# Patient Record
Sex: Female | Born: 1950
Health system: Southern US, Community
[De-identification: ages and names within clinical notes are randomized; demographics above are authoritative.]

## PROBLEM LIST (undated history)

## (undated) DIAGNOSIS — I272 Pulmonary hypertension, unspecified: Secondary | ICD-10-CM

## (undated) DIAGNOSIS — M069 Rheumatoid arthritis, unspecified: Secondary | ICD-10-CM

## (undated) DIAGNOSIS — R112 Nausea with vomiting, unspecified: Secondary | ICD-10-CM

## (undated) DIAGNOSIS — J449 Chronic obstructive pulmonary disease, unspecified: Secondary | ICD-10-CM

## (undated) DIAGNOSIS — M1990 Unspecified osteoarthritis, unspecified site: Secondary | ICD-10-CM

## (undated) DIAGNOSIS — T8859XA Other complications of anesthesia, initial encounter: Secondary | ICD-10-CM

## (undated) DIAGNOSIS — I10 Essential (primary) hypertension: Secondary | ICD-10-CM

## (undated) DIAGNOSIS — T4145XA Adverse effect of unspecified anesthetic, initial encounter: Secondary | ICD-10-CM

## (undated) DIAGNOSIS — Z9889 Other specified postprocedural states: Secondary | ICD-10-CM

## (undated) HISTORY — DX: Pulmonary hypertension, unspecified: I27.20

## (undated) HISTORY — DX: Essential (primary) hypertension: I10

## (undated) HISTORY — PX: TUBAL LIGATION: SHX77

## (undated) HISTORY — DX: Rheumatoid arthritis, unspecified: M06.9

---

## 2003-07-05 HISTORY — PX: LEEP: SHX91

## 2004-07-04 HISTORY — PX: OTHER SURGICAL HISTORY: SHX169

## 2005-07-04 HISTORY — PX: OTHER SURGICAL HISTORY: SHX169

## 2006-08-22 ENCOUNTER — Ambulatory Visit: Payer: Self-pay | Admitting: Family Medicine

## 2006-10-04 ENCOUNTER — Encounter: Payer: Self-pay | Admitting: Family Medicine

## 2006-10-24 ENCOUNTER — Ambulatory Visit: Payer: Self-pay | Admitting: Cardiology

## 2006-11-16 DIAGNOSIS — M069 Rheumatoid arthritis, unspecified: Secondary | ICD-10-CM | POA: Insufficient documentation

## 2006-11-16 DIAGNOSIS — I10 Essential (primary) hypertension: Secondary | ICD-10-CM | POA: Insufficient documentation

## 2006-11-16 DIAGNOSIS — M810 Age-related osteoporosis without current pathological fracture: Secondary | ICD-10-CM

## 2006-11-30 ENCOUNTER — Ambulatory Visit: Payer: Self-pay | Admitting: Family Medicine

## 2006-12-12 ENCOUNTER — Encounter: Payer: Self-pay | Admitting: Family Medicine

## 2007-03-28 ENCOUNTER — Encounter: Payer: Self-pay | Admitting: Family Medicine

## 2007-03-28 ENCOUNTER — Other Ambulatory Visit: Admission: RE | Admit: 2007-03-28 | Discharge: 2007-03-28 | Payer: Self-pay | Admitting: Family Medicine

## 2007-03-28 ENCOUNTER — Ambulatory Visit: Payer: Self-pay | Admitting: Family Medicine

## 2007-03-28 LAB — CONVERTED CEMR LAB
HDL: 69.4 mg/dL (ref >39.0)
KOH Prep: NEGATIVE
VLDL: 11 mg/dL (ref 0–40)
Whiff Test: NEGATIVE

## 2007-04-02 ENCOUNTER — Encounter (INDEPENDENT_AMBULATORY_CARE_PROVIDER_SITE_OTHER): Payer: Self-pay | Admitting: *Deleted

## 2007-04-02 LAB — CONVERTED CEMR LAB: Pap Smear: NORMAL

## 2007-04-03 ENCOUNTER — Encounter (INDEPENDENT_AMBULATORY_CARE_PROVIDER_SITE_OTHER): Payer: Self-pay | Admitting: *Deleted

## 2007-04-19 ENCOUNTER — Encounter: Payer: Self-pay | Admitting: Family Medicine

## 2007-05-01 ENCOUNTER — Encounter: Payer: Self-pay | Admitting: Family Medicine

## 2007-05-05 HISTORY — PX: OTHER SURGICAL HISTORY: SHX169

## 2007-05-08 ENCOUNTER — Encounter: Payer: Self-pay | Admitting: Family Medicine

## 2007-05-08 LAB — HM COLONOSCOPY: HM Colonoscopy: NORMAL

## 2007-05-18 ENCOUNTER — Ambulatory Visit: Payer: Self-pay | Admitting: Gastroenterology

## 2007-07-23 ENCOUNTER — Telehealth (INDEPENDENT_AMBULATORY_CARE_PROVIDER_SITE_OTHER): Payer: Self-pay | Admitting: *Deleted

## 2007-09-06 ENCOUNTER — Encounter: Payer: Self-pay | Admitting: Family Medicine

## 2007-09-12 ENCOUNTER — Encounter (INDEPENDENT_AMBULATORY_CARE_PROVIDER_SITE_OTHER): Payer: Self-pay | Admitting: *Deleted

## 2007-09-12 ENCOUNTER — Encounter: Payer: Self-pay | Admitting: Family Medicine

## 2007-11-07 ENCOUNTER — Telehealth: Payer: Self-pay | Admitting: Family Medicine

## 2008-02-07 ENCOUNTER — Encounter: Payer: Self-pay | Admitting: Family Medicine

## 2008-05-05 ENCOUNTER — Other Ambulatory Visit: Admission: RE | Admit: 2008-05-05 | Discharge: 2008-05-05 | Payer: Self-pay | Admitting: Family Medicine

## 2008-05-05 ENCOUNTER — Encounter: Payer: Self-pay | Admitting: Family Medicine

## 2008-05-05 ENCOUNTER — Ambulatory Visit: Payer: Self-pay | Admitting: Family Medicine

## 2008-05-07 LAB — CONVERTED CEMR LAB
ALT: 21 units/L (ref 0–35)
AST: 25 units/L (ref 0–37)
Basophils Relative: 0.9 % (ref 0.0–3.0)
CO2: 33 meq/L — ABNORMAL HIGH (ref 19–32)
Calcium: 10.3 mg/dL (ref 8.4–10.5)
Chloride: 102 meq/L (ref 96–112)
Creatinine, Ser: 0.9 mg/dL (ref 0.4–1.2)
Eosinophils Relative: 2.8 % (ref 0.0–5.0)
Glucose, Bld: 93 mg/dL (ref 70–99)
Hemoglobin: 15.7 g/dL — ABNORMAL HIGH (ref 12.0–15.0)
Lymphocytes Relative: 35 % (ref 12.0–46.0)
Monocytes Relative: 8.8 % (ref 3.0–12.0)
Neutro Abs: 2.8 10*3/uL (ref 1.4–7.7)
Neutrophils Relative %: 52.5 % (ref 43.0–77.0)
RBC: 4.59 M/uL (ref 3.87–5.11)
TSH: 1.17 microintl units/mL (ref 0.35–5.50)
Total Protein: 7.3 g/dL (ref 6.0–8.3)
Vit D, 1,25-Dihydroxy: 38 (ref 30–89)
WBC: 5.6 10*3/uL (ref 4.5–10.5)

## 2008-07-21 ENCOUNTER — Ambulatory Visit: Payer: Self-pay | Admitting: Family Medicine

## 2008-08-08 ENCOUNTER — Encounter: Payer: Self-pay | Admitting: Family Medicine

## 2008-08-28 ENCOUNTER — Telehealth: Payer: Self-pay | Admitting: Family Medicine

## 2008-09-30 ENCOUNTER — Encounter: Payer: Self-pay | Admitting: Family Medicine

## 2008-10-15 ENCOUNTER — Encounter: Payer: Self-pay | Admitting: Family Medicine

## 2009-01-07 ENCOUNTER — Telehealth: Payer: Self-pay | Admitting: Family Medicine

## 2009-02-06 ENCOUNTER — Encounter: Payer: Self-pay | Admitting: Family Medicine

## 2010-03-15 ENCOUNTER — Ambulatory Visit: Payer: Self-pay | Admitting: Family Medicine

## 2010-03-15 DIAGNOSIS — K648 Other hemorrhoids: Secondary | ICD-10-CM

## 2010-04-06 ENCOUNTER — Encounter: Payer: Self-pay | Admitting: Family Medicine

## 2010-04-13 ENCOUNTER — Encounter: Payer: Self-pay | Admitting: Family Medicine

## 2010-05-12 ENCOUNTER — Telehealth: Payer: Self-pay | Admitting: Family Medicine

## 2010-05-19 ENCOUNTER — Telehealth (INDEPENDENT_AMBULATORY_CARE_PROVIDER_SITE_OTHER): Payer: Self-pay | Admitting: *Deleted

## 2010-05-20 ENCOUNTER — Ambulatory Visit: Payer: Self-pay | Admitting: Family Medicine

## 2010-05-23 LAB — CONVERTED CEMR LAB
Alkaline Phosphatase: 54 units/L (ref 39–117)
BUN: 11 mg/dL (ref 6–23)
Basophils Absolute: 0 10*3/uL (ref 0.0–0.1)
Basophils Relative: 0.7 % (ref 0.0–3.0)
Bilirubin, Direct: 0.1 mg/dL (ref 0.0–0.3)
CO2: 29 meq/L (ref 19–32)
Calcium: 9.5 mg/dL (ref 8.4–10.5)
Chloride: 100 meq/L (ref 96–112)
Cholesterol: 187 mg/dL (ref 0–200)
Creatinine, Ser: 0.9 mg/dL (ref 0.4–1.2)
Eosinophils Absolute: 0.1 10*3/uL (ref 0.0–0.7)
HDL: 72.5 mg/dL (ref >39.00)
LDL Cholesterol: 105 mg/dL — ABNORMAL HIGH (ref 0–99)
Lymphocytes Relative: 23.7 % (ref 12.0–46.0)
MCHC: 34.5 g/dL (ref 30.0–36.0)
MCV: 96.2 fL (ref 78.0–100.0)
Monocytes Absolute: 0.4 10*3/uL (ref 0.1–1.0)
Neutrophils Relative %: 64.3 % (ref 43.0–77.0)
Platelets: 175 10*3/uL (ref 150.0–400.0)
RBC: 4.49 M/uL (ref 3.87–5.11)
Total Bilirubin: 0.4 mg/dL (ref 0.3–1.2)
Total CHOL/HDL Ratio: 3
Triglycerides: 47 mg/dL (ref 0.0–149.0)
VLDL: 9.4 mg/dL (ref 0.0–40.0)
WBC: 5 10*3/uL (ref 4.5–10.5)

## 2010-06-08 ENCOUNTER — Encounter: Payer: Self-pay | Admitting: Family Medicine

## 2010-06-08 ENCOUNTER — Ambulatory Visit: Payer: Self-pay | Admitting: Family Medicine

## 2010-06-11 ENCOUNTER — Other Ambulatory Visit
Admission: RE | Admit: 2010-06-11 | Discharge: 2010-06-11 | Payer: Self-pay | Source: Home / Self Care | Admitting: Family Medicine

## 2010-06-11 ENCOUNTER — Ambulatory Visit: Payer: Self-pay | Admitting: Family Medicine

## 2010-06-11 LAB — CONVERTED CEMR LAB
Cholesterol, target level: 200 mg/dL
HDL goal, serum: 40 mg/dL

## 2010-06-14 ENCOUNTER — Encounter: Payer: Self-pay | Admitting: Family Medicine

## 2010-06-14 ENCOUNTER — Telehealth: Payer: Self-pay | Admitting: Family Medicine

## 2010-06-14 ENCOUNTER — Encounter (INDEPENDENT_AMBULATORY_CARE_PROVIDER_SITE_OTHER): Payer: Self-pay | Admitting: *Deleted

## 2010-06-17 DIAGNOSIS — R87610 Atypical squamous cells of undetermined significance on cytologic smear of cervix (ASC-US): Secondary | ICD-10-CM

## 2010-06-17 LAB — CONVERTED CEMR LAB: Pap Smear: UNDETERMINED

## 2010-07-26 ENCOUNTER — Encounter: Payer: Self-pay | Admitting: Family Medicine

## 2010-08-03 NOTE — Progress Notes (Signed)
Summary: needs order for mammogram  Phone Note Call from Patient   Caller: Patient Call For: Allena Earing MD Summary of Call: Pt has appt to see you 11/22 and is asking for an order for mammogram either before or after appt.  She goes to [facility]. Initial call taken by: Marty Heck CMA, AAMA,  May 12, 2010 12:28 PM  Follow-up for Phone Call        let her know that we are making decision no longer to use [facility] due to problems getting reports and some other issues  Rosaria Ferries will disc other choices with her will do ref  Follow-up by: Allena Earing MD,  May 12, 2010 1:08 PM  Additional Follow-up for Phone Call Additional follow up Details #1::        Left message for patient to call back. Ozzie Hoyle LPN  May 12, 1024 4:53 PM   I got message from Rosaria Ferries - that we need dx instead of screen mam - I will change the order     Additional Follow-up for Phone Call Additional follow up Details #2::    Appt at [facility] on 06/08/2010 at 9:30, order faxed and mailed to the patient. Follow-up by: Haynes Bast,  May 13, 2010 2:54 PM

## 2010-08-03 NOTE — Letter (Signed)
Summary: [facility]  [facility]   Imported By: Edmonia James 04/22/2010 12:27:15  _____________________________________________________________________  External Attachment:    Type:   Image     Comment:   External Document

## 2010-08-03 NOTE — Progress Notes (Signed)
----   Converted from flag ---- ---- 05/18/2010 11:06 PM, Carmell Austria Tower MD wrote: please check lipid and wellness v70.0, 401.1 thanks   ---- 05/18/2010 8:04 AM, Daralene Milch CMA (AAMA) wrote: Lab orders please! Good Morning! This pt is scheduled for cpx labs [place], which labs to draw and dx codes to use? Thanks Tasha ------------------------------

## 2010-08-03 NOTE — Letter (Signed)
Summary: [facility]  [facility]   Imported By: Edmonia James 02/18/2009 14:41:49  _____________________________________________________________________  External Attachment:    Type:   Image     Comment:   External Document

## 2010-08-03 NOTE — Assessment & Plan Note (Signed)
Summary: BLOOD IN STOOL   Vital Signs:  Patient profile:   60 year old female Height:      66 inches Weight:      120 pounds BMI:     19.44 Temp:     97.9 degrees F oral Pulse rate:   72 / minute Pulse rhythm:   regular BP sitting:   138 / 82  (left arm) Cuff size:   regular  Vitals Entered By: Ozzie Hoyle LPN (March 15, 6225 12:47 PM) CC: Bright red blood and mucus in loose stools.   History of Present Illness: is having problems with blood and mucous in stools  sometimes when she has a bm -- is solid and other times is diarrhea and a lot of mucous and brb  1-2 weeks ago  no nausea or fever  a bit of stomach pain for 2 days --was over on the L side -- gas ex relieved it   has not eaten out a lot  no one else at home has had it   blood is small amt bright red -- just with bm and occ with gas just little streaks on the stool and when she wipes (not coloring toilet water red)  has hx of hemorroids -- they are popped out   used to volunteer in hospital 3 mo ago   stressed -- lost job for several mo and 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 scleroderma  Past Surgical History: Last updated: 05/24/2007 Tubal ligation LEEP (2005) Surgery- tendon release in hand (2006) Dexa- OP hip -3.0 (07/2005) colonosc neg 11/08  Family History: Last updated: 05/05/2008 Father: RA, macular degeneration, HTN-? etoh, early demtentia  Mother: Heart disease- deceased. 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 Eyes:  Denies blurring and eye pain. CV:  Denies chest pain or discomfort, lightheadness, near fainting, and palpitations. Resp:  Denies cough, shortness of breath, sputum productive, and wheezing. GI:  Complains of abdominal pain, bloody stools, change in bowel habits, and gas; denies indigestion, loss of appetite, nausea, and vomiting. GU:  Denies hematuria. Derm:  Denies itching, poor wound healing, and rash. Neuro:  Denies headaches. Endo:  Denies cold intolerance and heat intolerance. Heme:  Denies abnormal bruising and bleeding.  Physical Exam  General:  slim and well appearing  Head:  normocephalic, atraumatic, and no abnormalities observed.   Eyes:  vision grossly intact, pupils equal, pupils round, and pupils reactive to light.  no conjunctival pallor, injection or icterus  Mouth:  pharynx pink and moist.   Neck:  No deformities, masses, or tenderness noted. Lungs:  Normal respiratory effort, chest expands symmetrically. Lungs are clear to auscultation, no crackles or wheezes. Heart:  Normal rate and regular rhythm. S1 and S2 normal without gallop, murmur, click, rub or other extra sounds. Abdomen:  Bowel sounds positive,abdomen soft and non-tender without masses, organomegaly or hernias noted. Rectal:  nl tone with heme neg stool int hemorrhoid with mild proctitis at 1-2 :00 position on  anoscopy (non thrombosed and not actively bleeding )  Msk:  No deformity or scoliosis noted of thoracic or lumbar spine.   Skin:  Intact without suspicious lesions or rashes Cervical Nodes:  No lymphadenopathy noted Inguinal Nodes:  No significant adenopathy Psych:  normal affect, talkative and pleasant    Impression & Recommendations:  Problem # 1:  HEMORRHOIDS, INTERNAL, WITH BLEEDING (ICD-455.2) Assessment New  brb in stool after straining - streaks on anoscopy non thrombosed / non bleeding  int hem seen tx with anusol hc suppos  update if not imp  heme neg stool today utd colonosc also adv probiotic and adv   Orders: Prescription Created Electronically [phone]) Anoscopy (16010)  Problem # 2:  HYPERTENSION (ICD-401.9) Assessment: Unchanged  this remains in fair control with meds disc good health habits  refils done Her updated medication list for this problem includes:    Cardizem Cd 180 Mg Cp24 (Diltiazem hcl coated beads) .Marland Kitchen... Take one by mouth daily    Hydrochlorothiazide 25 Mg Tabs (Hydrochlorothiazide) .Marland Kitchen... Take one by mouth daily  BP today: 138/82 Prior BP: 120/80 (07/21/2008)  Labs Reviewed: K+: 3.6 (05/05/2008) Creat: : 0.9 (05/05/2008)   Chol: 185 (03/28/2007)   HDL: 69.4 (03/28/2007)   LDL: 104 (03/28/2007)   TG: 57 (03/28/2007)  Orders: Prescription Created Electronically [phone])  Complete Medication List: 1)  Enbrel 25 Mg Kit (Etanercept) .Marland Kitchen.. 1 injection  per week 2)  Cardizem Cd 180 Mg Cp24 (Diltiazem hcl coated beads) .... Take one by mouth daily 3)  Hydrochlorothiazide 25 Mg Tabs (Hydrochlorothiazide) .... Take one by mouth daily 4)  Hydroxychloroquine Sulfate 200 Mg Tabs (Hydroxychloroquine sulfate) .... One by mouth daily 5)  Adult Aspirin Low Strength 81 Mg Tbdp (Aspirin) .... One by mouth daily 6)  Vitamin D3 2000 Unit Caps (Cholecalciferol) .... Take 1 capsule by mouth once a day 7)  Gas-x 80 Mg Chew (Simethicone) .... Otc as directed. 8)  Anusol-hc 25 Mg Supp (Hydrocortisone acetate) .Marland Kitchen.. 1 suppository per rectum at bedtime for 10 days  Patient Instructions: 1)  buy align otc and take as directed for 2 weeks (probiotic)  2)  update me if mucous in stool does not improve  3)  use the anusol hc suppository at bedtime for 10 days 4)  udpate me if bleeding continues 5)  avoid straining  Prescriptions: HYDROCHLOROTHIAZIDE 25 MG TABS (HYDROCHLOROTHIAZIDE) Take one by mouth daily  #90 x 3   Entered and Authorized by:   Allena Earing MD    Signed by:   Allena Earing MD on 03/15/2010   Method used:   Print then Give to Patient   RxID:   [id number] CARDIZEM CD 180 MG CP24 (DILTIAZEM HCL COATED BEADS) Take one by mouth daily  #90 x 3   Entered and Authorized by:   Allena Earing MD   Signed by:   Allena Earing MD on 03/15/2010   Method used:   Print then Give to Patient   RxID:   [id number] ANUSOL-HC 25 MG SUPP (HYDROCORTISONE ACETATE) 1 suppository per rectum at bedtime for 10 days  #10 x 1   Entered and Authorized by:   Allena Earing MD   Signed by:   Allena Earing MD on 03/15/2010   Method used:   Electronically to        [facility]. [phone]* (retail)       [place]  [place], [place]  [address]       Ph: [phone]       Fax: [phone]   RxID:   [id number]   Current Allergies (reviewed today): No known allergies

## 2010-08-03 NOTE — Miscellaneous (Signed)
Summary: refil fosamax  Clinical Lists Changes  Medications: Rx of FOSAMAX 70 MG TABS (ALENDRONATE SODIUM) Take one by mouth weekly;  #3 month x 3;  Signed;  Entered by: Allena Earing MD;  Authorized by: Carmell Austria Kadejah Sandiford MD;  Method used: Printed then faxed to    Prescriptions: FOSAMAX 70 MG TABS (ALENDRONATE SODIUM) Take one by mouth weekly  #3 month x 3   Entered and Authorized by:   Allena Earing MD   Signed by:   Allena Earing MD on 02/07/2008   Method used:   Printed then faxed to ...         RxID:   [id number]

## 2010-08-03 NOTE — Consult Note (Signed)
Summary: Consultation Report-rheumatology  Consultation Report   Imported By: Genice Rouge 12/21/2006 15:50:18  _____________________________________________________________________  External Attachment:    Type:   Image     Comment:   External Document

## 2010-08-05 NOTE — Miscellaneous (Signed)
  Clinical Lists Changes  Observations: Added new observation of MAMMO DUE: 06/2011 (06/14/2010 13:44) Added new observation of MAMMOGRAM: normal (06/08/2010 13:45)      Preventive Care Screening  Mammogram:    Date:  06/08/2010    Next Due:  06/2011    Results:  normal

## 2010-08-05 NOTE — Assessment & Plan Note (Signed)
Summary: CPX/CLE  r/s 05/25/10   Vital Signs:  Patient profile:   60 year old female Height:      66 inches Weight:      119.75 pounds BMI:     19.40 Temp:     97.9 degrees F oral Pulse rate:   72 / minute Pulse rhythm:   regular BP sitting:   130 / 78  (left arm) Cuff size:   regular  Vitals Entered By: Ozzie Hoyle LPN (June 12, 9023 12:18 PM) CC: CPX with pap and breast exam LMP 15 yrs ago, Lipid Management   History of Present Illness: here for wellness and gyn exam and to rev chronic problems  wt is stable  feeling ok in general  no new medical changes  bmi is 19  still takes align for ibs - and this seems to help -- will continue this    HTN is fairly controlled with bp of 130/70 today   hx of leep in past last pap 09  no gyn problems   colonosc 08 nl  12/11 nl mam -- result just returned  self exam - no lumps or changes   has OP dexa 09- due for 2 y check-- had issues with insurance company -- finally will pay  ca and D  labs -gluc was 108 no thirst  no wt loss  does crave some sweets but does not eat them  some DM on mothers side of family    lipids stable Last Lipid ProfileCholesterol: 187 (05/20/2010 8:28:40 AM)HDL:  72.50 (05/20/2010 8:28:40 AM)LDL:  105 (05/20/2010 8:28:40 AM)Triglycerides:  Last Liver profileSGOT:  20 (05/20/2010 8:28:40 AM)SPGT:  14 (05/20/2010 8:28:40 AM)T. Bili:  0.4 (05/20/2010 8:28:40 AM)Alk Phos:  54 (05/20/2010 8:28:40 AM)     Lipid Management History:      Positive NCEP/ATP III risk factors include female age 39 years old or older and hypertension.  Negative NCEP/ATP III risk factors include HDL cholesterol greater than 60 and non-tobacco-user status.    Allergies (verified): No Known Drug Allergies  Past History:  Past Medical History: Last updated: 03/28/2007 Hypertension Osteoporosis Rheumatoid arthritis- ? scleroderma  Past Surgical History: Last updated: 05/24/2007 Tubal ligation LEEP  (2005) Surgery- tendon release in hand (2006) Dexa- OP hip -3.0 (07/2005) colonosc neg 11/08  Family History: Last updated: 05/05/2008 Father: RA, macular degeneration, HTN-? etoh, early demtentia  Mother: Heart disease- deceased. 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 Eyes:  Denies blurring and eye irritation. CV:  Denies chest pain or discomfort, palpitations, and shortness of breath with exertion. Resp:  Denies cough and shortness of breath. GI:  Denies abdominal pain, bloody stools, change in bowel habits, and indigestion; IBS is a little better . GU:  Denies urinary frequency. MS:  Denies joint pain, joint redness, and joint swelling. Derm:  Denies itching, lesion(s), poor wound healing, and rash. Neuro:  Denies numbness and tingling. Endo:  Denies cold intolerance, excessive thirst, excessive urination, and heat intolerance. Heme:  Denies abnormal bruising and bleeding.  Physical Exam  General:  Well-developed,well-nourished,in no acute distress; alert,appropriate and cooperative throughout examination Head:  normocephalic, atraumatic, and no abnormalities observed.   Eyes:  vision grossly intact, pupils equal, pupils  round, and pupils reactive to light.  no conjunctival pallor, injection or icterus  Ears:  R ear normal and L ear normal.   Nose:  no nasal discharge.   Mouth:  pharynx pink and moist.   Neck:  No deformities, masses, or tenderness noted. Chest Wall:  No deformities, masses, or tenderness noted. Breasts:  No mass, nodules, thickening, tenderness, bulging, retraction, inflamation, nipple discharge or skin changes noted.   Lungs:  Normal respiratory  effort, chest expands symmetrically. Lungs are clear to auscultation, no crackles or wheezes. Heart:  Normal rate and regular rhythm. S1 and S2 normal without gallop, murmur, click, rub or other extra sounds. Abdomen:  Bowel sounds positive,abdomen soft and non-tender without masses, organomegaly or hernias noted. no renal bruits  Genitalia:  Normal introitus for age, no external lesions, no vaginal discharge, mucosa pink and moist, no vaginal or cervical lesions, no vaginal atrophy, no friaility or hemorrhage, normal uterus size and position, no adnexal masses or tenderness Msk:  No deformity or scoliosis noted of thoracic or lumbar spine.  no acute joint changes  Pulses:  R and L carotid,radial,femoral,dorsalis pedis and posterior tibial pulses are full and equal bilaterally Extremities:  No clubbing, cyanosis, edema, or deformity noted with normal full range of motion of all joints.   Neurologic:  sensation intact to light touch, sensation intact to pinprick, and gait normal.   Skin:  Intact without suspicious lesions or rashes Cervical Nodes:  No lymphadenopathy noted Axillary Nodes:  No palpable lymphadenopathy Inguinal Nodes:  No significant adenopathy Psych:  normal affect, talkative and pleasant    Impression & Recommendations:  Problem # 1:  HEALTH MAINTENANCE EXAM (ICD-V70.0) Assessment Comment Only reviewed health habits including diet, exercise and skin cancer prevention reviewed health maintenance list and family history reviewed labs with pt in detail Td today  Problem # 2:  GYNECOLOGICAL EXAMINATION, ROUTINE (ICD-V72.31) Assessment: Comment Only annual exam with pap done and no complaints  Problem # 3:  HYPERTENSION (ICD-401.9) Assessment: Unchanged  bp is well controlled  no change in med rev labs rev lifestyle habits Her updated medication list for this problem includes:    Cardizem Cd 180 Mg Cp24 (Diltiazem hcl coated beads) .Marland Kitchen... Take one by mouth daily     Hydrochlorothiazide 25 Mg Tabs (Hydrochlorothiazide) .Marland Kitchen... Take one by mouth daily  BP today: 130/78 Prior BP: 138/82 (03/15/2010)  Labs Reviewed: K+: 3.8 (05/20/2010) Creat: : 0.9 (05/20/2010)   Chol: 187 (05/20/2010)   HDL: 72.50 (05/20/2010)   LDL: 105 (05/20/2010)   TG: 47.0 (05/20/2010)  Problem # 4:  OSTEOPOROSIS (ICD-733.00) Assessment: Unchanged due for dexa - will schedule continue ca and D mild kyphosis  work on exercise Her updated medication list for this problem includes:    Vitamin D3 2000 Unit Caps (Cholecalciferol) .Marland Kitchen... Take 1 capsule by mouth once a day  Orders: Radiology Referral (Radiology)  Complete Medication List: 1)  Enbrel 25 Mg Kit (Etanercept) .Marland Kitchen.. 1 injection  per week 2)  Cardizem Cd 180 Mg Cp24 (Diltiazem hcl coated beads) .... Take one by mouth daily 3)  Hydrochlorothiazide 25 Mg Tabs (Hydrochlorothiazide) .... Take one by mouth daily 4)  Hydroxychloroquine Sulfate 200 Mg Tabs (Hydroxychloroquine sulfate) .... One by mouth daily 5)  Adult Aspirin Low Strength 81 Mg Tbdp (Aspirin) .... One by mouth daily 6)  Vitamin D3 2000 Unit Caps (Cholecalciferol) .... Take 1 capsule by mouth once a day 7)  Gas-x 80 Mg Chew (Simethicone) .... Otc as directed. 8)  Anusol-hc 25 Mg Supp (Hydrocortisone acetate) .Marland Kitchen.. 1 suppository per rectum at bedtime for 10 days as needed  Other Orders: TD Toxoids IM 7 YR + (18550) Admin 1st Vaccine (15868)  Lipid Assessment/Plan:      Based on NCEP/ATP III, the patient's risk factor category is "0-1 risk factors".  The patient's lipid goals are as follows: Total cholesterol goal is 200; LDL cholesterol goal is 160; HDL cholesterol goal is 40; Triglyceride goal is 150.     Patient Instructions: 1)  tetnus shot today  2)  labs look ok but watch sugar in your diet  3)  we will schedule bone density test at check out  4)  no change in medicine   Orders Added: 1)  TD Toxoids IM 7 YR + [90714] 2)  Admin 1st Vaccine  [90471] 3)  Radiology Referral [Radiology] 4)  Est. Patient 40-64 years [25749]   Immunization History:  Influenza Immunization History:    Influenza:  historical (04/03/2010)  Immunizations Administered:  Tetanus Vaccine:    Vaccine Type: Td    Site: left deltoid    Mfr: Sanofi Pasteur    Dose: 0.5 ml    Route: IM    Given by: Ozzie Hoyle LPN    Exp. Date: 10/21/2011    Lot #: [id number]    VIS given: 05/21/08 version given June 11, 2010.   Immunization History:  Influenza Immunization History:    Influenza:  Historical (04/03/2010)  Immunizations Administered:  Tetanus Vaccine:    Vaccine Type: Td    Site: left deltoid    Mfr: Sanofi Pasteur    Dose: 0.5 ml    Route: IM    Given by: Ozzie Hoyle LPN    Exp. Date: 10/21/2011    Lot #: [id number]    VIS given: 05/21/08 version given June 11, 2010.  Current Allergies (reviewed today): No known allergies    Preventive Care Screening  Mammogram:    Date:  06/08/2010    Results:  normal

## 2010-08-05 NOTE — Letter (Signed)
Summary: Results Follow up Letter  [facility]  [address] [place], [place] [address]   Phone: [phone]  Fax: [phone]    06/14/2010 MRN: [id number]  Carrie Hendricks [address] [place],   [address]  Dear [name],  The following are the results of your recent test(s):  Test         Result    Pap Smear:        Normal _____  Not Normal _____ Comments: ______________________________________________________ Cholesterol: LDL(Bad cholesterol):         Your goal is less than:         HDL (Good cholesterol):       Your goal is more than: Comments:  ______________________________________________________ Mammogram:        Normal __X__  Not Normal _____ Comments:Repeat in 1 year  ___________________________________________________________________ Hemoccult:        Normal _____  Not normal _______ Comments:    _____________________________________________________________________ Other Tests:    We routinely do not discuss normal results over the telephone.  If you desire a copy of the results, or you have any questions about this information we can discuss them at your next office visit.   Sincerely,      Sheldon Silvan for  Dr. Loura Pardon

## 2010-08-05 NOTE — Progress Notes (Signed)
  Phone Note Call from Patient   Caller: Patient Summary of Call: Called patient to set up Dexa Scan. She wants to go thru [facility] and she wants Dr Precious Reel to order it for her. I removed the order.  Initial call taken by: Haynes Bast,  June 14, 2010 8:19 AM  Follow-up for Phone Call        thanks for the update- that is fine Follow-up by: Allena Earing MD,  June 14, 2010 8:47 AM

## 2010-08-19 NOTE — Letter (Signed)
Summary: [facility]  [facility]   Imported By: Laural Benes 08/06/2010 13:58:36  _____________________________________________________________________  External Attachment:    Type:   Image     Comment:   External Document

## 2010-08-23 ENCOUNTER — Encounter: Payer: Self-pay | Admitting: Family Medicine

## 2010-09-06 ENCOUNTER — Telehealth: Payer: Self-pay | Admitting: Family Medicine

## 2010-09-14 NOTE — Letter (Signed)
Summary: [facility]   Imported By: Jamelle Haring 08/31/2010 10:12:50  _____________________________________________________________________  External Attachment:    Type:   Image     Comment:   External Document

## 2010-09-14 NOTE — Progress Notes (Signed)
Summary: call a nurse   Phone Note Call from Patient   Caller: Patient Summary of Call: Triage Record Num: [id number] Operator: Hughes Better Patient Name: Carrie Hendricks Call Date & Time: 09/04/2010 2:07:32PM Patient Phone: [phone] PCP: Wynelle Fanny. Stormi Vandevelde Patient Gender: Female PCP Fax : Patient DOB: 11/21/1948 Practice Name: [facility] Reason for Call: Follow up call: RN spoke to Jennifer/Daughter reports that patient is not vomiting. RN spoke to patient and she is able to tolerate ice chips. Patient has tolerated Phenergan supp x 1. Patient reports no nausea and "feels" much better. RN advised patient to call back anytime. Protocol(s) Used: Office Note Recommended Outcome per Protocol: Information Noted and Sent to Office Reason for Outcome: Caller information to office Care Advice:  ~ 03/ Initial call taken by: Lacretia Nicks,  September 06, 2010 8:29 AM

## 2010-11-18 ENCOUNTER — Other Ambulatory Visit: Payer: Self-pay

## 2010-11-18 MED ORDER — HYDROCHLOROTHIAZIDE 25 MG PO TABS: 25.0000 mg | ORAL_TABLET | Freq: Every day | ORAL | Status: DC

## 2010-11-18 NOTE — Telephone Encounter (Signed)
Completed [employer] form faxed to [phone] as instructed.

## 2010-11-19 NOTE — Assessment & Plan Note (Signed)
[facility] OFFICE NOTE   ADISA, VIGEANT                      MRN:          [id number]  DATE:10/24/2006                            DOB:          [birth year]    ADDENDUM:  Review of Dr. Scharlene Gloss followup notes suggest that she has  positive scleroderma antibody and anti-CCP antibodies.  She also had a  positive FANA.  Question scleroderma or mixed connective tissue disorder  has been raised concerning her hands and acrocyanosis.  I agree with a  calcium channel blocker, to which he has changed her.     Thomas C. Verl Blalock, MD, [facility]     TCW/MedQ  DD: 10/24/2006  DT: 10/24/2006  Job #: [id number]   cc:   Percell Boston  Dr. Theodosia Blender A. [name], MD

## 2010-11-19 NOTE — Assessment & Plan Note (Signed)
[facility] OFFICE NOTE   Carrie Hendricks, Carrie Hendricks                      MRN:          [id number]  DATE:10/24/2006                            DOB:          20-Sep-1950    HISTORY OF PRESENT ILLNESS:  I was asked by Dr. [name] to evaluate Carrie Hendricks, a delightful 60 year old divorced white female, recently new  to the area from [place] with dyspnea on exertion.   She noted this back in the fall, but this screen, with increased  activity, it has been worse.  She denies any chest tightness, pressure  or symptoms otherwise of ischemia.   She has a heavy smoking history, having smoked for 35 years which  equates to about a 50-pack year smoking history at a pack and a half a  day.  She quit in August 2007.   She has also been plagued by some allergic symptoms since she moved back  to the [place].   She has been evaluated by Dr. Cristi Loron who has followed her in  the distant past for rheumatoid arthritis.  He saw her on several  occasions lasting October 04, 2006.  At that time, she had a chest x-ray  and an echocardiogram was recommended.  The x-ray apparently was normal  per the patient, but we are awaiting to receive a copy.  When he  initially saw her, he diagnosed erosive rheumatoid arthritis, suspected  a component of Berger's disease with some acrocyanosis.  Hand x-rays  definitely showed destructive atrophy of the radiocarpal space on the  right periarticular osteopenia, resected distal ulnar on the left with a  fused left thumb MCP.  Inflammatory markers were actually low with a sed  rate of 9.  Her CBC was normal with hemoglobin 14.9.  Comprehensive  metabolic panel was also normal except for an elevated calcium of 10.4.  CPK was normal.  TSH was normal.   ALLERGIES:  SHE IS NOT ALLERGIC TO ANY DYE.  SHE HAS NO OTHER DRUG  ALLERGIES.   MEDICATIONS:  1. Enbrel 25 mg two times weekly.  2. Fosamax 70  mg weekly.  3. Cardia XT 180 mg daily.  4. Hydroxychloroquine 200 mg once daily (which is Plaquenil).  5. Calcium supplements 600 and vitamin D daily.  6. Omega-3 fish oil 1000 mg daily.  7. Enteric coated aspirin 81 mg daily.  8. Cardia XT recently started as well in placed of Atenolol because of      the acrocyanosis.   PAST SURGICAL HISTORY:  1. Left hand surgery as noted above.  2. Right foot surgery for her rheumatoid arthritis and deformed      joints.   FAMILY HISTORY:  Noncontributory for premature coronary disease.   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  She has three children and two grandchildren.  The  children and grandchildren have brought her to this area.  She is  divorced.   REVIEW OF SYSTEMS:  Other than the HPI is remarkable for chronic fatigue  and history of high blood pressure.  The rest of review of systems are  negative.   PHYSICAL EXAMINATION:  VITAL SIGNS:  Blood pressure 146/80, pulse 63 and  regular.  She is 5 feet 7 and weighs 129 pounds.  HEENT:  Normocephalic, atraumatic.  PERRLA.  Extraocular movements  intact.  Sclerae are injected.  Facial symmetry is normal.  Dentition  satisfactory.  NECK:  Supple.  Carotid upstrokes are equal bilaterally without bruits.  There is no JVD.  There is no thyromegaly.  There is no obvious  lymphadenopathy.  SKIN:  Warm and dry.  HEENT:  Unremarkable.  NECK:  No JVD.  Carotid upstrokes are equal bilaterally without bruits.  LUNGS:  Decreased breath sounds throughout without adventitial sounds or  rhonchi or wheezes.  HEART:  Nondisplaced PMI.  She has a normal S1, S2.  ABDOMEN:  Soft, good bowel sounds.  No midline bruit.  No hepatomegaly.  No obvious organomegaly.  EXTREMITIES:  Acrocyanosis and cool digits.  There is no edema.  Peripheral pulses including radial, ulnar, dorsalis pedis and posterior  tibialis are 2+/4+.  She has some varicose veins.  She has  previous  surgical scars of her extremities as noted above.  NEUROLOGICAL:  Grossly intact.  No sign or DVT as well.  SKIN:  Unremarkable, otherwise.   STUDIES:  EKG is completely normal.   ASSESSMENT:  1. Dyspnea on exertion, mostly likely pulmonary from long standing      tobacco use.  There may be a component of rheumatoid lung disease,      but I doubt it, particularly with her sed rate being so low.  2. Hypertension.  3. Acrocyanosis, probably secondary to her rheumatoid disease, but      also could be related to heavy smoking history.  Hopefully, this      will help now that she has stopped.  There is a question of history      of Raynaud's syndrome.   PLAN:  1. A 2D echocardiogram to rule out any structural heart disease, and      particularly look at right sided function and pulmonary pressures.  2. She may need elevation or increase in her Cardia XT.  We will see      what her blood pressure is on next visit.  3. Continue not to smoke.  4. Pulmonary function studies with DLCO and arterial blood gases.  5. Pulmonary consultation which I will arrange through [facility]   I will follow up with her in about four weeks to review all of this.   ADDENDUM:  Review of Dr. Scharlene Gloss followup notes suggest that she has  positive scleroderma antibody and anti-CCP antibodies.  She also had a  positive FANA.  Question scleroderma or mixed connective tissue disorder  has been raised concerning her hands and acrocyanosis.  I agree with a  calcium channel blocker, to which he has changed her.     Thomas C. Verl Blalock, MD, [facility]  Electronically Signed    TCW/MedQ  DD: 10/24/2006  DT: 10/24/2006  Job #: [id number]   cc:   Marne A. [name], MD  Cristi Loron, M.D.  Collene Gobble, MD

## 2010-12-17 ENCOUNTER — Encounter: Payer: Self-pay | Admitting: Cardiovascular Disease

## 2011-04-07 ENCOUNTER — Encounter: Payer: Self-pay | Admitting: Family Medicine

## 2011-07-03 ENCOUNTER — Telehealth: Payer: Self-pay | Admitting: Family Medicine

## 2011-07-03 DIAGNOSIS — M810 Age-related osteoporosis without current pathological fracture: Secondary | ICD-10-CM

## 2011-07-03 DIAGNOSIS — Z0000 Encounter for general adult medical examination without abnormal findings: Secondary | ICD-10-CM

## 2011-07-03 NOTE — Telephone Encounter (Signed)
Message copied by Abner Greenspan on Sun Jul 03, 2011 12:47 PM ------      Message from: Ellamae Sia      Created: Wed Jun 29, 2011 11:21 AM      Regarding: lab orders for 07-06-10       Patient is scheduled for CPX labs, please order future labs, Thanks , Karna Christmas

## 2011-07-04 ENCOUNTER — Other Ambulatory Visit: Payer: Self-pay | Admitting: Family Medicine

## 2011-07-04 DIAGNOSIS — Z0000 Encounter for general adult medical examination without abnormal findings: Secondary | ICD-10-CM

## 2011-07-04 DIAGNOSIS — M810 Age-related osteoporosis without current pathological fracture: Secondary | ICD-10-CM

## 2011-07-04 NOTE — Progress Notes (Signed)
Addended by: Ellamae Sia on: 07/04/2011 10:11 AM   Modules accepted: Orders

## 2011-07-07 ENCOUNTER — Other Ambulatory Visit (INDEPENDENT_AMBULATORY_CARE_PROVIDER_SITE_OTHER): Payer: PRIVATE HEALTH INSURANCE

## 2011-07-07 DIAGNOSIS — M810 Age-related osteoporosis without current pathological fracture: Secondary | ICD-10-CM

## 2011-07-07 DIAGNOSIS — Z0000 Encounter for general adult medical examination without abnormal findings: Secondary | ICD-10-CM

## 2011-07-07 LAB — LIPID PANEL
HDL: 65 mg/dL (ref >39)
LDL Cholesterol: 108 mg/dL — ABNORMAL HIGH (ref 0–99)
Triglycerides: 58 mg/dL (ref <150)
VLDL: 12 mg/dL (ref 0–40)

## 2011-07-07 LAB — CBC WITH DIFFERENTIAL/PLATELET
Basophils Absolute: 0 10*3/uL (ref 0.0–0.1)
Basophils Relative: 1 % (ref 0–1)
Eosinophils Absolute: 0.2 10*3/uL (ref 0.0–0.7)
Eosinophils Relative: 3 % (ref 0–5)
HCT: 42.6 % (ref 36.0–46.0)
MCH: 33.4 pg (ref 26.0–34.0)
MCHC: 34.3 g/dL (ref 30.0–36.0)
MCV: 97.5 fL (ref 78.0–100.0)
Monocytes Absolute: 0.4 10*3/uL (ref 0.1–1.0)
Platelets: 240 10*3/uL (ref 150–400)
RDW: 13 % (ref 11.5–15.5)
WBC: 5.2 10*3/uL (ref 4.0–10.5)

## 2011-07-07 LAB — COMPREHENSIVE METABOLIC PANEL
ALT: 14 U/L (ref 0–35)
AST: 17 U/L (ref 0–37)
CO2: 28 mEq/L (ref 19–32)
Creat: 0.77 mg/dL (ref 0.50–1.10)
Sodium: 142 mEq/L (ref 135–145)
Total Bilirubin: 1.1 mg/dL (ref 0.3–1.2)
Total Protein: 6.5 g/dL (ref 6.0–8.3)

## 2011-07-07 NOTE — Progress Notes (Signed)
Addended by: Marchia Bond on: 07/07/2011 08:28 AM   Modules accepted: Orders

## 2011-07-08 LAB — VITAMIN D 25 HYDROXY (VIT D DEFICIENCY, FRACTURES): Vit D, 25-Hydroxy: 62 ng/mL (ref 30–89)

## 2011-07-11 ENCOUNTER — Ambulatory Visit (INDEPENDENT_AMBULATORY_CARE_PROVIDER_SITE_OTHER): Payer: PRIVATE HEALTH INSURANCE | Admitting: Family Medicine

## 2011-07-11 ENCOUNTER — Telehealth: Payer: Self-pay

## 2011-07-11 ENCOUNTER — Encounter: Payer: Self-pay | Admitting: Family Medicine

## 2011-07-11 ENCOUNTER — Other Ambulatory Visit (HOSPITAL_COMMUNITY)
Admission: RE | Admit: 2011-07-11 | Discharge: 2011-07-11 | Disposition: A | Discharge status: Home / Self Care | Payer: PRIVATE HEALTH INSURANCE | Source: Ambulatory Visit | Attending: Family Medicine | Admitting: Family Medicine

## 2011-07-11 VITALS — BP 120/76 | HR 72 | Temp 97.9°F | Ht 65.5 in | Wt 118.5 lb

## 2011-07-11 DIAGNOSIS — Z01419 Encounter for gynecological examination (general) (routine) without abnormal findings: Secondary | ICD-10-CM | POA: Insufficient documentation

## 2011-07-11 DIAGNOSIS — I10 Essential (primary) hypertension: Secondary | ICD-10-CM

## 2011-07-11 DIAGNOSIS — Z0000 Encounter for general adult medical examination without abnormal findings: Secondary | ICD-10-CM

## 2011-07-11 DIAGNOSIS — R87610 Atypical squamous cells of undetermined significance on cytologic smear of cervix (ASC-US): Secondary | ICD-10-CM

## 2011-07-11 DIAGNOSIS — M810 Age-related osteoporosis without current pathological fracture: Secondary | ICD-10-CM

## 2011-07-11 DIAGNOSIS — R87810 Cervical high risk human papillomavirus (HPV) DNA test positive: Secondary | ICD-10-CM | POA: Insufficient documentation

## 2011-07-11 MED ORDER — DILTIAZEM HCL ER COATED BEADS 180 MG PO CP24: 180.0000 mg | ORAL_CAPSULE | Freq: Every day | ORAL | Status: DC

## 2011-07-11 MED ORDER — HYDROCHLOROTHIAZIDE 25 MG PO TABS: 25.0000 mg | ORAL_TABLET | Freq: Every day | ORAL | Status: DC

## 2011-07-11 NOTE — Assessment & Plan Note (Signed)
Exam done - with hx of ascus pap (hpv) and gyn fu last year No symptoms Pap today Unremarkable exam

## 2011-07-11 NOTE — Assessment & Plan Note (Signed)
Will check hpv again with today's pap

## 2011-07-11 NOTE — Patient Instructions (Addendum)
Pap done today  Do not forget to schedule your own mammogram, and do self breast exams monthly  Continue calcium and vitamin D Continue to address bone density issues with Dr Jefm Bryant Also disc whether you can have a shingles vaccine (zostavax) with Dr Dorothe Pea look ok  [facility] will [facility]

## 2011-07-11 NOTE — Progress Notes (Signed)
Subjective:    Patient ID: Carrie Hendricks, female    DOB: 05-Mar-1951, 61 y.o.   MRN: [id number]  HPI Here for health maintenance exam and to review chronic medical problems   Has been feeling ok overall  Nothing new going on  Is taking full time care of her dad - is really tough on her  He is worse- now with chf  She has lots of support   Tries to take care of herself  Also working full time   bp is   124/76  Today No cp or palpitations or headaches or edema  No side effects to medicines    Wt is stable with bmi of 19  Lipids are fairly good Lab Results  Component Value Date   CHOL 185 07/07/2011   HDL 65 07/07/2011   LDLCALC 108* 07/07/2011   TRIG 58 07/07/2011   CHOLHDL 2.8 07/07/2011    Diet--eats healthy and makes big effort to avoid sat fats -- lots of chicken / salad / vegetables  Exercise--not a lot of time for that - but on her feet all the time with caregiving    OP-- followed by Dr [place] D level is 62-- good  dexas are followed by Dr Jefm Bryant -- and not on any meds for OP  Is taking her calcium and vit D every day   Pap 12/11 ascus with hpv  Went to the gyn and was questioned  stress as the cause of the changes  Did not end up having any f/u studies  Was supposed to go back for f/u -- did not f/u  Will do pap today      colonosc 11/08 10 year f/u   Mam 12/11-- she wants to make her own appt Self exam- no lumps or changes   Zoster status-- never had vaccine -- on immunocomp meds    Patient Active Problem List  Diagnoses  . HYPERTENSION  . HEMORRHOIDS, INTERNAL, WITH BLEEDING  . RHEUMATOID ARTHRITIS  . OSTEOPOROSIS  . PAP SMEAR, ABNORMAL, ASCUS  . Routine general medical examination at a health care facility  . Gynecological examination   Past Medical History  Diagnosis Date  . Hypertension   . Osteoporosis   . Rheumatoid arthritis     ? scleroderma   Past Surgical History  Procedure Date  . Tubal ligation   . Leep 2005  . Surgery-  tendon release in hand 2006  . Dexa 07/2005    OP hip- 3.0  . Colonosc neg 11/08   History  Substance Use Topics  . Smoking status: Former Research scientist (life sciences)  . Smokeless tobacco: Not on file   Comment: Quit in 8/07  . Alcohol Use: Not on file   Family History  Problem Relation Age of Onset  . Heart disease Mother   . Diabetes Mother   . Hypertension Father   . Macular degeneration Father   . Dementia Father     early  . Arthritis Father     RA  . Alcohol abuse Father    No Known Allergies Current Outpatient Prescriptions on File Prior to Visit  Medication Sig Dispense Refill  . Aspirin (ADULT ASPIRIN LOW STRENGTH) 81 MG EC tablet Take 81 mg by mouth daily.        . Cholecalciferol (VITAMIN D3) 2000 UNITS capsule Take 2,000 Units by mouth daily.        Marland Kitchen diltiazem (CARDIZEM CD) 180 MG 24 hr capsule Take 180 mg by mouth daily.        Marland Kitchen  hydrochlorothiazide 25 MG tablet Take 1 tablet (25 mg total) by mouth daily.  90 tablet  3  . hydroxychloroquine (PLAQUENIL) 200 MG tablet Take by mouth daily.        . hydrocortisone (ANUSOL-HC) 25 MG suppository Place 25 mg rectally. 1 suppository per rectum at bedtime for 10 days as needed       . simethicone (GAS-X) 80 MG chewable tablet Chew 80 mg by mouth. OTC as directed          Review of Systems Review of Systems  Constitutional: Negative for fever, appetite change,  and unexpected weight change. some fatigue from schedule Eyes: Negative for pain and visual disturbance.  Respiratory: Negative for cough and shortness of breath.   Cardiovascular: Negative for cp or palpitations    Gastrointestinal: Negative for nausea, diarrhea and constipation.  Genitourinary: Negative for urgency and frequency.  Skin: Negative for pallor or rash   MSK pos for joint pain from RA (she deals with this well)  Neurological: Negative for weakness, light-headedness, numbness and headaches.  Hematological: Negative for adenopathy. Does not bruise/bleed easily.    Psychiatric/Behavioral: Negative for dysphoric mood. The patient is not nervous/anxious.  (is generally very stressed and deals with it well)         Objective:   Physical Exam  Constitutional: She appears well-developed and well-nourished. No distress.  HENT:  Head: Normocephalic and atraumatic.  Right Ear: External ear normal.  Left Ear: External ear normal.  Nose: Nose normal.  Mouth/Throat: Oropharynx is clear and moist.  Eyes: Conjunctivae and EOM are normal. Pupils are equal, round, and reactive to light. No scleral icterus.  Neck: Normal range of motion. Neck supple. No JVD present. Carotid bruit is not present. No thyromegaly present.  Cardiovascular: Normal rate, regular rhythm, normal heart sounds and intact distal pulses.  Exam reveals no gallop.   Pulmonary/Chest: Effort normal and breath sounds normal. No respiratory distress. She has no wheezes. She exhibits no tenderness.  Abdominal: Soft. Bowel sounds are normal. She exhibits no distension, no abdominal bruit and no mass. There is no tenderness.  Genitourinary: Rectum normal, vagina normal and uterus normal. No breast swelling, tenderness, discharge or bleeding. There is no rash on the right labia. Uterus is not enlarged and not tender. Cervix exhibits no motion tenderness, no discharge and no friability. Right adnexum displays no mass. Left adnexum displays no mass. No bleeding around the vagina. No vaginal discharge found.       Breast exam: No mass, nodules, thickening, tenderness, bulging, retraction, inflamation, nipple discharge or skin changes noted.  No axillary or clavicular LA.  Chaperoned exam.    Musculoskeletal: Normal range of motion. She exhibits no edema and no tenderness.  Lymphadenopathy:    She has no cervical adenopathy.  Neurological: She is alert. She has normal reflexes. No cranial nerve deficit. She exhibits normal muscle tone. Coordination normal.  Skin: Skin is warm and dry. No rash noted. No  erythema. No pallor.  Psychiatric: She has a normal mood and affect.          Assessment & Plan:

## 2011-07-11 NOTE — Telephone Encounter (Signed)
07/07/11 lab results faxed to Dr Precious Reel [phone] as pt requested.

## 2011-07-11 NOTE — Assessment & Plan Note (Signed)
bp in fair control at this time  No changes needed  Disc lifstyle change with low sodium diet and exercise   

## 2011-07-11 NOTE — Assessment & Plan Note (Signed)
Reviewed health habits including diet and exercise and skin cancer prevention Also reviewed health mt list, fam hx and immunizations  Overall going through a lot with caregiving and work - coping well Rev wellness labs today Enc good health habits

## 2011-07-11 NOTE — Assessment & Plan Note (Signed)
Not currently on meds Ca and D  Exercise could be better  This is followed by Dr [facility](rheum)

## 2011-07-21 ENCOUNTER — Telehealth: Payer: Self-pay | Admitting: Family Medicine

## 2011-07-21 DIAGNOSIS — R87610 Atypical squamous cells of undetermined significance on cytologic smear of cervix (ASC-US): Secondary | ICD-10-CM

## 2011-07-21 NOTE — Telephone Encounter (Signed)
Ref to gyn for abn pap

## 2011-08-31 ENCOUNTER — Ambulatory Visit: Payer: Self-pay | Admitting: Family Medicine

## 2011-08-31 LAB — HM MAMMOGRAPHY: HM Mammogram: NORMAL

## 2011-09-02 ENCOUNTER — Encounter: Payer: Self-pay | Admitting: Family Medicine

## 2011-09-06 ENCOUNTER — Encounter: Payer: Self-pay | Admitting: *Deleted

## 2011-09-06 ENCOUNTER — Encounter: Payer: Self-pay | Admitting: Family Medicine

## 2011-09-28 ENCOUNTER — Other Ambulatory Visit: Payer: Self-pay | Admitting: *Deleted

## 2011-09-28 MED ORDER — HYDROCHLOROTHIAZIDE 25 MG PO TABS: 25.0000 mg | ORAL_TABLET | Freq: Every day | ORAL | Status: DC

## 2011-09-28 NOTE — Telephone Encounter (Signed)
Patient dropped off a form from [facility] to have HCTZ refilled.  Dr. [name] signed the form and it was faxed back to [facility] at [phone].

## 2011-10-06 ENCOUNTER — Telehealth: Payer: Self-pay

## 2011-10-06 NOTE — Telephone Encounter (Signed)
Pt had Echo done for Dr Precious Reel and Dr Scharlene Gloss office spoke with pt and made referral to pulmonologist Dr Raul Del for [place], 10/11/11. Pt does not understand why she needs to see pulmonologist(pt said she asked Dr Nancie Neas nurse without satisfactory explanation) and pt just today asked Dr Nancie Neas office to fax echo report to Dr [name] for her advice about seeing Dr Raul Del. Pt understands Dr [name] is not in office until [facility] 10/10/11. Pt request call back at [phone] 9am to 6pm(pt at lunch 12-1pm.

## 2011-10-06 NOTE — Telephone Encounter (Signed)
Please make sure I have his last progress note from Dr Norvel Richards also please so I can review it Also could you please fax a copy of this phone note to his office so they are aware of the situation I do not know how much I can help, thanks- then I will review all when I return to the office  Thanks!

## 2011-10-06 NOTE — Telephone Encounter (Signed)
Requested a copy of most recent OV note from Dr. Scharlene Gloss office, a copy of this noted faxed to Dr. Scharlene Gloss office at [phone].  Will wait OV noted from his office and place in your IN box.

## 2011-10-10 ENCOUNTER — Encounter: Payer: Self-pay | Admitting: Family Medicine

## 2011-10-10 DIAGNOSIS — I272 Pulmonary hypertension, unspecified: Secondary | ICD-10-CM | POA: Insufficient documentation

## 2011-10-10 NOTE — Telephone Encounter (Signed)
I reviewed the note - according to this and echo-- he suspects pulmonary HTN -- a condition where there is too much pressure in blood vessels of the lungs - that backs up to the heart and can cause problems  I suspect he feels this is related to her rheumatologic problem and the referral to pulmonary is likely to confirm the dx and see if anything can be done about it to protect the heart  This is my guess based on record review Keep the pulm appt

## 2011-10-10 NOTE — Telephone Encounter (Signed)
I read their note - they specified to return to their office for the 6 month pap-- so go ahead and do that and then ask when we can take over from here - thanks

## 2011-10-10 NOTE — Telephone Encounter (Signed)
Patient advised as instructed via telephone.

## 2011-10-10 NOTE — Telephone Encounter (Signed)
Patient advised as instructed via telephone.  She is suppose to have a follow up pap every six months due to an abnormal pap she has had in the past.  She wanted to know if Dr. [name] would do the pap or does she need to go back to the office where Dr. [name] referred her to?  Please advise.

## 2011-10-18 LAB — PULMONARY FUNCTION TEST

## 2011-12-29 ENCOUNTER — Other Ambulatory Visit: Payer: Self-pay

## 2011-12-29 MED ORDER — HYDROCHLOROTHIAZIDE 25 MG PO TABS: 25.0000 mg | ORAL_TABLET | Freq: Every day | ORAL | Status: DC

## 2011-12-29 NOTE — Telephone Encounter (Signed)
Pt changed insurance carriers and needs refill HCTZ to [facility] Hendricks # [phone] x 2.pt notified refill done.

## 2012-08-07 ENCOUNTER — Encounter: Payer: Self-pay | Admitting: Family Medicine

## 2012-08-07 ENCOUNTER — Other Ambulatory Visit (HOSPITAL_COMMUNITY)
Admission: RE | Admit: 2012-08-07 | Discharge: 2012-08-07 | Disposition: A | Discharge status: Home / Self Care | Payer: BC Managed Care – PPO | Source: Ambulatory Visit | Attending: Family Medicine | Admitting: Family Medicine

## 2012-08-07 ENCOUNTER — Ambulatory Visit (INDEPENDENT_AMBULATORY_CARE_PROVIDER_SITE_OTHER): Payer: BC Managed Care – PPO | Admitting: Family Medicine

## 2012-08-07 VITALS — BP 130/78 | HR 68 | Temp 97.6°F | Ht 65.5 in | Wt 116.5 lb

## 2012-08-07 DIAGNOSIS — R87610 Atypical squamous cells of undetermined significance on cytologic smear of cervix (ASC-US): Secondary | ICD-10-CM

## 2012-08-07 DIAGNOSIS — Z01419 Encounter for gynecological examination (general) (routine) without abnormal findings: Secondary | ICD-10-CM | POA: Insufficient documentation

## 2012-08-07 LAB — HM PAP SMEAR: HM Pap smear: NORMAL

## 2012-08-07 NOTE — Assessment & Plan Note (Signed)
6 mo f/u pap after ascus (HPV) in 1/13- then 6 mo pap that was clear  This is 2nd 6 mo f/u No problems or concerns Of note- pt had leep in the past  Will update with result Per pt /gyn if this pap is neg we can go back to yearly pap

## 2012-08-07 NOTE — Patient Instructions (Addendum)
Pap today I'm glad you are doing well  If this pap is normal we can do next one in a year Schedule your physical on the way out

## 2012-08-07 NOTE — Progress Notes (Signed)
Subjective:    Patient ID: Carrie Hendricks, female    DOB: [birth year], 62 y.o.   MRN: [id number]  HPI Here for pap re check   Had ascus pap in 1/13 followed by colp at [place] to have chronic cervicitis HPV also   Last pap was there 6 mo ago - it was fine   No gyn problems or symptoms    Patient Active Problem List  Diagnosis  . HYPERTENSION  . HEMORRHOIDS, INTERNAL, WITH BLEEDING  . RHEUMATOID ARTHRITIS  . OSTEOPOROSIS  . PAP SMEAR, ABNORMAL, ASCUS  . Routine general medical examination at a health care facility  . Gynecological examination  . Pulmonary hypertension   Past Medical History  Diagnosis Date  . Hypertension   . Osteoporosis   . Rheumatoid arthritis     ? scleroderma  . Pulmonary hypertension    Past Surgical History  Procedure Date  . Tubal ligation   . Leep 2005  . Surgery- tendon release in hand 2006  . Dexa 07/2005    OP hip- 3.0  . Colonosc neg 11/08   History  Substance Use Topics  . Smoking status: Former Research scientist (life sciences)  . Smokeless tobacco: Not on file     Comment: Quit in 8/07  . Alcohol Use: No   Family History  Problem Relation Age of Onset  . Heart disease Mother   . Diabetes Mother   . Hypertension Father   . Macular degeneration Father   . Dementia Father     early  . Arthritis Father     RA  . Alcohol abuse Father    No Known Allergies Current Outpatient Prescriptions on File Prior to Visit  Medication Sig Dispense Refill  . amLODipine (NORVASC) 5 MG tablet Take 5 mg by mouth daily.       . Aspirin (ADULT ASPIRIN LOW STRENGTH) 81 MG EC tablet Take 81 mg by mouth daily.        . Cholecalciferol (VITAMIN D3) 2000 UNITS capsule Take 2,000 Units by mouth daily.        Scarlette Shorts SURECLICK 50 MG/ML injection 50 mg once a week.       . hydrochlorothiazide (HYDRODIURIL) 25 MG tablet Take 1 tablet (25 mg total) by mouth daily.  90 tablet  2  . methotrexate (RHEUMATREX) 2.5 MG tablet 6 tabs weekly ( 3 tabs on Sat and 3 tabs on  Sun)      . simethicone (GAS-X) 80 MG chewable tablet Chew 80 mg by mouth. OTC as directed          Review of Systems    Review of Systems  Constitutional: Negative for fever, appetite change, fatigue and unexpected weight change.  Eyes: Negative for pain and visual disturbance.  Respiratory: Negative for cough and shortness of breath.   Cardiovascular: Negative for cp or palpitations    Gastrointestinal: Negative for nausea, diarrhea and constipation. neg for bloating  Genitourinary: Negative for urgency and frequency. neg for vaginal bleeding or discharge Skin: Negative for pallor or rash   Neurological: Negative for weakness, light-headedness, numbness and headaches.  Hematological: Negative for adenopathy. Does not bruise/bleed easily.  Psychiatric/Behavioral: Negative for dysphoric mood. The patient is not nervous/anxious.      Objective:   Physical Exam  Constitutional: She appears well-developed and well-nourished. No distress.  HENT:  Head: Normocephalic and atraumatic.  Eyes: Conjunctivae normal and EOM are normal. Pupils are equal, round, and reactive to light.  Cardiovascular: Normal rate and  regular rhythm.   Abdominal: Soft. Bowel sounds are normal. She exhibits no distension and no mass. There is no tenderness.       No suprapubic tenderness or fullness    Genitourinary: Uterus normal. There is no rash, tenderness or lesion on the right labia. There is no rash, tenderness or lesion on the left labia. Uterus is not tender. Cervix exhibits no motion tenderness, no discharge and no friability. Right adnexum displays no tenderness. Left adnexum displays no tenderness. No bleeding around the vagina. No vaginal discharge found.       Cervix shows evidence or previous LEEP  Neurological: She is alert.  Skin: Skin is warm and dry. No rash noted.  Psychiatric: She has a normal mood and affect.          Assessment & Plan:

## 2012-08-09 ENCOUNTER — Encounter: Payer: Self-pay | Admitting: Family Medicine

## 2012-08-09 ENCOUNTER — Encounter: Payer: Self-pay | Admitting: *Deleted

## 2012-10-02 ENCOUNTER — Other Ambulatory Visit: Payer: Self-pay | Admitting: *Deleted

## 2012-10-02 MED ORDER — HYDROCHLOROTHIAZIDE 25 MG PO TABS: 25.0000 mg | ORAL_TABLET | Freq: Every day | ORAL | Status: DC

## 2012-10-19 ENCOUNTER — Telehealth: Payer: Self-pay | Admitting: Family Medicine

## 2012-10-19 NOTE — Telephone Encounter (Signed)
The patient called and is hoping to get medication for sinus congestion.  Her callback number is [phone]

## 2012-10-22 ENCOUNTER — Telehealth: Payer: Self-pay

## 2012-10-22 NOTE — Telephone Encounter (Signed)
Triage Record Num: [id number] Operator: Earnest Rosier Patient Name: Marguerita Stapp Call Date & Time: 10/19/2012 3:18:44PM Patient Phone: [phone] PCP: Wynelle Fanny. Tower Patient Gender: Female PCP Fax : Patient DOB: [birth year] Practice Name: [facility] Reason for Call: Taraji states she had called earlier 10/19/12 requesting antibiotic for sinus congestion ( had not heard back), offered to schedule appointment for 10/20/12 patient declined appointment. Protocol(s) Used: Information Only Call; No Symptom Triage (Adult) Recommended Outcome per Protocol: Provide Information or Advice Only Reason for Outcome: Follow-up call to recent contact; no triage required. Information provided from past call documentation, approved references or experience. Care Advice: ~ 04/

## 2012-10-22 NOTE — Telephone Encounter (Signed)
Thanks for the update

## 2012-10-22 NOTE — Telephone Encounter (Signed)
Pt still declined appt she is taking Mucinex and it seems to be helping, pt said she has an appt with [facility] today and she will ask her rheumatologist to prescribed something if he listens to her lungs and thinks she needs an Rx, pt declined an appt because she is dealing with her dad in hospice

## 2012-10-22 NOTE — Telephone Encounter (Signed)
Sounds like she declined appt - please have her f/u if symptoms do not improve

## 2013-10-15 ENCOUNTER — Encounter: Payer: Self-pay | Admitting: Family Medicine

## 2013-10-15 ENCOUNTER — Ambulatory Visit (INDEPENDENT_AMBULATORY_CARE_PROVIDER_SITE_OTHER): Payer: No Typology Code available for payment source | Admitting: Family Medicine

## 2013-10-15 VITALS — BP 122/90 | HR 74 | Temp 97.7°F | Ht 65.5 in | Wt 117.0 lb

## 2013-10-15 DIAGNOSIS — I10 Essential (primary) hypertension: Secondary | ICD-10-CM

## 2013-10-15 DIAGNOSIS — R609 Edema, unspecified: Secondary | ICD-10-CM

## 2013-10-15 DIAGNOSIS — R600 Localized edema: Secondary | ICD-10-CM | POA: Insufficient documentation

## 2013-10-15 DIAGNOSIS — Z23 Encounter for immunization: Secondary | ICD-10-CM

## 2013-10-15 MED ORDER — HYDROCHLOROTHIAZIDE 25 MG PO TABS: 25.0000 mg | ORAL_TABLET | Freq: Every day | ORAL | Status: DC

## 2013-10-15 MED ORDER — AMLODIPINE BESYLATE 5 MG PO TABS: 5.0000 mg | ORAL_TABLET | Freq: Every day | ORAL | Status: DC

## 2013-10-15 NOTE — Progress Notes (Signed)
Pre visit review using our clinic review tool, if applicable. No additional management support is needed unless otherwise documented below in the visit note. 

## 2013-10-15 NOTE — Progress Notes (Signed)
Subjective:    Patient ID: Carrie Hendricks, female    DOB: 22-Dec-1950, 63 y.o.   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 Pulmonary hypertension 10/10/2011  . Gynecological examination 07/11/2011  . Routine general medical examination at a health care facility 07/03/2011  . PAP SMEAR, ABNORMAL, ASCUS 06/17/2010  . HEMORRHOIDS, INTERNAL, WITH BLEEDING 03/15/2010  . HYPERTENSION 11/16/2006  . RHEUMATOID ARTHRITIS 11/16/2006  . OSTEOPOROSIS 11/16/2006   Past Medical History  Diagnosis Date  . Hypertension   . Osteoporosis   . Rheumatoid arthritis(714.0)     ? scleroderma  . Pulmonary hypertension    Past Surgical History  Procedure Laterality Date  . Tubal ligation    . Leep  2005  . Surgery- tendon release in hand  2006  . Dexa  07/2005    OP hip- 3.0  . Colonosc neg  11/08   History  Substance Use Topics  . Smoking status: Former Research scientist (life sciences)  . Smokeless tobacco: Not on file     Comment: Quit in 8/07  . Alcohol Use: No   Family History  Problem Relation Age of Onset  . Heart disease Mother   . Diabetes Mother   . Hypertension Father   . Macular degeneration Father   . Dementia Father     early  . Arthritis Father     RA  . Alcohol abuse Father    No Known Allergies Current Outpatient Prescriptions on File Prior to  Visit  Medication Sig Dispense Refill  . amLODipine (NORVASC) 5 MG tablet Take 5 mg by mouth daily.       . Aspirin (ADULT ASPIRIN LOW STRENGTH) 81 MG EC tablet Take 81 mg by mouth daily.        . Cholecalciferol (VITAMIN D3) 2000 UNITS capsule Take 2,000 Units by mouth daily.        . folic acid (FOLVITE) 1 MG tablet Take 1 mg by mouth daily.       . methotrexate (RHEUMATREX) 2.5 MG tablet 6 tabs weekly ( 3 tabs on Sat and 3 tabs on Sun)      . Multiple Vitamin (MULTIVITAMIN) capsule Take 1 capsule by mouth daily.      . hydrochlorothiazide (HYDRODIURIL) 25 MG tablet Take 1 tablet (25 mg total) by mouth daily.  90 tablet  1   No current facility-administered medications on file prior to visit.      Review of Systems Review of Systems  Constitutional: Negative for fever, appetite change, fatigue and unexpected weight change.  Eyes: Negative for pain and visual disturbance.  Respiratory: Negative for cough and shortness of breath.   Cardiovascular: Negative  for cp or palpitations    Gastrointestinal: Negative for nausea, diarrhea and constipation.  Genitourinary: Negative for urgency and frequency.  Skin: Negative for pallor or rash   Neurological: Negative for weakness, light-headedness, numbness and headaches.  Hematological: Negative for adenopathy. Does not bruise/bleed easily.  Psychiatric/Behavioral: pos for grief that is improving . The patient is not nervous/anxious.         Objective:   Physical Exam  Constitutional: She appears well-developed and well-nourished. No distress.  Slim and somewhat frail appearing female   HENT:  Head: Normocephalic and atraumatic.  Mouth/Throat: Oropharynx is clear and moist.  Eyes: Conjunctivae and EOM are normal. Pupils are equal, round, and reactive to light. Right eye exhibits no discharge. Left eye exhibits no discharge. No scleral icterus.  Neck: Normal range of motion. Neck supple. No JVD present. Carotid bruit is not present. No  thyromegaly present.  Cardiovascular: Normal rate, regular rhythm, normal heart sounds and intact distal pulses.  Exam reveals no gallop.   Pulmonary/Chest: Effort normal and breath sounds normal. No respiratory distress. She has no wheezes. She has no rales.  Abdominal: Soft. Bowel sounds are normal. She exhibits no distension and no abdominal bruit. There is no tenderness. There is no rebound.  Musculoskeletal: She exhibits no edema and no tenderness.  No edema today- she took a lasix  Pos for arthritis change in her feet -notable   Lymphadenopathy:    She has no cervical adenopathy.  Neurological: She is alert. She has normal reflexes. No cranial nerve deficit. She exhibits normal muscle tone. Coordination normal.  Skin: Skin is warm and dry. No rash noted. No erythema. No pallor.  Psychiatric: She has a normal mood and affect.          Assessment & Plan:

## 2013-10-15 NOTE — Patient Instructions (Addendum)
Labs today Start back on your hctz and also amlodipine If any problems / or low blood pressure-please let me know Pneumonia vaccine update today See you in the fall  Take care of yourself -make sure you eat regular meals

## 2013-10-16 ENCOUNTER — Telehealth: Payer: Self-pay | Admitting: Family Medicine

## 2013-10-16 LAB — LIPID PANEL
CHOL/HDL RATIO: 3
Cholesterol: 178 mg/dL (ref 0–200)
HDL: 65.5 mg/dL (ref >39.00)
LDL CALC: 95 mg/dL (ref 0–99)
Triglycerides: 86 mg/dL (ref 0.0–149.0)
VLDL: 17.2 mg/dL (ref 0.0–40.0)

## 2013-10-16 NOTE — Telephone Encounter (Signed)
Relevant patient education mailed to patient.  

## 2013-10-16 NOTE — Assessment & Plan Note (Signed)
None today- since she took lasix Will start back on her hctz  Aware that amlodipine can cause edema-will continue to follow this

## 2013-10-16 NOTE — Assessment & Plan Note (Signed)
Pt off her bp med currently- and c/o edema when she does not take a diuretic Will re start hctz and also amlodipine F/u as planned in the fall She gets regular chem labs for her methotrexate -will watch for electolyte abn Lipid panel today

## 2013-10-18 ENCOUNTER — Encounter: Payer: Self-pay | Admitting: *Deleted

## 2014-03-05 ENCOUNTER — Encounter: Payer: Self-pay | Admitting: Family Medicine

## 2014-03-05 ENCOUNTER — Other Ambulatory Visit (HOSPITAL_COMMUNITY)
Admission: RE | Admit: 2014-03-05 | Discharge: 2014-03-05 | Disposition: A | Discharge status: Home / Self Care | Payer: No Typology Code available for payment source | Source: Ambulatory Visit | Attending: Family Medicine | Admitting: Family Medicine

## 2014-03-05 ENCOUNTER — Ambulatory Visit (INDEPENDENT_AMBULATORY_CARE_PROVIDER_SITE_OTHER): Payer: No Typology Code available for payment source | Admitting: Family Medicine

## 2014-03-05 VITALS — BP 118/84 | HR 70 | Temp 98.2°F | Ht 65.25 in | Wt 113.8 lb

## 2014-03-05 DIAGNOSIS — Z1151 Encounter for screening for human papillomavirus (HPV): Secondary | ICD-10-CM | POA: Diagnosis present

## 2014-03-05 DIAGNOSIS — Z0000 Encounter for general adult medical examination without abnormal findings: Secondary | ICD-10-CM

## 2014-03-05 DIAGNOSIS — M069 Rheumatoid arthritis, unspecified: Secondary | ICD-10-CM

## 2014-03-05 DIAGNOSIS — Z01419 Encounter for gynecological examination (general) (routine) without abnormal findings: Secondary | ICD-10-CM | POA: Diagnosis not present

## 2014-03-05 DIAGNOSIS — M810 Age-related osteoporosis without current pathological fracture: Secondary | ICD-10-CM

## 2014-03-05 DIAGNOSIS — I10 Essential (primary) hypertension: Secondary | ICD-10-CM

## 2014-03-05 DIAGNOSIS — M359 Systemic involvement of connective tissue, unspecified: Secondary | ICD-10-CM | POA: Insufficient documentation

## 2014-03-05 DIAGNOSIS — Z23 Encounter for immunization: Secondary | ICD-10-CM

## 2014-03-05 LAB — CBC WITH DIFFERENTIAL/PLATELET
BASOS PCT: 0.5 % (ref 0.0–3.0)
Basophils Absolute: 0 10*3/uL (ref 0.0–0.1)
EOS PCT: 2 % (ref 0.0–5.0)
Eosinophils Absolute: 0.1 10*3/uL (ref 0.0–0.7)
HCT: 44.6 % (ref 36.0–46.0)
Hemoglobin: 15 g/dL (ref 12.0–15.0)
LYMPHS PCT: 31.1 % (ref 12.0–46.0)
Lymphs Abs: 2.1 10*3/uL (ref 0.7–4.0)
MCHC: 33.7 g/dL (ref 30.0–36.0)
MCV: 100.4 fl — ABNORMAL HIGH (ref 78.0–100.0)
MONOS PCT: 6.7 % (ref 3.0–12.0)
Monocytes Absolute: 0.5 10*3/uL (ref 0.1–1.0)
Neutro Abs: 4 10*3/uL (ref 1.4–7.7)
Neutrophils Relative %: 59.7 % (ref 43.0–77.0)
Platelets: 206 10*3/uL (ref 150.0–400.0)
RBC: 4.44 Mil/uL (ref 3.87–5.11)
RDW: 13.1 % (ref 11.5–15.5)
WBC: 6.7 10*3/uL (ref 4.0–10.5)

## 2014-03-05 LAB — COMPREHENSIVE METABOLIC PANEL
ALBUMIN: 4 g/dL (ref 3.5–5.2)
ALT: 26 U/L (ref 0–35)
AST: 25 U/L (ref 0–37)
Alkaline Phosphatase: 53 U/L (ref 39–117)
BUN: 13 mg/dL (ref 6–23)
CHLORIDE: 100 meq/L (ref 96–112)
CO2: 31 mEq/L (ref 19–32)
Calcium: 9.8 mg/dL (ref 8.4–10.5)
Creatinine, Ser: 0.8 mg/dL (ref 0.4–1.2)
GFR: 72.72 mL/min (ref >60.00)
GLUCOSE: 84 mg/dL (ref 70–99)
POTASSIUM: 3.6 meq/L (ref 3.5–5.1)
Sodium: 138 mEq/L (ref 135–145)
TOTAL PROTEIN: 7.4 g/dL (ref 6.0–8.3)
Total Bilirubin: 1.2 mg/dL (ref 0.2–1.2)

## 2014-03-05 LAB — LIPID PANEL
Cholesterol: 195 mg/dL (ref 0–200)
HDL: 69.8 mg/dL (ref >39.00)
LDL CALC: 113 mg/dL — AB (ref 0–99)
NonHDL: 125.2
Total CHOL/HDL Ratio: 3
Triglycerides: 63 mg/dL (ref 0.0–149.0)
VLDL: 12.6 mg/dL (ref 0.0–40.0)

## 2014-03-05 LAB — SEDIMENTATION RATE: SED RATE: 9 mm/h (ref 0–22)

## 2014-03-05 LAB — TSH: TSH: 1.09 u[IU]/mL (ref 0.35–4.50)

## 2014-03-05 MED ORDER — AMLODIPINE BESYLATE 5 MG PO TABS: 5.0000 mg | ORAL_TABLET | Freq: Every day | ORAL | Status: DC

## 2014-03-05 MED ORDER — HYDROCHLOROTHIAZIDE 25 MG PO TABS: 25.0000 mg | ORAL_TABLET | Freq: Every day | ORAL | Status: DC

## 2014-03-05 NOTE — Patient Instructions (Signed)
Labs today  Flu shot today  Pap done today  Don't forget to schedule your annual mammogram  Try to get 1200-1500 mg of calcium per day with at least 1000 iu of vitamin D - for bone health

## 2014-03-05 NOTE — Assessment & Plan Note (Signed)
Pap and exam done No problems Ascus pap 2013- frequent f/u and nl 2/14 LEEP in 2005

## 2014-03-05 NOTE — Progress Notes (Signed)
Pre visit review using our clinic review tool, if applicable. No additional management support is needed unless otherwise documented below in the visit note. 

## 2014-03-05 NOTE — Progress Notes (Signed)
Subjective:    Patient ID: Carrie Hendricks, female    DOB: 23-Aug-1950, 63 y.o.   MRN: [id number]  HPI Here for health maintenance exam and to review chronic medical problems    Doing well overall  She tries to take care of herself   Wt is down 4 lb with bmi of 18 She eats a healthy diet overall , she eats salads and chicken  3 meals and snacks  More active in the summer  Walks on her breaks at work   Zoster status- has not had a vaccine  Has been off enbrel for a while - but not is on plaquenil and mtx Unsure if she can get it   Dental appt today  RA-saw Dr Jefm Bryant today - she needs surgery on her wrist -on 2 meds  She cannot afford surgery at this time   Mammogram 2/13 nl -she thinks she had one more frequently  Goes to [facility] -will make appt  Self exam- no lumps   Flu vaccine -today  Pap 2/14 nl Will do today , no gyn problems or symptoms  Prev ascus pap- with some 6 mo f/u  Also LEEP in 2005  Td 12/11 Pneumovax 4/15   colonosc 6/12 nl   dexa 1/07 OP Has never taken med for OP  She does not want to schedule it yet  Had one fall / no fractures    Mood - is ok /not depressed   bp is stable today  No cp or palpitations or headaches or edema  No side effects to medicines  BP Readings from Last 3 Encounters:  03/05/14 118/84  10/15/13 122/90  08/07/12 130/78     Due for labs    Patient Active Problem List   Diagnosis Date Noted  . Pedal edema 10/15/2013  . Pulmonary hypertension 10/10/2011  . Encounter for routine gynecological examination 07/11/2011  . Routine general medical examination at a health care facility 07/03/2011  . PAP SMEAR, ABNORMAL, ASCUS 06/17/2010  . HEMORRHOIDS, INTERNAL, WITH BLEEDING 03/15/2010  . HYPERTENSION 11/16/2006  . RHEUMATOID ARTHRITIS 11/16/2006  . OSTEOPOROSIS 11/16/2006   Past Medical History  Diagnosis Date  . Hypertension   . Osteoporosis   . Rheumatoid arthritis(714.0)     ? scleroderma  . Pulmonary  hypertension    Past Surgical History  Procedure Laterality Date  . Tubal ligation    . Leep  2005  . Surgery- tendon release in hand  2006  . Dexa  07/2005    OP hip- 3.0  . Colonosc neg  11/08   History  Substance Use Topics  . Smoking status: Former Research scientist (life sciences)  . Smokeless tobacco: Not on file     Comment: Quit in 8/07  . Alcohol Use: No   Family History  Problem Relation Age of Onset  . Heart disease Mother   . Diabetes Mother   . Hypertension Father   . Macular degeneration Father   . Dementia Father     early  . Arthritis Father     RA  . Alcohol abuse Father    No Known Allergies Current Outpatient Prescriptions on File Prior to Visit  Medication Sig Dispense Refill  . amLODipine (NORVASC) 5 MG tablet Take 1 tablet (5 mg total) by mouth daily.  90 tablet  3  . Aspirin (ADULT ASPIRIN LOW STRENGTH) 81 MG EC tablet Take 81 mg by mouth daily.        . Cholecalciferol (VITAMIN D3) 2000 UNITS capsule  Take 2,000 Units by mouth daily.        . folic acid (FOLVITE) 1 MG tablet Take 1 mg by mouth daily.       . hydrochlorothiazide (HYDRODIURIL) 25 MG tablet Take 1 tablet (25 mg total) by mouth daily.  90 tablet  3  . hydroxychloroquine (PLAQUENIL) 200 MG tablet Take 200 mg by mouth daily.      . methotrexate (RHEUMATREX) 2.5 MG tablet 6 tabs weekly ( 3 tabs on Sat and 3 tabs on Sun)      . Multiple Vitamin (MULTIVITAMIN) capsule Take 1 capsule by mouth daily.      . NON FORMULARY Take 3 capsules by mouth daily. NATURES BOUNTY-EXTRA STRENGTH (Hair, Skin, Nails) 5000 mcg of Biotin       No current facility-administered medications on file prior to visit.    Review of Systems Review of Systems  Constitutional: Negative for fever, appetite change, fatigue and unexpected weight change.  Eyes: Negative for pain and visual disturbance.  Respiratory: Negative for cough and shortness of breath.   Cardiovascular: Negative for cp or palpitations    Gastrointestinal: Negative for  nausea, diarrhea and constipation.  Genitourinary: Negative for urgency and frequency.  Skin: Negative for pallor or rash  MSK pos for joint pain and deformity from RA  Neurological: Negative for weakness, light-headedness, numbness and headaches.  Hematological: Negative for adenopathy. Does not bruise/bleed easily.  Psychiatric/Behavioral: Negative for dysphoric mood. The patient is not nervous/anxious.          Objective:   Physical Exam  Constitutional: She appears well-developed and well-nourished. No distress.  Slim and well apppearing   HENT:  Head: Normocephalic and atraumatic.  Right Ear: External ear normal.  Left Ear: External ear normal.  Mouth/Throat: Oropharynx is clear and moist.  Eyes: Conjunctivae and EOM are normal. Pupils are equal, round, and reactive to light. No scleral icterus.  Neck: Normal range of motion. Neck supple. No JVD present. Carotid bruit is not present. No thyromegaly present.  Cardiovascular: Normal rate, regular rhythm, normal heart sounds and intact distal pulses.  Exam reveals no gallop.   Pulmonary/Chest: Effort normal and breath sounds normal. No respiratory distress. She has no wheezes. She exhibits no tenderness.  Abdominal: Soft. Bowel sounds are normal. She exhibits no distension, no abdominal bruit and no mass. There is no tenderness.  Genitourinary: Vagina normal and uterus normal. No breast swelling, tenderness, discharge or bleeding. There is no rash, tenderness or lesion on the right labia. There is no rash, tenderness or lesion on the left labia. Uterus is not tender. Cervix exhibits no motion tenderness, no discharge and no friability. Right adnexum displays no mass, no tenderness and no fullness. Left adnexum displays no mass and no tenderness. No bleeding around the vagina. No vaginal discharge found.  Breast exam: No mass, nodules, thickening, tenderness, bulging, retraction, inflamation, nipple discharge or skin changes noted.  No  axillary or clavicular LA.      Musculoskeletal: Normal range of motion. She exhibits no edema and no tenderness.  Diffuse joint deformity from RA noted   Lymphadenopathy:    She has no cervical adenopathy.  Neurological: She is alert. She has normal reflexes. No cranial nerve deficit. She exhibits normal muscle tone. Coordination normal.  Skin: Skin is warm and dry. No rash noted. No erythema. No pallor.  Solar lentigos diffusely   Psychiatric: She has a normal mood and affect.          Assessment &  Plan:   Problem List Items Addressed This Visit     Cardiovascular and Mediastinum   HYPERTENSION      bp in fair control at this time  BP Readings from Last 1 Encounters:  03/05/14 118/84   No changes needed Disc lifstyle change with low sodium diet and exercise  Refilled hctz and amlodipine Labs drawn today    Relevant Medications      amLODIpine (NORVASC) tablet      hydrochlorothiazide tablet     Musculoskeletal and Integument   Rheumatoid arthritis(714.0)     Will continue f/u Dr Jefm Bryant Sed rate today  Cannot get zoster vaccine due to current meds  Did have flu vaccine     Relevant Orders      Sedimentation Rate (Completed)   OSTEOPOROSIS     Pt declines dexa this year due to cost Disc safety and fx risk Disc need for calcium/ vitamin D/ wt bearing exercise and bone density test every 2 y to monitor Disc safety/ fracture risk in detail        Other   Routine general medical examination at a health care facility - Primary     Reviewed health habits including diet and exercise and skin cancer prevention Reviewed appropriate screening tests for age  Also reviewed health mt list, fam hx and immunization status , as well as social and family history   See HPI Labs today for wellness Pt will schedule own mammogram  She declines dexa this year due to cost     Relevant Orders      CBC with Differential (Completed)      Comprehensive metabolic panel  (Completed)      TSH (Completed)      Lipid panel (Completed)   Encounter for routine gynecological examination     Pap and exam done No problems Ascus pap 2013- frequent f/u and nl 2/14 LEEP in 2005      Relevant Orders      Cytology - PAP    Other Visit Diagnoses   Need for prophylactic vaccination and inoculation against influenza        Relevant Orders       Flu Vaccine QUAD 36+ mos PF IM (Fluarix Quad PF) (Completed)

## 2014-03-05 NOTE — Assessment & Plan Note (Signed)
Pt declines dexa this year due to cost Disc safety and fx risk Disc need for calcium/ vitamin D/ wt bearing exercise and bone density test every 2 y to monitor Disc safety/ fracture risk in detail

## 2014-03-05 NOTE — Assessment & Plan Note (Signed)
Will continue f/u Dr Jefm Bryant Sed rate today  Cannot get zoster vaccine due to current meds  Did have flu vaccine

## 2014-03-05 NOTE — Assessment & Plan Note (Signed)
Reviewed health habits including diet and exercise and skin cancer prevention Reviewed appropriate screening tests for age  Also reviewed health mt list, fam hx and immunization status , as well as social and family history   See HPI Labs today for wellness Pt will schedule own mammogram  She declines dexa this year due to cost

## 2014-03-05 NOTE — Assessment & Plan Note (Signed)
bp in fair control at this time  BP Readings from Last 1 Encounters:  03/05/14 118/84   No changes needed Disc lifstyle change with low sodium diet and exercise  Refilled hctz and amlodipine Labs drawn today

## 2014-03-07 ENCOUNTER — Encounter: Payer: Self-pay | Admitting: *Deleted

## 2014-03-11 LAB — CYTOLOGY - PAP

## 2014-03-12 ENCOUNTER — Encounter: Payer: Self-pay | Admitting: *Deleted

## 2014-04-14 ENCOUNTER — Ambulatory Visit: Payer: Self-pay | Admitting: Family Medicine

## 2014-04-15 ENCOUNTER — Encounter: Payer: Self-pay | Admitting: Family Medicine

## 2014-04-17 ENCOUNTER — Encounter: Payer: Self-pay | Admitting: *Deleted

## 2014-05-16 ENCOUNTER — Encounter: Payer: Self-pay | Admitting: Family Medicine

## 2014-05-16 ENCOUNTER — Ambulatory Visit (INDEPENDENT_AMBULATORY_CARE_PROVIDER_SITE_OTHER): Payer: No Typology Code available for payment source | Admitting: Family Medicine

## 2014-05-16 ENCOUNTER — Ambulatory Visit (INDEPENDENT_AMBULATORY_CARE_PROVIDER_SITE_OTHER)
Admission: RE | Admit: 2014-05-16 | Discharge: 2014-05-16 | Disposition: A | Discharge status: Home / Self Care | Payer: No Typology Code available for payment source | Source: Ambulatory Visit | Attending: Family Medicine | Admitting: Family Medicine

## 2014-05-16 VITALS — BP 126/84 | HR 72 | Temp 98.1°F | Ht 65.25 in | Wt 115.5 lb

## 2014-05-16 DIAGNOSIS — R0602 Shortness of breath: Secondary | ICD-10-CM

## 2014-05-16 DIAGNOSIS — F430 Acute stress reaction: Secondary | ICD-10-CM

## 2014-05-16 DIAGNOSIS — Z8709 Personal history of other diseases of the respiratory system: Secondary | ICD-10-CM

## 2014-05-16 NOTE — Progress Notes (Signed)
Subjective:    Patient ID: Carrie Hendricks, female    DOB: 05/07/1951, 63 y.o.   MRN: [id number]  HPI Here for breathing problems   She is short of breath  Walking and talking  Talking too much or too fast  occ sensation on L side of warmth at times = was worked up by Dr Jefm Bryant in the past   She feels sensation of "need to take a good deep breath"   Nose feels boggy- but nothing comes out when she blows it  Feels post nasal drip a lot  No gerd symptoms  Does clear her throat   She is on med for RA- wonders if the RA or med could cause these symptoms   She is going through a lot of stress - this could cause some symptoms  She is not seeing a counselor or talking to anyone  Does not have anyone to talk to  Not being abused or in danger or suicidal   Has never been through this much stress in her life   A lot of $$ issues -unsure if counseling would be covered   Quit smoking 8-9 years ago  Had smoked 30 years   Has been 2 years since last breathing test (Dr Jefm Bryant)- did it because of her "color" Thinks she may have had an obstructive pattern  Then saw pulm at [facility] Was on inhaler? What it was  Sounds like she has been dx with copd   Patient Active Problem List   Diagnosis Date Noted  . Shortness of breath 05/16/2014  . History of COPD 05/16/2014  . Stress reaction 05/16/2014  . Pedal edema 10/15/2013  . Pulmonary hypertension 10/10/2011  . Encounter for routine gynecological examination 07/11/2011  . Routine general medical examination at a health care facility 07/03/2011  . PAP SMEAR, ABNORMAL, ASCUS 06/17/2010  . HEMORRHOIDS, INTERNAL, WITH BLEEDING 03/15/2010  . HYPERTENSION 11/16/2006  . Rheumatoid arthritis(714.0) 11/16/2006  . OSTEOPOROSIS 11/16/2006   Past Medical History  Diagnosis Date  . Hypertension   . Osteoporosis   . Rheumatoid arthritis(714.0)     ? scleroderma  . Pulmonary hypertension    Past Surgical History  Procedure  Laterality Date  . Tubal ligation    . Leep  2005  . Surgery- tendon release in hand  2006  . Dexa  07/2005    OP hip- 3.0  . Colonosc neg  11/08   History  Substance Use Topics  . Smoking status: Former Research scientist (life sciences)  . Smokeless tobacco: Not on file     Comment: Quit in 8/07  . Alcohol Use: No   Family History  Problem Relation Age of Onset  . Heart disease Mother   . Diabetes Mother   . Hypertension Father   . Macular degeneration Father   . Dementia Father     early  . Arthritis Father     RA  . Alcohol abuse Father    No Known Allergies Current Outpatient Prescriptions on File Prior to Visit  Medication Sig Dispense Refill  . amLODipine (NORVASC) 5 MG tablet Take 1 tablet (5 mg total) by mouth daily. 90 tablet 3  . Aspirin (ADULT ASPIRIN LOW STRENGTH) 81 MG EC tablet Take 81 mg by mouth daily.      . Biotin 5000 MCG CAPS Take 1 capsule by mouth daily.    . Cholecalciferol (VITAMIN D3) 2000 UNITS capsule Take 2,000 Units by mouth daily.      . folic acid (  FOLVITE) 1 MG tablet Take 1 mg by mouth daily.     . hydrochlorothiazide (HYDRODIURIL) 25 MG tablet Take 1 tablet (25 mg total) by mouth daily. 90 tablet 3  . hydroxychloroquine (PLAQUENIL) 200 MG tablet Take 200 mg by mouth daily.    . methotrexate (RHEUMATREX) 2.5 MG tablet 6 tabs weekly ( 3 tabs on Sat and 3 tabs on Sun)    . Multiple Vitamin (MULTIVITAMIN) capsule Take 1 capsule by mouth daily.     No current facility-administered medications on file prior to visit.    Review of Systems Review of Systems  Constitutional: Negative for fever, appetite change, fatigue and unexpected weight change.  Eyes: Negative for pain and visual disturbance.  Respiratory: Negative for cough and wheezing/ pos for sob  Cardiovascular: Negative for cp or palpitations   neg for PND/ orthopnea or pedal edema  Gastrointestinal: Negative for nausea, diarrhea and constipation.  Genitourinary: Negative for urgency and frequency.  Skin:  Negative for pallor or rash   Neurological: Negative for weakness, light-headedness, numbness and headaches.  Hematological: Negative for adenopathy. Does not bruise/bleed easily.  Psychiatric/Behavioral: Negative for dysphoric mood. The patient is nervous/anxious , pos for stressors        Objective:   Physical Exam  Constitutional: She appears well-developed and well-nourished. No distress.  Slim and well appearing   HENT:  Head: Normocephalic and atraumatic.  Right Ear: External ear normal.  Left Ear: External ear normal.  Mouth/Throat: Oropharynx is clear and moist. No oropharyngeal exudate.  Boggy nares Scant clear rhinorrhea   Eyes: Conjunctivae and EOM are normal. Pupils are equal, round, and reactive to light. Right eye exhibits no discharge. Left eye exhibits no discharge. No scleral icterus.  Neck: Normal range of motion. Neck supple. No JVD present. Carotid bruit is not present. No thyromegaly present.  Cardiovascular: Normal rate, regular rhythm, normal heart sounds and intact distal pulses.  Exam reveals no gallop.   Pulmonary/Chest: Effort normal and breath sounds normal. No respiratory distress. She has no wheezes. She has no rales. She exhibits no tenderness.  Diffusely distant bs  No prolonged exp phase  Pulse ox 95%   Abdominal: Soft. Bowel sounds are normal. She exhibits no distension and no mass.  Musculoskeletal: She exhibits no edema.  No leg tenderness or palp cords   Lymphadenopathy:    She has no cervical adenopathy.  Neurological: She is alert. She has normal reflexes. No cranial nerve deficit. She exhibits normal muscle tone. Coordination normal.  Skin: Skin is warm and dry. No rash noted. No erythema. No pallor.  Psychiatric: Her speech is normal and behavior is normal. Thought content normal. Her mood appears anxious. Her affect is not blunt, not labile and not inappropriate.  Tearful when discussing stressors           Assessment & Plan:    Problem List Items Addressed This Visit      Other   History of COPD    Per pt -she has had w/u from Dr Jefm Bryant and then pulm for copd  Was given ? 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     Shortness of breath - Primary    With remote hx  of copd and also anxiety  EKG today  cxr today   Sent for pulm records  Consider spirometry or PFTs -pend results      Relevant Orders      DG Chest 2 View (Completed)   Stress reaction    Pt is tearful when disc stress This may play into sob (having to frequently take a deep breath when not exerting herself) Reviewed stressors/ coping techniques/symptoms/ support sources/ tx options and side effects in detail today  Recommend counseling -she is not ready     Other Visit Diagnoses    SOB (shortness of breath)        Relevant Orders       EKG 12-Lead (Completed)

## 2014-05-16 NOTE — Progress Notes (Signed)
Pre visit review using our clinic review tool, if applicable. No additional management support is needed unless otherwise documented below in the visit note. 

## 2014-05-16 NOTE — Patient Instructions (Signed)
EKG today Chest xray today  We will send for last pulmonary records   Will review notes and make a plan

## 2014-05-18 NOTE — Assessment & Plan Note (Addendum)
With remote hx of copd and also anxiety  EKG today - low voltage (? Pulmonary) and some T wave inv in pecordial leads  cxr today   Sent for pulm records  Consider spirometry or PFTs -pend results

## 2014-05-18 NOTE — Assessment & Plan Note (Signed)
Pt is tearful when disc stress This may play into sob (having to frequently take a deep breath when not exerting herself) Reviewed stressors/ coping techniques/symptoms/ support sources/ tx options and side effects in detail today  Recommend counseling -she is not ready

## 2014-05-18 NOTE — Assessment & Plan Note (Signed)
Per pt -she has had w/u from Dr Jefm Bryant and then pulm for copd  Was given ? 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

## 2014-05-20 ENCOUNTER — Telehealth: Payer: Self-pay | Admitting: Family Medicine

## 2014-05-20 NOTE — Telephone Encounter (Signed)
Patient returned your call.  Please call her on her cell phone.

## 2014-07-28 ENCOUNTER — Emergency Department: Payer: Self-pay | Admitting: Student

## 2014-07-30 ENCOUNTER — Telehealth: Payer: Self-pay | Admitting: Family Medicine

## 2014-07-30 ENCOUNTER — Encounter: Payer: Self-pay | Admitting: Family Medicine

## 2014-07-30 ENCOUNTER — Ambulatory Visit (INDEPENDENT_AMBULATORY_CARE_PROVIDER_SITE_OTHER): Payer: PRIVATE HEALTH INSURANCE | Admitting: Family Medicine

## 2014-07-30 VITALS — BP 120/82 | HR 78 | Temp 97.5°F | Ht 65.25 in | Wt 117.2 lb

## 2014-07-30 DIAGNOSIS — M545 Low back pain, unspecified: Secondary | ICD-10-CM

## 2014-07-30 DIAGNOSIS — R0781 Pleurodynia: Secondary | ICD-10-CM

## 2014-07-30 MED ORDER — TRAMADOL HCL 50 MG PO TABS: 25.0000 mg | ORAL_TABLET | Freq: Four times a day (QID) | ORAL | Status: DC | PRN

## 2014-07-30 MED ORDER — MELOXICAM 15 MG PO TABS: 15.0000 mg | ORAL_TABLET | Freq: Every day | ORAL | Status: DC

## 2014-07-30 MED ORDER — TIZANIDINE HCL 2 MG PO TABS: 2.0000 mg | ORAL_TABLET | Freq: Three times a day (TID) | ORAL | Status: DC | PRN

## 2014-07-30 NOTE — Progress Notes (Signed)
Pre visit review using our clinic review tool, if applicable. No additional management support is needed unless otherwise documented below in the visit note. 

## 2014-07-30 NOTE — Telephone Encounter (Signed)
Patient Name: Carrie Hendricks  DOB: Jun 24, 1951    Initial Comment Caller states she fell on Monday was given a pain shot and inflammation shot. She is now having muscle pain needs to know if medication she was given is safe to take.    Nurse Assessment  Nurse: Leilani Merl, RN, Heather Date/Time (Eastern Time): 07/30/2014 8:56:56 AM  Confirm and document reason for call. If symptomatic, describe symptoms. ---Caller states that she fell on Monday down the steps and hurt her upper back below her shoulder blade. She went to the ED and they gave her a pain and inflammation shot. They also gave her flexaril and toradol prescriptions, she is wondering if they are safe to take.  Has the patient traveled out of the country within the last 30 days? ---Not Applicable  Does the patient require triage? ---Yes  Related visit to physician within the last 2 weeks? ---Yes  Does the PT have any chronic conditions? (i.e. diabetes, asthma, etc.) ---Unknown     Guidelines    Guideline Title Affirmed Question Affirmed Notes  Back Injury [1] High-risk adult (e.g., age > 54, osteoporosis, chronic steroid use) AND [2] still hurts    Final Disposition User   See Physician within 24 Hours Standifer, RN, Water quality scientist    Comments  Appt made with Dr. Lorelei Pont at 10:15 am this morning.

## 2014-07-30 NOTE — Progress Notes (Signed)
Dr. Frederico Hamman T. Lynnmarie Lovett, MD, [facility] [place], [address] Phone: [phone] Fax: [phone]  07/30/2014  Patient: Carrie Hendricks, MRN: [id number], DOB: August 07, 1950, 64 y.o.  Primary Physician:  Loura Pardon, MD  Chief Complaint: Back Pain and Fall  Subjective:   Carrie Hendricks is a 64 y.o. very pleasant female patient who presents with the following:  The patient fell on July 28, 2014, at the bottom of her staircase and fell and hit her posterior ribs on the RIGHT, and additionally also hit her back.  She currently is not having any bruising or any swelling.  She has pain on the erector spinae complex bilaterally throughout most of her back, but the primary source of pain is on her RIGHT posterior ribs.  She actually went to the hospital yesterday at [facility], and she was seen there.  It sounds as if she was given a injection of Toradol, and some meals.  She felt okay for about 1 day, and her pain is fully come back.  I gave her a prescription for some Flexeril and oral Toradol.  [place] on the second from the bottom and fell on Monday night.  R posterior rib fracture Back pain  Past Medical History, Surgical History, Social History, Family History, Problem List, Medications, and Allergies have been reviewed and updated if relevant.  GEN: No fevers, chills. Nontoxic. Primarily MSK c/o today. MSK: Detailed in the HPI GI: tolerating PO intake without difficulty Neuro: No numbness, parasthesias, or tingling associated. Otherwise the pertinent positives of the ROS are noted above.   Objective:   BP 120/82 mmHg  Pulse 78  Temp(Src) 97.5 F (36.4 C) (Oral)  Ht 5' 5.25" (1.657 m)  Wt 117 lb 4 oz (53.184 kg)  BMI 19.37 kg/m2   GEN: WDWN, NAD, Non-toxic, Alert & Oriented x 3 HEENT: Atraumatic, Normocephalic.  Ears and Nose: No external deformity. EXTR: No clubbing/cyanosis/edema NEURO: Normal gait.  PSYCH:  Normally interactive. Conversant. Not depressed or anxious appearing.  Calm demeanor.    The patient is nontender along the length of the spine.  She has some modest muscle spasm throughout her paraspinal musculature.  She also has some pain at the SI joints.  Hips move well without any difficulty or pain.  Neurovascularly intact throughout.  On the patient's RIGHT ribs approximately on rib 9 or 10 posteriorly on the RIGHT, the patient has a dramatic amount of pain with palpation.   Radiology: No results found.  Assessment and Plan:   Rib pain on right side  Bilateral low back pain without sciatica  Almost certainly the patient has one or 2 rib fractures on the RIGHT.  I discussed with her, given rib x-rays have only approximately 50% sensitivity, I tend to treat these clinically based on my clinical suspicion.  Anticipate 4 weeks for recovery.  NSAIDs, and other supportive pain medications.  The patient actually called today, and she was having some nausea this morning.  My suspicion is that this is from the tramadol, so I asked her to hold this and add Tylenol if she needs it for additional pain.  Follow-up: No Follow-up on file.  New Prescriptions   MELOXICAM (MOBIC) 15 MG TABLET    Take 1 tablet (15 mg total) by mouth daily.   TIZANIDINE (ZANAFLEX) 2 MG TABLET    Take 1 tablet (2 mg total) by mouth every 8 (eight) hours as needed for muscle spasms.  TRAMADOL (ULTRAM) 50 MG TABLET    Take 0.5-1 tablets (25-50 mg total) by mouth every 6 (six) hours as needed.   No orders of the defined types were placed in this encounter.    Signed,  Maud Deed. Leata Dominy, MD   Patient's Medications  New Prescriptions   MELOXICAM (MOBIC) 15 MG TABLET    Take 1 tablet (15 mg total) by mouth daily.   TIZANIDINE (ZANAFLEX) 2 MG TABLET    Take 1 tablet (2 mg total) by mouth every 8 (eight) hours as needed for muscle spasms.   TRAMADOL (ULTRAM) 50 MG TABLET    Take 0.5-1 tablets (25-50 mg total)  by mouth every 6 (six) hours as needed.  Previous Medications   AMLODIPINE (NORVASC) 5 MG TABLET    Take 1 tablet (5 mg total) by mouth daily.   ASPIRIN (ADULT ASPIRIN LOW STRENGTH) 81 MG EC TABLET    Take 81 mg by mouth daily.     BIOTIN 5000 MCG CAPS    Take 1 capsule by mouth daily.   CHOLECALCIFEROL (VITAMIN D3) 2000 UNITS CAPSULE    Take 2,000 Units by mouth daily.     FOLIC ACID (FOLVITE) 1 MG TABLET    Take 1 mg by mouth daily.    HYDROCHLOROTHIAZIDE (HYDRODIURIL) 25 MG TABLET    Take 1 tablet (25 mg total) by mouth daily.   HYDROXYCHLOROQUINE (PLAQUENIL) 200 MG TABLET    Take 200 mg by mouth daily.   METHOTREXATE (RHEUMATREX) 2.5 MG TABLET    6 tabs weekly ( 3 tabs on Sat and 3 tabs on Sun)   MULTIPLE VITAMIN (MULTIVITAMIN) CAPSULE    Take 1 capsule by mouth daily.  Modified Medications   No medications on file  Discontinued Medications   DILTIAZEM (TIAZAC) 180 MG 24 HR CAPSULE    Take by mouth.

## 2014-07-31 ENCOUNTER — Telehealth: Payer: Self-pay

## 2014-07-31 NOTE — Telephone Encounter (Signed)
Spoke with Anderson Malta (daughter) and advised her to have her mom hold the meloxicam tomorrow per Dr. Diona Browner until we here back from Dr. Lorelei Pont.  Anderson Malta states that her mom is vomiting and can not keep anything down. Message received earlier did not say anything about vomiting.  Any other recommendations?

## 2014-07-31 NOTE — Telephone Encounter (Signed)
Pt left note; pt saw Dr Lorelei Pont on 07/30/14; pt took meds prescribed on 07/30/14 this morning and pt has had severe nausea since taking meds. Pt was given Meloxicam,tizanidine and tramadol on 07/30/14. Pt request suppository for nausea to [facility]. Pt request cb.Please advise.

## 2014-07-31 NOTE — Telephone Encounter (Signed)
Instead of adding medication  for nauseaI would hold meloxicam as could be irritating stomach.Then await Dr. Serita Grit further recommendations... He would likely respond by tomorrow.

## 2014-07-31 NOTE — Telephone Encounter (Signed)
Left message for Carrie Hendricks at home number to stop tramadol per Dr. Lorelei Pont.  Take the meloxicam and tizanidine and if needed can add OTC tylenol for pain.  Dr. Lorelei Pont feels that stopping the tramadol will take care of the nausea and vomiting.

## 2014-07-31 NOTE — Telephone Encounter (Signed)
I would think that almost certainly this is from the tramadol. I would just stop this.  Taking Meloxicam and tizanidine should probably be fine and all she needs for pain. Can add OTC tylenol, too.   I would just d/c the tramadol and see if this clears it up.

## 2014-11-01 ENCOUNTER — Encounter: Payer: Self-pay | Admitting: Family Medicine

## 2014-11-03 MED ORDER — HYDROCHLOROTHIAZIDE 25 MG PO TABS: 25.0000 mg | ORAL_TABLET | Freq: Every day | ORAL | Status: DC

## 2015-01-27 ENCOUNTER — Other Ambulatory Visit: Payer: Self-pay | Admitting: Family Medicine

## 2015-01-27 NOTE — Telephone Encounter (Signed)
Left voicemail requesting pt to call office 

## 2015-01-27 NOTE — Telephone Encounter (Signed)
Please schedule f/u in Nov and refill until then thanks

## 2015-01-27 NOTE — Telephone Encounter (Signed)
Electronic refill request, no recent/future appt., please advise  

## 2015-01-29 ENCOUNTER — Encounter: Payer: Self-pay | Admitting: Family Medicine

## 2015-02-02 NOTE — Telephone Encounter (Signed)
Pt scheduled appt through mychart, med refilled

## 2015-04-11 ENCOUNTER — Other Ambulatory Visit: Payer: Self-pay | Admitting: Family Medicine

## 2015-04-11 MED ORDER — HYDROCHLOROTHIAZIDE 25 MG PO TABS: 25.0000 mg | ORAL_TABLET | Freq: Every day | ORAL | Status: DC

## 2015-05-13 ENCOUNTER — Ambulatory Visit: Payer: Self-pay | Admitting: Family Medicine

## 2015-05-15 ENCOUNTER — Ambulatory Visit (INDEPENDENT_AMBULATORY_CARE_PROVIDER_SITE_OTHER): Payer: PRIVATE HEALTH INSURANCE | Admitting: Family Medicine

## 2015-05-15 ENCOUNTER — Encounter: Payer: Self-pay | Admitting: Family Medicine

## 2015-05-15 VITALS — BP 134/78 | HR 81 | Temp 97.8°F | Ht 65.0 in | Wt 114.0 lb

## 2015-05-15 DIAGNOSIS — I272 Other secondary pulmonary hypertension: Secondary | ICD-10-CM | POA: Diagnosis not present

## 2015-05-15 DIAGNOSIS — Z0000 Encounter for general adult medical examination without abnormal findings: Secondary | ICD-10-CM | POA: Diagnosis not present

## 2015-05-15 DIAGNOSIS — I10 Essential (primary) hypertension: Secondary | ICD-10-CM

## 2015-05-15 DIAGNOSIS — M810 Age-related osteoporosis without current pathological fracture: Secondary | ICD-10-CM

## 2015-05-15 MED ORDER — HYDROCHLOROTHIAZIDE 25 MG PO TABS: 25.0000 mg | ORAL_TABLET | Freq: Every day | ORAL | Status: DC

## 2015-05-15 MED ORDER — AMLODIPINE BESYLATE 5 MG PO TABS: 5.0000 mg | ORAL_TABLET | Freq: Every day | ORAL | Status: DC

## 2015-05-15 NOTE — Progress Notes (Signed)
Subjective:    Patient ID: Carrie Hendricks, female    DOB: 1951/04/09, 64 y.o.   MRN: [id number]  HPI Here for health maintenance exam and to review chronic medical problems    Doing well overall Stays tired  Back on enbrel  (instead of methotrexate)  Hopeful that will help - has financial help also    Wt is down 3 lb with bmi of 35 Was trying to stay slim  She eats small portions  Eats hummus    Hep C/HIV screening -has been tested more than once -neg (rheum meds)  Zoster vaccine - cannot have due to current meds   Flu vaccine - just had one in early nov   Mm 10/15 neg- has not had  Self exam- no lumps   Pap nl 9/15- on an every 2 year schedule now  Has had LEEP in the past Last ascus pap 2013 with several nl 6 mo f/u  Td 12/11  colonosc 11/08 nl - 10 year recall   dexa 1/07 - OP (T score -3.0)- she wants to have her dexa at Dr Scharlene Gloss office  ?D level Has had a fall -may have fractured a few ribs   She was prev on fosamax - she stopped it (she will start it back)  She takes ca and D  Fall- slipped on the stairs /in socks    bp is stable today  No cp or palpitations or headaches or edema  No side effects to medicines  BP Readings from Last 3 Encounters:  05/15/15 134/78  07/30/14 120/82  05/16/14 126/84     Hx of pulmonary HTN   Due for labs   Patient Active Problem List   Diagnosis Date Noted  . Shortness of breath 05/16/2014  . History of COPD 05/16/2014  . Stress reaction 05/16/2014  . Pedal edema 10/15/2013  . Pulmonary hypertension ([place]) 10/10/2011  . Encounter for routine gynecological examination 07/11/2011  . Routine general medical examination at a health care facility 07/03/2011  . PAP SMEAR, ABNORMAL, ASCUS 06/17/2010  . HEMORRHOIDS, INTERNAL, WITH BLEEDING 03/15/2010  . HYPERTENSION 11/16/2006  . Rheumatoid arthritis(714.0) 11/16/2006  . OSTEOPOROSIS 11/16/2006   Past Medical History  Diagnosis Date  . Hypertension   .  Osteoporosis   . Rheumatoid arthritis(714.0)     ? scleroderma  . Pulmonary hypertension [facility])    Past Surgical History  Procedure Laterality Date  . Tubal ligation    . Leep  2005  . Surgery- tendon release in hand  2006  . Dexa  07/2005    OP hip- 3.0  . Colonosc neg  11/08   Social History  Substance Use Topics  . Smoking status: Former Research scientist (life sciences)  . Smokeless tobacco: Never Used     Comment: Quit in 8/07  . Alcohol Use: No   Family History  Problem Relation Age of Onset  . Heart disease Mother   . Diabetes Mother   . Hypertension Father   . Macular degeneration Father   . Dementia Father     early  . Arthritis Father     RA  . Alcohol abuse Father    No Known Allergies Current Outpatient Prescriptions on File Prior to Visit  Medication Sig Dispense Refill  . amLODipine (NORVASC) 5 MG tablet Take 1 tablet (5 mg total) by mouth daily. 90 tablet 3  . Aspirin (ADULT ASPIRIN LOW STRENGTH) 81 MG EC tablet Take 81 mg by mouth daily.      Marland Kitchen  Biotin 5000 MCG CAPS Take 1 capsule by mouth daily.    . Cholecalciferol (VITAMIN D3) 2000 UNITS capsule Take 2,000 Units by mouth daily.      . folic acid (FOLVITE) 1 MG tablet Take 1 mg by mouth daily.     . hydrochlorothiazide (HYDRODIURIL) 25 MG tablet Take 1 tablet (25 mg total) by mouth daily. 90 tablet 0  . hydroxychloroquine (PLAQUENIL) 200 MG tablet Take 200 mg by mouth daily.    . Multiple Vitamin (MULTIVITAMIN) capsule Take 1 capsule by mouth daily.     No current facility-administered medications on file prior to visit.     Review of Systems Review of Systems  Constitutional: Negative for fever, appetite change, fatigue and unexpected weight change.  Eyes: Negative for pain and visual disturbance.  Respiratory: Negative for cough and shortness of breath.   Cardiovascular: Negative for cp or palpitations    Gastrointestinal: Negative for nausea, diarrhea and constipation.  Genitourinary: Negative for urgency and frequency.    Skin: Negative for pallor or rash   MSK pos for joint pain from RA Neurological: Negative for weakness, light-headedness, numbness and headaches.  Hematological: Negative for adenopathy. Does not bruise/bleed easily.  Psychiatric/Behavioral: Negative for dysphoric mood. The patient is not nervous/anxious.         Objective:   Physical Exam  Constitutional: She appears well-developed and well-nourished. No distress.  Slim and well appearing   HENT:  Head: Normocephalic and atraumatic.  Right Ear: External ear normal.  Left Ear: External ear normal.  Mouth/Throat: Oropharynx is clear and moist.  Eyes: Conjunctivae and EOM are normal. Pupils are equal, round, and reactive to light. No scleral icterus.  Neck: Normal range of motion. Neck supple. No JVD present. Carotid bruit is not present. No thyromegaly present.  Cardiovascular: Normal rate, regular rhythm, normal heart sounds and intact distal pulses.  Exam reveals no gallop.   Pulmonary/Chest: Effort normal and breath sounds normal. No respiratory distress. She has no wheezes. She exhibits no tenderness.  Good air exch No crackles   Abdominal: Soft. Bowel sounds are normal. She exhibits no distension, no abdominal bruit and no mass. There is no tenderness.  Genitourinary: No breast swelling, tenderness, discharge or bleeding.  Breast exam: No mass, nodules, thickening, tenderness, bulging, retraction, inflamation, nipple discharge or skin changes noted.  No axillary or clavicular LA.      Musculoskeletal: Normal range of motion. She exhibits no edema or tenderness.  No kyphosis Small frame   Lymphadenopathy:    She has no cervical adenopathy.  Neurological: She is alert. She has normal reflexes. No cranial nerve deficit. She exhibits normal muscle tone. Coordination normal.  Skin: Skin is warm and dry. No rash noted. No erythema. No pallor.  Some SKs  Psychiatric: She has a normal mood and affect.          Assessment &  Plan:   Problem List Items Addressed This Visit      Cardiovascular and Mediastinum   Essential hypertension    bp in fair control at this time  BP Readings from Last 1 Encounters:  05/15/15 134/78   No changes needed Disc lifstyle change with low sodium diet and exercise  Labs today      Relevant Medications   hydrochlorothiazide (HYDRODIURIL) 25 MG tablet   amLODipine (NORVASC) 5 MG tablet   Pulmonary hypertension (HCC)    Stable without change or problems  Good exercise tolerance       Relevant Medications  hydrochlorothiazide (HYDRODIURIL) 25 MG tablet   amLODipine (NORVASC) 5 MG tablet     Musculoskeletal and Integument   Osteoporosis    Due for dexa- she will d/w her rheumatologist and get it at that office  Enc to go back on fosamax since it did not give her side eff Disc need for calcium/ vitamin D/ wt bearing exercise and bone density test every 2 y to monitor Disc safety/ fracture risk in detail          Relevant Orders   VITAMIN D 25 Hydroxy (Vit-D Deficiency, Fractures) (Completed)     Other   Routine general medical examination at a health care facility - Primary    Reviewed health habits including diet and exercise and skin cancer prevention Reviewed appropriate screening tests for age  Also reviewed health mt list, fam hx and immunization status , as well as social and family history   See HPI Lab today  Last paps were nl- will plan that every other year  Make sure to talk to Dr Jefm Bryant about bone density test  Don't forget to schedule your mammogram when you get a chance  Take care of yourself       Relevant Orders   CBC with Differential/Platelet (Completed)   Comprehensive metabolic panel (Completed)   TSH (Completed)   Lipid panel (Completed)

## 2015-05-15 NOTE — Patient Instructions (Signed)
Labs today  Make sure to talk to Dr Jefm Bryant about bone density test  Don't forget to schedule your mammogram when you get a chance  Take care of yourself

## 2015-05-15 NOTE — Progress Notes (Signed)
Pre visit review using our clinic review tool, if applicable. No additional management support is needed unless otherwise documented below in the visit note. 

## 2015-05-16 LAB — TSH: TSH: 1.262 u[IU]/mL (ref 0.350–4.500)

## 2015-05-16 LAB — COMPREHENSIVE METABOLIC PANEL
ALBUMIN: 3.9 g/dL (ref 3.6–5.1)
ALT: 20 U/L (ref 6–29)
AST: 22 U/L (ref 10–35)
Alkaline Phosphatase: 51 U/L (ref 33–130)
BUN: 14 mg/dL (ref 7–25)
CO2: 28 mmol/L (ref 20–31)
CREATININE: 0.91 mg/dL (ref 0.50–0.99)
Calcium: 9.8 mg/dL (ref 8.6–10.4)
Chloride: 101 mmol/L (ref 98–110)
Glucose, Bld: 100 mg/dL — ABNORMAL HIGH (ref 65–99)
Potassium: 3.6 mmol/L (ref 3.5–5.3)
SODIUM: 142 mmol/L (ref 135–146)
TOTAL PROTEIN: 6.7 g/dL (ref 6.1–8.1)
Total Bilirubin: 0.7 mg/dL (ref 0.2–1.2)

## 2015-05-16 LAB — CBC WITH DIFFERENTIAL/PLATELET
Basophils Absolute: 0.1 10*3/uL (ref 0.0–0.1)
Basophils Relative: 1 % (ref 0–1)
EOS ABS: 0.2 10*3/uL (ref 0.0–0.7)
Eosinophils Relative: 3 % (ref 0–5)
HCT: 43.2 % (ref 36.0–46.0)
Hemoglobin: 14.9 g/dL (ref 12.0–15.0)
Lymphocytes Relative: 32 % (ref 12–46)
Lymphs Abs: 1.9 10*3/uL (ref 0.7–4.0)
MCH: 32.9 pg (ref 26.0–34.0)
MCHC: 34.5 g/dL (ref 30.0–36.0)
MCV: 95.4 fL (ref 78.0–100.0)
MPV: 10.8 fL (ref 8.6–12.4)
Monocytes Absolute: 0.5 10*3/uL (ref 0.1–1.0)
Monocytes Relative: 9 % (ref 3–12)
NEUTROS PCT: 55 % (ref 43–77)
Neutro Abs: 3.2 10*3/uL (ref 1.7–7.7)
PLATELETS: 199 10*3/uL (ref 150–400)
RBC: 4.53 MIL/uL (ref 3.87–5.11)
RDW: 12.1 % (ref 11.5–15.5)
WBC: 5.8 10*3/uL (ref 4.0–10.5)

## 2015-05-16 LAB — LIPID PANEL
CHOL/HDL RATIO: 2.5 ratio (ref <=5.0)
CHOLESTEROL: 177 mg/dL (ref 125–200)
HDL: 70 mg/dL (ref >=46)
LDL Cholesterol: 93 mg/dL (ref <130)
Triglycerides: 69 mg/dL (ref <150)
VLDL: 14 mg/dL (ref <30)

## 2015-05-16 LAB — VITAMIN D 25 HYDROXY (VIT D DEFICIENCY, FRACTURES): Vit D, 25-Hydroxy: 67 ng/mL (ref 30–100)

## 2015-05-16 NOTE — Assessment & Plan Note (Signed)
Due for dexa- she will d/w her rheumatologist and get it at that office  Enc to go back on fosamax since it did not give her side eff Disc need for calcium/ vitamin D/ wt bearing exercise and bone density test every 2 y to monitor Disc safety/ fracture risk in detail

## 2015-05-16 NOTE — Assessment & Plan Note (Signed)
Reviewed health habits including diet and exercise and skin cancer prevention Reviewed appropriate screening tests for age  Also reviewed health mt list, fam hx and immunization status , as well as social and family history   See HPI Lab today  Last paps were nl- will plan that every other year  Make sure to talk to Dr Jefm Bryant about bone density test  Don't forget to schedule your mammogram when you get a chance  Take care of yourself

## 2015-05-16 NOTE — Assessment & Plan Note (Signed)
Stable without change or problems  Good exercise tolerance

## 2015-05-16 NOTE — Assessment & Plan Note (Signed)
bp in fair control at this time  BP Readings from Last 1 Encounters:  05/15/15 134/78   No changes needed Disc lifstyle change with low sodium diet and exercise  Labs today

## 2015-06-05 ENCOUNTER — Other Ambulatory Visit: Payer: Self-pay | Admitting: Family Medicine

## 2015-06-05 DIAGNOSIS — Z1231 Encounter for screening mammogram for malignant neoplasm of breast: Secondary | ICD-10-CM

## 2015-06-16 ENCOUNTER — Ambulatory Visit
Admission: RE | Admit: 2015-06-16 | Discharge: 2015-06-16 | Disposition: A | Discharge status: Home / Self Care | Payer: PRIVATE HEALTH INSURANCE | Source: Ambulatory Visit | Attending: Family Medicine | Admitting: Family Medicine

## 2015-06-16 DIAGNOSIS — Z1231 Encounter for screening mammogram for malignant neoplasm of breast: Secondary | ICD-10-CM | POA: Diagnosis present

## 2015-12-08 ENCOUNTER — Telehealth: Payer: Self-pay | Admitting: Family Medicine

## 2015-12-08 NOTE — Telephone Encounter (Signed)
error 

## 2015-12-14 ENCOUNTER — Encounter: Payer: Self-pay | Admitting: Family Medicine

## 2015-12-22 DIAGNOSIS — M545 Low back pain: Secondary | ICD-10-CM | POA: Diagnosis not present

## 2015-12-22 DIAGNOSIS — M0579 Rheumatoid arthritis with rheumatoid factor of multiple sites without organ or systems involvement: Secondary | ICD-10-CM | POA: Diagnosis not present

## 2015-12-22 DIAGNOSIS — M810 Age-related osteoporosis without current pathological fracture: Secondary | ICD-10-CM | POA: Diagnosis not present

## 2015-12-22 DIAGNOSIS — I7300 Raynaud's syndrome without gangrene: Secondary | ICD-10-CM | POA: Diagnosis not present

## 2015-12-22 DIAGNOSIS — G8929 Other chronic pain: Secondary | ICD-10-CM | POA: Diagnosis not present

## 2015-12-22 DIAGNOSIS — M359 Systemic involvement of connective tissue, unspecified: Secondary | ICD-10-CM | POA: Diagnosis not present

## 2015-12-23 ENCOUNTER — Telehealth: Payer: Self-pay

## 2015-12-23 MED ORDER — AMLODIPINE BESYLATE 5 MG PO TABS: 5.0000 mg | ORAL_TABLET | Freq: Every day | ORAL | Status: DC

## 2015-12-23 MED ORDER — HYDROCHLOROTHIAZIDE 25 MG PO TABS: 25.0000 mg | ORAL_TABLET | Freq: Every day | ORAL | Status: DC

## 2015-12-23 NOTE — Telephone Encounter (Signed)
Pt has gone on medicare and needs amlodipine and HCTZ sent to [place]. Pt advised done per protocol.

## 2016-03-02 ENCOUNTER — Ambulatory Visit (INDEPENDENT_AMBULATORY_CARE_PROVIDER_SITE_OTHER): Payer: Commercial Managed Care - HMO | Admitting: Family Medicine

## 2016-03-02 ENCOUNTER — Encounter: Payer: Self-pay | Admitting: Family Medicine

## 2016-03-02 VITALS — BP 124/68 | HR 73 | Temp 98.0°F | Ht 65.0 in | Wt 108.0 lb

## 2016-03-02 DIAGNOSIS — K5900 Constipation, unspecified: Secondary | ICD-10-CM | POA: Insufficient documentation

## 2016-03-02 DIAGNOSIS — R600 Localized edema: Secondary | ICD-10-CM | POA: Diagnosis not present

## 2016-03-02 DIAGNOSIS — R636 Underweight: Secondary | ICD-10-CM | POA: Insufficient documentation

## 2016-03-02 NOTE — Progress Notes (Signed)
Subjective:    Patient ID: Carrie Hendricks, female    DOB: 01/03/1951, 65 y.o.   MRN: [id number]  HPI  Here with GI symptoms   abd pain after eating - and bloating - both sides under umbilicus   a lot of gas  Not on abx lately  Has some blood in stool  Her stools are elongated in general   Had cut back on eating and lost her appetite    She tried align for a few weeks on her own and it did not help She tried a laxative -that helped - used a correctol - just one pill -   May not get enough fluid  Has a cup of coffee in the am  Then a bottle of diet green tea- will not finish  Bottle of water-small  Perhaps another green tea  She does take hctz    Had a colonoscopy 11/08 normal  Hx of internal hemorrhoids     Wt Readings from Last 3 Encounters:  03/02/16 108 lb (49 kg)  05/15/15 114 lb (51.7 kg)  07/30/14 117 lb 4 oz (53.2 kg)  she plans to do better and start cooking again  bmi is 17.9 She does not eat fruits  Recently stopped cooking and tends to eat quick things like frozen meals   Moved in to a condo Was in a funk for a while  This is getting better however   RA is pretty bad-may need surgery on her wrist   Breathing is better off of MTX and hair is not coming out   Patient Active Problem List   Diagnosis Date Noted  . Constipation 03/02/2016  . Underweight 03/02/2016  . Shortness of breath 05/16/2014  . History of COPD 05/16/2014  . Stress reaction 05/16/2014  . Pedal edema 10/15/2013  . Pulmonary hypertension ([place]) 10/10/2011  . Encounter for routine gynecological examination 07/11/2011  . Routine general medical examination at a health care facility 07/03/2011  . PAP SMEAR, ABNORMAL, ASCUS 06/17/2010  . HEMORRHOIDS, INTERNAL, WITH BLEEDING 03/15/2010  . Essential hypertension 11/16/2006  . Rheumatoid arthritis(714.0) 11/16/2006  . Osteoporosis 11/16/2006   Past Medical History:  Diagnosis Date  . Hypertension   . Osteoporosis   .  Pulmonary hypertension ([place])   . Rheumatoid arthritis(714.0)    ? scleroderma   Past Surgical History:  Procedure Laterality Date  . colonosc neg  11/08  . Dexa  07/2005   OP hip- 3.0  . LEEP  2005  . Surgery- tendon release in hand  2006  . TUBAL LIGATION     Social History  Substance Use Topics  . Smoking status: Former Research scientist (life sciences)  . Smokeless tobacco: Never Used     Comment: Quit in 8/07  . Alcohol use No   Family History  Problem Relation Age of Onset  . Heart disease Mother   . Diabetes Mother   . Hypertension Father   . Macular degeneration Father   . Dementia Father     early  . Arthritis Father     RA  . Alcohol abuse Father   . Breast cancer Neg Hx    No Known Allergies Current Outpatient Prescriptions on File Prior to Visit  Medication Sig Dispense Refill  . amLODipine (NORVASC) 5 MG tablet Take 1 tablet (5 mg total) by mouth daily. 90 tablet 1  . Aspirin (ADULT ASPIRIN LOW STRENGTH) 81 MG EC tablet Take 81 mg by mouth daily.      Marland Kitchen  Biotin 5000 MCG CAPS Take 1 capsule by mouth daily.    . Cholecalciferol (VITAMIN D3) 2000 UNITS capsule Take 2,000 Units by mouth daily.      Marland Kitchen etanercept (ENBREL SURECLICK) 50 MG/ML injection Inject 50 mg into the skin once a week.    . folic acid (FOLVITE) 1 MG tablet Take 1 mg by mouth daily.     . hydrochlorothiazide (HYDRODIURIL) 25 MG tablet Take 1 tablet (25 mg total) by mouth daily. 90 tablet 1  . hydroxychloroquine (PLAQUENIL) 200 MG tablet Take 200 mg by mouth daily.    . Multiple Vitamin (MULTIVITAMIN) capsule Take 1 capsule by mouth daily.     No current facility-administered medications on file prior to visit.     Review of Systems    Review of Systems  Constitutional: Negative for fever, , fatigue and unexpected weight change. pos for dec in appetite  Eyes: Negative for pain and visual disturbance.  Respiratory: Negative for cough and shortness of breath.   Cardiovascular: Negative for cp or palpitations      Gastrointestinal: Negative for nausea, diarrhea and pos for  constipation.  Genitourinary: Negative for urgency and frequency.  Skin: Negative for pallor or rash   Neurological: Negative for weakness, light-headedness, numbness and headaches.  Hematological: Negative for adenopathy. Does not bruise/bleed easily.  Psychiatric/Behavioral: pos for dysphoric mood that is now improving , neg for SI    Objective:   Physical Exam  Constitutional: She appears well-developed and well-nourished. No distress.  Underweight female in good spirits   HENT:  Head: Normocephalic and atraumatic.  Mouth/Throat: Oropharynx is clear and moist.  Eyes: Conjunctivae and EOM are normal. Pupils are equal, round, and reactive to light. No scleral icterus.  Neck: Normal range of motion. Neck supple.  Cardiovascular: Normal rate, regular rhythm and normal heart sounds.   Pulmonary/Chest: Effort normal and breath sounds normal. No respiratory distress. She has no wheezes. She has no rales.  Abdominal: Soft. Bowel sounds are normal. She exhibits no distension, no abdominal bruit, no pulsatile midline mass and no mass. There is no hepatosplenomegaly. There is no tenderness. There is no rebound, no guarding and no CVA tenderness.  Musculoskeletal: She exhibits no edema.  Lymphadenopathy:    She has no cervical adenopathy.  Neurological: She is alert. No cranial nerve deficit. She exhibits normal muscle tone. Coordination normal.  Skin: Skin is warm and dry. No erythema. No pallor.  No jaundice   Psychiatric: She has a normal mood and affect.  Pleasant and talkative           Assessment & Plan:   Problem List Items Addressed This Visit      Digestive   Constipation    Pt exp abd pain after eating (lower abd) with hard to pass stools Much improved with one dose of a laxative Long disc re inc fluid intake (very low currently) and also calorie intake with more fresh fruit and veg  Will inc fluids to 8-12  servings per day  Also miralax daily for a week and then prn  If no improvement- will update  Is due for screening colonoscopy in 2018  Would ref to GI if no further imp in symptoms         Other   Underweight    Poor po intake since moving and not cooking Made a plan for better diet - with protien/ 3 meals per day and much more fresh or frozen produce  She agrees  Could  try meal supplements if needed but pt thinks she will do well prepping food on her own       Pedal edema    This may worsen with less fluid intake and warm weather  Will continue support socks Inc fluids to 8-12 servings per day       Other Visit Diagnoses   None.

## 2016-03-02 NOTE — Assessment & Plan Note (Signed)
Pt exp abd pain after eating (lower abd) with hard to pass stools Much improved with one dose of a laxative Long disc re inc fluid intake (very low currently) and also calorie intake with more fresh fruit and veg  Will inc fluids to 8-12 servings per day  Also miralax daily for a week and then prn  If no improvement- will update  Is due for screening colonoscopy in 2018  Would ref to GI if no further imp in symptoms

## 2016-03-02 NOTE — Patient Instructions (Addendum)
I think constipation was causing a lot of your symptoms  You need to drink 8-12 servings of fluids per day -mostly water and juices  Also buy fresh or frozen produce and start assembling some fresh meals  Get protein with every meal as well ( meat/ dairy/nuts /nut butter/soy/fish)  Get miralax over the counter- take 1 serving per day for a week or as long as you need   Take care of yourself !  Schedule annual exam on the way out    If your symptoms worsen or do not improve further please let us know asap

## 2016-03-02 NOTE — Assessment & Plan Note (Signed)
Poor po intake since moving and not cooking Made a plan for better diet - with protien/ 3 meals per day and much more fresh or frozen produce  She agrees  Could try meal supplements if needed but pt thinks she will do well prepping food on her own

## 2016-03-02 NOTE — Progress Notes (Signed)
Pre visit review using our clinic review tool, if applicable. No additional management support is needed unless otherwise documented below in the visit note. 

## 2016-03-02 NOTE — Assessment & Plan Note (Signed)
This may worsen with less fluid intake and warm weather  Will continue support socks Inc fluids to 8-12 servings per day

## 2016-03-02 NOTE — Assessment & Plan Note (Signed)
Worse with recent constipation and now settling down  Will monitor

## 2016-03-04 ENCOUNTER — Ambulatory Visit: Payer: Self-pay | Admitting: Family Medicine

## 2016-04-28 ENCOUNTER — Other Ambulatory Visit: Payer: Self-pay | Admitting: Family Medicine

## 2016-05-25 ENCOUNTER — Ambulatory Visit: Payer: Commercial Managed Care - HMO

## 2016-05-25 ENCOUNTER — Other Ambulatory Visit (INDEPENDENT_AMBULATORY_CARE_PROVIDER_SITE_OTHER): Payer: Commercial Managed Care - HMO

## 2016-05-25 ENCOUNTER — Other Ambulatory Visit: Payer: Commercial Managed Care - HMO

## 2016-05-25 ENCOUNTER — Telehealth: Payer: Self-pay | Admitting: Family Medicine

## 2016-05-25 DIAGNOSIS — Z0000 Encounter for general adult medical examination without abnormal findings: Secondary | ICD-10-CM

## 2016-05-25 LAB — COMPREHENSIVE METABOLIC PANEL
ALBUMIN: 4 g/dL (ref 3.5–5.2)
ALK PHOS: 58 U/L (ref 39–117)
ALT: 19 U/L (ref 0–35)
AST: 21 U/L (ref 0–37)
BILIRUBIN TOTAL: 0.4 mg/dL (ref 0.2–1.2)
BUN: 14 mg/dL (ref 6–23)
CO2: 30 mEq/L (ref 19–32)
CREATININE: 0.83 mg/dL (ref 0.40–1.20)
Calcium: 9.8 mg/dL (ref 8.4–10.5)
Chloride: 100 mEq/L (ref 96–112)
GFR: 73.21 mL/min (ref >60.00)
GLUCOSE: 94 mg/dL (ref 70–99)
Potassium: 3.7 mEq/L (ref 3.5–5.1)
SODIUM: 138 meq/L (ref 135–145)
TOTAL PROTEIN: 7.5 g/dL (ref 6.0–8.3)

## 2016-05-25 LAB — CBC WITH DIFFERENTIAL/PLATELET
BASOS ABS: 0.1 10*3/uL (ref 0.0–0.1)
Basophils Relative: 1.4 % (ref 0.0–3.0)
EOS ABS: 0.6 10*3/uL (ref 0.0–0.7)
EOS PCT: 9.6 % — AB (ref 0.0–5.0)
HCT: 44.3 % (ref 36.0–46.0)
HEMOGLOBIN: 15.3 g/dL — AB (ref 12.0–15.0)
Lymphocytes Relative: 32.3 % (ref 12.0–46.0)
Lymphs Abs: 1.9 10*3/uL (ref 0.7–4.0)
MCHC: 34.5 g/dL (ref 30.0–36.0)
MCV: 95.2 fl (ref 78.0–100.0)
MONO ABS: 0.5 10*3/uL (ref 0.1–1.0)
Monocytes Relative: 7.9 % (ref 3.0–12.0)
Neutro Abs: 2.9 10*3/uL (ref 1.4–7.7)
Neutrophils Relative %: 48.8 % (ref 43.0–77.0)
Platelets: 237 10*3/uL (ref 150.0–400.0)
RBC: 4.66 Mil/uL (ref 3.87–5.11)
RDW: 12.9 % (ref 11.5–15.5)
WBC: 5.9 10*3/uL (ref 4.0–10.5)

## 2016-05-25 LAB — LIPID PANEL
CHOLESTEROL: 197 mg/dL (ref 0–200)
HDL: 64.3 mg/dL (ref >39.00)
LDL Cholesterol: 118 mg/dL — ABNORMAL HIGH (ref 0–99)
NONHDL: 132.73
Total CHOL/HDL Ratio: 3
Triglycerides: 76 mg/dL (ref 0.0–149.0)
VLDL: 15.2 mg/dL (ref 0.0–40.0)

## 2016-05-25 LAB — TSH: TSH: 1.51 u[IU]/mL (ref 0.35–4.50)

## 2016-05-25 NOTE — Telephone Encounter (Signed)
-----   Message from Eustace Pen, LPN sent at 14/04/3012  5:25 PM EST ----- Regarding: Lab Orders for 05/25/2016 Please place lab orders.   FYI. Cancelled AWV as pt needs Wel to Medicare Exam due to age.

## 2016-06-08 ENCOUNTER — Ambulatory Visit: Payer: Commercial Managed Care - HMO | Admitting: Family Medicine

## 2016-06-08 ENCOUNTER — Ambulatory Visit (INDEPENDENT_AMBULATORY_CARE_PROVIDER_SITE_OTHER): Payer: Commercial Managed Care - HMO | Admitting: Family Medicine

## 2016-06-08 ENCOUNTER — Encounter: Payer: Self-pay | Admitting: Family Medicine

## 2016-06-08 VITALS — BP 120/80 | HR 92 | Wt 109.0 lb

## 2016-06-08 DIAGNOSIS — R1013 Epigastric pain: Secondary | ICD-10-CM | POA: Diagnosis not present

## 2016-06-08 DIAGNOSIS — K5900 Constipation, unspecified: Secondary | ICD-10-CM

## 2016-06-08 DIAGNOSIS — R350 Frequency of micturition: Secondary | ICD-10-CM

## 2016-06-08 DIAGNOSIS — M545 Low back pain, unspecified: Secondary | ICD-10-CM

## 2016-06-08 LAB — POCT URINALYSIS DIP (MANUAL ENTRY)
BILIRUBIN UA: NEGATIVE
GLUCOSE UA: NEGATIVE
Ketones, POC UA: NEGATIVE
LEUKOCYTES UA: NEGATIVE
NITRITE UA: NEGATIVE
PH UA: 6
RBC UA: NEGATIVE
Spec Grav, UA: >=1.03
UROBILINOGEN UA: NEGATIVE

## 2016-06-08 NOTE — Progress Notes (Signed)
Subjective:    Patient ID: [name], female    DOB: [birth year], 65 y.o.   MRN: [id number]  HPI This is a 65 yo female who presents today with abdominal pain x 4 days, bloating, decreased appetite. Pain is midline, epigastric, feels bloated. Some relief with bowel movement. Sometimes feels constipated. She has had similar symptoms in the past. Bowel movements are of small caliber, some accidents. Has also had low back pain. Urinary frequency, no dysuria, no hematuria. Last colonoscopy 11/08, Dr. Sonny Masters.  Has been working on eating more. Toast and coffee in the morning, more toast at work. Lunch is a wrap or salad, dinner is often chicken fresh vegetables. Prior to abdominal pain was sleeping well. Energy level was good prior to abdominal pain starting. Has tried Activia, Mylanta, Miralax in past. Drinking more water- 6+ glasses a day. Does not notice a difference whether eating or not.   Has recently joined a gym and is walking on the treadmill and doing some strength work.    Past Medical History:  Diagnosis Date  . Hypertension   . Osteoporosis   . Pulmonary hypertension   . Rheumatoid arthritis(714.0)    ? scleroderma   Past Surgical History:  Procedure Laterality Date  . colonosc neg  11/08  . Dexa  07/2005   OP hip- 3.0  . LEEP  2005  . Surgery- tendon release in hand  2006  . TUBAL LIGATION     Family History  Problem Relation Age of Onset  . Heart disease Mother   . Diabetes Mother   . Hypertension Father   . Macular degeneration Father   . Dementia Father     early  . Arthritis Father     RA  . Alcohol abuse Father   . Breast cancer Neg Hx    Social History  Substance Use Topics  . Smoking status: Former Research scientist (life sciences)  . Smokeless tobacco: Never Used     Comment: Quit in 8/07  . Alcohol use No      Review of Systems Per HPI    Objective:   Physical Exam  Constitutional: She is oriented to person, place, and time. She appears well-developed.  thin    HENT:  Head: Normocephalic and atraumatic.  Cardiovascular: Normal rate, regular rhythm and normal heart sounds.   Pulmonary/Chest: Effort normal and breath sounds normal.  Abdominal: Soft. Bowel sounds are normal. She exhibits no distension. There is no tenderness. There is no rebound and no guarding.  Musculoskeletal: She exhibits no edema.  Neurological: She is alert and oriented to person, place, and time.  Skin: Skin is warm and dry.  Vitals reviewed.     BP 120/80   Pulse 92   Wt 109 lb (49.4 kg)   SpO2 92%   BMI 18.14 kg/m  Wt Readings from Last 3 Encounters:  06/08/16 109 lb (49.4 kg)  03/02/16 108 lb (49 kg)  05/15/15 114 lb (51.7 kg)   Results for orders placed or performed in visit on 06/08/16  POCT urinalysis dipstick  Result Value Ref Range   Color, UA yellow yellow   Clarity, UA clear clear   Glucose, UA negative negative   Bilirubin, UA negative negative   Ketones, POC UA negative negative   Spec Grav, UA >=1.030    Blood, UA negative negative   pH, UA 6.0    Protein Ur, POC trace (A) negative   Urobilinogen, UA negative    Nitrite, UA Negative Negative  Leukocytes, UA Negative Negative    Had labs done 05/25/16- nml cmet, tsh, cbc, vit d    Assessment & Plan:  1. Epigastric pain - POCT urinalysis dipstick - seems similar to previous episodes; no worrisome physical findings  2. Constipation, unspecified constipation type - patient is concerned about change in stool, is almost due for 10 year repeat colonscopy - Ambulatory referral to Gastroenterology - encouraged her to use Miralax regularly  3. Acute midline low back pain without sciatica - POCT urinalysis dipstick  4. Urinary frequency - POCT urinalysis dipstick   Clarene Reamer, FNP-BC  [facility], [place]  06/08/2016 1:56 PM

## 2016-06-08 NOTE — Patient Instructions (Addendum)
Please try Miralax for the next two evenings Can also use Gas-ex or Tylenol for gas pains Stop by and see Rosaria Ferries on your way out to schedule an appointment with gastroenterologist   Constipation, Adult Constipation is when a person has fewer bowel movements in a week than normal, has difficulty having a bowel movement, or has stools that are dry, hard, or larger than normal. Constipation may be caused by an underlying condition. It may become worse with age if a person takes certain medicines and does not take in enough fluids. Follow these instructions at home: Eating and drinking  Eat foods that have a lot of fiber, such as fresh fruits and vegetables, whole grains, and beans.  Limit foods that are high in fat, low in fiber, or overly processed, such as french fries, hamburgers, cookies, candies, and soda.  Drink enough fluid to keep your urine clear or pale yellow. General instructions  Exercise regularly or as told by your health care provider.  Go to the restroom when you have the urge to go. Do not hold it in.  Take over-the-counter and prescription medicines only as told by your health care provider. These include any fiber supplements.  Practice pelvic floor retraining exercises, such as deep breathing while relaxing the lower abdomen and pelvic floor relaxation during bowel movements.  Watch your condition for any changes.  Keep all follow-up visits as told by your health care provider. This is important. Contact a health care provider if:  You have pain that gets worse.  You have a fever.  You do not have a bowel movement after 4 days.  You vomit.  You are not hungry.  You lose weight.  You are bleeding from the anus.  You have thin, pencil-like stools. Get help right away if:  You have a fever and your symptoms suddenly get worse.  You leak stool or have blood in your stool.  Your abdomen is bloated.  You have severe pain in your abdomen.  You feel  dizzy or you faint. This information is not intended to replace advice given to you by your health care provider. Make sure you discuss any questions you have with your health care provider. Document Released: 03/18/2004 Document Revised: 01/08/2016 Document Reviewed: 12/09/2015 Elsevier Interactive Patient Education  2017 [employer].

## 2016-06-10 ENCOUNTER — Encounter: Payer: Self-pay | Admitting: Family Medicine

## 2016-06-10 ENCOUNTER — Ambulatory Visit (INDEPENDENT_AMBULATORY_CARE_PROVIDER_SITE_OTHER): Payer: Commercial Managed Care - HMO | Admitting: Family Medicine

## 2016-06-10 VITALS — BP 122/66 | HR 90 | Temp 97.5°F | Ht 65.0 in | Wt 105.2 lb

## 2016-06-10 DIAGNOSIS — R636 Underweight: Secondary | ICD-10-CM

## 2016-06-10 DIAGNOSIS — M810 Age-related osteoporosis without current pathological fracture: Secondary | ICD-10-CM

## 2016-06-10 DIAGNOSIS — Z0000 Encounter for general adult medical examination without abnormal findings: Secondary | ICD-10-CM | POA: Diagnosis not present

## 2016-06-10 DIAGNOSIS — I272 Pulmonary hypertension, unspecified: Secondary | ICD-10-CM | POA: Diagnosis not present

## 2016-06-10 DIAGNOSIS — E2839 Other primary ovarian failure: Secondary | ICD-10-CM | POA: Insufficient documentation

## 2016-06-10 DIAGNOSIS — I10 Essential (primary) hypertension: Secondary | ICD-10-CM | POA: Diagnosis not present

## 2016-06-10 DIAGNOSIS — H524 Presbyopia: Secondary | ICD-10-CM | POA: Diagnosis not present

## 2016-06-10 DIAGNOSIS — Z23 Encounter for immunization: Secondary | ICD-10-CM

## 2016-06-10 MED ORDER — PNEUMOCOCCAL 13-VAL CONJ VACC IM SUSP
0.5000 mL | INTRAMUSCULAR | Status: AC
  Administered 2016-06-10: 0.5 mL via INTRAMUSCULAR

## 2016-06-10 MED ORDER — AMLODIPINE BESYLATE 5 MG PO TABS: 5.0000 mg | ORAL_TABLET | Freq: Every day | ORAL | 3 refills | Status: DC

## 2016-06-10 MED ORDER — HYDROCHLOROTHIAZIDE 25 MG PO TABS: 25.0000 mg | ORAL_TABLET | Freq: Every day | ORAL | 3 refills | Status: DC

## 2016-06-10 NOTE — Patient Instructions (Addendum)
Makes sure to get protein with every meal  Stop at check out for ref for dexa  Don't forget to schedule your own mammogram - due soon  prevnar vaccine today  Start adding meal supplements in between meals (even when you are not hungry)  Keep working on your advance directive  Call and find out when you are due for a pulmonary appointment

## 2016-06-10 NOTE — Progress Notes (Signed)
Subjective:    Patient ID: Carrie Hendricks, female    DOB: [birth year], 65 y.o.   MRN: [id number]  HPI Here for welcome to medicare visit   I have personally reviewed the Medicare Annual Wellness questionnaire and have noted 1. The patient's medical and social history 2. Their use of alcohol, tobacco or illicit drugs 3. Their current medications and supplements 4. The patient's functional ability including ADL's, fall risks, home safety risks and hearing or visual             impairment. 5. Diet and physical activities 6. Evidence for depression or mood disorders  The patients weight, height, BMI have been recorded in the chart and visual acuity is per eye clinic.  I have made referrals, counseling and provided education to the patient based review of the above and I have provided the pt with a written personalized care plan for preventive services. Reviewed and updated provider list, see scanned forms.  See scanned forms.  Routine anticipatory guidance given to patient.  See health maintenance. Colon cancer screening 11/08 colonoscopy neg (has a colonoscopy set up)  dexa had 1/07 OP hip T -3/0 - wants to set that up - taking ca and D , last fracture several years ago/ribs, no falls  Breast cancer screening 12/16-negative, not set up yet  Self breast exam-no  Lumps  Flu vaccine 10/17 Tetanus vaccine 12/11 Pneumovax-due for prevnar - will do today  Zoster vaccine-declines  Pap was 9/15-nl (follow up have all been normal) --- all nl pap since ascus in 2013  EKG- changes consistent with her pulm HTN and COPD    (she is exercising more to help with her lung capacity)  Quit smoking in 2007 Advance directive she does not have POA or living will - is pre set up /working on it now  Cognitive function addressed- see scanned forms- and if abnormal then additional documentation follows.  No major problems or concerns  No hearing issues   EKG today with R axis deviation (consistent with  pulmonary dz) , and non specific T wave finding stable from previous   PMH and SH reviewed  Meds, vitals, and allergies reviewed.   ROS: See HPI.  Otherwise negative.    Hx of pulm HTN - overall stable  No specialist f/u - due for that/she will set up pulmonary appt   bp is stable today  No cp or palpitations or headaches or edema  No side effects to medicines  BP Readings from Last 3 Encounters:  06/10/16 122/66  06/08/16 120/80  03/02/16 124/68      Wt Readings from Last 3 Encounters:  06/10/16 105 lb 4 oz (47.7 kg)  06/08/16 109 lb (49.4 kg)  03/02/16 108 lb (49 kg)  4 lb down  Eats chicken and fish , and she eats cheese and yogurt , likes nuts /trail mix, black beans  Not getting protein with every meal  Fruit and veg  bmi is 17.5  Tired a lot   Stomach hurts= when she eats - feels like food sits in her stomach  Saw Debbie and set up for colonoscopy  Gassy  occ d/c with her gas Diarrhea comes and goes  Back hurts   Results for orders placed or performed in visit on 06/08/16  POCT urinalysis dipstick  Result Value Ref Range   Color, UA yellow yellow   Clarity, UA clear clear   Glucose, UA negative negative   Bilirubin, UA negative negative  Ketones, POC UA negative negative   Spec Grav, UA >=1.030    Blood, UA negative negative   pH, UA 6.0    Protein Ur, POC trace (A) negative   Urobilinogen, UA negative    Nitrite, UA Negative Negative   Leukocytes, UA Negative Negative    Lab Results  Component Value Date   WBC 5.9 05/25/2016   HGB 15.3 (H) 05/25/2016   HCT 44.3 05/25/2016   MCV 95.2 05/25/2016   PLT 237.0 05/25/2016   used to smoke/ has copd    Chemistry      Component Value Date/Time   NA 138 05/25/2016 1321   K 3.7 05/25/2016 1321   CL 100 05/25/2016 1321   CO2 30 05/25/2016 1321   BUN 14 05/25/2016 1321   CREATININE 0.83 05/25/2016 1321   CREATININE 0.91 05/15/2015 1606      Component Value Date/Time   CALCIUM 9.8 05/25/2016 1321     ALKPHOS 58 05/25/2016 1321   AST 21 05/25/2016 1321   ALT 19 05/25/2016 1321   BILITOT 0.4 05/25/2016 1321     glucose 94  Lab Results  Component Value Date   TSH 1.51 05/25/2016     Cholesterol  Lab Results  Component Value Date   CHOL 197 05/25/2016   CHOL 177 05/15/2015   CHOL 195 03/05/2014   Lab Results  Component Value Date   HDL 64.30 05/25/2016   HDL 70 05/15/2015   HDL 69.80 03/05/2014   Lab Results  Component Value Date   LDLCALC 118 (H) 05/25/2016   LDLCALC 93 05/15/2015   LDLCALC 113 (H) 03/05/2014   Lab Results  Component Value Date   TRIG 76.0 05/25/2016   TRIG 69 05/15/2015   TRIG 63.0 03/05/2014   Lab Results  Component Value Date   CHOLHDL 3 05/25/2016   CHOLHDL 2.5 05/15/2015   CHOLHDL 3 03/05/2014   No results found for: LDLDIRECT  Overall stable with slt inc in LDL    Patient Active Problem List   Diagnosis Date Noted  . Welcome to Medicare preventive visit 06/10/2016  . Estrogen deficiency 06/10/2016  . Constipation 03/02/2016  . Underweight 03/02/2016  . Shortness of breath 05/16/2014  . History of COPD 05/16/2014  . Stress reaction 05/16/2014  . Pedal edema 10/15/2013  . Pulmonary hypertension 10/10/2011  . Encounter for routine gynecological examination 07/11/2011  . Routine general medical examination at a health care facility 07/03/2011  . HEMORRHOIDS, INTERNAL, WITH BLEEDING 03/15/2010  . Essential hypertension 11/16/2006  . Rheumatoid arthritis(714.0) 11/16/2006  . Osteoporosis 11/16/2006   Past Medical History:  Diagnosis Date  . Hypertension   . Osteoporosis   . Pulmonary hypertension   . Rheumatoid arthritis(714.0)    ? scleroderma   Past Surgical History:  Procedure Laterality Date  . colonosc neg  11/08  . Dexa  07/2005   OP hip- 3.0  . LEEP  2005  . Surgery- tendon release in hand  2006  . TUBAL LIGATION     Social History  Substance Use Topics  . Smoking status: Former Research scientist (life sciences)  . Smokeless tobacco:  Never Used     Comment: Quit in 8/07  . Alcohol use No   Family History  Problem Relation Age of Onset  . Heart disease Mother   . Diabetes Mother   . Hypertension Father   . Macular degeneration Father   . Dementia Father     early  . Arthritis Father     RA  .  Alcohol abuse Father   . Breast cancer Neg Hx    No Known Allergies Current Outpatient Prescriptions on File Prior to Visit  Medication Sig Dispense Refill  . Aspirin (ADULT ASPIRIN LOW STRENGTH) 81 MG EC tablet Take 81 mg by mouth daily.      . Biotin 5000 MCG CAPS Take 1 capsule by mouth daily.    . Cholecalciferol (VITAMIN D3) 2000 UNITS capsule Take 2,000 Units by mouth daily.      Marland Kitchen etanercept (ENBREL SURECLICK) 50 MG/ML injection Inject 50 mg into the skin once a week.    . hydroxychloroquine (PLAQUENIL) 200 MG tablet Take 200 mg by mouth daily.    . Multiple Vitamin (MULTIVITAMIN) capsule Take 1 capsule by mouth daily.     No current facility-administered medications on file prior to visit.     Review of Systems    Review of Systems  Constitutional: Negative for fever, appetite change,  and pos for wt loss  Eyes: Negative for pain and visual disturbance.  Respiratory: Negative for cough and pos for mild sob on exertion  Cardiovascular: Negative for cp or palpitations    Gastrointestinal: Negative for nausea,  and constipation. neg for dark stool or blood in stool , pos for abd discomfort and dyspepsia with eating  Genitourinary: Negative for urgency and frequency. neg for dysuria  Skin: Negative for pallor or rash   Neurological: Negative for weakness, light-headedness, numbness and headaches.  Hematological: Negative for adenopathy. Does not bruise/bleed easily.  Psychiatric/Behavioral: Negative for dysphoric mood. The patient is occ nervous/anxious.      Objective:   Physical Exam  Constitutional: She appears well-developed and well-nourished. No distress.  Underweight and well appearing   HENT:    Head: Normocephalic and atraumatic.  Right Ear: External ear normal.  Left Ear: External ear normal.  Mouth/Throat: Oropharynx is clear and moist.  Eyes: Conjunctivae and EOM are normal. Pupils are equal, round, and reactive to light. No scleral icterus.  Neck: Normal range of motion. Neck supple. No JVD present. Carotid bruit is not present. No thyromegaly present.  Cardiovascular: Normal rate, regular rhythm, normal heart sounds and intact distal pulses.  Exam reveals no gallop.   Pulmonary/Chest: Effort normal and breath sounds normal. No respiratory distress. She has no wheezes. She exhibits no tenderness.  Diffusely distant bs No wheezes or crackles   Abdominal: Soft. Bowel sounds are normal. She exhibits no distension, no abdominal bruit and no mass. There is tenderness in the epigastric area. There is no rigidity, no rebound, no guarding, no CVA tenderness, no tenderness at McBurney's point and negative Murphy's sign.  Genitourinary: No breast swelling, tenderness, discharge or bleeding.  Genitourinary Comments: Breast exam: No mass, nodules, thickening, tenderness, bulging, retraction, inflamation, nipple discharge or skin changes noted.  No axillary or clavicular LA.      Musculoskeletal: Normal range of motion. She exhibits no edema or tenderness.  Lymphadenopathy:    She has no cervical adenopathy.  Neurological: She is alert. She has normal reflexes. No cranial nerve deficit. She exhibits normal muscle tone. Coordination normal.  Skin: Skin is warm and dry. No rash noted. No erythema. No pallor.  Marland Kitchenlentigines diffusely  Psychiatric: She has a normal mood and affect.  Very mildly anxious  Pleasant and talkative           Assessment & Plan:   Problem List Items Addressed This Visit      Cardiovascular and Mediastinum   Pulmonary hypertension    Per  pt symptoms are unchanged/ she is interested in getting more exercise  Enc her to f/u with pulmonary -which she has missed,  to check in       Relevant Medications   hydrochlorothiazide (HYDRODIURIL) 25 MG tablet   amLODipine (NORVASC) 5 MG tablet   Essential hypertension    bp in fair control at this time  BP Readings from Last 1 Encounters:  06/10/16 122/66   No changes needed Disc lifstyle change with low sodium diet and exercise  Labs reviewed       Relevant Medications   hydrochlorothiazide (HYDRODIURIL) 25 MG tablet   amLODipine (NORVASC) 5 MG tablet     Musculoskeletal and Integument   Osteoporosis    Due for dexa It was scheduled  Last fx several years ago  No recent falls  On ca and D         Other   Welcome to Medicare preventive visit - Primary    Reviewed health habits including diet and exercise and skin cancer prevention Reviewed appropriate screening tests for age  Also reviewed health mt list, fam hx and immunization status , as well as social and family history   dexa ordered  Pt has a colonoscopy set up  She will schedule her own mammogram  prevnar vaccine today  Declines zoster vaccine  Is currently working on her adv directive formally and will get [place] a copy when done  Disc need for regular meals with protein for her underweight status  UA is clear  EKG is unchanged  Labs reviewed       Relevant Orders   EKG 12-Lead (Completed)   Underweight    She has had GI symptoms lately- colonoscopy with gi w/u is planned  Disc need for regular meals with protien  Enc supplements between meals-even if not hungry  She agrees and will try to do this I suspect some protein calorie malnutrition       Estrogen deficiency    dexa ordered       Relevant Orders   DG Bone Density    Other Visit Diagnoses    Need for prophylactic vaccination against Streptococcus pneumoniae (pneumococcus)       Relevant Medications   pneumococcal 13-valent conjugate vaccine (PREVNAR 13) injection 0.5 mL (Completed)

## 2016-06-10 NOTE — Progress Notes (Signed)
Pre visit review using our clinic review tool, if applicable. No additional management support is needed unless otherwise documented below in the visit note. 

## 2016-06-11 NOTE — Assessment & Plan Note (Signed)
Due for dexa It was scheduled  Last fx several years ago  No recent falls  On ca and D

## 2016-06-11 NOTE — Assessment & Plan Note (Signed)
Per pt symptoms are unchanged/ she is interested in getting more exercise  Enc her to f/u with pulmonary -which she has missed, to check in

## 2016-06-11 NOTE — Assessment & Plan Note (Signed)
bp in fair control at this time  BP Readings from Last 1 Encounters:  06/10/16 122/66   No changes needed Disc lifstyle change with low sodium diet and exercise  Labs reviewed

## 2016-06-11 NOTE — Assessment & Plan Note (Signed)
Reviewed health habits including diet and exercise and skin cancer prevention Reviewed appropriate screening tests for age  Also reviewed health mt list, fam hx and immunization status , as well as social and family history   dexa ordered  Pt has a colonoscopy set up  She will schedule her own mammogram  prevnar vaccine today  Declines zoster vaccine  Is currently working on her adv directive formally and will get [place] a copy when done  Disc need for regular meals with protein for her underweight status  UA is clear  EKG is unchanged  Labs reviewed

## 2016-06-11 NOTE — Assessment & Plan Note (Signed)
She has had GI symptoms lately- colonoscopy with gi w/u is planned  Disc need for regular meals with protien  Enc supplements between meals-even if not hungry  She agrees and will try to do this I suspect some protein calorie malnutrition

## 2016-06-11 NOTE — Assessment & Plan Note (Signed)
dexa ordered

## 2016-06-21 DIAGNOSIS — I7300 Raynaud's syndrome without gangrene: Secondary | ICD-10-CM | POA: Diagnosis not present

## 2016-06-21 DIAGNOSIS — M359 Systemic involvement of connective tissue, unspecified: Secondary | ICD-10-CM | POA: Diagnosis not present

## 2016-06-21 DIAGNOSIS — M0579 Rheumatoid arthritis with rheumatoid factor of multiple sites without organ or systems involvement: Secondary | ICD-10-CM | POA: Diagnosis not present

## 2016-06-21 DIAGNOSIS — M810 Age-related osteoporosis without current pathological fracture: Secondary | ICD-10-CM | POA: Diagnosis not present

## 2016-06-28 ENCOUNTER — Encounter: Payer: Self-pay | Admitting: Gastroenterology

## 2016-06-28 ENCOUNTER — Other Ambulatory Visit: Payer: Self-pay

## 2016-06-28 ENCOUNTER — Ambulatory Visit (INDEPENDENT_AMBULATORY_CARE_PROVIDER_SITE_OTHER): Payer: Commercial Managed Care - HMO | Admitting: Gastroenterology

## 2016-06-28 VITALS — BP 128/75 | HR 85 | Ht 65.0 in | Wt 109.0 lb

## 2016-06-28 DIAGNOSIS — Z791 Long term (current) use of non-steroidal anti-inflammatories (NSAID): Secondary | ICD-10-CM

## 2016-06-28 DIAGNOSIS — K9289 Other specified diseases of the digestive system: Secondary | ICD-10-CM | POA: Diagnosis not present

## 2016-06-28 DIAGNOSIS — M419 Scoliosis, unspecified: Secondary | ICD-10-CM | POA: Insufficient documentation

## 2016-06-28 DIAGNOSIS — K625 Hemorrhage of anus and rectum: Secondary | ICD-10-CM

## 2016-06-28 DIAGNOSIS — R1030 Lower abdominal pain, unspecified: Secondary | ICD-10-CM | POA: Diagnosis not present

## 2016-06-28 MED ORDER — OMEPRAZOLE 40 MG PO CPDR: 40.0000 mg | DELAYED_RELEASE_CAPSULE | Freq: Every day | ORAL | 1 refills | Status: DC

## 2016-06-28 NOTE — Progress Notes (Signed)
Gastroenterology Consultation  Referring Provider:     Abner Greenspan, MD Primary Care Physician:  Loura Pardon, MD Primary Gastroenterologist:  Dr. Jonathon Bellows  Reason for Consultation:     Stomach issues         HPI:   Carrie Hendricks is a 65 y.o. y/o female referred for consultation & management  by Dr. Loura Pardon, MD.  She was seen by his doctors recently and review of office notes suggests she has had complaints of abdominal pain, blood in stools. Last colonoscopy in 05/2007 which was normal , does have a history of internal hemorrhoids. Weight stable since 02/2016. 108 lbs. Labs 06/2016 -CBC,LFT-normal   Abdominal pain: Onset: Began in 2011 - Got worse for a while and says has eased out "somewhat" , Initially she would have pain everytime she would eat, began within 15 minutes, pain would last for about an hour , better after a bowel movement  Site :points to the lower left and right quadrant  Radiation: radiates to the center and to the lower part of the abdomen  Petra Kuba of pain: "like her stomach was full of gas"  Aggravating factors: consuming food Relieving factors :passing gas and having a bowel movement  Weight loss: none   NSAID use: she takes advil 3 a day x many years PPI use :none  Gall bladder surgery: intact  Frequency of bowel movements: for a while had it once a day , before she started miralax - very hard and better after starting miralalx  Change in bowel movements: few months , she has noticed blood in her stool in 2011, recently in august saw blood blood on top of her stool -very bright  Relief with bowel movements: yes Gas/Bloating/Abdominal distension:  Denies any use of artificial sugars directly. No sodas. Consumes ready meals.    Past Medical History:  Diagnosis Date  . Hypertension   . Osteoporosis   . Pulmonary hypertension   . Rheumatoid arthritis(714.0)    ? scleroderma    Past Surgical History:  Procedure Laterality Date  . colonosc neg   11/08  . Dexa  07/2005   OP hip- 3.0  . LEEP  2005  . Surgery- tendon release in hand  2006  . TUBAL LIGATION      Prior to Admission medications   Medication Sig Start Date End Date Taking? Authorizing Provider  alendronate (FOSAMAX) 70 MG tablet Take by mouth. 12/29/15  Yes Historical Provider, MD  amLODipine (NORVASC) 5 MG tablet Take 1 tablet (5 mg total) by mouth daily. 06/10/16  Yes Abner Greenspan, MD  Aspirin (ADULT ASPIRIN LOW STRENGTH) 81 MG EC tablet Take 81 mg by mouth daily.     Yes Historical Provider, MD  Biotin 5000 MCG CAPS Take 1 capsule by mouth daily.   Yes Historical Provider, MD  Cholecalciferol (VITAMIN D3) 2000 UNITS capsule Take 2,000 Units by mouth daily.     Yes Historical Provider, MD  etanercept (ENBREL SURECLICK) 50 MG/ML injection Inject 50 mg into the skin once a week.   Yes Historical Provider, MD  folic acid (FOLVITE) 1 MG tablet Take by mouth. 10/24/14  Yes Historical Provider, MD  hydrochlorothiazide (HYDRODIURIL) 25 MG tablet Take 1 tablet (25 mg total) by mouth daily. 06/10/16  Yes Abner Greenspan, MD  hydroxychloroquine (PLAQUENIL) 200 MG tablet Take 200 mg by mouth daily.   Yes Historical Provider, MD  Multiple Vitamin (MULTIVITAMIN) capsule Take 1 capsule by mouth daily.  Yes Historical Provider, MD  Silica (SILICON DIOXIDE) POWD Take by mouth.   Yes Historical Provider, MD    Family History  Problem Relation Age of Onset  . Heart disease Mother   . Diabetes Mother   . Hypertension Father   . Macular degeneration Father   . Dementia Father     early  . Arthritis Father     RA  . Alcohol abuse Father   . Breast cancer Neg Hx      Social History  Substance Use Topics  . Smoking status: Former Research scientist (life sciences)  . Smokeless tobacco: Never Used     Comment: Quit in 8/07  . Alcohol use No    Allergies as of 06/28/2016  . (No Known Allergies)    Review of Systems:    All systems reviewed and negative except where noted in HPI.   Physical Exam:  BP  128/75   Pulse 85   Ht 5' 5"  (1.651 m)   Wt 109 lb (49.4 kg)   BMI 18.14 kg/m  No LMP recorded. Patient is postmenopausal. Psych:  Alert and cooperative. Normal mood and affect. General:   Alert,  Very  Thin  pleasant and cooperative in NAD Head:  Normocephalic and atraumatic. Eyes:  Sclera clear, no icterus.   Conjunctiva pink. Ears:  Normal auditory acuity. Nose:  No deformity, discharge, or lesions. Mouth:  No deformity or lesions,oropharynx pink & moist. Neck:  Supple; no masses or thyromegaly. Lungs:  Respirations even and unlabored.  Clear throughout to auscultation.   No wheezes, crackles, or rhonchi. No acute distress. Heart:  Regular rate and rhythm; no murmurs, clicks, rubs, or gallops. Abdomen:  Normal bowel sounds.  No bruits.  Soft, non-tender and non-distended without masses, hepatosplenomegaly or hernias noted.  No guarding or rebound tenderness.    Psych:  Alert and cooperative. Normal mood and affect.  Imaging Studies: No results found.  Assessment and Plan:   Carrie Hendricks is a 65 y.o. y/o female has been referred for abdominal pain. Long standing , in addition she has rectal bleeding, gas and bloating, long term NSAID use.    1. Abdominal pain : likely secondary to long term NSAID use and secondary mucosal damage/ulcers. Advised to d/c Advil. Commence on PPI , check H pylori stool antigen and perform EGD. If negative and at next visit still has pain then will get CT abdomen and pelvis.  2. Rectal bleeding: very likely secondary to internal hemorrhoids and constipation . She is almost due for a colonoscopy , will go ahead and perform at the same time.   3. Gas and bloating- may be related to constipation - if persists at next visit will consider empiric treatment for small bowel; bacterial overgrowth syndrome and check for celiac disease. I have advised her to cut down on consuming processed foods as it usually contains a large qty of artificial sugars which can  produce gas from bacterial fermentation.    She has a history of pulmonary hypertension and hence will perform endoscopy after cardiac clearance.   I have discussed alternative options, risks & benefits,  which include, but are not limited to, bleeding, infection, perforation,respiratory complication & drug reaction.  The patient agrees with this plan & written consent will be obtained.    Follow up in 2 months     Dr Jonathon Bellows MD

## 2016-06-29 ENCOUNTER — Other Ambulatory Visit
Admission: RE | Admit: 2016-06-29 | Discharge: 2016-06-29 | Disposition: A | Discharge status: Home / Self Care | Payer: Commercial Managed Care - HMO | Source: Ambulatory Visit | Attending: Gastroenterology | Admitting: Gastroenterology

## 2016-06-29 DIAGNOSIS — R1030 Lower abdominal pain, unspecified: Secondary | ICD-10-CM | POA: Diagnosis not present

## 2016-06-29 DIAGNOSIS — Z791 Long term (current) use of non-steroidal anti-inflammatories (NSAID): Secondary | ICD-10-CM | POA: Insufficient documentation

## 2016-07-01 ENCOUNTER — Encounter
Admission: RE | Disposition: A | Discharge status: Home / Self Care | Payer: Self-pay | Source: Ambulatory Visit | Attending: Gastroenterology

## 2016-07-01 ENCOUNTER — Encounter: Payer: Self-pay | Admitting: Anesthesiology

## 2016-07-01 ENCOUNTER — Telehealth: Payer: Self-pay | Admitting: Gastroenterology

## 2016-07-01 ENCOUNTER — Ambulatory Visit: Payer: Commercial Managed Care - HMO | Admitting: Anesthesiology

## 2016-07-01 ENCOUNTER — Ambulatory Visit
Admission: RE | Admit: 2016-07-01 | Discharge: 2016-07-01 | Disposition: A | Discharge status: Home / Self Care | Payer: Commercial Managed Care - HMO | Source: Ambulatory Visit | Attending: Gastroenterology | Admitting: Gastroenterology

## 2016-07-01 DIAGNOSIS — K2950 Unspecified chronic gastritis without bleeding: Secondary | ICD-10-CM | POA: Insufficient documentation

## 2016-07-01 DIAGNOSIS — I272 Pulmonary hypertension, unspecified: Secondary | ICD-10-CM | POA: Insufficient documentation

## 2016-07-01 DIAGNOSIS — B9681 Helicobacter pylori [H. pylori] as the cause of diseases classified elsewhere: Secondary | ICD-10-CM | POA: Diagnosis not present

## 2016-07-01 DIAGNOSIS — Z5309 Procedure and treatment not carried out because of other contraindication: Secondary | ICD-10-CM | POA: Diagnosis not present

## 2016-07-01 DIAGNOSIS — R109 Unspecified abdominal pain: Secondary | ICD-10-CM

## 2016-07-01 DIAGNOSIS — Z7982 Long term (current) use of aspirin: Secondary | ICD-10-CM | POA: Insufficient documentation

## 2016-07-01 DIAGNOSIS — Z87891 Personal history of nicotine dependence: Secondary | ICD-10-CM | POA: Diagnosis not present

## 2016-07-01 DIAGNOSIS — K2980 Duodenitis without bleeding: Secondary | ICD-10-CM

## 2016-07-01 DIAGNOSIS — K529 Noninfective gastroenteritis and colitis, unspecified: Secondary | ICD-10-CM | POA: Insufficient documentation

## 2016-07-01 DIAGNOSIS — K2970 Gastritis, unspecified, without bleeding: Secondary | ICD-10-CM | POA: Diagnosis not present

## 2016-07-01 DIAGNOSIS — K625 Hemorrhage of anus and rectum: Secondary | ICD-10-CM | POA: Diagnosis not present

## 2016-07-01 DIAGNOSIS — I10 Essential (primary) hypertension: Secondary | ICD-10-CM | POA: Diagnosis not present

## 2016-07-01 DIAGNOSIS — K2900 Acute gastritis without bleeding: Secondary | ICD-10-CM | POA: Diagnosis not present

## 2016-07-01 DIAGNOSIS — M069 Rheumatoid arthritis, unspecified: Secondary | ICD-10-CM | POA: Diagnosis not present

## 2016-07-01 HISTORY — PX: ESOPHAGOGASTRODUODENOSCOPY (EGD) WITH PROPOFOL: SHX5813

## 2016-07-01 HISTORY — PX: FLEXIBLE SIGMOIDOSCOPY: SHX5431

## 2016-07-01 LAB — H. PYLORI ANTIGEN, STOOL: H. PYLORI STOOL AG, EIA: POSITIVE — AB

## 2016-07-01 SURGERY — ESOPHAGOGASTRODUODENOSCOPY (EGD) WITH PROPOFOL
Anesthesia: General

## 2016-07-01 MED ORDER — PHENYLEPHRINE 40 MCG/ML (10ML) SYRINGE FOR IV PUSH (FOR BLOOD PRESSURE SUPPORT)
PREFILLED_SYRINGE | INTRAVENOUS | Status: AC
  Filled 2016-07-01: qty 10

## 2016-07-01 MED ORDER — MESALAMINE 4 G RE ENEM: 4.0000 g | ENEMA | Freq: Every day | RECTAL | 0 refills | Status: DC

## 2016-07-01 MED ORDER — GLYCOPYRROLATE 0.2 MG/ML IJ SOLN
INTRAMUSCULAR | Status: DC | PRN
  Administered 2016-07-01: 0.2 mg via INTRAVENOUS

## 2016-07-01 MED ORDER — LIDOCAINE 2% (20 MG/ML) 5 ML SYRINGE
INTRAMUSCULAR | Status: DC | PRN
  Administered 2016-07-01: 50 mg via INTRAVENOUS

## 2016-07-01 MED ORDER — SODIUM CHLORIDE 0.9 % IV SOLN
INTRAVENOUS | Status: DC
  Administered 2016-07-01: 1000 mL via INTRAVENOUS

## 2016-07-01 MED ORDER — PROPOFOL 10 MG/ML IV BOLUS
INTRAVENOUS | Status: DC | PRN
  Administered 2016-07-01 (×2): 20 mg via INTRAVENOUS
  Administered 2016-07-01: 50 mg via INTRAVENOUS

## 2016-07-01 MED ORDER — FENTANYL CITRATE (PF) 100 MCG/2ML IJ SOLN
INTRAMUSCULAR | Status: DC | PRN
  Administered 2016-07-01: 50 ug via INTRAVENOUS

## 2016-07-01 MED ORDER — LIDOCAINE 2% (20 MG/ML) 5 ML SYRINGE
INTRAMUSCULAR | Status: AC
  Filled 2016-07-01: qty 5

## 2016-07-01 MED ORDER — EPHEDRINE SULFATE 50 MG/ML IJ SOLN
INTRAMUSCULAR | Status: DC | PRN
  Administered 2016-07-01 (×2): 10 mg via INTRAVENOUS

## 2016-07-01 MED ORDER — PROPOFOL 500 MG/50ML IV EMUL
INTRAVENOUS | Status: DC | PRN
  Administered 2016-07-01: 100 ug/kg/min via INTRAVENOUS

## 2016-07-01 MED ORDER — PROPOFOL 500 MG/50ML IV EMUL
INTRAVENOUS | Status: AC
  Filled 2016-07-01: qty 50

## 2016-07-01 MED ORDER — EPHEDRINE 5 MG/ML INJ
INTRAVENOUS | Status: AC
  Filled 2016-07-01: qty 10

## 2016-07-01 MED ORDER — MESALAMINE 1.2 G PO TBEC: 4.8000 g | DELAYED_RELEASE_TABLET | Freq: Every day | ORAL | 1 refills | Status: DC

## 2016-07-01 MED ORDER — MIDAZOLAM HCL 5 MG/5ML IJ SOLN
INTRAMUSCULAR | Status: DC | PRN
  Administered 2016-07-01: 1 mg via INTRAVENOUS

## 2016-07-01 MED ORDER — GLYCOPYRROLATE 0.2 MG/ML IJ SOLN
INTRAMUSCULAR | Status: AC
  Filled 2016-07-01: qty 1

## 2016-07-01 MED ORDER — PHENYLEPHRINE HCL 10 MG/ML IJ SOLN
INTRAMUSCULAR | Status: DC | PRN
Start: 2016-07-01 — End: 2016-07-01
  Administered 2016-07-01 (×4): 80 ug via INTRAVENOUS

## 2016-07-01 NOTE — H&P (Signed)
Jonathon Bellows MD [address] [place], [place] [address] Phone: [phone] Fax : [phone]  Primary Care Physician:  Loura Pardon, MD Primary Gastroenterologist:  Dr. Jonathon Bellows   Pre-Procedure History & Physical: HPI:  Carrie Hendricks is a 65 y.o. female is here for an endoscopy and colonoscopy.   Past Medical History:  Diagnosis Date  . Hypertension   . Osteoporosis   . Pulmonary hypertension   . Rheumatoid arthritis(714.0)    ? scleroderma    Past Surgical History:  Procedure Laterality Date  . colonosc neg  11/08  . Dexa  07/2005   OP hip- 3.0  . LEEP  2005  . Surgery- tendon release in hand  2006  . TUBAL LIGATION      Prior to Admission medications   Medication Sig Start Date End Date Taking? Authorizing Provider  alendronate (FOSAMAX) 70 MG tablet Take by mouth. 12/29/15  Yes Historical Provider, MD  amLODipine (NORVASC) 5 MG tablet Take 1 tablet (5 mg total) by mouth daily. 06/10/16  Yes Abner Greenspan, MD  Aspirin (ADULT ASPIRIN LOW STRENGTH) 81 MG EC tablet Take 81 mg by mouth daily.     Yes Historical Provider, MD  Biotin 5000 MCG CAPS Take 1 capsule by mouth daily.   Yes Historical Provider, MD  Cholecalciferol (VITAMIN D3) 2000 UNITS capsule Take 2,000 Units by mouth daily.     Yes Historical Provider, MD  etanercept (ENBREL SURECLICK) 50 MG/ML injection Inject 50 mg into the skin once a week.   Yes Historical Provider, MD  folic acid (FOLVITE) 1 MG tablet Take by mouth. 10/24/14  Yes Historical Provider, MD  hydrochlorothiazide (HYDRODIURIL) 25 MG tablet Take 1 tablet (25 mg total) by mouth daily. 06/10/16  Yes Abner Greenspan, MD  hydroxychloroquine (PLAQUENIL) 200 MG tablet Take 200 mg by mouth daily.   Yes Historical Provider, MD  Multiple Vitamin (MULTIVITAMIN) capsule Take 1 capsule by mouth daily.   Yes Historical Provider, MD  Silica (SILICON DIOXIDE) POWD Take by mouth.   Yes Historical Provider, MD  omeprazole (PRILOSEC) 40 MG capsule Take 1  capsule (40 mg total) by mouth daily. Patient not taking: Reported on 07/01/2016 06/28/16 08/27/16  Jonathon Bellows, MD    Allergies as of 06/29/2016  . (No Known Allergies)    Family History  Problem Relation Age of Onset  . Heart disease Mother   . Diabetes Mother   . Hypertension Father   . Macular degeneration Father   . Dementia Father     early  . Arthritis Father     RA  . Alcohol abuse Father   . Breast cancer Neg Hx     Social History   Social History  . Marital status: Married    Spouse name: N/A  . Number of children: 3  . Years of education: N/A   Occupational History  .  [employer]   Social History Main Topics  . Smoking status: Former Research scientist (life sciences)  . Smokeless tobacco: Never Used     Comment: Quit in 8/07  . Alcohol use No  . Drug use: No  . Sexual activity: Not on file   Other Topics Concern  . 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     Review of Systems: See HPI, otherwise negative ROS  Physical Exam: BP 134/88   Pulse 92   Temp (!) 96.5 F (35.8  C) (Tympanic)   Resp 16   Ht 5' 5"  (1.651 m)   Wt 104 lb (47.2 kg)   SpO2 98%   BMI 17.31 kg/m  General:   Alert,  pleasant and cooperative in NAD Head:  Normocephalic and atraumatic. Neck:  Supple; no masses or thyromegaly. Lungs:  Clear throughout to auscultation.    Heart:  Regular rate and rhythm. Abdomen:  Soft, nontender and nondistended. Normal bowel sounds, without guarding, and without rebound.   Neurologic:  Alert and  oriented x4;  grossly normal neurologically.  Impression/Plan: Carrie Hendricks is here for an endoscopy and colonoscopy to be performed for rectal bleeding and abdominal pain   Risks, benefits, limitations, and alternatives regarding  endoscopy and colonoscopy have been reviewed with the patient.  Questions have been answered.  All parties agreeable.   Jonathon Bellows, MD  07/01/2016, 9:26 AM

## 2016-07-01 NOTE — Telephone Encounter (Signed)
Patients daughter called looking for a refill on Rowasa and Lialda 1.2g [facility]

## 2016-07-01 NOTE — Anesthesia Postprocedure Evaluation (Signed)
Anesthesia Post Note  Patient: MONAI HINDES  Procedure(s) Performed: Procedure(s) (LRB): ESOPHAGOGASTRODUODENOSCOPY (EGD) WITH PROPOFOL (N/A) FLEXIBLE SIGMOIDOSCOPY (N/A)  Patient location during evaluation: Endoscopy Anesthesia Type: General Level of consciousness: awake and alert Pain management: pain level controlled Vital Signs Assessment: post-procedure vital signs reviewed and stable Respiratory status: spontaneous breathing, nonlabored ventilation, respiratory function stable and patient connected to nasal cannula oxygen Cardiovascular status: blood pressure returned to baseline and stable Postop Assessment: no signs of nausea or vomiting Anesthetic complications: no     Last Vitals:  Vitals:   07/01/16 1020 07/01/16 1048  BP:  107/68  Pulse: 92 85  Resp: (!) 23 (!) 22  Temp:      Last Pain:  Vitals:   07/01/16 1019  TempSrc: Tympanic                 Danel Studzinski S

## 2016-07-01 NOTE — Op Note (Signed)
[facility] Gastroenterology Patient Name: Carrie Hendricks Procedure Date: 07/01/2016 9:48 AM MRN: [id number] Account #: [id number] Date of Birth: Nov 28, 1950 Admit Type: Outpatient Age: 65 Room: [facility] ENDO ROOM 4 Gender: Female Note Status: Finalized Procedure:            Flexible Sigmoidoscopy Indications:          Rectal hemorrhage Providers:            Jonathon Bellows MD, MD Referring MD:         Wynelle Fanny. Tower (Referring MD) Medicines:            Monitored Anesthesia Care Complications:        No immediate complications. Procedure:            Pre-Anesthesia Assessment:                       - Prior to the procedure, a History and Physical was                        performed, and patient medications, allergies and                        sensitivities were reviewed. The patient's tolerance of                        previous anesthesia was reviewed.                       - The risks and benefits of the procedure and the                        sedation options and risks were discussed with the                        patient. All questions were answered and informed                        consent was obtained.                       - The risks and benefits of the procedure and the                        sedation options and risks were discussed with the                        patient. All questions were answered and informed                        consent was obtained.                       - The risks and benefits of the procedure and the                        sedation options and risks were discussed with the                        patient. All questions were answered and informed  consent was obtained.                       - ASA Grade Assessment: II - A patient with mild                        systemic disease.                       After obtaining informed consent, the scope was passed                        under direct vision. The  Colonoscope was introduced                        through the anus with the intention of advancing to the                        sigmoid colon. The scope was advanced to the sigmoid                        colon before the procedure was aborted. Medications                        were given. After obtaining informed consent, the scope                        was passed under direct vision.The flexible                        sigmoidoscopy was aborted due to extensive colitis. The                        flexible sigmoidoscopy was accomplished with ease. The                        patient tolerated the procedure well. The quality of                        the bowel preparation was good. Findings:      The perianal and digital rectal examinations were normal.      Very severe inflammation seen from 30 cm mark till the anal verge , loss       of vascularity, edema, bleeding on contact , mucus and a few ulcerations       seen in the rectum. Biopsies taken. Did not advance beyond 30 cm mark       due to severe inflammation , Biopsies were taken with a cold forceps for       histology. Impression:           - The procedure was aborted due to extensive colitis. Recommendation:       - Discharge patient to home (with escort).                       - Patient has a contact number available for                        emergencies. The signs and symptoms of potential  delayed complications were discussed with the patient.                        Return to normal activities tomorrow. Written discharge                        instructions were provided to the patient.                       - Resume previous diet today.                       - Use Lialda 1.2 gm at 4 tabs PO daily daily.                       - Use Rowasa enemas 1 per rectum daily for 4 weeks.                       - No aspirin, ibuprofen, naproxen, or other                        non-steroidal anti-inflammatory drugs for  2 weeks. Procedure Code(s):    --- Professional ---                       [phone], 81, Sigmoidoscopy, flexible; with biopsy, single                        or multiple Diagnosis Code(s):    --- Professional ---                       Z53.8, Procedure and treatment not carried out for                        other reasons                       K62.5, Hemorrhage of anus and rectum CPT copyright 2016 American Medical Association. All rights reserved. The codes documented in this report are preliminary and upon coder review may  be revised to meet current compliance requirements. Jonathon Bellows, MD Jonathon Bellows MD, MD 07/01/2016 10:18:38 AM This report has been signed electronically. Number of Addenda: 0 Note Initiated On: 07/01/2016 9:48 AM Total Procedure Duration: 0 hours 0 minutes 49 seconds       [facility]

## 2016-07-01 NOTE — Telephone Encounter (Signed)
See below and advise

## 2016-07-01 NOTE — Op Note (Signed)
[facility] Gastroenterology Patient Name: Carrie Hendricks Procedure Date: 07/01/2016 9:49 AM MRN: [id number] Account #: [id number] Date of Birth: 08-07-1950 Admit Type: Outpatient Age: 65 Room: [facility] ENDO ROOM 4 Gender: Female Note Status: Finalized Procedure:            Upper GI endoscopy Indications:          Abdominal pain Providers:            Jonathon Bellows MD, MD Referring MD:         Wynelle Fanny. Tower (Referring MD) Medicines:            Monitored Anesthesia Care Complications:        No immediate complications. Procedure:            Pre-Anesthesia Assessment:                       - Prior to the procedure, a History and Physical was                        performed, and patient medications, allergies and                        sensitivities were reviewed. The patient's tolerance of                        previous anesthesia was reviewed.                       - The risks and benefits of the procedure and the                        sedation options and risks were discussed with the                        patient. All questions were answered and informed                        consent was obtained.                       - The risks and benefits of the procedure and the                        sedation options and risks were discussed with the                        patient. All questions were answered and informed                        consent was obtained.                       - ASA Grade Assessment: II - A patient with mild                        systemic disease.                       After obtaining informed consent, the endoscope was  passed under direct vision. Throughout the procedure,                        the patient's blood pressure, pulse, and oxygen                        saturations were monitored continuously. The Endoscope                        was introduced through the mouth, and advanced to the   third part of duodenum. The upper GI endoscopy was                        accomplished with ease. The patient tolerated the                        procedure well. Findings:      Localized mild inflammation characterized by congestion (edema) and       erythema was found in the duodenal bulb. Biopsies were taken with a cold       forceps for histology.      Diffuse moderate inflammation characterized by congestion (edema),       erosions, erythema and granularity was found in the entire examined       stomach. Biopsies were taken with a cold forceps for histology.      One tongue of salmon-colored mucosa was present from 38 to 39 cm. No       other visible abnormalities were present. The maximum longitudinal       extent of these esophageal mucosal changes was 1 cm in length. Biopsies       were taken with a cold forceps for histology. Impression:           - Duodenitis. Biopsied.                       - Gastritis. Biopsied.                       - Salmon-colored mucosa suspicious for short-segment                        Barrett's esophagus. Biopsied. Recommendation:       - Patient has a contact number available for                        emergencies. The signs and symptoms of potential                        delayed complications were discussed with the patient.                        Return to normal activities tomorrow. Written discharge                        instructions were provided to the patient.                       - Resume previous diet.                       - Continue present medications.                       -  Await pathology results.                       - Discharge patient to home (with escort).                       - Return to my office as previously scheduled. Procedure Code(s):    --- Professional ---                       [phone], Esophagogastroduodenoscopy, flexible, transoral;                        with biopsy, single or multiple Diagnosis Code(s):    ---  Professional ---                       K29.80, Duodenitis without bleeding                       K29.70, Gastritis, unspecified, without bleeding                       K22.8, Other specified diseases of esophagus                       R10.9, Unspecified abdominal pain CPT copyright 2016 American Medical Association. All rights reserved. The codes documented in this report are preliminary and upon coder review may  be revised to meet current compliance requirements. Jonathon Bellows, MD Jonathon Bellows MD, MD 07/01/2016 10:05:54 AM This report has been signed electronically. Number of Addenda: 0 Note Initiated On: 07/01/2016 9:49 AM      [facility]

## 2016-07-01 NOTE — Transfer of Care (Signed)
Immediate Anesthesia Transfer of Care Note  Patient: Carrie Hendricks  Procedure(s) Performed: Procedure(s): COLONOSCOPY WITH PROPOFOL (N/A) ESOPHAGOGASTRODUODENOSCOPY (EGD) WITH PROPOFOL (N/A)  Patient Location: Endoscopy Unit  Anesthesia Type:General  Level of Consciousness: awake and alert   Airway & Oxygen Therapy: Patient Spontanous Breathing and Patient connected to nasal cannula oxygen  Post-op Assessment: Report given to RN and Post -op Vital signs reviewed and stable  Post vital signs: Reviewed  Last Vitals:  Vitals:   07/01/16 1010 07/01/16 1017  BP:  99/61  Pulse:  90  Resp:  16  Temp: (!) 35.8 C (!) 35.8 C    Last Pain:  Vitals:   07/01/16 1010  TempSrc: Tympanic         Complications: No apparent anesthesia complications

## 2016-07-01 NOTE — Telephone Encounter (Signed)
You sent pt's rx's to her [facility] pharmacy. She stated it would take a few days to get to her. She is wanting to know if she will be okay until then. She isn't having any problems. If you want her to start right away I will resend to the local pharmacy. Please advise.

## 2016-07-01 NOTE — Telephone Encounter (Signed)
The medication that Dr. Vicente Males prescribed is almost 400.00. It's Mesalamine. Is there something else she can have that's not as expensive? Please call

## 2016-07-01 NOTE — Anesthesia Preprocedure Evaluation (Signed)
Anesthesia Evaluation  Patient identified by MRN, date of birth, ID band Patient awake    Reviewed: Allergy & Precautions, NPO status , Patient's Chart, lab work & pertinent test results, reviewed documented beta blocker date and time   Airway Mallampati: II  TM Distance: >3 FB     Dental  (+) Chipped   Pulmonary shortness of breath, former smoker,           Cardiovascular hypertension, Pt. on medications      Neuro/Psych PSYCHIATRIC DISORDERS    GI/Hepatic   Endo/Other    Renal/GU      Musculoskeletal   Abdominal   Peds  Hematology   Anesthesia Other Findings   Reproductive/Obstetrics                             Anesthesia Physical Anesthesia Plan  ASA: II  Anesthesia Plan: General   Post-op Pain Management:    Induction: Intravenous  Airway Management Planned: Nasal Cannula  Additional Equipment:   Intra-op Plan:   Post-operative Plan:   Informed Consent: I have reviewed the patients History and Physical, chart, labs and discussed the procedure including the risks, benefits and alternatives for the proposed anesthesia with the patient or authorized representative who has indicated his/her understanding and acceptance.     Plan Discussed with: CRNA  Anesthesia Plan Comments:         Anesthesia Quick Evaluation

## 2016-07-05 ENCOUNTER — Telehealth: Payer: Self-pay

## 2016-07-05 ENCOUNTER — Encounter: Payer: Self-pay | Admitting: Gastroenterology

## 2016-07-05 LAB — SURGICAL PATHOLOGY

## 2016-07-05 NOTE — Telephone Encounter (Signed)
Carrie Hendricks  Ask her if she is having any bleeding after stopping Advil, if she is feeling better, lets see if all her symptoms get better off Advil. Schedule follow up in 1 week. If symptoms are still present off Advil will start treatment. It is very likely she has colitis due to Advil use.   Hold off mesalamine for now and let me know how she is doing

## 2016-07-05 NOTE — Telephone Encounter (Signed)
Patient had an EGD on Friday 07/01/2016. She is calling because she has questions pertaining to her medications and costs. Please contact the patient and advice.

## 2016-07-05 NOTE — Telephone Encounter (Signed)
Spoke with pt this morning and she stated she is improving. No rectal bleeding this morning after bowel movement. Pt has scheduled a follow up appt on 07/11/16.

## 2016-07-05 NOTE — Telephone Encounter (Signed)
See other message regarding medication.

## 2016-07-08 ENCOUNTER — Telehealth: Payer: Self-pay

## 2016-07-08 ENCOUNTER — Other Ambulatory Visit: Payer: Self-pay

## 2016-07-08 MED ORDER — CLARITHROMYCIN 500 MG PO TABS: 500.0000 mg | ORAL_TABLET | Freq: Two times a day (BID) | ORAL | 0 refills | Status: DC

## 2016-07-08 MED ORDER — AMOXICILLIN 500 MG PO CAPS: 1000.0000 mg | ORAL_CAPSULE | Freq: Two times a day (BID) | ORAL | 0 refills | Status: DC

## 2016-07-08 NOTE — Telephone Encounter (Signed)
Pt notified of results and rx that were sent to [facility]. Pt aware we are repeating test in 6 weeks.

## 2016-07-08 NOTE — Telephone Encounter (Signed)
-----   Message from Jonathon Bellows, MD sent at 07/05/2016  9:29 AM EST ----- H pylori positive- needs omeprazole 42m once daily , amoxicillin 1 gram BID and clarithromycin 500 mg BID for 14 days, recheck H pylori stool antigen in 6 weeks . Check for penicillin allergergy

## 2016-07-11 ENCOUNTER — Encounter: Payer: Self-pay | Admitting: Gastroenterology

## 2016-07-11 ENCOUNTER — Ambulatory Visit (INDEPENDENT_AMBULATORY_CARE_PROVIDER_SITE_OTHER): Payer: Medicare HMO | Admitting: Gastroenterology

## 2016-07-11 ENCOUNTER — Telehealth: Payer: Self-pay

## 2016-07-11 VITALS — BP 116/74 | HR 82 | Wt 108.0 lb

## 2016-07-11 DIAGNOSIS — Z791 Long term (current) use of non-steroidal anti-inflammatories (NSAID): Secondary | ICD-10-CM

## 2016-07-11 DIAGNOSIS — B9681 Helicobacter pylori [H. pylori] as the cause of diseases classified elsewhere: Secondary | ICD-10-CM

## 2016-07-11 DIAGNOSIS — K2970 Gastritis, unspecified, without bleeding: Secondary | ICD-10-CM

## 2016-07-11 DIAGNOSIS — K5289 Other specified noninfective gastroenteritis and colitis: Secondary | ICD-10-CM

## 2016-07-11 NOTE — Telephone Encounter (Signed)
Dr. Vicente Males forgot to give the patient her biopsy results. Please call the patient and advice.

## 2016-07-11 NOTE — Progress Notes (Signed)
Primary Care Physician: Carrie Pardon, MD  Primary Gastroenterologist:  Dr. Jonathon Hendricks   Chief Complaint  Patient presents with  . Follow-up    Rectal Bleeding    [name] is a 66 y.o. female .She is here for a follow up to her initial visit on 06/28/16 when she was seen for abdominal pain , blood in her stools.    The abdominal pain is since 2011, blood in her stools since 2011.At her initial visit she was on large doses of long term Advil.   Interval history 06/2016-07/2016  EGD 06/2016: biopsies showed active chronic gastritis with H pylori , marked duodenitis , normal esophageal biopsies  Colonoscopy 06/2016 : unable to perform colonoscopy as she had severe colitis on the left side of the colon - we had to abort the procedure at the 30 cm mark.Biopsies confirmed  Active colitis with chronicity   Says her bloating is better,much better with the abdominal pain, says pain has resolved after stopping NSAID's. She has just started treatment for antibiotics .   No better with her bowel movements- better -not as much rectal bleeding . She has a bowel movement once daily , no blood with every bowel movement . Peviously was seeing blood with every bowel movement . She stopped Advil a week back .   Current Outpatient Prescriptions  Medication Sig Dispense Refill  . alendronate (FOSAMAX) 70 MG tablet Take by mouth.    Marland Kitchen amLODipine (NORVASC) 5 MG tablet Take 1 tablet (5 mg total) by mouth daily. 90 tablet 3  . amoxicillin (AMOXIL) 500 MG capsule Take 2 capsules (1,000 mg total) by mouth 2 (two) times daily. 56 capsule 0  . Aspirin (ADULT ASPIRIN LOW STRENGTH) 81 MG EC tablet Take 81 mg by mouth daily.      . Biotin 5000 MCG CAPS Take 1 capsule by mouth daily.    . Cholecalciferol (VITAMIN D3) 2000 UNITS capsule Take 2,000 Units by mouth daily.      . clarithromycin (BIAXIN) 500 MG tablet Take 1 tablet (500 mg total) by mouth 2 (two) times daily. 28 tablet 0  . etanercept  (ENBREL SURECLICK) 50 MG/ML injection Inject 50 mg into the skin once a week.    . hydrochlorothiazide (HYDRODIURIL) 25 MG tablet Take 1 tablet (25 mg total) by mouth daily. 90 tablet 3  . hydroxychloroquine (PLAQUENIL) 200 MG tablet Take 200 mg by mouth daily.    . Multiple Vitamin (MULTIVITAMIN) capsule Take 1 capsule by mouth daily.    Marland Kitchen omeprazole (PRILOSEC) 40 MG capsule Take 1 capsule (40 mg total) by mouth daily. 30 capsule 1   No current facility-administered medications for this visit.     Allergies as of 07/11/2016  . (No Known Allergies)    ROS:  General: Negative for anorexia, weight loss, fever, chills, fatigue, weakness. ENT: Negative for hoarseness, difficulty swallowing , nasal congestion. CV: Negative for chest pain, angina, palpitations, dyspnea on exertion, peripheral edema.  Respiratory: Negative for dyspnea at rest, dyspnea on exertion, cough, sputum, wheezing.  GI: See history of present illness. GU:  Negative for dysuria, hematuria, urinary incontinence, urinary frequency, nocturnal urination.  Endo: Negative for unusual weight change.    Physical Examination:   BP 116/74   Pulse 82   Wt 108 lb (49 kg)   BMI 17.97 kg/m   General: Well-nourished, well-developed in no acute distress.  Eyes: No icterus. Conjunctivae pink. Mouth: Oropharyngeal mucosa moist and pink , no lesions erythema  or exudate. Lungs: Clear to auscultation bilaterally. Non-labored. Heart: Regular rate and rhythm, no murmurs rubs or gallops.  Abdomen: Bowel sounds are normal, nontender, nondistended, no hepatosplenomegaly or masses, no abdominal bruits or hernia , no rebound or guarding.   Extremities: No lower extremity edema. No clubbing or deformities. Neuro: Alert and oriented x 3.  Grossly intact. Skin: Warm and dry, no jaundice.   Psych: Alert and cooperative, normal mood and affect.   Imaging Studies: No results found.  Assessment and Plan:   Carrie Hendricks is a 66 y.o.  y/o female  Here for follow up for abdominal pain. Long standing , in addition she has rectal bleeding, gas and bloating, long term NSAID use. She has gastritis, duodentitis on endoscopy and has been treated for a H pylori infection    1. H pylori gastritis and duodenitis:Presently all pain has resolved. She has just commenced treatment for H pylori with antibiotics. I will plan to check for eradication after next visit.   2. Long term NSAID use: she has stopped   3. Severe colitis: Differentials are NSAID induced colitis vs ulcerative colitis. It is very likely possible she has a background of ulcerative colitis. She is very reluctant to try medications and hence she chose to wait and see if all her symptoms go away after d/c NSAID's. She will come back to see me in 6 weeks. She is aware that if she is any worse she will call my office asap and we would need to start treatment. She will need a full colonoscopy once her colitis has resolved,     F/u in 6 weeks  Dr Carrie Bellows  MD

## 2016-07-11 NOTE — Telephone Encounter (Signed)
Dr. Vicente Males, please advise. I see you mentioned her results in your office note.

## 2016-07-12 NOTE — Telephone Encounter (Signed)
I did explain to her about the results during her visit. She had inflammation inher stomach and colon from nsaids and she is going to stay off it for now and see me back in 6 weeks. Continue abx for H pylori

## 2016-07-12 NOTE — Telephone Encounter (Signed)
Pt notified biopsy results were discussed during her ov yesterday. Reviewed these results with pt again.

## 2016-07-13 ENCOUNTER — Other Ambulatory Visit: Payer: Self-pay | Admitting: Family Medicine

## 2016-07-13 DIAGNOSIS — Z1231 Encounter for screening mammogram for malignant neoplasm of breast: Secondary | ICD-10-CM

## 2016-07-14 ENCOUNTER — Telehealth: Payer: Self-pay

## 2016-07-14 NOTE — Telephone Encounter (Signed)
Thanks for the [facility] and keep me updated

## 2016-07-14 NOTE — Telephone Encounter (Signed)
Pt left /vm had colonoscopy and endoscopy on 07/01/16 by Dr Vicente Males and pt was started on 2 abx and 07/13/16 pt legs,ankles and feet started swelling badly. Still swollen today. I called pt back and she said Dr Georgeann Oppenheim nurse called pt back and pt was advised to hold abx until nurse can contact Dr Vicente Males and call pt back; pt is OK to wait on Dr Georgeann Oppenheim nurse cb. Pt wanted Dr [name] to be aware of what is going on. FYI to Dr [name].

## 2016-07-14 NOTE — Telephone Encounter (Signed)
Spoke with pt regarding the swelling, aching of her feet, ankles and calfs started yesterday. She said this is something new and was wondering if this could be coming from the antibiotics we have her on for the h pylori. She stated she has already stopped them. Please advise.

## 2016-07-14 NOTE — Telephone Encounter (Signed)
Patients legs (calf), ankle, and feet are swelling really bad as of yesterday. Patient is wondering if the 2 strong antibiotics she has been on for the past week could be causing this. Antibiotics were given to her by Dr. Vicente Males. She has been drinking plenty of water and staying really hydrated. Please call patient and advice

## 2016-07-15 NOTE — Telephone Encounter (Signed)
Unlikely a side effect of the antibiotics. How many days of antibiotics has she taken ?Marland Kitchen May need to repeat the course once she feels a bit better in a few days, enquire about penicillin allergy.

## 2016-07-15 NOTE — Telephone Encounter (Signed)
Patient called to let Ginger know that she will persevere and will continue to take the antibiotic as directed.

## 2016-07-15 NOTE — Telephone Encounter (Signed)
Pt stated she had only taken the course for 1 week. She doesn't have an allergy to PCN. She wants to know if she can take half dose and be on it a little longer. I told her there is a reason for the dosage schedule. Please advise.

## 2016-07-18 ENCOUNTER — Encounter: Payer: Self-pay | Admitting: Family Medicine

## 2016-07-18 DIAGNOSIS — M810 Age-related osteoporosis without current pathological fracture: Secondary | ICD-10-CM | POA: Diagnosis not present

## 2016-07-22 ENCOUNTER — Telehealth: Payer: Self-pay

## 2016-07-22 NOTE — Telephone Encounter (Signed)
Pt advised we will recheck her stool in 6 weeks for h pylori.

## 2016-07-22 NOTE — Telephone Encounter (Signed)
Carrie Hendricks has finished all of her antibiotics as of today and wants to know what her next step. Please call patient and advice

## 2016-07-28 ENCOUNTER — Encounter: Payer: Self-pay | Admitting: Gastroenterology

## 2016-08-05 NOTE — Telephone Encounter (Signed)
error 

## 2016-08-15 ENCOUNTER — Ambulatory Visit (INDEPENDENT_AMBULATORY_CARE_PROVIDER_SITE_OTHER): Payer: Medicare HMO | Admitting: Gastroenterology

## 2016-08-15 VITALS — BP 118/74 | HR 75 | Ht 65.0 in | Wt 106.0 lb

## 2016-08-15 DIAGNOSIS — K5150 Left sided colitis without complications: Secondary | ICD-10-CM | POA: Diagnosis not present

## 2016-08-15 DIAGNOSIS — A048 Other specified bacterial intestinal infections: Secondary | ICD-10-CM | POA: Diagnosis not present

## 2016-08-15 NOTE — Progress Notes (Signed)
Primary Care Physician: Loura Pardon, MD  Primary Gastroenterologist:  Dr. Jonathon Bellows   Chief Complaint  Patient presents with  . Follow-up    [name] is a 66 y.o. female .  She is here today for follow up. She was last seen on 07/11/16 for abdominal pain and blood in her stool(colitis IBD vs NSAID induced), long term NSAID use   Summary of history :  The abdominal pain is since 2011, blood in her stools since 2011.At her initial visit she was on large doses of long term Advil. EGD 06/2016: biopsies showed active chronic gastritis with H pylori , marked duodenitis , normal esophageal biopsies  Colonoscopy 06/2016 : unable to perform colonoscopy as she had severe colitis on the left side of the colon - we had to abort the procedure at the 30 cm mark.Biopsies confirmed  Active colitis with chronicity    Interval history 07/2016-08/2016   She says 100 % better, normal bowel movements, no abdominal pains, Stopped all her Advil.  Completed course of antibiotics for H pylori. She has 1 bowel movements a day and sometimes 2   Current Outpatient Prescriptions  Medication Sig Dispense Refill  . alendronate (FOSAMAX) 70 MG tablet Take by mouth.    Marland Kitchen amLODipine (NORVASC) 5 MG tablet Take 1 tablet (5 mg total) by mouth daily. 90 tablet 3  . Aspirin (ADULT ASPIRIN LOW STRENGTH) 81 MG EC tablet Take 81 mg by mouth daily.      . Biotin 5000 MCG CAPS Take 1 capsule by mouth daily.    . Cholecalciferol (VITAMIN D3) 2000 UNITS capsule Take 2,000 Units by mouth daily.      Marland Kitchen etanercept (ENBREL SURECLICK) 50 MG/ML injection Inject 50 mg into the skin once a week.    . hydrochlorothiazide (HYDRODIURIL) 25 MG tablet Take 1 tablet (25 mg total) by mouth daily. 90 tablet 3  . hydroxychloroquine (PLAQUENIL) 200 MG tablet Take 200 mg by mouth daily.    . Multiple Vitamin (MULTIVITAMIN) capsule Take 1 capsule by mouth daily.    Marland Kitchen omeprazole (PRILOSEC) 40 MG capsule Take 1 capsule (40 mg  total) by mouth daily. 30 capsule 1   No current facility-administered medications for this visit.     Allergies as of 08/15/2016  . (No Known Allergies)    ROS:  General: Negative for anorexia, weight loss, fever, chills, fatigue, weakness. ENT: Negative for hoarseness, difficulty swallowing , nasal congestion. CV: Negative for chest pain, angina, palpitations, dyspnea on exertion, peripheral edema.  Respiratory: Negative for dyspnea at rest, dyspnea on exertion, cough, sputum, wheezing.  GI: See history of present illness. GU:  Negative for dysuria, hematuria, urinary incontinence, urinary frequency, nocturnal urination.  Endo: Negative for unusual weight change.    Physical Examination:   BP 118/74   Pulse 75   Ht 5' 5"  (1.651 m)   Wt 106 lb (48.1 kg)   BMI 17.64 kg/m   General: Well-nourished, well-developed in no acute distress.  Eyes: No icterus. Conjunctivae pink. Mouth: Oropharyngeal mucosa moist and pink , no lesions erythema or exudate. Lungs: Clear to auscultation bilaterally. Non-labored. Heart: Regular rate and rhythm, no murmurs rubs or gallops.  Abdomen: Bowel sounds are normal, nontender, nondistended, no hepatosplenomegaly or masses, no abdominal bruits or hernia , no rebound or guarding.   Extremities: No lower extremity edema. No clubbing or deformities. Neuro: Alert and oriented x 3.  Grossly intact. Skin: Warm and dry, no jaundice.   Psych: Alert  and cooperative, normal mood and affect.  Imaging Studies: No results found.  Assessment and Plan:  Carrie Hendricks is a 66 y.o. y/o female  Here for follow up for H pylori gastritis and colitis/.    1. H pylori gastritis and duodenitis:Presently all pain has resolved. Check stool H pylori antigen to confirm eradication   2. Long term NSAID use: she has stopped it and has no symptoms   3. Severe colitis: Differentials are NSAID induced colitis vs ulcerative colitis. All symptoms resolved after D/c  Advil I will repeat colonosocpy in 6-8 weeks with biopsies to determine if she has any colitis   Dr Jonathon Bellows  MD F/u in 8 weeks

## 2016-08-16 ENCOUNTER — Other Ambulatory Visit: Payer: Self-pay

## 2016-08-16 ENCOUNTER — Other Ambulatory Visit
Admission: RE | Admit: 2016-08-16 | Discharge: 2016-08-16 | Disposition: A | Discharge status: Home / Self Care | Payer: Commercial Managed Care - HMO | Source: Ambulatory Visit | Attending: Gastroenterology | Admitting: Gastroenterology

## 2016-08-16 DIAGNOSIS — A048 Other specified bacterial intestinal infections: Secondary | ICD-10-CM | POA: Diagnosis not present

## 2016-08-18 ENCOUNTER — Telehealth: Payer: Self-pay

## 2016-08-18 LAB — H. PYLORI ANTIGEN, STOOL: H. Pylori Stool Ag, Eia: NEGATIVE

## 2016-08-18 NOTE — Telephone Encounter (Signed)
-----   Message from Jonathon Bellows, MD sent at 08/18/2016 11:22 AM EST ----- H pylori is negative confirming eradication

## 2016-08-18 NOTE — Telephone Encounter (Signed)
Pt notified lab test was negative.

## 2016-08-22 ENCOUNTER — Ambulatory Visit
Admission: RE | Admit: 2016-08-22 | Discharge: 2016-08-22 | Disposition: A | Discharge status: Home / Self Care | Payer: Commercial Managed Care - HMO | Source: Ambulatory Visit | Attending: Family Medicine | Admitting: Family Medicine

## 2016-08-22 DIAGNOSIS — Z1231 Encounter for screening mammogram for malignant neoplasm of breast: Secondary | ICD-10-CM | POA: Diagnosis not present

## 2016-09-22 DIAGNOSIS — M0579 Rheumatoid arthritis with rheumatoid factor of multiple sites without organ or systems involvement: Secondary | ICD-10-CM | POA: Diagnosis not present

## 2016-09-22 DIAGNOSIS — I7300 Raynaud's syndrome without gangrene: Secondary | ICD-10-CM | POA: Diagnosis not present

## 2016-09-29 ENCOUNTER — Encounter: Payer: Self-pay | Admitting: *Deleted

## 2016-09-30 ENCOUNTER — Ambulatory Visit
Admission: RE | Admit: 2016-09-30 | Discharge: 2016-09-30 | Disposition: A | Discharge status: Home / Self Care | Payer: Medicare HMO | Source: Ambulatory Visit | Attending: Gastroenterology | Admitting: Gastroenterology

## 2016-09-30 ENCOUNTER — Encounter: Payer: Self-pay | Admitting: *Deleted

## 2016-09-30 ENCOUNTER — Ambulatory Visit: Payer: Medicare HMO | Admitting: Anesthesiology

## 2016-09-30 ENCOUNTER — Encounter
Admission: RE | Disposition: A | Discharge status: Home / Self Care | Payer: Self-pay | Source: Ambulatory Visit | Attending: Gastroenterology

## 2016-09-30 DIAGNOSIS — M069 Rheumatoid arthritis, unspecified: Secondary | ICD-10-CM | POA: Insufficient documentation

## 2016-09-30 DIAGNOSIS — K5100 Ulcerative (chronic) pancolitis without complications: Secondary | ICD-10-CM | POA: Diagnosis not present

## 2016-09-30 DIAGNOSIS — Z87891 Personal history of nicotine dependence: Secondary | ICD-10-CM | POA: Insufficient documentation

## 2016-09-30 DIAGNOSIS — Z79899 Other long term (current) drug therapy: Secondary | ICD-10-CM | POA: Diagnosis not present

## 2016-09-30 DIAGNOSIS — I10 Essential (primary) hypertension: Secondary | ICD-10-CM | POA: Diagnosis not present

## 2016-09-30 DIAGNOSIS — K640 First degree hemorrhoids: Secondary | ICD-10-CM | POA: Insufficient documentation

## 2016-09-30 DIAGNOSIS — M810 Age-related osteoporosis without current pathological fracture: Secondary | ICD-10-CM | POA: Insufficient documentation

## 2016-09-30 DIAGNOSIS — K5150 Left sided colitis without complications: Secondary | ICD-10-CM | POA: Diagnosis not present

## 2016-09-30 DIAGNOSIS — Z7982 Long term (current) use of aspirin: Secondary | ICD-10-CM | POA: Diagnosis not present

## 2016-09-30 DIAGNOSIS — F419 Anxiety disorder, unspecified: Secondary | ICD-10-CM | POA: Diagnosis not present

## 2016-09-30 DIAGNOSIS — I272 Pulmonary hypertension, unspecified: Secondary | ICD-10-CM | POA: Diagnosis not present

## 2016-09-30 HISTORY — DX: Unspecified osteoarthritis, unspecified site: M19.90

## 2016-09-30 HISTORY — PX: COLONOSCOPY WITH PROPOFOL: SHX5780

## 2016-09-30 SURGERY — COLONOSCOPY WITH PROPOFOL
Anesthesia: General

## 2016-09-30 MED ORDER — MIDAZOLAM HCL 2 MG/2ML IJ SOLN
INTRAMUSCULAR | Status: DC | PRN
  Administered 2016-09-30: 2 mg via INTRAVENOUS

## 2016-09-30 MED ORDER — PROPOFOL 500 MG/50ML IV EMUL
INTRAVENOUS | Status: AC
Start: 2016-09-30 — End: 2016-09-30
  Filled 2016-09-30: qty 50

## 2016-09-30 MED ORDER — EPHEDRINE SULFATE 50 MG/ML IJ SOLN
INTRAMUSCULAR | Status: DC | PRN
  Administered 2016-09-30 (×4): 5 mg via INTRAVENOUS

## 2016-09-30 MED ORDER — EPHEDRINE SULFATE 50 MG/ML IJ SOLN
INTRAMUSCULAR | Status: AC
Start: 2016-09-30 — End: 2016-09-30
  Filled 2016-09-30: qty 1

## 2016-09-30 MED ORDER — FENTANYL CITRATE (PF) 100 MCG/2ML IJ SOLN
INTRAMUSCULAR | Status: DC | PRN
  Administered 2016-09-30 (×2): 50 ug via INTRAVENOUS

## 2016-09-30 MED ORDER — MIDAZOLAM HCL 2 MG/2ML IJ SOLN
INTRAMUSCULAR | Status: AC
  Filled 2016-09-30: qty 2

## 2016-09-30 MED ORDER — SODIUM CHLORIDE 0.9 % IV SOLN
INTRAVENOUS | Status: DC
  Administered 2016-09-30: 09:00:00 via INTRAVENOUS

## 2016-09-30 MED ORDER — PROPOFOL 500 MG/50ML IV EMUL
INTRAVENOUS | Status: DC | PRN
  Administered 2016-09-30: 120 ug/kg/min via INTRAVENOUS

## 2016-09-30 MED ORDER — FENTANYL CITRATE (PF) 100 MCG/2ML IJ SOLN
INTRAMUSCULAR | Status: AC
  Filled 2016-09-30: qty 2

## 2016-09-30 NOTE — Anesthesia Preprocedure Evaluation (Signed)
Anesthesia Evaluation  Patient identified by MRN, date of birth, ID band Patient awake    Reviewed: Allergy & Precautions, NPO status , Patient's Chart, lab work & pertinent test results, reviewed documented beta blocker date and time   Airway Mallampati: II  TM Distance: >3 FB     Dental  (+) Chipped   Pulmonary shortness of breath, former smoker,           Cardiovascular hypertension, Pt. on medications      Neuro/Psych Anxiety    GI/Hepatic   Endo/Other    Renal/GU      Musculoskeletal  (+) Arthritis ,   Abdominal   Peds  Hematology   Anesthesia Other Findings   Reproductive/Obstetrics                             Anesthesia Physical Anesthesia Plan  ASA: III  Anesthesia Plan: General   Post-op Pain Management:    Induction: Intravenous  Airway Management Planned: Oral ETT  Additional Equipment:   Intra-op Plan:   Post-operative Plan:   Informed Consent: I have reviewed the patients History and Physical, chart, labs and discussed the procedure including the risks, benefits and alternatives for the proposed anesthesia with the patient or authorized representative who has indicated his/her understanding and acceptance.     Plan Discussed with: CRNA  Anesthesia Plan Comments:         Anesthesia Quick Evaluation

## 2016-09-30 NOTE — Anesthesia Procedure Notes (Signed)
Performed by: Vaughan Sine Pre-anesthesia Checklist: Patient identified, Emergency Drugs available, Suction available, Patient being monitored and Timeout performed Patient Re-evaluated:Patient Re-evaluated prior to inductionOxygen Delivery Method: Nasal cannula Preoxygenation: Pre-oxygenation with 100% oxygen Intubation Type: IV induction Placement Confirmation: breath sounds checked- equal and bilateral and CO2 detector

## 2016-09-30 NOTE — Transfer of Care (Signed)
Immediate Anesthesia Transfer of Care Note  Patient: Carrie Hendricks  Procedure(s) Performed: Procedure(s): COLONOSCOPY WITH PROPOFOL (N/A)  Patient Location: PACU  Anesthesia Type:General  Level of Consciousness: awake and sedated  Airway & Oxygen Therapy: Patient Spontanous Breathing and Patient connected to nasal cannula oxygen  Post-op Assessment: Report given to RN and Post -op Vital signs reviewed and stable  Post vital signs: Reviewed and stable  Last Vitals:  Vitals:   09/30/16 0907  BP: 111/78  Pulse: 95  Resp: 20  Temp: 36.3 C    Last Pain:  Vitals:   09/30/16 0907  TempSrc: Tympanic         Complications: No apparent anesthesia complications

## 2016-09-30 NOTE — Op Note (Signed)
[facility] Patient Name: Carrie Hendricks Procedure Date: 09/30/2016 9:58 AM MRN: [id number] Account #: [id number] Date of Birth: 1951/06/29 Admit Type: Ambulatory Age: 66 Room: [facility] ENDO ROOM 4 Gender: Female Note Status: Finalized Procedure:            Colonoscopy Indications:          Follow-up of chronic ulcerative pancolitis Providers:            Jonathon Bellows MD, MD Medicines:            Monitored Anesthesia Care Complications:        No immediate complications. Procedure:            Pre-Anesthesia Assessment:                       - Prior to the procedure, a History and Physical was                        performed, and patient medications, allergies and                        sensitivities were reviewed. The patient's tolerance of                        previous anesthesia was reviewed.                       - The risks and benefits of the procedure and the                        sedation options and risks were discussed with the                        patient. All questions were answered and informed                        consent was obtained.                       - ASA Grade Assessment: III - A patient with severe                        systemic disease.                       After obtaining informed consent, the colonoscope was                        passed under direct vision. Throughout the procedure,                        the patient's blood pressure, pulse, and oxygen                        saturations were monitored continuously. The                        Colonoscope was introduced through the anus and                        advanced to the the terminal ileum. The colonoscopy was  performed with ease. The patient tolerated the                        procedure well. The quality of the bowel preparation                        was good. Findings:      The perianal and digital rectal examinations were normal.      Diffuse moderate inflammation characterized by altered vascularity,       congestion (edema) and erythema was found in the descending colon, in       the transverse colon, in the ascending colon and in the cecum. Biopsies       were taken with a cold forceps for histology.      Patchy mild inflammation characterized by congestion (edema), erythema       and loss of vascularity was found in the rectum, in the sigmoid colon       and in the descending colon. Biopsies were taken with a cold forceps for       histology.      Diffuse mild inflammation characterized by congestion (edema), erythema       and loss of vascularity was found in the rectum. Biopsies were taken       with a cold forceps for histology.      Non-bleeding internal hemorrhoids were found during retroflexion. The       hemorrhoids were small and Grade I (internal hemorrhoids that do not       prolapse).      The terminal ileum appeared normal. Biopsies were taken with a cold       forceps for histology.      The exam was otherwise without abnormality on direct and retroflexion       views. Impression:           - Diffuse moderate inflammation was found in the                        descending colon, in the transverse colon, in the                        ascending colon and in the cecum secondary to                        ulcerative colitis. Biopsied.                       - Patchy mild inflammation was found in the rectum, in                        the sigmoid colon and in the descending colon secondary                        to ulcerative colitis. Biopsied.                       - Diffuse mild inflammation was found in the rectum                        secondary to ulcerative colitis. Biopsied.                       -  Non-bleeding internal hemorrhoids.                       - The examined portion of the ileum was normal.                        Biopsied.                       - The examination was otherwise normal on  direct and                        retroflexion views. Recommendation:       - Discharge patient to home (with escort).                       - Resume previous diet.                       - Continue present medications.                       - Await pathology results.                       - Return to my office in 2 weeks. Procedure Code(s):    --- Professional ---                       [id number], Colonoscopy, flexible; with biopsy, single or                        multiple Diagnosis Code(s):    --- Professional ---                       K64.0, First degree hemorrhoids                       K51.00, Ulcerative (chronic) pancolitis without                        complications CPT copyright 2016 [employer]. All rights reserved. The codes documented in this report are preliminary and upon coder review may  be revised to meet current compliance requirements. Jonathon Bellows, MD Jonathon Bellows MD, MD 09/30/2016 10:31:31 AM This report has been signed electronically. Number of Addenda: 0 Note Initiated On: 09/30/2016 9:58 AM Scope Withdrawal Time: 0 hours 13 minutes 6 seconds  Total Procedure Duration: 0 hours 23 minutes 10 seconds       [facility]

## 2016-09-30 NOTE — Anesthesia Post-op Follow-up Note (Cosign Needed)
Anesthesia QCDR form completed.        

## 2016-09-30 NOTE — H&P (Signed)
Carrie Bellows MD [address], [place], [place] [address] Phone: [phone] Fax : [phone]  Primary Care Physician:  Loura Pardon, MD Primary Gastroenterologist:  Dr. Jonathon Hendricks   Pre-Procedure History & Physical: HPI:  Carrie Hendricks is a 66 y.o. female is here for an colonoscopy.   Past Medical History:  Diagnosis Date  . Arthritis    rheumatoid  . Hypertension   . Osteoporosis   . Pulmonary hypertension   . Rheumatoid arthritis(714.0)    ? scleroderma    Past Surgical History:  Procedure Laterality Date  . colonosc neg  11/08  . Dexa  07/2005   OP hip- 3.0  . ESOPHAGOGASTRODUODENOSCOPY (EGD) WITH PROPOFOL N/A 07/01/2016   Procedure: ESOPHAGOGASTRODUODENOSCOPY (EGD) WITH PROPOFOL;  Surgeon: Carrie Bellows, MD;  Location: [facility] ENDOSCOPY;  Service: Endoscopy;  Laterality: N/A;  . FLEXIBLE SIGMOIDOSCOPY N/A 07/01/2016   Procedure: FLEXIBLE SIGMOIDOSCOPY;  Surgeon: Carrie Bellows, MD;  Location: [facility] ENDOSCOPY;  Service: Endoscopy;  Laterality: N/A;  . LEEP  2005  . Surgery- tendon release in hand  2006  . TUBAL LIGATION      Prior to Admission medications   Medication Sig Start Date End Date Taking? Authorizing Provider  alendronate (FOSAMAX) 70 MG tablet Take by mouth. 12/29/15  Yes Historical Provider, MD  amLODipine (NORVASC) 5 MG tablet Take 1 tablet (5 mg total) by mouth daily. 06/10/16  Yes Abner Greenspan, MD  Aspirin (ADULT ASPIRIN LOW STRENGTH) 81 MG EC tablet Take 81 mg by mouth daily.     Yes Historical Provider, MD  Biotin 5000 MCG CAPS Take 1 capsule by mouth daily.   Yes Historical Provider, MD  Cholecalciferol (VITAMIN D3) 2000 UNITS capsule Take 2,000 Units by mouth daily.     Yes Historical Provider, MD  etanercept (ENBREL SURECLICK) 50 MG/ML injection Inject 50 mg into the skin once a week.   Yes Historical Provider, MD  hydrochlorothiazide (HYDRODIURIL) 25 MG tablet Take 1 tablet (25 mg total) by mouth daily. 06/10/16  Yes Abner Greenspan, MD  Multiple  Vitamin (MULTIVITAMIN) capsule Take 1 capsule by mouth daily.   Yes Historical Provider, MD  hydroxychloroquine (PLAQUENIL) 200 MG tablet Take 200 mg by mouth daily.    Historical Provider, MD  omeprazole (PRILOSEC) 40 MG capsule Take 1 capsule (40 mg total) by mouth daily. 06/28/16 08/27/16  Carrie Bellows, MD    Allergies as of 08/16/2016  . (No Known Allergies)    Family History  Problem Relation Age of Onset  . Heart disease Mother   . Diabetes Mother   . Hypertension Father   . Macular degeneration Father   . Dementia Father     early  . Arthritis Father     RA  . Alcohol abuse Father   . Breast cancer Neg Hx     Social History   Social History  . Marital status: Married    Spouse name: N/A  . Number of children: 3  . Years of education: N/A   Occupational History  .  [employer]   Social History Main Topics  . Smoking status: Former Research scientist (life sciences)  . Smokeless tobacco: Never Used     Comment: Quit in 8/07  . Alcohol use No  . Drug use: No  . Sexual activity: Not on file   Other Topics Concern  . 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  Review of Systems: See HPI, otherwise negative ROS  Physical Exam: BP 111/78   Pulse 95   Temp 97.3 F (36.3 C) (Tympanic)   Resp 20   Ht 5' 6"  (1.676 m)   Wt 101 lb (45.8 kg)   SpO2 100%   BMI 16.30 kg/m  General:   Alert,  pleasant and cooperative in NAD Head:  Normocephalic and atraumatic. Neck:  Supple; no masses or thyromegaly. Lungs:  Clear throughout to auscultation.    Heart:  Regular rate and rhythm. Abdomen:  Soft, nontender and nondistended. Normal bowel sounds, without guarding, and without rebound.   Neurologic:  Alert and  oriented x4;  grossly normal neurologically.  Impression/Plan: Carrie Hendricks is here for an colonoscopy to be performed for evaluation of colitis   Risks, benefits, limitations, and alternatives regarding  colonoscopy  have been reviewed with the patient.  Questions have been answered.  All parties agreeable.   Carrie Bellows, MD  09/30/2016, 9:35 AM

## 2016-09-30 NOTE — Anesthesia Postprocedure Evaluation (Signed)
Anesthesia Post Note  Patient: Carrie Hendricks  Procedure(s) Performed: Procedure(s) (LRB): COLONOSCOPY WITH PROPOFOL (N/A)  Patient location during evaluation: Endoscopy Anesthesia Type: General Level of consciousness: awake and alert Pain management: pain level controlled Vital Signs Assessment: post-procedure vital signs reviewed and stable Respiratory status: spontaneous breathing, nonlabored ventilation, respiratory function stable and patient connected to nasal cannula oxygen Cardiovascular status: blood pressure returned to baseline and stable Postop Assessment: no signs of nausea or vomiting Anesthetic complications: no     Last Vitals:  Vitals:   09/30/16 1051 09/30/16 1101  BP: 108/69 111/80  Pulse: (!) 102 93  Resp: (!) 28 (!) 30  Temp:      Last Pain:  Vitals:   09/30/16 1031  TempSrc: Tympanic                 Duayne Brideau S

## 2016-10-03 ENCOUNTER — Other Ambulatory Visit: Payer: Self-pay

## 2016-10-03 ENCOUNTER — Telehealth: Payer: Self-pay | Admitting: Gastroenterology

## 2016-10-03 ENCOUNTER — Encounter: Payer: Self-pay | Admitting: Gastroenterology

## 2016-10-03 DIAGNOSIS — K5190 Ulcerative colitis, unspecified, without complications: Secondary | ICD-10-CM

## 2016-10-03 LAB — SURGICAL PATHOLOGY

## 2016-10-03 NOTE — Telephone Encounter (Signed)
Per Vickie in Endo, patient needs a two week f/u. Lvm for patient to call us to schedule an appointment.

## 2016-10-03 NOTE — Telephone Encounter (Signed)
LVM for patient callback.   Advise  Pt of labs being requested prior to appointment.

## 2016-10-05 ENCOUNTER — Other Ambulatory Visit: Payer: Self-pay

## 2016-10-05 ENCOUNTER — Other Ambulatory Visit
Admission: RE | Admit: 2016-10-05 | Discharge: 2016-10-05 | Disposition: A | Discharge status: Home / Self Care | Payer: Medicare HMO | Source: Ambulatory Visit | Attending: Gastroenterology | Admitting: Gastroenterology

## 2016-10-05 DIAGNOSIS — K5190 Ulcerative colitis, unspecified, without complications: Secondary | ICD-10-CM | POA: Diagnosis not present

## 2016-10-05 LAB — GASTROINTESTINAL PANEL BY PCR, STOOL (REPLACES STOOL CULTURE)
Adenovirus F40/41: NOT DETECTED
Astrovirus: NOT DETECTED
Campylobacter species: NOT DETECTED
Cryptosporidium: NOT DETECTED
Cyclospora cayetanensis: NOT DETECTED
ENTEROAGGREGATIVE E COLI (EAEC): NOT DETECTED
ENTEROTOXIGENIC E COLI (ETEC): NOT DETECTED
Entamoeba histolytica: NOT DETECTED
Enteropathogenic E coli (EPEC): NOT DETECTED
GIARDIA LAMBLIA: NOT DETECTED
NOROVIRUS GI/GII: NOT DETECTED
Plesimonas shigelloides: NOT DETECTED
ROTAVIRUS A: NOT DETECTED
SALMONELLA SPECIES: NOT DETECTED
SAPOVIRUS (I, II, IV, AND V): NOT DETECTED
SHIGA LIKE TOXIN PRODUCING E COLI (STEC): NOT DETECTED
SHIGELLA/ENTEROINVASIVE E COLI (EIEC): NOT DETECTED
Vibrio cholerae: NOT DETECTED
Vibrio species: NOT DETECTED
Yersinia enterocolitica: NOT DETECTED

## 2016-10-06 ENCOUNTER — Telehealth: Payer: Self-pay

## 2016-10-06 ENCOUNTER — Other Ambulatory Visit: Payer: Self-pay

## 2016-10-06 ENCOUNTER — Telehealth: Payer: Self-pay | Admitting: Gastroenterology

## 2016-10-06 DIAGNOSIS — K219 Gastro-esophageal reflux disease without esophagitis: Secondary | ICD-10-CM

## 2016-10-06 MED ORDER — OMEPRAZOLE 40 MG PO CPDR: 40.0000 mg | DELAYED_RELEASE_CAPSULE | Freq: Every day | ORAL | 1 refills | Status: DC

## 2016-10-06 NOTE — Telephone Encounter (Signed)
-----   Message from Jonathon Bellows, MD sent at 10/06/2016  7:18 AM EDT ----- Stool tests negative for infection

## 2016-10-06 NOTE — Telephone Encounter (Signed)
LVM for patient callback for results.  Stool tests negative per Dr. Vicente Males.

## 2016-10-06 NOTE — Telephone Encounter (Signed)
Patient left a voice message returning your call regarding results.

## 2016-10-24 ENCOUNTER — Ambulatory Visit (INDEPENDENT_AMBULATORY_CARE_PROVIDER_SITE_OTHER): Payer: Medicare HMO | Admitting: Gastroenterology

## 2016-10-24 ENCOUNTER — Other Ambulatory Visit: Payer: Self-pay

## 2016-10-24 ENCOUNTER — Other Ambulatory Visit
Admission: RE | Admit: 2016-10-24 | Discharge: 2016-10-24 | Disposition: A | Discharge status: Home / Self Care | Payer: Medicare HMO | Source: Ambulatory Visit | Attending: Gastroenterology | Admitting: Gastroenterology

## 2016-10-24 ENCOUNTER — Encounter: Payer: Self-pay | Admitting: Gastroenterology

## 2016-10-24 VITALS — BP 117/73 | HR 66 | Temp 97.6°F | Resp 16 | Ht 66.0 in | Wt 109.8 lb

## 2016-10-24 DIAGNOSIS — B9681 Helicobacter pylori [H. pylori] as the cause of diseases classified elsewhere: Secondary | ICD-10-CM

## 2016-10-24 DIAGNOSIS — K2980 Duodenitis without bleeding: Secondary | ICD-10-CM

## 2016-10-24 DIAGNOSIS — K2970 Gastritis, unspecified, without bleeding: Secondary | ICD-10-CM | POA: Insufficient documentation

## 2016-10-24 DIAGNOSIS — K5100 Ulcerative (chronic) pancolitis without complications: Secondary | ICD-10-CM

## 2016-10-24 LAB — HEPATIC FUNCTION PANEL
ALBUMIN: 4.3 g/dL (ref 3.5–5.0)
ALK PHOS: 52 U/L (ref 38–126)
ALT: 21 U/L (ref 14–54)
AST: 24 U/L (ref 15–41)
BILIRUBIN INDIRECT: 0.9 mg/dL (ref 0.3–0.9)
Bilirubin, Direct: 0.2 mg/dL (ref 0.1–0.5)
TOTAL PROTEIN: 7.7 g/dL (ref 6.5–8.1)
Total Bilirubin: 1.1 mg/dL (ref 0.3–1.2)

## 2016-10-24 LAB — C-REACTIVE PROTEIN

## 2016-10-24 LAB — CBC WITH DIFFERENTIAL/PLATELET
BASOS ABS: 0.1 10*3/uL (ref 0–0.1)
BASOS PCT: 1 %
EOS ABS: 0.1 10*3/uL (ref 0–0.7)
EOS PCT: 2 %
HCT: 43.4 % (ref 35.0–47.0)
HEMOGLOBIN: 14.9 g/dL (ref 12.0–16.0)
Lymphocytes Relative: 32 %
Lymphs Abs: 1.5 10*3/uL (ref 1.0–3.6)
MCH: 33 pg (ref 26.0–34.0)
MCHC: 34.3 g/dL (ref 32.0–36.0)
MCV: 96.1 fL (ref 80.0–100.0)
MONO ABS: 0.3 10*3/uL (ref 0.2–0.9)
Monocytes Relative: 7 %
Neutro Abs: 2.6 10*3/uL (ref 1.4–6.5)
Neutrophils Relative %: 58 %
PLATELETS: 189 10*3/uL (ref 150–440)
RBC: 4.52 MIL/uL (ref 3.80–5.20)
RDW: 13.3 % (ref 11.5–14.5)
WBC: 4.6 10*3/uL (ref 3.6–11.0)

## 2016-10-24 LAB — BASIC METABOLIC PANEL
Anion gap: 7 (ref 5–15)
BUN: 14 mg/dL (ref 6–20)
CO2: 31 mmol/L (ref 22–32)
Calcium: 9.6 mg/dL (ref 8.9–10.3)
Chloride: 100 mmol/L — ABNORMAL LOW (ref 101–111)
Creatinine, Ser: 0.75 mg/dL (ref 0.44–1.00)
Glucose, Bld: 96 mg/dL (ref 65–99)
POTASSIUM: 3.4 mmol/L — AB (ref 3.5–5.1)
SODIUM: 138 mmol/L (ref 135–145)

## 2016-10-24 LAB — PROTIME-INR
INR: 1.11
Prothrombin Time: 14.4 seconds (ref 11.4–15.2)

## 2016-10-24 MED ORDER — MESALAMINE 800 MG PO TBEC: 1600.0000 mg | DELAYED_RELEASE_TABLET | Freq: Three times a day (TID) | ORAL | 0 refills | Status: DC

## 2016-10-24 NOTE — Progress Notes (Signed)
Primary Care Physician: Loura Pardon, MD  Primary Gastroenterologist:  Dr. Jonathon Bellows   Chief Complaint  Patient presents with  . Follow-up  . Bloated    bloating has improved since resuming  omeprazole    [name] is a 66 y.o. female  She is here today for a follow up . She was last seen on 08/15/16 for abdominal pain and blood in her stool(colitis IBD vs NSAID induced), long term NSAID use   Summary of history :  She has had abdominal pain and  blood in her stools since 2011.At her initial visit she was on large doses of long term Advil. EGD 06/2016: biopsies showed active chronic gastritis with H pylori , marked duodenitis , normal esophageal biopsies  Colonoscopy 06/2016 : unable to perform colonoscopy as she had severe colitis on the left side of the colon - we had to abort the procedure at the 30 cm mark.Biopsies confirmed Active colitis with chronicity    Interval history 08/2016 -10/2016   H pylori stool antigen is negative 08/16/16  Colonoscopy was performed on 3/3/0/18 and bx of the terminal ileum was normal but biopsies of the ascending colon , transverse , descending, sigmoid and rectum showed some areas of inflammation and areas with chronic inflammation changes.   She says for a year and a half she had abdominal pains, passing "pus and blood in bowel movements"  Today she says she is "much better" , has a bowel movement daily , mostly one a day , no rectal bleeding. Omeprazole helps her abdominal bloating.  She is not on any medications for her colitis presently. Not on any NSAID's.   Current Outpatient Prescriptions  Medication Sig Dispense Refill  . alendronate (FOSAMAX) 70 MG tablet Take by mouth.    Marland Kitchen amLODipine (NORVASC) 5 MG tablet Take 1 tablet (5 mg total) by mouth daily. 90 tablet 3  . Aspirin (ADULT ASPIRIN LOW STRENGTH) 81 MG EC tablet Take 81 mg by mouth daily.      . Cholecalciferol (VITAMIN D3) 2000 UNITS capsule Take 2,000 Units by  mouth daily.      Marland Kitchen etanercept (ENBREL SURECLICK) 50 MG/ML injection Inject 50 mg into the skin once a week.    Marland Kitchen amoxicillin (AMOXIL) 500 MG capsule     . Biotin 5000 MCG CAPS Take 1 capsule by mouth daily.    . clarithromycin (BIAXIN) 500 MG tablet     . folic acid (FOLVITE) 1 MG tablet Take by mouth.    . hydrochlorothiazide (HYDRODIURIL) 25 MG tablet Take 1 tablet (25 mg total) by mouth daily. (Patient not taking: Reported on 10/24/2016) 90 tablet 3  . hydroxychloroquine (PLAQUENIL) 200 MG tablet Take 200 mg by mouth daily.    . Multiple Vitamin (MULTIVITAMIN) capsule Take 1 capsule by mouth daily.    Marland Kitchen omeprazole (PRILOSEC) 40 MG capsule Take 1 capsule (40 mg total) by mouth daily. (Patient not taking: Reported on 10/24/2016) 30 capsule 1   No current facility-administered medications for this visit.     Allergies as of 10/24/2016  . (No Known Allergies)    ROS:  General: Negative for anorexia, weight loss, fever, chills, fatigue, weakness. ENT: Negative for hoarseness, difficulty swallowing , nasal congestion. CV: Negative for chest pain, angina, palpitations, dyspnea on exertion, peripheral edema.  Respiratory: Negative for dyspnea at rest, dyspnea on exertion, cough, sputum, wheezing.  GI: See history of present illness. GU:  Negative for dysuria, hematuria, urinary incontinence, urinary frequency,  nocturnal urination.  Endo: Negative for unusual weight change.    Physical Examination:   BP 117/73   Pulse 66   Temp 97.6 F (36.4 C) (Oral)   Resp 16   Ht 5' 6"  (1.676 m)   Wt 109 lb 12.8 oz (49.8 kg)   BMI 17.72 kg/m   General: Well-nourished, well-developed in no acute distress.  Eyes: No icterus. Conjunctivae pink. Mouth: Oropharyngeal mucosa moist and pink , no lesions erythema or exudate. Lungs: Clear to auscultation bilaterally. Non-labored. Heart: Regular rate and rhythm, no murmurs rubs or gallops.  Abdomen: Bowel sounds are normal, nontender, nondistended, no  hepatosplenomegaly or masses, no abdominal bruits or hernia , no rebound or guarding.   Extremities: No lower extremity edema. No clubbing or deformities. Neuro: Alert and oriented x 3.  Grossly intact. Skin: Warm and dry, no jaundice.   Psych: Alert and cooperative, normal mood and affect.    Imaging Studies: No results found.  Assessment and Plan:    Carrie Hendricks a 66 y.o.y/o femaleHere for follow up [place] pylori gastritis and colitis   1. H pylori gastritis and duodenitis: s/p H pylori stool antigen treated and eradication confirmed   2. Severe colitis:On initial sigmoidoscopy she had severe colitis which improved significantly when I performed her colonoscopy after 3 months the inflammation had significantly reduced but there as some still seen with inflammation which was confirmed on biopsies. It is very likely she has ulcerative colitis and her history too suggests she has had symptoms for years but played them down. Presently no clinical symptoms but will treat her with ASA compound once we know what her insurance covers. I will check baseline labs and address health maintenance at next visit. Provided patient information on ulcerative colitis.   Dr Jonathon Bellows  MD Follow up in 12 weeks

## 2016-10-25 ENCOUNTER — Other Ambulatory Visit
Admission: RE | Admit: 2016-10-25 | Discharge: 2016-10-25 | Disposition: A | Discharge status: Home / Self Care | Payer: Medicare HMO | Source: Ambulatory Visit | Attending: Gastroenterology | Admitting: Gastroenterology

## 2016-10-25 DIAGNOSIS — K5100 Ulcerative (chronic) pancolitis without complications: Secondary | ICD-10-CM | POA: Diagnosis not present

## 2016-10-25 LAB — VITAMIN D 25 HYDROXY (VIT D DEFICIENCY, FRACTURES): VIT D 25 HYDROXY: 76.8 ng/mL (ref 30.0–100.0)

## 2016-10-25 LAB — HEPATITIS A ANTIBODY, IGM: Hep A IgM: NEGATIVE

## 2016-10-25 LAB — HIV ANTIBODY (ROUTINE TESTING W REFLEX): HIV SCREEN 4TH GENERATION: NONREACTIVE

## 2016-10-25 LAB — IGG, IGA, IGM
IgA: 250 mg/dL (ref 87–352)
IgG (Immunoglobin G), Serum: 1454 mg/dL (ref 700–1600)
IgM, Serum: 64 mg/dL (ref 26–217)

## 2016-10-25 LAB — CERULOPLASMIN: Ceruloplasmin: 21.6 mg/dL (ref 19.0–39.0)

## 2016-10-25 LAB — HEPATITIS B SURFACE ANTIBODY, QUANTITATIVE: Hepatitis B-Post: <3.1 m[IU]/mL — ABNORMAL LOW (ref >9.9)

## 2016-10-26 ENCOUNTER — Telehealth: Payer: Self-pay

## 2016-10-26 LAB — RETICULIN ANTIBODIES, IGA W TITER: Reticulin Ab, IgA: NEGATIVE titer (ref <2.5)

## 2016-10-26 NOTE — Telephone Encounter (Signed)
Advised pt of results per Dr. Vicente Males.  Pt requested that Dr. [name] be made aware of Hep A/B requirement.

## 2016-10-26 NOTE — Telephone Encounter (Signed)
-----   Message from Jonathon Bellows, MD sent at 10/26/2016 10:04 AM EDT ----- Regarding: RE: Change Medication Can we find out with insurance if they can pay for any other "mesalamine" compound rather than azulfidine     Such as aprisio,Asacol,Pentasa,  Do we have any samples? ----- Message ----- From: Leontine Locket, CMA Sent: 10/24/2016   4:23 PM To: Jonathon Bellows, MD Subject: Change Medication                              Cost of  Asacol (mesalamine) medication was too high.   Insurance will pay 100% for azulfidine 90-day Rx.  Please advise.

## 2016-10-27 ENCOUNTER — Telehealth: Payer: Self-pay | Admitting: *Deleted

## 2016-10-27 NOTE — Telephone Encounter (Signed)
-----   Message from Abner Greenspan, MD sent at 10/27/2016 11:45 AM EDT ----- She was diag for IBD and needs to start hep A and hep B imms  Please call her to schedule a nurse visit for imms Thanks

## 2016-10-27 NOTE — Telephone Encounter (Signed)
Left voicemail requesting pt to call the office back and schedule a nurse visit to get Hep A/B vaccine scheduled

## 2016-10-28 LAB — MISC LABCORP TEST (SEND OUT): LABCORP TEST CODE: 123255

## 2016-10-28 LAB — ALPHA-1-ANTITRYPSIN DEFICIENCY

## 2016-10-28 NOTE — Telephone Encounter (Signed)
Left a message on vm per dpr

## 2016-10-28 NOTE — Telephone Encounter (Signed)
Pt scheduled nurse visit for Hep A and B vaccine on 11/03/16 at 4 pm. Pt wants Dr Marliss Coots opinion on why she needs the Hep A & B vaccine. Pt wants to know what changed medically with pts health that caused her to need immunizations. Pt said can email pt on mychart or call at her contact #.

## 2016-10-28 NOTE — Telephone Encounter (Signed)
I was contacted by her GI about it-it is a requirement to be immunized with any sort of inflammatory bowel disease (such as colitis) They called [place] to tell you to get immunized

## 2016-11-03 ENCOUNTER — Ambulatory Visit (INDEPENDENT_AMBULATORY_CARE_PROVIDER_SITE_OTHER): Payer: Medicare HMO | Admitting: *Deleted

## 2016-11-03 DIAGNOSIS — Z23 Encounter for immunization: Secondary | ICD-10-CM | POA: Diagnosis not present

## 2016-11-15 ENCOUNTER — Encounter: Payer: Self-pay | Admitting: Gastroenterology

## 2016-11-16 ENCOUNTER — Other Ambulatory Visit: Payer: Self-pay

## 2016-11-16 DIAGNOSIS — K5100 Ulcerative (chronic) pancolitis without complications: Secondary | ICD-10-CM

## 2016-11-16 MED ORDER — MESALAMINE ER 0.375 G PO CP24: 375.0000 mg | ORAL_CAPSULE | Freq: Every day | ORAL | 3 refills | Status: DC

## 2016-12-06 ENCOUNTER — Ambulatory Visit (INDEPENDENT_AMBULATORY_CARE_PROVIDER_SITE_OTHER): Payer: Medicare HMO | Admitting: *Deleted

## 2016-12-06 DIAGNOSIS — Z23 Encounter for immunization: Secondary | ICD-10-CM | POA: Diagnosis not present

## 2016-12-19 DIAGNOSIS — I7300 Raynaud's syndrome without gangrene: Secondary | ICD-10-CM | POA: Diagnosis not present

## 2016-12-19 DIAGNOSIS — M0579 Rheumatoid arthritis with rheumatoid factor of multiple sites without organ or systems involvement: Secondary | ICD-10-CM | POA: Diagnosis not present

## 2016-12-27 DIAGNOSIS — M359 Systemic involvement of connective tissue, unspecified: Secondary | ICD-10-CM | POA: Diagnosis not present

## 2016-12-27 DIAGNOSIS — I7300 Raynaud's syndrome without gangrene: Secondary | ICD-10-CM | POA: Diagnosis not present

## 2016-12-27 DIAGNOSIS — K51919 Ulcerative colitis, unspecified with unspecified complications: Secondary | ICD-10-CM | POA: Insufficient documentation

## 2016-12-27 DIAGNOSIS — G8929 Other chronic pain: Secondary | ICD-10-CM | POA: Diagnosis not present

## 2016-12-27 DIAGNOSIS — M545 Low back pain: Secondary | ICD-10-CM | POA: Diagnosis not present

## 2016-12-27 DIAGNOSIS — M0579 Rheumatoid arthritis with rheumatoid factor of multiple sites without organ or systems involvement: Secondary | ICD-10-CM | POA: Diagnosis not present

## 2016-12-27 DIAGNOSIS — M810 Age-related osteoporosis without current pathological fracture: Secondary | ICD-10-CM | POA: Diagnosis not present

## 2017-01-20 ENCOUNTER — Encounter: Payer: Self-pay | Admitting: Family Medicine

## 2017-01-20 ENCOUNTER — Ambulatory Visit (INDEPENDENT_AMBULATORY_CARE_PROVIDER_SITE_OTHER): Payer: Medicare HMO | Admitting: Family Medicine

## 2017-01-20 VITALS — BP 124/72 | HR 68 | Temp 97.5°F | Ht 65.0 in | Wt 108.5 lb

## 2017-01-20 DIAGNOSIS — I10 Essential (primary) hypertension: Secondary | ICD-10-CM

## 2017-01-20 DIAGNOSIS — Z8709 Personal history of other diseases of the respiratory system: Secondary | ICD-10-CM | POA: Diagnosis not present

## 2017-01-20 DIAGNOSIS — M5136 Other intervertebral disc degeneration, lumbar region: Secondary | ICD-10-CM | POA: Insufficient documentation

## 2017-01-20 DIAGNOSIS — M5441 Lumbago with sciatica, right side: Secondary | ICD-10-CM

## 2017-01-20 DIAGNOSIS — G8929 Other chronic pain: Secondary | ICD-10-CM

## 2017-01-20 DIAGNOSIS — M51369 Other intervertebral disc degeneration, lumbar region without mention of lumbar back pain or lower extremity pain: Secondary | ICD-10-CM | POA: Insufficient documentation

## 2017-01-20 DIAGNOSIS — M359 Systemic involvement of connective tissue, unspecified: Secondary | ICD-10-CM

## 2017-01-20 DIAGNOSIS — R0602 Shortness of breath: Secondary | ICD-10-CM | POA: Diagnosis not present

## 2017-01-20 DIAGNOSIS — I272 Pulmonary hypertension, unspecified: Secondary | ICD-10-CM

## 2017-01-20 MED ORDER — ALBUTEROL SULFATE HFA 108 (90 BASE) MCG/ACT IN AERS: 2.0000 | INHALATION_SPRAY | RESPIRATORY_TRACT | 3 refills | Status: DC | PRN

## 2017-01-20 NOTE — Patient Instructions (Signed)
Start using gentle heat on back for 10 minutes when you can Keep stretching Walk also as much as you can tolerate  We will refer you to physical therapy   For breathing- try the albuterol inhaler as needed  We will refer you to pulmonary   If symptoms suddenly worsen please alert [place]

## 2017-01-20 NOTE — Progress Notes (Signed)
Subjective:    Patient ID: Carrie Hendricks, female    DOB: 1950-11-11, 66 y.o.   MRN: [id number]  HPI Here for problems with her back and also shortness of breath   Wt Readings from Last 3 Encounters:  01/20/17 108 lb 8 oz (49.2 kg)  10/24/16 109 lb 12.8 oz (49.8 kg)  09/30/16 101 lb (45.8 kg)  appetite is ok / hard to keep up with the GI issues that she had  18.06 kg/m   Sob- getting worse for about 6 months  Has not been to pulmonary for years  Does not use any inhalers  Does not think she wheezes  Harder to get air out than in  No cp or cardiac symptoms  Gets anxious when sob  Can walk less than a block before getting sob   She is planning to change work to part time and work closer    Pulse ox is 90% on RA after ambulation today   Sees Dr Jefm Bryant for rheumatology  He first sent her to pulmonary    Former smoker -quit in 2007 Hx of pulmonary HTN and COPD  Hx of RA -on enbrel and plaquenil   bp is stable today  No cp or palpitations or headaches or edema  No side effects to medicines  BP Readings from Last 3 Encounters:  01/20/17 124/72  10/24/16 117/73  09/30/16 111/80     Back problems She has low back pain that radiates to her R leg and buttock  Worse to extend / she can bend forward She had xray of back at last visit with Dr Jefm Bryant  Did not think there was a neurologic problem She is doing stretches Has never done PT  No numbness in foot   [place]2018 June X-ray lumbar spine 2 to 3 views6/26/2018 [place] Result Narrative    COMPARISON: No comparisons  FINDINGS: Scoliosis.Spondylo- listhesis L4-5.Vascular calcification ,does not  appear aneurysmal.No obvious lumbar compression fracture.No definite  sacroiliitis   st xray   Generally fatigued from all of her health problems   Patient Active Problem List   Diagnosis Date Noted  . Low back pain 01/20/2017  . Scoliosis 06/28/2016  . Welcome to Medicare  preventive visit 06/10/2016  . Estrogen deficiency 06/10/2016  . Constipation 03/02/2016  . Underweight 03/02/2016  . Shortness of breath 05/16/2014  . History of COPD 05/16/2014  . Stress reaction 05/16/2014  . Connective tissue disease ([place]) 03/05/2014  . Pedal edema 10/15/2013  . Pulmonary hypertension ([place]) 10/10/2011  . Encounter for routine gynecological examination 07/11/2011  . Routine general medical examination at a health care facility 07/03/2011  . HEMORRHOIDS, INTERNAL, WITH BLEEDING 03/15/2010  . Essential hypertension 11/16/2006  . Rheumatoid arthritis(714.0) 11/16/2006  . Osteoporosis 11/16/2006   Past Medical History:  Diagnosis Date  . Arthritis    rheumatoid  . Hypertension   . Osteoporosis   . Pulmonary hypertension ([place])   . Rheumatoid arthritis(714.0)    ? scleroderma   Past Surgical History:  Procedure Laterality Date  . colonosc neg  11/08  . COLONOSCOPY WITH PROPOFOL N/A 09/30/2016   Procedure: COLONOSCOPY WITH PROPOFOL;  Surgeon: Jonathon Bellows, MD;  Location: [facility] ENDOSCOPY;  Service: Endoscopy;  Laterality: N/A;  . Dexa  07/2005   OP hip- 3.0  . ESOPHAGOGASTRODUODENOSCOPY (EGD) WITH PROPOFOL N/A 07/01/2016   Procedure: ESOPHAGOGASTRODUODENOSCOPY (EGD) WITH PROPOFOL;  Surgeon: Jonathon Bellows, MD;  Location: [facility] ENDOSCOPY;  Service: Endoscopy;  Laterality: N/A;  . FLEXIBLE  SIGMOIDOSCOPY N/A 07/01/2016   Procedure: FLEXIBLE SIGMOIDOSCOPY;  Surgeon: Jonathon Bellows, MD;  Location: [facility] ENDOSCOPY;  Service: Endoscopy;  Laterality: N/A;  . LEEP  2005  . Surgery- tendon release in hand  2006  . TUBAL LIGATION     Social History  Substance Use Topics  . Smoking status: Former Research scientist (life sciences)  . Smokeless tobacco: Never Used     Comment: Quit in 8/07  . Alcohol use No   Family History  Problem Relation Age of Onset  . Heart disease Mother   . Diabetes Mother   . Hypertension Father   . Macular degeneration Father   . Dementia Father        early  . Arthritis  Father        RA  . Alcohol abuse Father   . Breast cancer Neg Hx    No Known Allergies Current Outpatient Prescriptions on File Prior to Visit  Medication Sig Dispense Refill  . amLODipine (NORVASC) 5 MG tablet Take 1 tablet (5 mg total) by mouth daily. 90 tablet 3  . Aspirin (ADULT ASPIRIN LOW STRENGTH) 81 MG EC tablet Take 81 mg by mouth daily.      . Cholecalciferol (VITAMIN D3) 2000 UNITS capsule Take 2,000 Units by mouth daily.      Marland Kitchen etanercept (ENBREL SURECLICK) 50 MG/ML injection Inject 50 mg into the skin once a week.    . hydrochlorothiazide (HYDRODIURIL) 25 MG tablet Take 1 tablet (25 mg total) by mouth daily. 90 tablet 3  . hydroxychloroquine (PLAQUENIL) 200 MG tablet Take 200 mg by mouth daily.    . mesalamine (APRISO) 0.375 g 24 hr capsule Take 1 capsule (0.375 g total) by mouth daily. 30 capsule 3  . Multiple Vitamin (MULTIVITAMIN) capsule Take 1 capsule by mouth daily.     No current facility-administered medications on file prior to visit.     Review of Systems    Review of Systems  Constitutional: Negative for fever, appetite change, fatigue and unexpected weight change.  Eyes: Negative for pain and visual disturbance.  Respiratory: Negative for cough and shortness of breath.   Cardiovascular: Negative for cp or palpitations    Gastrointestinal: Negative for nausea, diarrhea and constipation.  Genitourinary: Negative for urgency and frequency.  Skin: Negative for pallor or rash   Neurological: Negative for weakness, light-headedness, numbness and headaches.  Hematological: Negative for adenopathy. Does not bruise/bleed easily.  Psychiatric/Behavioral: Negative for dysphoric mood. The patient is not nervous/anxious.      Objective:   Physical Exam  Constitutional: She appears well-developed and well-nourished. No distress.  underwt and well appearing   HENT:  Head: Normocephalic and atraumatic.  Mouth/Throat: Oropharynx is clear and moist.  Eyes: Pupils are  equal, round, and reactive to light. Conjunctivae and EOM are normal. No scleral icterus.  Neck: Normal range of motion. Neck supple. No JVD present. Carotid bruit is not present. No thyromegaly present.  Cardiovascular: Normal rate, regular rhythm, normal heart sounds and intact distal pulses.  Exam reveals no gallop.   Pulmonary/Chest: Effort normal and breath sounds normal. No respiratory distress. She has no wheezes. She has no rales.  No crackles  bs are very distant with wheeze on forced exp only and mildly prologed exp phase   Abdominal: Soft. Bowel sounds are normal. She exhibits no distension, no abdominal bruit and no mass. There is no tenderness.  Musculoskeletal: She exhibits tenderness. She exhibits no edema.       Lumbar back: She exhibits  decreased range of motion, tenderness and spasm. She exhibits no bony tenderness, no swelling, no edema and no deformity.  No bony tenderness Lumbar muscular tenderness worse on the R Neg SLR Nl gait  Nl hip rotatoin   Lymphadenopathy:    She has no cervical adenopathy.  Neurological: She is alert. She has normal strength and normal reflexes. She displays no atrophy. No cranial nerve deficit or sensory deficit. She exhibits normal muscle tone. Coordination normal.  Negative SLR  Skin: Skin is warm and dry. No rash noted. No erythema. No pallor.  Psychiatric: She has a normal mood and affect.          Assessment & Plan:   Problem List Items Addressed This Visit      Cardiovascular and Mediastinum   Essential hypertension    bp in fair control at this time  BP Readings from Last 1 Encounters:  01/20/17 124/72   No changes needed Disc lifstyle change with low sodium diet and exercise        Pulmonary hypertension (HCC)    Hx of pulmonary HTN ? Cause   Unsure if adding to sob  Ref to pulm        Other   Connective tissue disease ([place])    Unsure if this is affecting her resp status - doubtful Was formerly on  methotrexate Continue rheumatology f/u      History of COPD    Symptoms are worsening  Ref to pulm Refilled albuterol mdi      Relevant Orders   Ambulatory referral to Pulmonology   Low back pain    With R sided radicular symptoms (pain only) Suspect spasm Hx of scoliosis and rheumatoid dz Exam consistent with muscle spasm tx with heat/stretches and PT ref done  Consider muscle relaxer if needed       Relevant Orders   Ambulatory referral to Physical Therapy   Shortness of breath - Primary    Suspect due to progressive copd in former smoker  Unsure if rel to her RA or therapy  Will ref back to pulmonary Px for albuterol mdi for prn use with inst       Relevant Orders   Ambulatory referral to Pulmonology

## 2017-01-22 NOTE — Assessment & Plan Note (Signed)
bp in fair control at this time  BP Readings from Last 1 Encounters:  01/20/17 124/72   No changes needed Disc lifstyle change with low sodium diet and exercise

## 2017-01-22 NOTE — Assessment & Plan Note (Signed)
Suspect due to progressive copd in former smoker  Unsure if rel to her RA or therapy  Will ref back to pulmonary Px for albuterol mdi for prn use with inst

## 2017-01-22 NOTE — Assessment & Plan Note (Signed)
Hx of pulmonary HTN ? Cause   Unsure if adding to sob  Ref to pulm

## 2017-01-22 NOTE — Assessment & Plan Note (Signed)
With R sided radicular symptoms (pain only) Suspect spasm Hx of scoliosis and rheumatoid dz Exam consistent with muscle spasm tx with heat/stretches and PT ref done  Consider muscle relaxer if needed

## 2017-01-22 NOTE — Assessment & Plan Note (Signed)
Unsure if this is affecting her resp status - doubtful Was formerly on methotrexate Continue rheumatology f/u

## 2017-01-22 NOTE — Assessment & Plan Note (Signed)
Symptoms are worsening  Ref to pulm Refilled albuterol mdi

## 2017-01-23 ENCOUNTER — Ambulatory Visit: Payer: Medicare HMO | Admitting: Gastroenterology

## 2017-01-30 DIAGNOSIS — M545 Low back pain: Secondary | ICD-10-CM | POA: Diagnosis not present

## 2017-02-01 ENCOUNTER — Ambulatory Visit (INDEPENDENT_AMBULATORY_CARE_PROVIDER_SITE_OTHER): Payer: Medicare HMO | Admitting: Gastroenterology

## 2017-02-01 ENCOUNTER — Encounter: Payer: Self-pay | Admitting: Gastroenterology

## 2017-02-01 VITALS — BP 137/85 | HR 72 | Temp 98.1°F | Ht 65.0 in | Wt 109.8 lb

## 2017-02-01 DIAGNOSIS — K5190 Ulcerative colitis, unspecified, without complications: Secondary | ICD-10-CM | POA: Insufficient documentation

## 2017-02-01 DIAGNOSIS — K5100 Ulcerative (chronic) pancolitis without complications: Secondary | ICD-10-CM

## 2017-02-01 MED ORDER — MESALAMINE ER 0.375 G PO CP24: 375.0000 mg | ORAL_CAPSULE | Freq: Every day | ORAL | 1 refills | Status: DC

## 2017-02-01 NOTE — Progress Notes (Signed)
Jonathon Bellows MD, MRCP(U.K) [address]  [place]  [place], [place] [address]  Main: [phone]  Fax: [phone]   Primary Care Physician: Tower, Wynelle Fanny, MD  Primary Gastroenterologist:  Dr. Jonathon Bellows   No chief complaint on file.   [name] is a 66 y.o. female   She is here today for a follow up . She was last seen on 08/15/16 for abdominal pain and blood in her stool(colitis IBD vs NSAID induced), long term NSAID use   Summary of history :  She has had abdominal pain and  blood in her stools since 2011.At her initial visit she was on large doses of long term Advil. EGD 06/2016: biopsies showed active chronic gastritis with H pylori , marked duodenitis , normal esophageal biopsies For  a year and a half she had abdominal pains, passing "pus and blood in bowel movements" Colonoscopy 06/2016 : unable to perform colonoscopy as she had severe colitis on the left side of the colon - we had to abort the procedure at the 30 cm mark.Biopsies confirmed Active colitis with chronicity  H pylori stool antigen is negative 08/16/16  Colonoscopy was performed on 3/3/0/18 and bx of the terminal ileum was normal but biopsies of the ascending colon , transverse , descending, sigmoid and rectum showed some areas of inflammation and areas with chronic inflammation changes.   Interval history 10/2016 -02/2017  Seen by Dr Jefm Bryant and started on Humira for RA- Was discontinued as it costs a lot .   She is taking Aprisio 1 tablet a day . No rectal bleeding , no diarrhea. Feels much better overall.  Not on any NSAID's.    Current Outpatient Prescriptions  Medication Sig Dispense Refill  . Adalimumab 40 MG/0.8ML PNKT Inject into the skin.    Marland Kitchen alendronate (FOSAMAX) 70 MG tablet TAKE 1 TABLET EVERY 7 DAYS WITH A FULL GLASS OF WATER. DO NOT LIE DOWN FOR THE NEXT 30 MINUTES    . albuterol (PROVENTIL HFA;VENTOLIN HFA) 108 (90 Base) MCG/ACT inhaler Inhale 2 puffs into the  lungs every 4 (four) hours as needed for wheezing. For shortness of breath 3 Inhaler 3  . amLODipine (NORVASC) 5 MG tablet Take 1 tablet (5 mg total) by mouth daily. 90 tablet 3  . Aspirin (ADULT ASPIRIN LOW STRENGTH) 81 MG EC tablet Take 81 mg by mouth daily.      . Cholecalciferol (VITAMIN D3) 2000 UNITS capsule Take 2,000 Units by mouth daily.      Marland Kitchen etanercept (ENBREL SURECLICK) 50 MG/ML injection Inject 50 mg into the skin once a week.    . hydrochlorothiazide (HYDRODIURIL) 25 MG tablet Take 1 tablet (25 mg total) by mouth daily. 90 tablet 3  . hydroxychloroquine (PLAQUENIL) 200 MG tablet Take 200 mg by mouth daily.    . mesalamine (APRISO) 0.375 g 24 hr capsule Take 1 capsule (0.375 g total) by mouth daily. 30 capsule 3  . Multiple Vitamin (MULTIVITAMIN) capsule Take 1 capsule by mouth daily.     No current facility-administered medications for this visit.     Allergies as of 02/01/2017  . (No Known Allergies)    ROS:  General: Negative for anorexia, weight loss, fever, chills, fatigue, weakness. ENT: Negative for hoarseness, difficulty swallowing , nasal congestion. CV: Negative for chest pain, angina, palpitations, dyspnea on exertion, peripheral edema.  Respiratory: Negative for dyspnea at rest, dyspnea on exertion, cough, sputum, wheezing.  GI: See history of present illness. GU:  Negative for dysuria, hematuria, urinary incontinence, urinary frequency, nocturnal urination.  Endo: Negative for unusual weight change.    Physical Examination:   There were no vitals taken for this visit.  General: Well-nourished, well-developed in no acute distress.  Eyes: No icterus. Conjunctivae pink. Mouth: Oropharyngeal mucosa moist and pink , no lesions erythema or exudate. Lungs: Clear to auscultation bilaterally. Non-labored. Heart: Regular rate and rhythm, no murmurs rubs or gallops.  Abdomen: Bowel sounds are normal, nontender, nondistended, no hepatosplenomegaly or masses, no  abdominal bruits or hernia , no rebound or guarding.   Extremities: No lower extremity edema. No clubbing or deformities. Neuro: Alert and oriented x 3.  Grossly intact. Skin: Warm and dry, no jaundice.   Psych: Alert and cooperative, normal mood and affect.   Imaging Studies: No results found.  Assessment and Plan:   Carrie Hendricks is a 66 y.o. y/o female Here for follow up for colitis   Ulcerative colitis:On initial sigmoidoscopy she had severe colitis which improved significantly when I performed her colonoscopy after 3 months off NSAID's .Marland Kitchen It is very likely she has ulcerative colitis and her history too suggests the same , she also has RA which is also an autoimmune condition.  Started on Humira for RA by Dr Jefm Bryant which would help her colitis but is on hold as the co pay is too high . Can stop Aprisio if she starts on Humira.   Health Maintenance for IBD:  1. She is not immune to hep B- (undergoing immunization - will check for response after treatment ) 2. Will check Hep A IGG 3. Annual flu shot and needs pneumonia, Tdap vaccine from PCP office 4. Vitamin D levels  5. Annual TB testing HIV negative 10/2016 - will order   Dr Jonathon Bellows  MD,MRCP [facility]) Follow up in 6 months or sooner if needed

## 2017-02-02 DIAGNOSIS — M545 Low back pain: Secondary | ICD-10-CM | POA: Diagnosis not present

## 2017-02-06 ENCOUNTER — Other Ambulatory Visit
Admission: RE | Admit: 2017-02-06 | Discharge: 2017-02-06 | Disposition: A | Discharge status: Home / Self Care | Payer: Medicare HMO | Source: Ambulatory Visit | Attending: Gastroenterology | Admitting: Gastroenterology

## 2017-02-06 DIAGNOSIS — K5100 Ulcerative (chronic) pancolitis without complications: Secondary | ICD-10-CM | POA: Insufficient documentation

## 2017-02-07 DIAGNOSIS — M545 Low back pain: Secondary | ICD-10-CM | POA: Diagnosis not present

## 2017-02-07 LAB — VITAMIN D 25 HYDROXY (VIT D DEFICIENCY, FRACTURES): VIT D 25 HYDROXY: 74.6 ng/mL (ref 30.0–100.0)

## 2017-02-07 LAB — HEPATITIS A ANTIBODY, TOTAL: HEP A TOTAL AB: POSITIVE — AB

## 2017-02-09 ENCOUNTER — Telehealth: Payer: Self-pay

## 2017-02-09 LAB — QUANTIFERON IN TUBE
QFT TB AG MINUS NIL VALUE: 0 IU/mL
QUANTIFERON MITOGEN VALUE: 4.53 [IU]/mL
QUANTIFERON NIL VALUE: 0.06 [IU]/mL
QUANTIFERON TB AG VALUE: 0.06 [IU]/mL
QUANTIFERON TB GOLD: NEGATIVE

## 2017-02-09 LAB — QUANTIFERON TB GOLD ASSAY (BLOOD)

## 2017-02-09 NOTE — Telephone Encounter (Signed)
LVM for callback for lab results per Dr. Vicente Males.   Normal vitamin D and immune to Hep A

## 2017-02-09 NOTE — Telephone Encounter (Signed)
-----   Message from Jonathon Bellows, MD sent at 02/08/2017 11:53 AM EDT ----- Normal vitamin D and immune to Hep A

## 2017-02-10 DIAGNOSIS — M545 Low back pain: Secondary | ICD-10-CM | POA: Diagnosis not present

## 2017-02-13 ENCOUNTER — Telehealth: Payer: Self-pay | Admitting: Gastroenterology

## 2017-02-13 NOTE — Telephone Encounter (Signed)
Patient left a voice message that she is returning a call to [facility].

## 2017-02-13 NOTE — Telephone Encounter (Signed)
Patient LVM regarding test results she saw on MyChart. Please call patient.

## 2017-02-14 NOTE — Telephone Encounter (Signed)
Returned patient's 2nd call. LVM for callback to explain MyChart results.

## 2017-02-15 ENCOUNTER — Telehealth: Payer: Self-pay | Admitting: Gastroenterology

## 2017-02-15 NOTE — Telephone Encounter (Signed)
Patient would like to talk to you today regarding some test results. She has some questions. Please call

## 2017-02-17 ENCOUNTER — Encounter: Payer: Self-pay | Admitting: Pulmonary Disease

## 2017-02-17 ENCOUNTER — Telehealth: Payer: Self-pay | Admitting: Pulmonary Disease

## 2017-02-17 ENCOUNTER — Ambulatory Visit (INDEPENDENT_AMBULATORY_CARE_PROVIDER_SITE_OTHER): Payer: Medicare HMO | Admitting: Pulmonary Disease

## 2017-02-17 VITALS — BP 128/82 | HR 75 | Resp 16 | Ht 65.0 in | Wt 108.0 lb

## 2017-02-17 DIAGNOSIS — Z87891 Personal history of nicotine dependence: Secondary | ICD-10-CM | POA: Diagnosis not present

## 2017-02-17 DIAGNOSIS — J439 Emphysema, unspecified: Secondary | ICD-10-CM | POA: Diagnosis not present

## 2017-02-17 DIAGNOSIS — J449 Chronic obstructive pulmonary disease, unspecified: Secondary | ICD-10-CM | POA: Diagnosis not present

## 2017-02-17 DIAGNOSIS — R0609 Other forms of dyspnea: Secondary | ICD-10-CM

## 2017-02-17 MED ORDER — TIOTROPIUM BROMIDE-OLODATEROL 2.5-2.5 MCG/ACT IN AERS: 2.0000 | INHALATION_SPRAY | Freq: Every day | RESPIRATORY_TRACT | 2 refills | Status: AC

## 2017-02-17 MED ORDER — TIOTROPIUM BROMIDE-OLODATEROL 2.5-2.5 MCG/ACT IN AERS: 2.0000 | INHALATION_SPRAY | Freq: Every day | RESPIRATORY_TRACT | 0 refills | Status: DC

## 2017-02-17 MED ORDER — TIOTROPIUM BROMIDE-OLODATEROL 2.5-2.5 MCG/ACT IN AERS: 2.0000 | INHALATION_SPRAY | Freq: Every day | RESPIRATORY_TRACT | 5 refills | Status: DC

## 2017-02-17 NOTE — Patient Instructions (Signed)
Trial of Stiolto inhaler - samples provided and prescription entered  Follow-up in 2-3 months with lung function tests and chest x-ray prior to that visit  Upon follow-up, we will assess response to [facility] and consider pulmonary rehabilitation program

## 2017-02-17 NOTE — Telephone Encounter (Signed)
Medication has been sent to [facility]. Nothing further needed.

## 2017-02-17 NOTE — Telephone Encounter (Signed)
Patient has called insurance company to check on approved inhalers.  She can use Stiolto  Or anoro but they must be 90 day supply through [employer] order.

## 2017-02-23 NOTE — Progress Notes (Signed)
PULMONARY CONSULT NOTE  Requesting MD/Service: Tower Date of initial consultation: 02/17/17 Reason for consultation: DOE, COPD  PT PROFILE: 66 y.o. female former smoker referred for evaluation of progressive DOE and history of COPD  HPI:  As above. She has seen Dr Raul Del approx 4 yrs previously and reportedly had PFTs done but those are not available to me. She was given a trial of inhaler medications but she does not recall which one and its effect. She is a somewhat circuitous historian. She reports increased cough and DOE over the past 18 months. She is moderately limited by this. She denies CP but does have palpitations. She has chronic cough with clear to white mucus that she describes as "throat phlegm". Denies CP, fever, purulent sputum, hemoptysis, LE edema and calf tenderness.  She is now retired. She smoked 1.5 ppd for approx 35 yrs. She quit @ age 51. She is on Enbrel for RA. She was previously briefly on MTX  Past Medical History:  Diagnosis Date  . Arthritis    rheumatoid  . Hypertension   . Osteoporosis   . Pulmonary hypertension ([place])   . Rheumatoid arthritis(714.0)    ? scleroderma  She reports ulcerative colitis  Past Surgical History:  Procedure Laterality Date  . colonosc neg  11/08  . COLONOSCOPY WITH PROPOFOL N/A 09/30/2016   Procedure: COLONOSCOPY WITH PROPOFOL;  Surgeon: Jonathon Bellows, MD;  Location: [facility] ENDOSCOPY;  Service: Endoscopy;  Laterality: N/A;  . Dexa  07/2005   OP hip- 3.0  . ESOPHAGOGASTRODUODENOSCOPY (EGD) WITH PROPOFOL N/A 07/01/2016   Procedure: ESOPHAGOGASTRODUODENOSCOPY (EGD) WITH PROPOFOL;  Surgeon: Jonathon Bellows, MD;  Location: [facility] ENDOSCOPY;  Service: Endoscopy;  Laterality: N/A;  . FLEXIBLE SIGMOIDOSCOPY N/A 07/01/2016   Procedure: FLEXIBLE SIGMOIDOSCOPY;  Surgeon: Jonathon Bellows, MD;  Location: [facility] ENDOSCOPY;  Service: Endoscopy;  Laterality: N/A;  . LEEP  2005  . Surgery- tendon release in hand  2006  . TUBAL LIGATION      MEDICATIONS: I  have reviewed all medications and confirmed regimen as documented  Social History   Social History  . Marital status: Married    Spouse name: N/A  . Number of children: 3  . Years of education: N/A   Occupational History  .  [employer]   Social History Main Topics  . Smoking status: Former Research scientist (life sciences)  . Smokeless tobacco: Never Used     Comment: Quit in 8/07  . Alcohol use No  . Drug use: No  . Sexual activity: Not on file   Other Topics Concern  . 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     Family History  Problem Relation Age of Onset  . Heart disease Mother   . Diabetes Mother   . Hypertension Father   . Macular degeneration Father   . Dementia Father        early  . Arthritis Father        RA  . Alcohol abuse Father   . Breast cancer Neg Hx     ROS: No fever, myalgias/arthralgias, unexplained weight loss or weight gain No new focal weakness or sensory deficits No otalgia, hearing loss, visual changes, nasal and sinus symptoms, mouth and throat problems No neck pain or adenopathy No abdominal pain, N/V/D, diarrhea, change in bowel pattern No dysuria, change in urinary pattern   Vitals:   02/17/17 1122 02/17/17 1126  BP:  128/82  Pulse:  75  Resp: 16   SpO2:  92%  Weight: 108 lb (49 kg)   Height: 5' 5"  (1.651 m)      EXAM:   Gen: WDWN in NAD HEENT: NCAT, sclerae white, oropharynx normal Neck: NO LAN, no JVD noted Lungs: full BS, normal percussion note throughout, no adventitious sounds Cardiovascular: Reg rate, normal rhythm, no M noted Abdomen: Soft, NT, +BS Ext: no C/C/E Neuro: PERRL, EOMI, motor/sensory grossly intact Skin: No lesions noted   DATA:   BMP Latest Ref Rng & Units 10/24/2016 05/25/2016 05/15/2015  Glucose 65 - 99 mg/dL 96 94 100(H)  BUN 6 - 20 mg/dL 14 14 14   Creatinine 0.44 - 1.00 mg/dL 0.75 0.83 0.91  Sodium 135 - 145 mmol/L 138 138 142  Potassium 3.5 - 5.1  mmol/L 3.4(L) 3.7 3.6  Chloride 101 - 111 mmol/L 100(L) 100 101  CO2 22 - 32 mmol/L 31 30 28   Calcium 8.9 - 10.3 mg/dL 9.6 9.8 9.8    CBC Latest Ref Rng & Units 10/24/2016 05/25/2016 05/15/2015  WBC 3.6 - 11.0 K/uL 4.6 5.9 5.8  Hemoglobin 12.0 - 16.0 g/dL 14.9 15.3(H) 14.9  Hematocrit 35.0 - 47.0 % 43.4 44.3 43.2  Platelets 150 - 440 K/uL 189 237.0 199    CXR 05/16/14:  Severe hyperinflation and hyperlucency  IMPRESSION:     ICD-10-CM   1. Pulmonary emphysema, unspecified emphysema type ([place]) J43.9   2. Moderate COPD (chronic obstructive pulmonary disease) ([facility]) J44.9 Pulmonary Function Test [facility] Only    DG Chest 2 View  3. DOE (dyspnea on exertion) R06.09   4. Former smoker Z87.891      PLAN:  Trial of Stiolto inhaler - samples provided and prescription entered  Follow-up in 2-3 months with lung function tests and chest x-ray prior to that visit  Upon follow-up, we will assess response to [employer] and consider pulmonary rehabilitation program   Merton Border, MD PCCM service Mobile [phone] Pager [phone] 02/23/2017 3:36 PM

## 2017-03-27 DIAGNOSIS — M810 Age-related osteoporosis without current pathological fracture: Secondary | ICD-10-CM | POA: Diagnosis not present

## 2017-03-27 DIAGNOSIS — M545 Low back pain: Secondary | ICD-10-CM | POA: Diagnosis not present

## 2017-03-27 DIAGNOSIS — G8929 Other chronic pain: Secondary | ICD-10-CM | POA: Diagnosis not present

## 2017-03-27 DIAGNOSIS — M0579 Rheumatoid arthritis with rheumatoid factor of multiple sites without organ or systems involvement: Secondary | ICD-10-CM | POA: Diagnosis not present

## 2017-03-28 ENCOUNTER — Other Ambulatory Visit: Payer: Self-pay | Admitting: Rheumatology

## 2017-03-28 DIAGNOSIS — M545 Low back pain, unspecified: Secondary | ICD-10-CM

## 2017-04-03 ENCOUNTER — Ambulatory Visit: Payer: Medicare HMO

## 2017-04-03 DIAGNOSIS — Z79899 Other long term (current) drug therapy: Secondary | ICD-10-CM | POA: Diagnosis not present

## 2017-04-03 DIAGNOSIS — H25013 Cortical age-related cataract, bilateral: Secondary | ICD-10-CM | POA: Diagnosis not present

## 2017-04-03 DIAGNOSIS — H2513 Age-related nuclear cataract, bilateral: Secondary | ICD-10-CM | POA: Diagnosis not present

## 2017-04-03 DIAGNOSIS — H524 Presbyopia: Secondary | ICD-10-CM | POA: Diagnosis not present

## 2017-04-04 ENCOUNTER — Ambulatory Visit
Admission: RE | Admit: 2017-04-04 | Discharge: 2017-04-04 | Disposition: A | Discharge status: Home / Self Care | Payer: Medicare HMO | Source: Ambulatory Visit | Attending: Pulmonary Disease | Admitting: Pulmonary Disease

## 2017-04-04 ENCOUNTER — Ambulatory Visit
Admission: RE | Admit: 2017-04-04 | Discharge: 2017-04-04 | Disposition: A | Discharge status: Home / Self Care | Payer: Medicare HMO | Source: Ambulatory Visit | Attending: Rheumatology | Admitting: Rheumatology

## 2017-04-04 DIAGNOSIS — M1288 Other specific arthropathies, not elsewhere classified, other specified site: Secondary | ICD-10-CM | POA: Diagnosis not present

## 2017-04-04 DIAGNOSIS — M48061 Spinal stenosis, lumbar region without neurogenic claudication: Secondary | ICD-10-CM | POA: Diagnosis not present

## 2017-04-04 DIAGNOSIS — M5127 Other intervertebral disc displacement, lumbosacral region: Secondary | ICD-10-CM | POA: Insufficient documentation

## 2017-04-04 DIAGNOSIS — J439 Emphysema, unspecified: Secondary | ICD-10-CM | POA: Diagnosis not present

## 2017-04-04 DIAGNOSIS — I700 Atherosclerosis of aorta: Secondary | ICD-10-CM | POA: Diagnosis not present

## 2017-04-04 DIAGNOSIS — M545 Low back pain, unspecified: Secondary | ICD-10-CM

## 2017-04-04 DIAGNOSIS — R0602 Shortness of breath: Secondary | ICD-10-CM | POA: Diagnosis not present

## 2017-04-04 DIAGNOSIS — J449 Chronic obstructive pulmonary disease, unspecified: Secondary | ICD-10-CM | POA: Diagnosis not present

## 2017-04-05 DIAGNOSIS — M810 Age-related osteoporosis without current pathological fracture: Secondary | ICD-10-CM | POA: Diagnosis not present

## 2017-04-18 DIAGNOSIS — M5431 Sciatica, right side: Secondary | ICD-10-CM | POA: Diagnosis not present

## 2017-04-18 DIAGNOSIS — M9904 Segmental and somatic dysfunction of sacral region: Secondary | ICD-10-CM | POA: Diagnosis not present

## 2017-04-18 DIAGNOSIS — M9903 Segmental and somatic dysfunction of lumbar region: Secondary | ICD-10-CM | POA: Diagnosis not present

## 2017-04-18 DIAGNOSIS — M4317 Spondylolisthesis, lumbosacral region: Secondary | ICD-10-CM | POA: Diagnosis not present

## 2017-04-19 DIAGNOSIS — M4317 Spondylolisthesis, lumbosacral region: Secondary | ICD-10-CM | POA: Diagnosis not present

## 2017-04-19 DIAGNOSIS — M9903 Segmental and somatic dysfunction of lumbar region: Secondary | ICD-10-CM | POA: Diagnosis not present

## 2017-04-19 DIAGNOSIS — M9904 Segmental and somatic dysfunction of sacral region: Secondary | ICD-10-CM | POA: Diagnosis not present

## 2017-04-19 DIAGNOSIS — M5431 Sciatica, right side: Secondary | ICD-10-CM | POA: Diagnosis not present

## 2017-04-20 DIAGNOSIS — M9903 Segmental and somatic dysfunction of lumbar region: Secondary | ICD-10-CM | POA: Diagnosis not present

## 2017-04-20 DIAGNOSIS — M5431 Sciatica, right side: Secondary | ICD-10-CM | POA: Diagnosis not present

## 2017-04-20 DIAGNOSIS — M9904 Segmental and somatic dysfunction of sacral region: Secondary | ICD-10-CM | POA: Diagnosis not present

## 2017-04-20 DIAGNOSIS — M4317 Spondylolisthesis, lumbosacral region: Secondary | ICD-10-CM | POA: Diagnosis not present

## 2017-04-21 ENCOUNTER — Other Ambulatory Visit: Payer: Self-pay | Admitting: Family Medicine

## 2017-04-24 DIAGNOSIS — M4317 Spondylolisthesis, lumbosacral region: Secondary | ICD-10-CM | POA: Diagnosis not present

## 2017-04-24 DIAGNOSIS — M5431 Sciatica, right side: Secondary | ICD-10-CM | POA: Diagnosis not present

## 2017-04-24 DIAGNOSIS — M9904 Segmental and somatic dysfunction of sacral region: Secondary | ICD-10-CM | POA: Diagnosis not present

## 2017-04-24 DIAGNOSIS — M9903 Segmental and somatic dysfunction of lumbar region: Secondary | ICD-10-CM | POA: Diagnosis not present

## 2017-04-26 DIAGNOSIS — M9904 Segmental and somatic dysfunction of sacral region: Secondary | ICD-10-CM | POA: Diagnosis not present

## 2017-04-26 DIAGNOSIS — M9903 Segmental and somatic dysfunction of lumbar region: Secondary | ICD-10-CM | POA: Diagnosis not present

## 2017-04-26 DIAGNOSIS — M5431 Sciatica, right side: Secondary | ICD-10-CM | POA: Diagnosis not present

## 2017-04-26 DIAGNOSIS — M4317 Spondylolisthesis, lumbosacral region: Secondary | ICD-10-CM | POA: Diagnosis not present

## 2017-04-27 DIAGNOSIS — M5431 Sciatica, right side: Secondary | ICD-10-CM | POA: Diagnosis not present

## 2017-04-27 DIAGNOSIS — M9904 Segmental and somatic dysfunction of sacral region: Secondary | ICD-10-CM | POA: Diagnosis not present

## 2017-04-27 DIAGNOSIS — M9903 Segmental and somatic dysfunction of lumbar region: Secondary | ICD-10-CM | POA: Diagnosis not present

## 2017-04-27 DIAGNOSIS — M4317 Spondylolisthesis, lumbosacral region: Secondary | ICD-10-CM | POA: Diagnosis not present

## 2017-05-01 DIAGNOSIS — M9904 Segmental and somatic dysfunction of sacral region: Secondary | ICD-10-CM | POA: Diagnosis not present

## 2017-05-01 DIAGNOSIS — M4317 Spondylolisthesis, lumbosacral region: Secondary | ICD-10-CM | POA: Diagnosis not present

## 2017-05-01 DIAGNOSIS — M5431 Sciatica, right side: Secondary | ICD-10-CM | POA: Diagnosis not present

## 2017-05-01 DIAGNOSIS — M9903 Segmental and somatic dysfunction of lumbar region: Secondary | ICD-10-CM | POA: Diagnosis not present

## 2017-05-03 DIAGNOSIS — M9904 Segmental and somatic dysfunction of sacral region: Secondary | ICD-10-CM | POA: Diagnosis not present

## 2017-05-03 DIAGNOSIS — M9903 Segmental and somatic dysfunction of lumbar region: Secondary | ICD-10-CM | POA: Diagnosis not present

## 2017-05-03 DIAGNOSIS — M5431 Sciatica, right side: Secondary | ICD-10-CM | POA: Diagnosis not present

## 2017-05-03 DIAGNOSIS — M4317 Spondylolisthesis, lumbosacral region: Secondary | ICD-10-CM | POA: Diagnosis not present

## 2017-05-04 DIAGNOSIS — M4317 Spondylolisthesis, lumbosacral region: Secondary | ICD-10-CM | POA: Diagnosis not present

## 2017-05-04 DIAGNOSIS — M5431 Sciatica, right side: Secondary | ICD-10-CM | POA: Diagnosis not present

## 2017-05-04 DIAGNOSIS — M9903 Segmental and somatic dysfunction of lumbar region: Secondary | ICD-10-CM | POA: Diagnosis not present

## 2017-05-04 DIAGNOSIS — M9904 Segmental and somatic dysfunction of sacral region: Secondary | ICD-10-CM | POA: Diagnosis not present

## 2017-05-08 DIAGNOSIS — M5431 Sciatica, right side: Secondary | ICD-10-CM | POA: Diagnosis not present

## 2017-05-08 DIAGNOSIS — M9904 Segmental and somatic dysfunction of sacral region: Secondary | ICD-10-CM | POA: Diagnosis not present

## 2017-05-08 DIAGNOSIS — M9903 Segmental and somatic dysfunction of lumbar region: Secondary | ICD-10-CM | POA: Diagnosis not present

## 2017-05-08 DIAGNOSIS — M4317 Spondylolisthesis, lumbosacral region: Secondary | ICD-10-CM | POA: Diagnosis not present

## 2017-05-09 ENCOUNTER — Ambulatory Visit (INDEPENDENT_AMBULATORY_CARE_PROVIDER_SITE_OTHER): Payer: Medicare HMO

## 2017-05-09 DIAGNOSIS — Z23 Encounter for immunization: Secondary | ICD-10-CM

## 2017-05-10 DIAGNOSIS — M9904 Segmental and somatic dysfunction of sacral region: Secondary | ICD-10-CM | POA: Diagnosis not present

## 2017-05-10 DIAGNOSIS — M9903 Segmental and somatic dysfunction of lumbar region: Secondary | ICD-10-CM | POA: Diagnosis not present

## 2017-05-10 DIAGNOSIS — M4317 Spondylolisthesis, lumbosacral region: Secondary | ICD-10-CM | POA: Diagnosis not present

## 2017-05-10 DIAGNOSIS — M5431 Sciatica, right side: Secondary | ICD-10-CM | POA: Diagnosis not present

## 2017-05-11 DIAGNOSIS — M5431 Sciatica, right side: Secondary | ICD-10-CM | POA: Diagnosis not present

## 2017-05-11 DIAGNOSIS — M4317 Spondylolisthesis, lumbosacral region: Secondary | ICD-10-CM | POA: Diagnosis not present

## 2017-05-11 DIAGNOSIS — M9903 Segmental and somatic dysfunction of lumbar region: Secondary | ICD-10-CM | POA: Diagnosis not present

## 2017-05-11 DIAGNOSIS — M9904 Segmental and somatic dysfunction of sacral region: Secondary | ICD-10-CM | POA: Diagnosis not present

## 2017-05-16 DIAGNOSIS — M9904 Segmental and somatic dysfunction of sacral region: Secondary | ICD-10-CM | POA: Diagnosis not present

## 2017-05-16 DIAGNOSIS — M4317 Spondylolisthesis, lumbosacral region: Secondary | ICD-10-CM | POA: Diagnosis not present

## 2017-05-16 DIAGNOSIS — M9903 Segmental and somatic dysfunction of lumbar region: Secondary | ICD-10-CM | POA: Diagnosis not present

## 2017-05-16 DIAGNOSIS — M5431 Sciatica, right side: Secondary | ICD-10-CM | POA: Diagnosis not present

## 2017-05-18 ENCOUNTER — Ambulatory Visit: Payer: Medicare HMO | Attending: Pulmonary Disease

## 2017-05-18 DIAGNOSIS — M5431 Sciatica, right side: Secondary | ICD-10-CM | POA: Diagnosis not present

## 2017-05-18 DIAGNOSIS — M9903 Segmental and somatic dysfunction of lumbar region: Secondary | ICD-10-CM | POA: Diagnosis not present

## 2017-05-18 DIAGNOSIS — J449 Chronic obstructive pulmonary disease, unspecified: Secondary | ICD-10-CM | POA: Insufficient documentation

## 2017-05-18 DIAGNOSIS — M9904 Segmental and somatic dysfunction of sacral region: Secondary | ICD-10-CM | POA: Diagnosis not present

## 2017-05-18 DIAGNOSIS — M4317 Spondylolisthesis, lumbosacral region: Secondary | ICD-10-CM | POA: Diagnosis not present

## 2017-05-18 MED ORDER — ALBUTEROL SULFATE (2.5 MG/3ML) 0.083% IN NEBU
2.5000 mg | INHALATION_SOLUTION | Freq: Once | RESPIRATORY_TRACT | Status: AC
  Administered 2017-05-18: 2.5 mg via RESPIRATORY_TRACT
  Filled 2017-05-18: qty 3

## 2017-05-22 ENCOUNTER — Ambulatory Visit: Payer: Medicare HMO | Admitting: Pulmonary Disease

## 2017-05-22 DIAGNOSIS — M4317 Spondylolisthesis, lumbosacral region: Secondary | ICD-10-CM | POA: Diagnosis not present

## 2017-05-22 DIAGNOSIS — M9904 Segmental and somatic dysfunction of sacral region: Secondary | ICD-10-CM | POA: Diagnosis not present

## 2017-05-22 DIAGNOSIS — M5431 Sciatica, right side: Secondary | ICD-10-CM | POA: Diagnosis not present

## 2017-05-22 DIAGNOSIS — M9903 Segmental and somatic dysfunction of lumbar region: Secondary | ICD-10-CM | POA: Diagnosis not present

## 2017-05-24 DIAGNOSIS — M4317 Spondylolisthesis, lumbosacral region: Secondary | ICD-10-CM | POA: Diagnosis not present

## 2017-05-24 DIAGNOSIS — M5431 Sciatica, right side: Secondary | ICD-10-CM | POA: Diagnosis not present

## 2017-05-24 DIAGNOSIS — M9903 Segmental and somatic dysfunction of lumbar region: Secondary | ICD-10-CM | POA: Diagnosis not present

## 2017-05-24 DIAGNOSIS — M9904 Segmental and somatic dysfunction of sacral region: Secondary | ICD-10-CM | POA: Diagnosis not present

## 2017-05-29 DIAGNOSIS — M5431 Sciatica, right side: Secondary | ICD-10-CM | POA: Diagnosis not present

## 2017-05-29 DIAGNOSIS — M4317 Spondylolisthesis, lumbosacral region: Secondary | ICD-10-CM | POA: Diagnosis not present

## 2017-05-29 DIAGNOSIS — M9903 Segmental and somatic dysfunction of lumbar region: Secondary | ICD-10-CM | POA: Diagnosis not present

## 2017-05-29 DIAGNOSIS — M9904 Segmental and somatic dysfunction of sacral region: Secondary | ICD-10-CM | POA: Diagnosis not present

## 2017-05-31 DIAGNOSIS — M9903 Segmental and somatic dysfunction of lumbar region: Secondary | ICD-10-CM | POA: Diagnosis not present

## 2017-05-31 DIAGNOSIS — M5431 Sciatica, right side: Secondary | ICD-10-CM | POA: Diagnosis not present

## 2017-05-31 DIAGNOSIS — M9904 Segmental and somatic dysfunction of sacral region: Secondary | ICD-10-CM | POA: Diagnosis not present

## 2017-05-31 DIAGNOSIS — M4317 Spondylolisthesis, lumbosacral region: Secondary | ICD-10-CM | POA: Diagnosis not present

## 2017-06-02 ENCOUNTER — Encounter: Payer: Self-pay | Admitting: Pulmonary Disease

## 2017-06-02 ENCOUNTER — Ambulatory Visit: Payer: Medicare HMO | Admitting: Pulmonary Disease

## 2017-06-02 VITALS — BP 130/86 | HR 78 | Ht 65.0 in | Wt 112.0 lb

## 2017-06-02 DIAGNOSIS — J449 Chronic obstructive pulmonary disease, unspecified: Secondary | ICD-10-CM | POA: Diagnosis not present

## 2017-06-02 DIAGNOSIS — R636 Underweight: Secondary | ICD-10-CM | POA: Diagnosis not present

## 2017-06-02 MED ORDER — ALBUTEROL SULFATE HFA 108 (90 BASE) MCG/ACT IN AERS: 2.0000 | INHALATION_SPRAY | Freq: Four times a day (QID) | RESPIRATORY_TRACT | 10 refills | Status: DC | PRN

## 2017-06-02 NOTE — Patient Instructions (Signed)
Continue Stiolto inhaler daily Added albuterol rescue inhaler -use 1 or 2 puffs as needed for increased shortness of breath, wheezing, chest tightness You may also use albuterol prior to exercise to see if it improves shortness of breath with exertion  Follow-up in 3-4 months

## 2017-06-02 NOTE — Progress Notes (Signed)
PULMONARY OFFICE FOLLOW-UP NOTE  Requesting MD/Service: Tower Date of initial consultation: 02/17/17 Reason for consultation: DOE, COPD  PT PROFILE: 66 y.o. female former smoker referred for evaluation of progressive DOE and history of COPD  DATA: 04/04/17 CXR: Severe hyperinflation and hyperlucency without acute findings 05/18/17 PFTs: Very severe obstruction, FEV1 0.96 L (38%), hyperinflation-TLC 6.92 L (127%), RV 4.45 L (208%), DLCO 4.8 (28%), DL/VA 1.35 (37%)   SUBJ:  This is a routine reevaluation.  Upon initial evaluation, we initiated Stiolto inhaler.  She believes that this has been modestly beneficial.  She feels less short of breath.  She does not have a rescue inhaler. Denies CP, fever, purulent sputum, hemoptysis, LE edema and calf tenderness.    Vitals:   06/02/17 1602 06/02/17 1604  BP:  130/86  Pulse:  78  SpO2:  94%  Weight: 50.8 kg (112 lb)   Height: 5' 5"  (1.651 m)   RA   EXAM:  Gen: Thin, not cachectic, NAD HEENT: NCAT, sclerae white Lungs: Hyperresonant, diminished breath sounds throughout, no wheezes or other adventitious sounds Cardiovascular: Reg rate, no M noted Abdomen: Soft, NT, +BS Ext: no C/C/E Neuro: grossly intact   DATA:   BMP Latest Ref Rng & Units 10/24/2016 05/25/2016 05/15/2015  Glucose 65 - 99 mg/dL 96 94 100(H)  BUN 6 - 20 mg/dL 14 14 14   Creatinine 0.44 - 1.00 mg/dL 0.75 0.83 0.91  Sodium 135 - 145 mmol/L 138 138 142  Potassium 3.5 - 5.1 mmol/L 3.4(L) 3.7 3.6  Chloride 101 - 111 mmol/L 100(L) 100 101  CO2 22 - 32 mmol/L 31 30 28   Calcium 8.9 - 10.3 mg/dL 9.6 9.8 9.8    CBC Latest Ref Rng & Units 10/24/2016 05/25/2016 05/15/2015  WBC 3.6 - 11.0 K/uL 4.6 5.9 5.8  Hemoglobin 12.0 - 16.0 g/dL 14.9 15.3(H) 14.9  Hematocrit 35.0 - 47.0 % 43.4 44.3 43.2  Platelets 150 - 440 K/uL 189 237.0 199    CXR:  NNF  IMPRESSION:     ICD-10-CM   1. COPD, very severe ([place]) J44.9   2. Underweight R63.6   3. emphysema   PLAN:  -Cont  Stiolto inhaler -Added albuterol rescue inhaler -use 1 or 2 puffs as needed for increased sSOB, wheezing, chest tightness -She may also use albuterol prior to exercise to see if it improves shortness of breath with exertion -I encouraged that she work on weight gain with a target weight of 120 pounds -Follow-up in 3-4 months  Upon follow-up, we should discuss possible enrollment in pulmonary rehabilitation and consider whether evaluation for lung transplantation is warranted   Merton Border, MD PCCM service Mobile [phone] Pager [phone] 06/06/2017 3:03 PM

## 2017-06-05 ENCOUNTER — Encounter: Payer: Self-pay | Admitting: Gastroenterology

## 2017-06-05 ENCOUNTER — Other Ambulatory Visit
Admission: RE | Admit: 2017-06-05 | Discharge: 2017-06-05 | Disposition: A | Discharge status: Home / Self Care | Payer: Medicare HMO | Source: Ambulatory Visit | Attending: Gastroenterology | Admitting: Gastroenterology

## 2017-06-05 ENCOUNTER — Ambulatory Visit: Payer: Medicare HMO | Admitting: Gastroenterology

## 2017-06-05 DIAGNOSIS — M5431 Sciatica, right side: Secondary | ICD-10-CM | POA: Diagnosis not present

## 2017-06-05 DIAGNOSIS — M9904 Segmental and somatic dysfunction of sacral region: Secondary | ICD-10-CM | POA: Diagnosis not present

## 2017-06-05 DIAGNOSIS — K5100 Ulcerative (chronic) pancolitis without complications: Secondary | ICD-10-CM | POA: Insufficient documentation

## 2017-06-05 DIAGNOSIS — M9903 Segmental and somatic dysfunction of lumbar region: Secondary | ICD-10-CM | POA: Diagnosis not present

## 2017-06-05 DIAGNOSIS — M4317 Spondylolisthesis, lumbosacral region: Secondary | ICD-10-CM | POA: Diagnosis not present

## 2017-06-05 MED ORDER — MESALAMINE ER 0.375 G PO CP24: 375.0000 mg | ORAL_CAPSULE | Freq: Every day | ORAL | 3 refills | Status: DC

## 2017-06-05 NOTE — Progress Notes (Signed)
Carrie Bellows MD, MRCP(U.K) [address]  [place]  [place], [place] [address]  Main: [phone]  Fax: [phone]   Primary Care Physician: Tower, Carrie Fanny, MD  Primary Gastroenterologist:  Dr. Jonathon Hendricks   No chief complaint on file.   [name] is a 66 y.o. female    She is here today for a follow up . She was last seen on 02/01/2017 for abdominal pain and blood in her stool(colitis IBD vs NSAID induced), long term NSAID use   Summary of history :  She has had abdominal pain and blood in her stools since 2011.At her initial visit she was on large doses of long term Advil. EGD 06/2016: biopsies showed active chronic gastritis with H pylori , marked duodenitis , normal esophageal biopsies For  a year and a half she had abdominal pains, passing "pus and blood in bowel movements" Colonoscopy 06/2016 : unable to perform colonoscopy as she had severe colitis on the left side of the colon - we had to abort the procedure at the 30 cm mark.Biopsies confirmed Active colitis with chronicity  H pylori stool antigen is negative 08/16/16  Colonoscopy was performed on 3/3/0/18 and bx of the terminal ileum was normal but biopsies of the ascending colon , transverse , descending, sigmoid and rectum showed some areas of inflammation and areas with chronic inflammation changes.   Interval history 10/2016 -02/2017  02/06/2017 immune to hepatitis A, TB QuantiFERON negative, HIV test negative, vitamin D levels were normal  No NSAID's, once bowel movement a day , no blood. Occasional sensation that food moving through her GI tract .   Completed hepatitis B vaccination series in 05/2017 , did receive flu shot this year. Had DEXA scan in 07/2016 . She is on Embrel. Taking a calcium and vitamin D supplement. Not due for pap smear this time.     Current Outpatient Medications  Medication Sig Dispense Refill  . albuterol (PROVENTIL HFA;VENTOLIN HFA) 108 (90 Base) MCG/ACT inhaler  Inhale 2 puffs into the lungs every 6 (six) hours as needed for wheezing or shortness of breath. 1 Inhaler 10  . amLODipine (NORVASC) 5 MG tablet TAKE 1 TABLET (5 MG TOTAL) BY MOUTH DAILY. 90 tablet 3  . Aspirin (ADULT ASPIRIN LOW STRENGTH) 81 MG EC tablet Take 81 mg by mouth daily.      . Cholecalciferol (VITAMIN D3) 2000 UNITS capsule Take 2,000 Units by mouth daily.      Marland Kitchen etanercept (ENBREL SURECLICK) 50 MG/ML injection Inject 50 mg into the skin once a week.    . hydrochlorothiazide (HYDRODIURIL) 25 MG tablet TAKE 1 TABLET (25 MG TOTAL) BY MOUTH DAILY. 90 tablet 3  . hydroxychloroquine (PLAQUENIL) 200 MG tablet Take 200 mg by mouth daily.    . mesalamine (APRISO) 0.375 g 24 hr capsule Take 1 capsule (0.375 g total) by mouth daily. 90 capsule 1  . Multiple Vitamin (MULTIVITAMIN) capsule Take 1 capsule by mouth daily.    . Tiotropium Bromide-Olodaterol (STIOLTO RESPIMAT) 2.5-2.5 MCG/ACT AERS Inhale 2 puffs into the lungs daily. 3 Inhaler 5   No current facility-administered medications for this visit.     Allergies as of 06/05/2017  . (No Known Allergies)    ROS:  General: Negative for anorexia, weight loss, fever, chills, fatigue, weakness. ENT: Negative for hoarseness, difficulty swallowing , nasal congestion. CV: Negative for chest pain, angina, palpitations, dyspnea on exertion, peripheral edema.  Respiratory: Negative for dyspnea at rest, dyspnea on exertion,  cough, sputum, wheezing.  GI: See history of present illness. GU:  Negative for dysuria, hematuria, urinary incontinence, urinary frequency, nocturnal urination.  Endo: Negative for unusual weight change.    Physical Examination:   There were no vitals taken for this visit.  General: Well-nourished, well-developed in no acute distress.  Eyes: No icterus. Conjunctivae pink. Mouth: Oropharyngeal mucosa moist and pink , no lesions erythema or exudate. Lungs: Clear to auscultation bilaterally. Non-labored. Heart: Regular  rate and rhythm, no murmurs rubs or gallops.  Abdomen: Bowel sounds are normal, nontender, nondistended, no hepatosplenomegaly or masses, no abdominal bruits or hernia , no rebound or guarding.   Extremities: No lower extremity edema. No clubbing or deformities. Neuro: Alert and oriented x 3.  Grossly intact. Skin: Warm and dry, no jaundice.   Psych: Alert and cooperative, normal mood and affect.   Imaging Studies: No results found.  Assessment and Plan:   Carrie Hendricks is a 66 y.o. y/o female here to follow up for Ulcerative colitis:On initial sigmoidoscopy she had severe colitis which improved significantly when I performed her colonoscopy after 3 months off NSAID's. It is very likely she has ulcerative colitis and her history too suggests the same. Doing well on Aprisio.    Health Maintenance for IBD:  1 . Check hepatitis B vaccine status 2. Continue calcium supplement 3. Continue Aprisio- refill provided - she is on 1 tablet a day  4. Recheck labs in 6 months including CBC, CMP,CRP, Vitamin D . 5. Will repeat endoscopy in 6 months to confirm mucosal inflammation has resolved.   Dr Carrie Bellows  MD,MRCP [facility]) Follow up in 6 months

## 2017-06-06 ENCOUNTER — Encounter: Payer: Self-pay | Admitting: Pulmonary Disease

## 2017-06-06 ENCOUNTER — Telehealth: Payer: Self-pay

## 2017-06-06 LAB — HEPATITIS B SURFACE ANTIBODY, QUANTITATIVE: Hepatitis B-Post: >1000 m[IU]/mL (ref >9.9)

## 2017-06-06 NOTE — Telephone Encounter (Signed)
-----   Message from Jonathon Bellows, MD sent at 06/06/2017  7:53 AM EST ----- She is immune to hepatitis B

## 2017-06-06 NOTE — Telephone Encounter (Signed)
LVM for patient callback for results per Dr. Vicente Males.    - She is immune to hepatitis B

## 2017-06-08 DIAGNOSIS — M9904 Segmental and somatic dysfunction of sacral region: Secondary | ICD-10-CM | POA: Diagnosis not present

## 2017-06-08 DIAGNOSIS — M9903 Segmental and somatic dysfunction of lumbar region: Secondary | ICD-10-CM | POA: Diagnosis not present

## 2017-06-08 DIAGNOSIS — M5431 Sciatica, right side: Secondary | ICD-10-CM | POA: Diagnosis not present

## 2017-06-08 DIAGNOSIS — M4317 Spondylolisthesis, lumbosacral region: Secondary | ICD-10-CM | POA: Diagnosis not present

## 2017-06-10 LAB — HEPATITIS B SURFACE ANTIBODY,QUALITATIVE: HEP B S AB: REACTIVE

## 2017-06-13 ENCOUNTER — Telehealth: Payer: Self-pay

## 2017-06-13 NOTE — Telephone Encounter (Signed)
LVM for patient callback for results per Dr. Vicente Males.    - She is immune to hep b

## 2017-06-14 DIAGNOSIS — M9903 Segmental and somatic dysfunction of lumbar region: Secondary | ICD-10-CM | POA: Diagnosis not present

## 2017-06-14 DIAGNOSIS — M4317 Spondylolisthesis, lumbosacral region: Secondary | ICD-10-CM | POA: Diagnosis not present

## 2017-06-14 DIAGNOSIS — M5431 Sciatica, right side: Secondary | ICD-10-CM | POA: Diagnosis not present

## 2017-06-14 DIAGNOSIS — M9904 Segmental and somatic dysfunction of sacral region: Secondary | ICD-10-CM | POA: Diagnosis not present

## 2017-06-21 DIAGNOSIS — M5431 Sciatica, right side: Secondary | ICD-10-CM | POA: Diagnosis not present

## 2017-06-21 DIAGNOSIS — M4317 Spondylolisthesis, lumbosacral region: Secondary | ICD-10-CM | POA: Diagnosis not present

## 2017-06-21 DIAGNOSIS — M9904 Segmental and somatic dysfunction of sacral region: Secondary | ICD-10-CM | POA: Diagnosis not present

## 2017-06-21 DIAGNOSIS — M9903 Segmental and somatic dysfunction of lumbar region: Secondary | ICD-10-CM | POA: Diagnosis not present

## 2017-06-29 DIAGNOSIS — M4317 Spondylolisthesis, lumbosacral region: Secondary | ICD-10-CM | POA: Diagnosis not present

## 2017-06-29 DIAGNOSIS — M9903 Segmental and somatic dysfunction of lumbar region: Secondary | ICD-10-CM | POA: Diagnosis not present

## 2017-06-29 DIAGNOSIS — M9904 Segmental and somatic dysfunction of sacral region: Secondary | ICD-10-CM | POA: Diagnosis not present

## 2017-06-29 DIAGNOSIS — M5431 Sciatica, right side: Secondary | ICD-10-CM | POA: Diagnosis not present

## 2017-07-05 DIAGNOSIS — M5431 Sciatica, right side: Secondary | ICD-10-CM | POA: Diagnosis not present

## 2017-07-05 DIAGNOSIS — M9904 Segmental and somatic dysfunction of sacral region: Secondary | ICD-10-CM | POA: Diagnosis not present

## 2017-07-05 DIAGNOSIS — M4317 Spondylolisthesis, lumbosacral region: Secondary | ICD-10-CM | POA: Diagnosis not present

## 2017-07-05 DIAGNOSIS — M9903 Segmental and somatic dysfunction of lumbar region: Secondary | ICD-10-CM | POA: Diagnosis not present

## 2017-07-12 DIAGNOSIS — M5431 Sciatica, right side: Secondary | ICD-10-CM | POA: Diagnosis not present

## 2017-07-12 DIAGNOSIS — M9903 Segmental and somatic dysfunction of lumbar region: Secondary | ICD-10-CM | POA: Diagnosis not present

## 2017-07-12 DIAGNOSIS — M4317 Spondylolisthesis, lumbosacral region: Secondary | ICD-10-CM | POA: Diagnosis not present

## 2017-07-12 DIAGNOSIS — M9904 Segmental and somatic dysfunction of sacral region: Secondary | ICD-10-CM | POA: Diagnosis not present

## 2017-07-19 DIAGNOSIS — M9904 Segmental and somatic dysfunction of sacral region: Secondary | ICD-10-CM | POA: Diagnosis not present

## 2017-07-19 DIAGNOSIS — M9903 Segmental and somatic dysfunction of lumbar region: Secondary | ICD-10-CM | POA: Diagnosis not present

## 2017-07-19 DIAGNOSIS — M4317 Spondylolisthesis, lumbosacral region: Secondary | ICD-10-CM | POA: Diagnosis not present

## 2017-07-19 DIAGNOSIS — M5431 Sciatica, right side: Secondary | ICD-10-CM | POA: Diagnosis not present

## 2017-07-26 DIAGNOSIS — M4317 Spondylolisthesis, lumbosacral region: Secondary | ICD-10-CM | POA: Diagnosis not present

## 2017-07-26 DIAGNOSIS — M9904 Segmental and somatic dysfunction of sacral region: Secondary | ICD-10-CM | POA: Diagnosis not present

## 2017-07-26 DIAGNOSIS — M5431 Sciatica, right side: Secondary | ICD-10-CM | POA: Diagnosis not present

## 2017-07-26 DIAGNOSIS — M9903 Segmental and somatic dysfunction of lumbar region: Secondary | ICD-10-CM | POA: Diagnosis not present

## 2017-08-09 DIAGNOSIS — M9904 Segmental and somatic dysfunction of sacral region: Secondary | ICD-10-CM | POA: Diagnosis not present

## 2017-08-09 DIAGNOSIS — M5431 Sciatica, right side: Secondary | ICD-10-CM | POA: Diagnosis not present

## 2017-08-09 DIAGNOSIS — M4317 Spondylolisthesis, lumbosacral region: Secondary | ICD-10-CM | POA: Diagnosis not present

## 2017-08-09 DIAGNOSIS — M9903 Segmental and somatic dysfunction of lumbar region: Secondary | ICD-10-CM | POA: Diagnosis not present

## 2017-08-30 DIAGNOSIS — M5431 Sciatica, right side: Secondary | ICD-10-CM | POA: Diagnosis not present

## 2017-08-30 DIAGNOSIS — M4317 Spondylolisthesis, lumbosacral region: Secondary | ICD-10-CM | POA: Diagnosis not present

## 2017-08-30 DIAGNOSIS — M9904 Segmental and somatic dysfunction of sacral region: Secondary | ICD-10-CM | POA: Diagnosis not present

## 2017-08-30 DIAGNOSIS — M9903 Segmental and somatic dysfunction of lumbar region: Secondary | ICD-10-CM | POA: Diagnosis not present

## 2017-09-11 ENCOUNTER — Telehealth: Payer: Self-pay | Admitting: Family Medicine

## 2017-09-11 ENCOUNTER — Other Ambulatory Visit: Payer: Self-pay | Admitting: Family Medicine

## 2017-09-11 DIAGNOSIS — Z1231 Encounter for screening mammogram for malignant neoplasm of breast: Secondary | ICD-10-CM

## 2017-09-11 NOTE — Telephone Encounter (Signed)
Copied from [place] [id number]. Topic: Quick Communication - See Telephone Encounter >> Sep 11, 2017  4:25 PM Ivar Drape wrote: CRM for notification. See Telephone encounter for:  09/11/17. Patient called [facility] to schedule her mamo but was told her PCP had to fax something over for them to be able to schedule her for a mamo.

## 2017-09-12 DIAGNOSIS — Z1231 Encounter for screening mammogram for malignant neoplasm of breast: Secondary | ICD-10-CM | POA: Insufficient documentation

## 2017-09-12 NOTE — Telephone Encounter (Signed)
I spoke with [place] pt needs screening mammogram order before they will make appointment  Last appointment with you 06/14/17  Last cpx 06/10/16

## 2017-09-12 NOTE — Telephone Encounter (Signed)
Called patient back and left detailed message that her MMG order has been placed and she can call directly to get her MMG set up at [facility].

## 2017-09-12 NOTE — Telephone Encounter (Signed)
Order done Will sent to [facility]

## 2017-09-25 DIAGNOSIS — M48062 Spinal stenosis, lumbar region with neurogenic claudication: Secondary | ICD-10-CM | POA: Diagnosis not present

## 2017-09-25 DIAGNOSIS — M0579 Rheumatoid arthritis with rheumatoid factor of multiple sites without organ or systems involvement: Secondary | ICD-10-CM | POA: Diagnosis not present

## 2017-09-25 DIAGNOSIS — M810 Age-related osteoporosis without current pathological fracture: Secondary | ICD-10-CM | POA: Diagnosis not present

## 2017-09-25 DIAGNOSIS — I7300 Raynaud's syndrome without gangrene: Secondary | ICD-10-CM | POA: Diagnosis not present

## 2017-09-25 DIAGNOSIS — K51919 Ulcerative colitis, unspecified with unspecified complications: Secondary | ICD-10-CM | POA: Diagnosis not present

## 2017-09-26 ENCOUNTER — Other Ambulatory Visit: Payer: Self-pay | Admitting: Family Medicine

## 2017-09-26 ENCOUNTER — Ambulatory Visit
Admission: RE | Admit: 2017-09-26 | Discharge: 2017-09-26 | Disposition: A | Discharge status: Home / Self Care | Payer: Medicare HMO | Source: Ambulatory Visit | Attending: Family Medicine | Admitting: Family Medicine

## 2017-09-26 DIAGNOSIS — Z1231 Encounter for screening mammogram for malignant neoplasm of breast: Secondary | ICD-10-CM | POA: Diagnosis not present

## 2017-10-02 ENCOUNTER — Ambulatory Visit: Payer: Medicare HMO | Admitting: Pulmonary Disease

## 2017-10-02 ENCOUNTER — Encounter: Payer: Self-pay | Admitting: Pulmonary Disease

## 2017-10-02 VITALS — BP 120/60 | HR 83 | Ht 65.0 in | Wt 113.0 lb

## 2017-10-02 DIAGNOSIS — J449 Chronic obstructive pulmonary disease, unspecified: Secondary | ICD-10-CM

## 2017-10-02 MED ORDER — ALBUTEROL SULFATE HFA 108 (90 BASE) MCG/ACT IN AERS: 2.0000 | INHALATION_SPRAY | Freq: Four times a day (QID) | RESPIRATORY_TRACT | 10 refills | Status: DC | PRN

## 2017-10-02 MED ORDER — TIOTROPIUM BROMIDE-OLODATEROL 2.5-2.5 MCG/ACT IN AERS: 2.0000 | INHALATION_SPRAY | Freq: Every day | RESPIRATORY_TRACT | 5 refills | Status: DC

## 2017-10-02 NOTE — Progress Notes (Signed)
PULMONARY OFFICE FOLLOW-UP NOTE  Requesting MD/Service: Tower Date of initial consultation: 02/17/17 Reason for consultation: DOE, COPD  PT PROFILE: 67 y.o. female former smoker referred for evaluation of progressive DOE and history of COPD  DATA: 04/04/17 CXR: Severe hyperinflation and hyperlucency without acute findings 05/18/17 PFTs: Very severe obstruction, FEV1 0.96 L (38%), hyperinflation-TLC 6.92 L (127%), RV 4.45 L (208%), DLCO 4.8 (28%), DL/VA 1.35 (37%)   SUBJ:  This is a routine reevaluation.  She remains on Stiolto inhaler which she believes is beneficial.  She is never using her albuterol rescue inhaler.  She has minimal DOE. Denies CP, fever, purulent sputum, hemoptysis, LE edema and calf tenderness    Vitals:   10/02/17 1535 10/02/17 1537  BP:  120/60  Pulse:  83  SpO2:  95%  Weight: 113 lb (51.3 kg)   Height: 5' 5"  (1.651 m)   RA   EXAM:   Gen: Thin, not cachectic, NAD HEENT: NCAT, sclerae white, oropharynx normal Neck: No LAN, no JVD noted Lungs: Hyperresonant to percussion, mildly diminished breath sounds, no adventitious sounds Cardiovascular: Regular, normal rate, no M noted Abdomen: Soft, NT, +BS Ext: no C/C/E Neuro: No focal deficits on limited exam Skin: No lesions noted on limited exam    DATA:   BMP Latest Ref Rng & Units 10/24/2016 05/25/2016 05/15/2015  Glucose 65 - 99 mg/dL 96 94 100(H)  BUN 6 - 20 mg/dL 14 14 14   Creatinine 0.44 - 1.00 mg/dL 0.75 0.83 0.91  Sodium 135 - 145 mmol/L 138 138 142  Potassium 3.5 - 5.1 mmol/L 3.4(L) 3.7 3.6  Chloride 101 - 111 mmol/L 100(L) 100 101  CO2 22 - 32 mmol/L 31 30 28   Calcium 8.9 - 10.3 mg/dL 9.6 9.8 9.8    CBC Latest Ref Rng & Units 10/24/2016 05/25/2016 05/15/2015  WBC 3.6 - 11.0 K/uL 4.6 5.9 5.8  Hemoglobin 12.0 - 16.0 g/dL 14.9 15.3(H) 14.9  Hematocrit 35.0 - 47.0 % 43.4 44.3 43.2  Platelets 150 - 440 K/uL 189 237.0 199    CXR:  NNF  IMPRESSION:     ICD-10-CM   1. COPD, very severe  ([place]) J44.9   2. Emphysema  She is very well compensated and seems to have benefited from [employer] inhaler   PLAN:  Continue Stiolto inhaler Continue albuterol inhaler as needed Follow-up in 1 year or sooner as needed for any pulmonary or respiratory problems   Merton Border, MD PCCM service Mobile [phone] Pager [phone] 10/04/2017 2:45 PM

## 2017-10-02 NOTE — Patient Instructions (Signed)
Continue Stiolto inhaler Continue albuterol inhaler as needed Follow-up in 1 year or sooner as needed for any pulmonary or respiratory problems

## 2017-10-10 ENCOUNTER — Ambulatory Visit (INDEPENDENT_AMBULATORY_CARE_PROVIDER_SITE_OTHER): Payer: Medicare HMO | Admitting: Family Medicine

## 2017-10-10 ENCOUNTER — Encounter: Payer: Self-pay | Admitting: Family Medicine

## 2017-10-10 VITALS — BP 104/68 | HR 74 | Temp 97.7°F | Ht 65.0 in | Wt 113.2 lb

## 2017-10-10 DIAGNOSIS — M5136 Other intervertebral disc degeneration, lumbar region: Secondary | ICD-10-CM | POA: Diagnosis not present

## 2017-10-10 DIAGNOSIS — I272 Pulmonary hypertension, unspecified: Secondary | ICD-10-CM | POA: Diagnosis not present

## 2017-10-10 DIAGNOSIS — K5100 Ulcerative (chronic) pancolitis without complications: Secondary | ICD-10-CM

## 2017-10-10 DIAGNOSIS — M359 Systemic involvement of connective tissue, unspecified: Secondary | ICD-10-CM | POA: Diagnosis not present

## 2017-10-10 DIAGNOSIS — G479 Sleep disorder, unspecified: Secondary | ICD-10-CM | POA: Insufficient documentation

## 2017-10-10 MED ORDER — CYCLOBENZAPRINE HCL 10 MG PO TABS: 5.0000 mg | ORAL_TABLET | Freq: Every evening | ORAL | 0 refills | Status: DC | PRN

## 2017-10-10 NOTE — Assessment & Plan Note (Signed)
No clinical changes

## 2017-10-10 NOTE — Patient Instructions (Addendum)
I recommend going forward with back injection   For back pain at night and better sleep - try 2 regular strength tylenol  We can also try a muscle relaxer   If you need to try it -here is a px for flexeril ( a muscle relaxer) to take at bedtime   Try the tylenol first

## 2017-10-10 NOTE — Assessment & Plan Note (Signed)
Mostly due to back pain and inability to get comfortable in setting of RA as well  Offered px for flexeril prn  Also 2 tylenol at bedtime prn  Update Good sleep hygiene disc

## 2017-10-10 NOTE — Progress Notes (Signed)
Subjective:    Patient ID: Carrie Hendricks, female    DOB: 02-05-1951, 67 y.o.   MRN: [id number]  HPI Here with several medical problems   Back pain (in the setting of known scoliosis and rheumatoid dz) Low back  Had MRI and xrays  Has disc bulging  Tried PT and chiropractor  Dr Jefm Bryant suggested surgery -she declined  Suggested injection - rad guided epidural (Dr Raliegh Ip gave her the ok to hold the plaquenil)   nsaids used to help  Can't have them anymore due to GI anymore   Putting in a pool at her house so she can start swimming   Fatigue and not sleeping  The back pain also does not help  Uncomfortable at night  Uses heat and ice (does not sleep on heating pad)     Wt Readings from Last 3 Encounters:  10/10/17 113 lb 4 oz (51.4 kg)  10/02/17 113 lb (51.3 kg)  06/05/17 112 lb 3.2 oz (50.9 kg)   18.85 kg/m   Patient Active Problem List   Diagnosis Date Noted  . Sleep disorder 10/10/2017  . Screening mammogram, encounter for 09/12/2017  . Ulcerative colitis ([place]) 02/01/2017  . Lumbar degenerative disc disease 01/20/2017  . Colitis, chronic, ulcerative, unspecified complication ([place]) 19/37/9024  . Scoliosis 06/28/2016  . Welcome to Medicare preventive visit 06/10/2016  . Estrogen deficiency 06/10/2016  . Constipation 03/02/2016  . Underweight 03/02/2016  . Shortness of breath 05/16/2014  . History of COPD 05/16/2014  . Stress reaction 05/16/2014  . Connective tissue disease ([place]) 03/05/2014  . Pedal edema 10/15/2013  . Pulmonary hypertension ([place]) 10/10/2011  . Encounter for routine gynecological examination 07/11/2011  . Routine general medical examination at a health care facility 07/03/2011  . HEMORRHOIDS, INTERNAL, WITH BLEEDING 03/15/2010  . Essential hypertension 11/16/2006  . Rheumatoid arthritis(714.0) 11/16/2006  . Osteoporosis 11/16/2006   Past Medical History:  Diagnosis Date  . Arthritis    rheumatoid  . Hypertension   . Osteoporosis   .  Pulmonary hypertension ([place])   . Rheumatoid arthritis(714.0)    ? scleroderma   Past Surgical History:  Procedure Laterality Date  . colonosc neg  11/08  . COLONOSCOPY WITH PROPOFOL N/A 09/30/2016   Procedure: COLONOSCOPY WITH PROPOFOL;  Surgeon: Jonathon Bellows, MD;  Location: [facility] ENDOSCOPY;  Service: Endoscopy;  Laterality: N/A;  . Dexa  07/2005   OP hip- 3.0  . ESOPHAGOGASTRODUODENOSCOPY (EGD) WITH PROPOFOL N/A 07/01/2016   Procedure: ESOPHAGOGASTRODUODENOSCOPY (EGD) WITH PROPOFOL;  Surgeon: Jonathon Bellows, MD;  Location: [facility] ENDOSCOPY;  Service: Endoscopy;  Laterality: N/A;  . FLEXIBLE SIGMOIDOSCOPY N/A 07/01/2016   Procedure: FLEXIBLE SIGMOIDOSCOPY;  Surgeon: Jonathon Bellows, MD;  Location: [facility] ENDOSCOPY;  Service: Endoscopy;  Laterality: N/A;  . LEEP  2005  . Surgery- tendon release in hand  2006  . TUBAL LIGATION     Social History   Tobacco Use  . Smoking status: Former Research scientist (life sciences)  . Smokeless tobacco: Never Used  . Tobacco comment: Quit in 8/07  Substance Use Topics  . Alcohol use: No    Alcohol/week: 0.0 oz  . Drug use: No   Family History  Problem Relation Age of Onset  . Heart disease Mother   . Diabetes Mother   . Hypertension Father   . Macular degeneration Father   . Dementia Father        early  . Arthritis Father        RA  . Alcohol abuse Father   .  Breast cancer Sister 52   No Known Allergies Current Outpatient Medications on File Prior to Visit  Medication Sig Dispense Refill  . albuterol (PROVENTIL HFA;VENTOLIN HFA) 108 (90 Base) MCG/ACT inhaler Inhale 2 puffs into the lungs every 6 (six) hours as needed for wheezing or shortness of breath. 1 Inhaler 10  . amLODipine (NORVASC) 5 MG tablet TAKE 1 TABLET (5 MG TOTAL) BY MOUTH DAILY. 90 tablet 3  . Aspirin (ADULT ASPIRIN LOW STRENGTH) 81 MG EC tablet Take 81 mg by mouth daily.      . Cholecalciferol (VITAMIN D3) 2000 UNITS capsule Take 2,000 Units by mouth daily.      Marland Kitchen etanercept (ENBREL SURECLICK) 50 MG/ML  injection Inject 50 mg into the skin once a week.    . hydrochlorothiazide (HYDRODIURIL) 25 MG tablet TAKE 1 TABLET (25 MG TOTAL) BY MOUTH DAILY. 90 tablet 3  . hydroxychloroquine (PLAQUENIL) 200 MG tablet Take 200 mg by mouth daily.    . mesalamine (APRISO) 0.375 g 24 hr capsule Take 1 capsule (0.375 g total) by mouth daily. 90 capsule 3  . Multiple Vitamin (MULTIVITAMIN) capsule Take 1 capsule by mouth daily.    . Tiotropium Bromide-Olodaterol (STIOLTO RESPIMAT) 2.5-2.5 MCG/ACT AERS Inhale 2 puffs into the lungs daily. 3 Inhaler 5   No current facility-administered medications on file prior to visit.      Review of Systems  Constitutional: Positive for fatigue. Negative for activity change, appetite change, fever and unexpected weight change.  HENT: Negative for congestion, ear pain, rhinorrhea, sinus pressure and sore throat.   Eyes: Negative for pain, redness and visual disturbance.  Respiratory: Negative for cough, shortness of breath and wheezing.   Cardiovascular: Negative for chest pain and palpitations.  Gastrointestinal: Negative for abdominal pain, blood in stool, constipation and diarrhea.  Endocrine: Negative for polydipsia and polyuria.  Genitourinary: Negative for dysuria, frequency and urgency.  Musculoskeletal: Positive for arthralgias, back pain and joint swelling. Negative for myalgias.  Skin: Negative for pallor and rash.  Allergic/Immunologic: Negative for environmental allergies.  Neurological: Negative for dizziness, syncope and headaches.  Hematological: Negative for adenopathy. Does not bruise/bleed easily.  Psychiatric/Behavioral: Negative for decreased concentration and dysphoric mood. The patient is not nervous/anxious.        Objective:   Physical Exam  Constitutional: She appears well-developed and well-nourished. No distress.  Underweight and well appearing   HENT:  Head: Normocephalic and atraumatic.  Eyes: Pupils are equal, round, and reactive to  light. Conjunctivae and EOM are normal. No scleral icterus.  Neck: Normal range of motion. Neck supple.  Cardiovascular: Normal rate and regular rhythm.  Pulmonary/Chest: Effort normal and breath sounds normal. She has no wheezes. She has no rales.  Abdominal: Soft. Bowel sounds are normal. She exhibits no distension. There is no tenderness.  Musculoskeletal: She exhibits tenderness.       Right shoulder: She exhibits tenderness and bony tenderness. She exhibits no crepitus, normal pulse and normal strength.       Lumbar back: She exhibits decreased range of motion, tenderness and spasm. She exhibits no bony tenderness and no edema.  Scoliosis/kyphosis  Tender over L4-S1 Scarring on skin consistent with heating pad burn (healed) Limited flex/ext Neg SLR or neuro symptoms Nl gait   Lymphadenopathy:    She has no cervical adenopathy.  Neurological: She is alert. She has normal strength and normal reflexes. She displays no atrophy. No cranial nerve deficit or sensory deficit. She exhibits normal muscle tone. Coordination normal.  Negative  SLR  Skin: Skin is warm and dry. No rash noted. No erythema. No pallor.  Scarring on low back consistent with heating pad burn  Psychiatric: She has a normal mood and affect.          Assessment & Plan:   Problem List Items Addressed This Visit      Musculoskeletal and Integument   Lumbar degenerative disc disease - Primary    Rev MRI from Dr Lindon Romp Agree with epidural as next step (holding biologic meds if needed) Has failed PT and consv therapy  Keeping her up at night Disc use of tylenol prn  Given px for flexeril if needed for pm (with caution of sedation)  Meds ordered this encounter  Medications  . cyclobenzaprine (FLEXERIL) 10 MG tablet    Sig: Take 0.5-1 tablets (5-10 mg total) by mouth at bedtime as needed for muscle spasms.    Dispense:  30 tablet    Refill:  0         Relevant Medications   cyclobenzaprine (FLEXERIL) 10  MG tablet     Other   Sleep disorder    Mostly due to back pain and inability to get comfortable in setting of RA as well  Offered px for flexeril prn  Also 2 tylenol at bedtime prn  Update Good sleep hygiene disc

## 2017-10-10 NOTE — Assessment & Plan Note (Signed)
Rev MRI from Dr Lindon Romp Agree with epidural as next step (holding biologic meds if needed) Has failed PT and consv therapy  Keeping her up at night Disc use of tylenol prn  Given px for flexeril if needed for pm (with caution of sedation)  Meds ordered this encounter  Medications  . cyclobenzaprine (FLEXERIL) 10 MG tablet    Sig: Take 0.5-1 tablets (5-10 mg total) by mouth at bedtime as needed for muscle spasms.    Dispense:  30 tablet    Refill:  0

## 2017-10-10 NOTE — Assessment & Plan Note (Signed)
Continue f/u with Dr Jefm Bryant

## 2017-10-18 ENCOUNTER — Ambulatory Visit: Payer: Medicare HMO | Admitting: Gastroenterology

## 2017-10-24 ENCOUNTER — Ambulatory Visit: Payer: Medicare HMO | Admitting: Gastroenterology

## 2017-10-24 ENCOUNTER — Other Ambulatory Visit
Admission: RE | Admit: 2017-10-24 | Discharge: 2017-10-24 | Disposition: A | Discharge status: Home / Self Care | Payer: Medicare HMO | Source: Ambulatory Visit | Attending: Gastroenterology | Admitting: Gastroenterology

## 2017-10-24 ENCOUNTER — Encounter: Payer: Self-pay | Admitting: Gastroenterology

## 2017-10-24 VITALS — BP 122/79 | HR 73 | Ht 65.0 in | Wt 112.4 lb

## 2017-10-24 DIAGNOSIS — K5190 Ulcerative colitis, unspecified, without complications: Secondary | ICD-10-CM | POA: Diagnosis not present

## 2017-10-24 LAB — COMPREHENSIVE METABOLIC PANEL
ALBUMIN: 4.5 g/dL (ref 3.5–5.0)
ALT: 25 U/L (ref 14–54)
AST: 30 U/L (ref 15–41)
Alkaline Phosphatase: 50 U/L (ref 38–126)
Anion gap: 8 (ref 5–15)
BILIRUBIN TOTAL: 0.9 mg/dL (ref 0.3–1.2)
BUN: 12 mg/dL (ref 6–20)
CHLORIDE: 100 mmol/L — AB (ref 101–111)
CO2: 31 mmol/L (ref 22–32)
CREATININE: 0.71 mg/dL (ref 0.44–1.00)
Calcium: 9.8 mg/dL (ref 8.9–10.3)
GFR calc Af Amer: >60 mL/min (ref >60)
GLUCOSE: 96 mg/dL (ref 65–99)
POTASSIUM: 3.5 mmol/L (ref 3.5–5.1)
Sodium: 139 mmol/L (ref 135–145)
TOTAL PROTEIN: 7 g/dL (ref 6.5–8.1)

## 2017-10-24 LAB — CBC WITH DIFFERENTIAL/PLATELET
BASOS ABS: 0 10*3/uL (ref 0–0.1)
BASOS PCT: 1 %
Eosinophils Absolute: 0.1 10*3/uL (ref 0–0.7)
Eosinophils Relative: 2 %
HEMATOCRIT: 45.7 % (ref 35.0–47.0)
Hemoglobin: 15.6 g/dL (ref 12.0–16.0)
LYMPHS PCT: 40 %
Lymphs Abs: 1.9 10*3/uL (ref 1.0–3.6)
MCH: 32.8 pg (ref 26.0–34.0)
MCHC: 34 g/dL (ref 32.0–36.0)
MCV: 96.3 fL (ref 80.0–100.0)
Monocytes Absolute: 0.4 10*3/uL (ref 0.2–0.9)
Monocytes Relative: 8 %
NEUTROS ABS: 2.4 10*3/uL (ref 1.4–6.5)
NEUTROS PCT: 49 %
Platelets: 181 10*3/uL (ref 150–440)
RBC: 4.75 MIL/uL (ref 3.80–5.20)
RDW: 12.2 % (ref 11.5–14.5)
WBC: 4.8 10*3/uL (ref 3.6–11.0)

## 2017-10-24 LAB — C-REACTIVE PROTEIN

## 2017-10-24 MED ORDER — DICYCLOMINE HCL 10 MG PO CAPS: 10.0000 mg | ORAL_CAPSULE | Freq: Three times a day (TID) | ORAL | 0 refills | Status: DC

## 2017-10-24 NOTE — Progress Notes (Signed)
Jonathon Bellows MD, MRCP(U.K) [address]  [place]  [place], [place] [address]  Main: [phone]  Fax: [phone]   Primary Care Physician: Tower, Wynelle Fanny, MD  Primary Gastroenterologist:  Dr. Jonathon Bellows   No chief complaint on file.   [name] is a 67 y.o. female    She is here today for a follow up . She was last seen on 06/2017 for ulcerative colitis.   Summary of history :  She has had abdominal pain and blood in her stools since 2011.At her initial visit she was on large doses of long term Advil. EGD 06/2016: biopsies showed active chronic gastritis with H pylori , marked duodenitis , normal esophageal biopsies.[place] year and a half she had abdominal pains, passing "pus and blood in bowel movements".  Colonoscopy 06/2016 : unable to perform colonoscopy as she had severe colitis on the left side of the colon - we had to abort the procedure at the 30 cm mark.Biopsies confirmed Active colitis with chronicity  H pylori stool antigen is negative 08/16/16   Colonoscopy was performed on 3/3/0/18 and bx of the terminal ileum was normal but biopsies of the ascending colon , transverse , descending, sigmoid and rectum showed some areas of inflammation and areas with chronic inflammation changes.  02/06/2017 immune to hepatitis A/B, TB QuantiFERON negative, HIV test negative, vitamin D levels were normalHad DEXA scan in 07/2016   Interval history 06/2017-10/24/17   After she eats ha a sensation of gas and tight feeling . 1 bowel movement a day , no blood, no pus, no cramping . No NSAID's,did receive flu shot this year. . She is on Embrel. Taking a calcium and vitamin D supplement.   She says that her RA is a..cting .Takes Aprisio - no issues.     Current Outpatient Medications  Medication Sig Dispense Refill  . albuterol (PROVENTIL HFA;VENTOLIN HFA) 108 (90 Base) MCG/ACT inhaler Inhale 2 puffs into the lungs every 6 (six) hours as needed for wheezing or  shortness of breath. 1 Inhaler 10  . amLODipine (NORVASC) 5 MG tablet TAKE 1 TABLET (5 MG TOTAL) BY MOUTH DAILY. 90 tablet 3  . Aspirin (ADULT ASPIRIN LOW STRENGTH) 81 MG EC tablet Take 81 mg by mouth daily.      . Cholecalciferol (VITAMIN D3) 2000 UNITS capsule Take 2,000 Units by mouth daily.      . cyclobenzaprine (FLEXERIL) 10 MG tablet Take 0.5-1 tablets (5-10 mg total) by mouth at bedtime as needed for muscle spasms. 30 tablet 0  . etanercept (ENBREL SURECLICK) 50 MG/ML injection Inject 50 mg into the skin once a week.    . hydrochlorothiazide (HYDRODIURIL) 25 MG tablet TAKE 1 TABLET (25 MG TOTAL) BY MOUTH DAILY. 90 tablet 3  . hydroxychloroquine (PLAQUENIL) 200 MG tablet Take 200 mg by mouth daily.    . mesalamine (APRISO) 0.375 g 24 hr capsule Take 1 capsule (0.375 g total) by mouth daily. 90 capsule 3  . Multiple Vitamin (MULTIVITAMIN) capsule Take 1 capsule by mouth daily.    . Tiotropium Bromide-Olodaterol (STIOLTO RESPIMAT) 2.5-2.5 MCG/ACT AERS Inhale 2 puffs into the lungs daily. 3 Inhaler 5   No current facility-administered medications for this visit.     Allergies as of 10/24/2017  . (No Known Allergies)    ROS:  General: Negative for anorexia, weight loss, fever, chills, fatigue, weakness. ENT: Negative for hoarseness, difficulty swallowing , nasal congestion. CV: Negative for chest pain, angina, palpitations, dyspnea on  exertion, peripheral edema.  Respiratory: Negative for dyspnea at rest, dyspnea on exertion, cough, sputum, wheezing.  GI: See history of present illness. GU:  Negative for dysuria, hematuria, urinary incontinence, urinary frequency, nocturnal urination.  Endo: Negative for unusual weight change.    Physical Examination:   There were no vitals taken for this visit.  General: Well-nourished, well-developed in no acute distress.  Eyes: No icterus. Conjunctivae pink. Mouth: Oropharyngeal mucosa moist and pink , no lesions erythema or exudate. Lungs:  Clear to auscultation bilaterally. Non-labored. Heart: Regular rate and rhythm, no murmurs rubs or gallops.  Abdomen: Bowel sounds are normal, nontender, nondistended, no hepatosplenomegaly or masses, no abdominal bruits or hernia , no rebound or guarding.   Extremities: No lower extremity edema. No clubbing or deformities. Neuro: Alert and oriented x 3.  Grossly intact. Skin: Warm and dry, no jaundice.   Psych: Alert and cooperative, normal mood and affect.   Imaging Studies: Mm Screening Breast Tomo Bilateral  Result Date: 09/27/2017 CLINICAL DATA:  Screening. EXAM: DIGITAL SCREENING BILATERAL MAMMOGRAM WITH TOMO AND CAD COMPARISON:  Previous exam(s). ACR Breast Density Category d: The breast tissue is extremely dense, which lowers the sensitivity of mammography. FINDINGS: There are no findings suspicious for malignancy. Images were processed with CAD. IMPRESSION: No mammographic evidence of malignancy. A result letter of this screening mammogram will be mailed directly to the patient. RECOMMENDATION: Screening mammogram in one year. (Code:SM-B-01Y) BI-RADS CATEGORY  1: Negative. Electronically Signed   By: Nolon Nations M.D.   On: 09/27/2017 09:29    Assessment and Plan:   Carrie Hendricks is a 67 y.o. y/o female here to follow up for Ulcerativecolitis:. Doing well on Aprisio.Clinically in remission.     Health Maintenance for IBD:  1 .Recheck labs  including CBC, CMP,CRP, Vitamin D ,fecal calpropectin . 2 Will repeat endoscopy in 6 months to confirm mucosal inflammation has resolved after June 2019 per her request    Dr Jonathon Bellows  MD,MRCP [facility]) Follow up in 6 months

## 2017-10-25 NOTE — Addendum Note (Signed)
Addended by: Peggye Ley on: 10/25/2017 10:47 AM   Modules accepted: Orders, SmartSet

## 2017-10-29 ENCOUNTER — Encounter: Payer: Self-pay | Admitting: Gastroenterology

## 2017-11-13 ENCOUNTER — Telehealth: Payer: Self-pay | Admitting: Gastroenterology

## 2017-11-13 NOTE — Telephone Encounter (Signed)
Pt left vm she states she has procedure 06/7  But has a Injection for her back pain on Wednesday and would like to make sure that does not interfere with procedure please call pt

## 2017-11-15 DIAGNOSIS — M48062 Spinal stenosis, lumbar region with neurogenic claudication: Secondary | ICD-10-CM | POA: Diagnosis not present

## 2017-11-15 DIAGNOSIS — M5416 Radiculopathy, lumbar region: Secondary | ICD-10-CM | POA: Diagnosis not present

## 2017-11-15 DIAGNOSIS — M5136 Other intervertebral disc degeneration, lumbar region: Secondary | ICD-10-CM | POA: Diagnosis not present

## 2017-11-15 NOTE — Telephone Encounter (Signed)
Returned patient's call.   LVM for callback   - she states she has procedure 06/7  But has a Injection for her back pain on Wednesday and would like to make sure that does not interfere with procedure please call pt

## 2017-11-29 ENCOUNTER — Encounter: Payer: Self-pay | Admitting: Gastroenterology

## 2017-12-01 ENCOUNTER — Telehealth: Payer: Self-pay | Admitting: Gastroenterology

## 2017-12-01 NOTE — Telephone Encounter (Signed)
Pt is calling she states she has not received her instructions could you email them to her please

## 2017-12-07 ENCOUNTER — Encounter: Payer: Self-pay | Admitting: Student

## 2017-12-08 ENCOUNTER — Ambulatory Visit
Admission: RE | Admit: 2017-12-08 | Discharge: 2017-12-08 | Disposition: A | Discharge status: Home / Self Care | Payer: Medicare HMO | Source: Ambulatory Visit | Attending: Gastroenterology | Admitting: Gastroenterology

## 2017-12-08 ENCOUNTER — Encounter
Admission: RE | Disposition: A | Discharge status: Home / Self Care | Payer: Self-pay | Source: Ambulatory Visit | Attending: Gastroenterology

## 2017-12-08 ENCOUNTER — Other Ambulatory Visit: Payer: Self-pay

## 2017-12-08 ENCOUNTER — Ambulatory Visit: Payer: Medicare HMO | Admitting: Anesthesiology

## 2017-12-08 DIAGNOSIS — K640 First degree hemorrhoids: Secondary | ICD-10-CM

## 2017-12-08 DIAGNOSIS — Z7982 Long term (current) use of aspirin: Secondary | ICD-10-CM | POA: Insufficient documentation

## 2017-12-08 DIAGNOSIS — D126 Benign neoplasm of colon, unspecified: Secondary | ICD-10-CM | POA: Diagnosis not present

## 2017-12-08 DIAGNOSIS — J449 Chronic obstructive pulmonary disease, unspecified: Secondary | ICD-10-CM | POA: Insufficient documentation

## 2017-12-08 DIAGNOSIS — M069 Rheumatoid arthritis, unspecified: Secondary | ICD-10-CM | POA: Insufficient documentation

## 2017-12-08 DIAGNOSIS — D124 Benign neoplasm of descending colon: Secondary | ICD-10-CM | POA: Diagnosis not present

## 2017-12-08 DIAGNOSIS — K6389 Other specified diseases of intestine: Secondary | ICD-10-CM | POA: Insufficient documentation

## 2017-12-08 DIAGNOSIS — I10 Essential (primary) hypertension: Secondary | ICD-10-CM | POA: Diagnosis not present

## 2017-12-08 DIAGNOSIS — K5100 Ulcerative (chronic) pancolitis without complications: Secondary | ICD-10-CM | POA: Diagnosis not present

## 2017-12-08 DIAGNOSIS — Z87891 Personal history of nicotine dependence: Secondary | ICD-10-CM | POA: Insufficient documentation

## 2017-12-08 DIAGNOSIS — K5190 Ulcerative colitis, unspecified, without complications: Secondary | ICD-10-CM

## 2017-12-08 DIAGNOSIS — K635 Polyp of colon: Secondary | ICD-10-CM | POA: Diagnosis not present

## 2017-12-08 DIAGNOSIS — Z1211 Encounter for screening for malignant neoplasm of colon: Secondary | ICD-10-CM | POA: Diagnosis not present

## 2017-12-08 DIAGNOSIS — Z79899 Other long term (current) drug therapy: Secondary | ICD-10-CM | POA: Insufficient documentation

## 2017-12-08 DIAGNOSIS — K648 Other hemorrhoids: Secondary | ICD-10-CM | POA: Diagnosis not present

## 2017-12-08 HISTORY — DX: Nausea with vomiting, unspecified: R11.2

## 2017-12-08 HISTORY — PX: COLONOSCOPY WITH PROPOFOL: SHX5780

## 2017-12-08 HISTORY — DX: Nausea with vomiting, unspecified: Z98.890

## 2017-12-08 HISTORY — DX: Adverse effect of unspecified anesthetic, initial encounter: T41.45XA

## 2017-12-08 HISTORY — DX: Other complications of anesthesia, initial encounter: T88.59XA

## 2017-12-08 SURGERY — COLONOSCOPY WITH PROPOFOL
Anesthesia: General

## 2017-12-08 MED ORDER — EPHEDRINE SULFATE 50 MG/ML IJ SOLN
INTRAMUSCULAR | Status: DC | PRN
  Administered 2017-12-08: 10 mg via INTRAVENOUS
  Administered 2017-12-08: 5 mg via INTRAVENOUS

## 2017-12-08 MED ORDER — SODIUM CHLORIDE 0.9 % IV SOLN
INTRAVENOUS | Status: DC
  Administered 2017-12-08: 09:00:00 via INTRAVENOUS

## 2017-12-08 MED ORDER — PHENYLEPHRINE HCL 10 MG/ML IJ SOLN
INTRAMUSCULAR | Status: AC
  Filled 2017-12-08: qty 1

## 2017-12-08 MED ORDER — PROPOFOL 500 MG/50ML IV EMUL
INTRAVENOUS | Status: DC | PRN
  Administered 2017-12-08: 120 ug/kg/min via INTRAVENOUS

## 2017-12-08 MED ORDER — FENTANYL CITRATE (PF) 100 MCG/2ML IJ SOLN
INTRAMUSCULAR | Status: AC
  Filled 2017-12-08: qty 2

## 2017-12-08 MED ORDER — PHENYLEPHRINE HCL 10 MG/ML IJ SOLN
INTRAMUSCULAR | Status: DC | PRN
  Administered 2017-12-08: 50 ug via INTRAVENOUS

## 2017-12-08 MED ORDER — EPHEDRINE SULFATE 50 MG/ML IJ SOLN
INTRAMUSCULAR | Status: AC
  Filled 2017-12-08: qty 1

## 2017-12-08 MED ORDER — PROPOFOL 500 MG/50ML IV EMUL
INTRAVENOUS | Status: AC
  Filled 2017-12-08: qty 50

## 2017-12-08 MED ORDER — MIDAZOLAM HCL 2 MG/2ML IJ SOLN
INTRAMUSCULAR | Status: DC | PRN
  Administered 2017-12-08: 2 mg via INTRAVENOUS

## 2017-12-08 MED ORDER — FENTANYL CITRATE (PF) 100 MCG/2ML IJ SOLN
INTRAMUSCULAR | Status: DC | PRN
  Administered 2017-12-08: 50 ug via INTRAVENOUS

## 2017-12-08 MED ORDER — MIDAZOLAM HCL 2 MG/2ML IJ SOLN
INTRAMUSCULAR | Status: AC
  Filled 2017-12-08: qty 2

## 2017-12-08 NOTE — Anesthesia Preprocedure Evaluation (Signed)
Anesthesia Evaluation  Patient identified by MRN, date of birth, ID band Patient awake    Reviewed: Allergy & Precautions, NPO status , Patient's Chart, lab work & pertinent test results, reviewed documented beta blocker date and time   History of Anesthesia Complications (+) PONV and history of anesthetic complications  Airway Mallampati: II  TM Distance: >3 FB     Dental  (+) Chipped, Dental Advidsory Given   Pulmonary shortness of breath and with exertion, COPD,  COPD inhaler, neg recent URI, former smoker,           Cardiovascular Exercise Tolerance: Good hypertension, Pt. on medications (-) angina(-) CAD, (-) Past MI, (-) Cardiac Stents and (-) CABG (-) dysrhythmias (-) Valvular Problems/Murmurs     Neuro/Psych PSYCHIATRIC DISORDERS Anxiety negative neurological ROS     GI/Hepatic negative GI ROS, Neg liver ROS,   Endo/Other  negative endocrine ROS  Renal/GU negative Renal ROS     Musculoskeletal  (+) Arthritis ,   Abdominal   Peds  Hematology   Anesthesia Other Findings Past Medical History: No date: Arthritis     Comment:  rheumatoid No date: Complication of anesthesia No date: Hypertension No date: Osteoporosis No date: PONV (postoperative nausea and vomiting) No date: Pulmonary hypertension (H[facility]) No date: Rheumatoid arthritis(714.0)     Comment:  ? scleroderma   Reproductive/Obstetrics negative OB ROS                             Anesthesia Physical  Anesthesia Plan  ASA: III  Anesthesia Plan: General   Post-op Pain Management:    Induction: Intravenous  PONV Risk Score and Plan: 4 or greater and Propofol infusion  Airway Management Planned: Nasal Cannula  Additional Equipment:   Intra-op Plan:   Post-operative Plan:   Informed Consent: I have reviewed the patients History and Physical, chart, labs and discussed the procedure including the risks, benefits  and alternatives for the proposed anesthesia with the patient or authorized representative who has indicated his/her understanding and acceptance.     Plan Discussed with: CRNA  Anesthesia Plan Comments:         Anesthesia Quick Evaluation

## 2017-12-08 NOTE — Anesthesia Post-op Follow-up Note (Signed)
Anesthesia QCDR form completed.        

## 2017-12-08 NOTE — Anesthesia Procedure Notes (Signed)
Performed by: Vaughan Sine Pre-anesthesia Checklist: Patient identified, Emergency Drugs available, Suction available, Patient being monitored and Timeout performed Patient Re-evaluated:Patient Re-evaluated prior to induction Oxygen Delivery Method: Nasal cannula Preoxygenation: Pre-oxygenation with 100% oxygen Placement Confirmation: positive ETCO2 and CO2 detector

## 2017-12-08 NOTE — Op Note (Signed)
[facility] Patient Name: Carrie Hendricks Procedure Date: 12/08/2017 8:38 AM MRN: [id number] Account #: [id number] Date of Birth: 12/30/1950 Admit Type: Outpatient Age: 67 Room: [facility] ENDO ROOM 4 Gender: Female Note Status: Finalized Procedure:            Colonoscopy Indications:          Follow-up of chronic ulcerative pancolitis Providers:            Jonathon Bellows MD, MD Referring MD:         Wynelle Fanny. Tower (Referring MD) Medicines:            Monitored Anesthesia Care Complications:        No immediate complications. Procedure:            Pre-Anesthesia Assessment:                       - Prior to the procedure, a History and Physical was                        performed, and patient medications, allergies and                        sensitivities were reviewed. The patient's tolerance of                        previous anesthesia was reviewed.                       - The risks and benefits of the procedure and the                        sedation options and risks were discussed with the                        patient. All questions were answered and informed                        consent was obtained.                       - ASA Grade Assessment: III - A patient with severe                        systemic disease.                       After obtaining informed consent, the colonoscope was                        passed under direct vision. Throughout the procedure,                        the patient's blood pressure, pulse, and oxygen                        saturations were monitored continuously. The                        Colonoscope was introduced through the anus and  advanced to the the cecum, identified by the                        appendiceal orifice, IC valve and transillumination.                        The colonoscopy was performed with ease. The patient                        tolerated the procedure well. The  quality of the bowel                        preparation was good. Findings:      The perianal and digital rectal examinations were normal.      Non-bleeding internal hemorrhoids were found during retroflexion. The       hemorrhoids were large and Grade I (internal hemorrhoids that do not       prolapse).      A 3 mm polyp was found in the descending colon. The polyp was sessile.       The polyp was removed with a cold biopsy forceps. Resection and       retrieval were complete.      The exam was otherwise without abnormality on direct and retroflexion       views. Impression:           - Non-bleeding internal hemorrhoids.                       - One 3 mm polyp in the descending colon, removed with                        a cold biopsy forceps. Resected and retrieved.                       - The examination was otherwise normal on direct and                        retroflexion views. Recommendation:       - Discharge patient to home (with escort).                       - Resume previous diet.                       - Continue present medications.                       - Await pathology results.                       - Repeat colonoscopy for surveillance based on                        pathology results.                       - Return to my office in 6 weeks. Procedure Code(s):    --- Professional ---                       [phone], Colonoscopy, flexible; with biopsy, single or  multiple Diagnosis Code(s):    --- Professional ---                       D12.4, Benign neoplasm of descending colon                       K51.00, Ulcerative (chronic) pancolitis without                        complications                       K64.0, First degree hemorrhoids CPT copyright 2017 American Medical Association. All rights reserved. The codes documented in this report are preliminary and upon coder review may  be revised to meet current compliance requirements. Jonathon Bellows,  MD Jonathon Bellows MD, MD 12/08/2017 9:18:52 AM This report has been signed electronically. Number of Addenda: 0 Note Initiated On: 12/08/2017 8:38 AM Scope Withdrawal Time: 0 hours 11 minutes 31 seconds  Total Procedure Duration: 0 hours 23 minutes 41 seconds       [facility]

## 2017-12-08 NOTE — H&P (Signed)
Jonathon Bellows, MD [address], [place], [place], [place], [address] [address], [place], [place], [place], [address] Phone: [phone]  Fax: [phone]  Primary Care Physician:  Tower, Wynelle Fanny, MD   Pre-Procedure History & Physical: HPI:  Carrie Hendricks is a 67 y.o. female is here for an colonoscopy.   Past Medical History:  Diagnosis Date  . Arthritis    rheumatoid  . Hypertension   . Osteoporosis   . Pulmonary hypertension ([place])   . Rheumatoid arthritis(714.0)    ? scleroderma    Past Surgical History:  Procedure Laterality Date  . colonosc neg  11/08  . COLONOSCOPY WITH PROPOFOL N/A 09/30/2016   Procedure: COLONOSCOPY WITH PROPOFOL;  Surgeon: Jonathon Bellows, MD;  Location: [facility] END[facility]COPY;  Service: Endoscopy;  Laterality: N/A;  . Dexa  07/2005   OP hip- 3.0  . ESOPHAGOGASTRODUODENOSCOPY (EGD) WITH PROPOFOL N/A 07/01/2016   Procedure: ESOPHAGOGASTRODUODENOSCOPY (EGD) WITH PROPOFOL;  Surgeon: Jonathon Bellows, MD;  Location: [facility] ENDOSCOPY;  Service: Endoscopy;  Laterality: N/A;  . FLEXIBLE SIGMOIDOSCOPY N/A 07/01/2016   Procedure: FLEXIBLE SIGMOIDOSCOPY;  Surgeon: Jonathon Bellows, MD;  Location: [facility] ENDOSCOPY;  Service: Endoscopy;  Laterality: N/A;  . LEEP  2005  . Surgery- tendon release in hand  2006  . TUBAL LIGATION      Prior to Admission medications   Medication Sig Start Date End Date Taking? Authorizing Provider  albuterol (PROVENTIL HFA;VENTOLIN HFA) 108 (90 Base) MCG/ACT inhaler Inhale 2 puffs into the lungs every 6 (six) hours as needed for wheezing or shortness of breath. 10/02/17   Wilhelmina Mcardle, MD  amLODipine (NORVASC) 5 MG tablet TAKE 1 TABLET (5 MG TOTAL) BY MOUTH DAILY. 04/24/17   Tower, Wynelle Fanny, MD  Aspirin (ADULT ASPIRIN LOW STRENGTH) 81 MG EC tablet Take 81 mg by mouth daily.      [provider]  Cholecalciferol (VITAMIN D3) 2000 UNITS capsule Take 2,000 Units by mouth daily.      [provider]  cyclobenzaprine  (FLEXERIL) 10 MG tablet Take 0.5-1 tablets (5-10 mg total) by mouth at bedtime as needed for muscle spasms. Patient not taking: Reported on 10/24/2017 10/10/17   Tower, Wynelle Fanny, MD  dicyclomine (BENTYL) 10 MG capsule Take 1 capsule (10 mg total) by mouth 4 (four) times daily -  before meals and at bedtime. 10/24/17   Jonathon Bellows, MD  etanercept (ENBREL SURECLICK) 50 MG/ML injection Inject 50 mg into the skin once a week.    [provider]  hydrochlorothiazide (HYDRODIURIL) 25 MG tablet TAKE 1 TABLET (25 MG TOTAL) BY MOUTH DAILY. 04/24/17   Tower, Wynelle Fanny, MD  hydroxychloroquine (PLAQUENIL) 200 MG tablet Take 200 mg by mouth daily.    [provider]  mesalamine (APRISO) 0.375 g 24 hr capsule Take 1 capsule (0.375 g total) by mouth daily. 06/05/17   Jonathon Bellows, MD  Multiple Vitamin (MULTIVITAMIN) capsule Take 1 capsule by mouth daily.    [provider]  Tiotropium Bromide-Olodaterol (STIOLTO RESPIMAT) 2.5-2.5 MCG/ACT AERS Inhale 2 puffs into the lungs daily. 10/02/17   Wilhelmina Mcardle, MD    Allergies as of 10/25/2017  . (No Known Allergies)    Family History  Problem Relation Age of Onset  . Heart disease Mother   . Diabetes Mother   . Hypertension Father   . Macular degeneration Father   . Dementia Father        early  .  Arthritis Father        RA  . Alcohol abuse Father   . Breast cancer Sister 69    Social History   Socioeconomic History  . Marital status: Single    Spouse name: Not on file  . Number of children: 3  . Years of education: Not on file  . Highest education level: Not on file  Occupational History    Employer: [employer]  Social Needs  . Financial resource strain: Not on file  . Food insecurity:    Worry: Not on file    Inability: Not on file  . Transportation needs:    Medical: Not on file    Non-medical: Not on file  Tobacco Use  . Smoking status: Former Research scientist (life sciences)  . Smokeless tobacco: Never Used  . Tobacco comment: Quit in 8/07    Substance and Sexual Activity  . Alcohol use: No    Alcohol/week: 0.0 oz  . Drug use: No  . Sexual activity: Not Currently  Lifestyle  . Physical activity:    Days per week: Not on file    Minutes per session: Not on file  . Stress: Not on file  Relationships  . Social connections:    Talks on phone: Not on file    Gets together: Not on file    Attends religious service: Not on file    Active member of club or organization: Not on file    Attends meetings of clubs or organizations: Not on file    Relationship status: Not on file  . Intimate partner violence:    Fear of current or ex partner: Not on file    Emotionally abused: Not on file    Physically abused: Not on file    Forced sexual activity: Not on file  Other Topics Concern  . 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     Review of Systems: See HPI, otherwise negative ROS  Physical Exam: There were no vitals taken for this visit. General:   Alert,  pleasant and cooperative in NAD Head:  Normocephalic and atraumatic. Neck:  Supple; no masses or thyromegaly. Lungs:  Clear throughout to auscultation, normal respiratory effort.    Heart:  +S1, +S2, Regular rate and rhythm, No edema. Abdomen:  Soft, nontender and nondistended. Normal bowel sounds, without guarding, and without rebound.   Neurologic:  Alert and  oriented x4;  grossly normal neurologically.  Impression/Plan: Carrie Hendricks is here for an colonoscopy to be performed for ulcerative colitis Risks, benefits, limitations, and alternatives regarding  colonoscopy have been reviewed with the patient.  Questions have been answered.  All parties agreeable.   Jonathon Bellows, MD  12/08/2017, 8:15 AM

## 2017-12-08 NOTE — Transfer of Care (Signed)
   Immediate Anesthesia Transfer of Care Note  Patient: Carrie Hendricks  Procedure(s) Performed: COLONOSCOPY WITH PROPOFOL (N/A )  Patient Location: PACU  Anesthesia Type:General  Level of Consciousness: awake and sedated  Airway & Oxygen Therapy: Patient Spontanous Breathing and Patient connected to nasal cannula oxygen  Post-op Assessment: Report given to RN and Post -op Vital signs reviewed and stable  Post vital signs: Reviewed and stable  Last Vitals:  Vitals Value Taken Time  BP    Temp    Pulse    Resp    SpO2      Last Pain: There were no vitals filed for this visit.       Complications: No apparent anesthesia complications

## 2017-12-11 NOTE — Anesthesia Postprocedure Evaluation (Signed)
Anesthesia Post Note  Patient: Carrie Hendricks  Procedure(s) Performed: COLONOSCOPY WITH PROPOFOL (N/A )  Patient location during evaluation: Endoscopy Anesthesia Type: General Level of consciousness: awake and alert Pain management: pain level controlled Vital Signs Assessment: post-procedure vital signs reviewed and stable Respiratory status: spontaneous breathing, nonlabored ventilation, respiratory function stable and patient connected to nasal cannula oxygen Cardiovascular status: blood pressure returned to baseline and stable Postop Assessment: no apparent nausea or vomiting Anesthetic complications: no     Last Vitals:  Vitals:   12/08/17 0940 12/08/17 0950  BP: 101/68 104/69  Pulse: 78 71  Resp: 17 (!) 23  Temp:    SpO2: 96% 97%    Last Pain:  Vitals:   12/09/17 1054  TempSrc:   PainSc: 0-No pain                 Martha Clan

## 2017-12-12 ENCOUNTER — Encounter: Payer: Self-pay | Admitting: Gastroenterology

## 2017-12-12 LAB — SURGICAL PATHOLOGY

## 2017-12-18 ENCOUNTER — Telehealth: Payer: Self-pay

## 2017-12-18 NOTE — Telephone Encounter (Signed)
-----   Message from Jonathon Bellows, MD sent at 12/17/2017  4:17 PM EDT ----- Dahlia Byes inform   1. No active colitis means that her colitis is in remission   2. Single adenoma- repeat colonoscopy in 5 years

## 2017-12-18 NOTE — Telephone Encounter (Signed)
LVM for patient callback for results per Dr. Vicente Males:   - 1. No active colitis means that her colitis is in remission   2. Single adenoma- repeat colonoscopy in 5 years

## 2017-12-20 ENCOUNTER — Encounter: Payer: Self-pay | Admitting: Gastroenterology

## 2017-12-22 DIAGNOSIS — D485 Neoplasm of uncertain behavior of skin: Secondary | ICD-10-CM | POA: Diagnosis not present

## 2017-12-22 DIAGNOSIS — D2262 Melanocytic nevi of left upper limb, including shoulder: Secondary | ICD-10-CM | POA: Diagnosis not present

## 2017-12-22 DIAGNOSIS — D2261 Melanocytic nevi of right upper limb, including shoulder: Secondary | ICD-10-CM | POA: Diagnosis not present

## 2017-12-22 DIAGNOSIS — D225 Melanocytic nevi of trunk: Secondary | ICD-10-CM | POA: Diagnosis not present

## 2017-12-22 DIAGNOSIS — L821 Other seborrheic keratosis: Secondary | ICD-10-CM | POA: Diagnosis not present

## 2018-01-18 DIAGNOSIS — C44529 Squamous cell carcinoma of skin of other part of trunk: Secondary | ICD-10-CM | POA: Diagnosis not present

## 2018-01-18 DIAGNOSIS — D485 Neoplasm of uncertain behavior of skin: Secondary | ICD-10-CM | POA: Diagnosis not present

## 2018-01-31 ENCOUNTER — Ambulatory Visit: Payer: Medicare HMO | Admitting: Gastroenterology

## 2018-01-31 VITALS — BP 138/82 | HR 77 | Ht 65.0 in | Wt 110.4 lb

## 2018-01-31 DIAGNOSIS — K5190 Ulcerative colitis, unspecified, without complications: Secondary | ICD-10-CM | POA: Diagnosis not present

## 2018-01-31 NOTE — Progress Notes (Signed)
Jonathon Bellows MD, MRCP(U.K) [address]  [place]  [place], [place] [address]  Main: [phone]  Fax: [phone]   Primary Care Physician: Tower, Wynelle Fanny, MD  Primary Gastroenterologist:  Dr. Jonathon Bellows   Chief Complaint  Patient presents with  . Follow-up    to colonoscopy    [name] is a 67 y.o. female   Summary of history :  She has had abdominal pain and blood in her stools since 2011.At her initial visit she was on large doses of long term Advil. EGD 06/2016: biopsies showed active chronic gastritis with H pylori , marked duodenitis , normal esophageal biopsies.[place] year and a half she had abdominal pains, passing "pus and blood in bowel movements".  Colonoscopy 06/2016 : unable to perform colonoscopy as she had severe colitis on the left side of the colon - we had to abort the procedure at the 30 cm mark.Biopsies confirmed Active colitis with chronicity  H pylori stool antigen is negative 08/16/16   Colonoscopy was performed on 3/3/0/18 and bx of the terminal ileum was normal but biopsies of the ascending colon , transverse , descending, sigmoid and rectum showed some areas of inflammation and areas with chronic inflammation changes.  02/06/2017 immune to hepatitis A/B, TB QuantiFERON negative, HIV test negative, vitamin D levels were normalHad DEXA scan in 07/2016   Interval history 10/24/17 -01/31/18    Labs 10/2017  CRP, CBC,CMP-normal   Colonoscopy 12/2017 -single adenoma resected . Colon bx throughout the colon showed no active colitis.   Normal bowel movements, On embrel, No NSAID's . No blood in stool. No bowel movements or cramping at night .   Takes Aprisio - no issues.  Taking 1 tablet a day and doing well.       Current Outpatient Medications  Medication Sig Dispense Refill  . albuterol (PROVENTIL HFA;VENTOLIN HFA) 108 (90 Base) MCG/ACT inhaler Inhale 2 puffs into the lungs every 6 (six) hours as needed for wheezing or  shortness of breath. 1 Inhaler 10  . amLODipine (NORVASC) 5 MG tablet TAKE 1 TABLET (5 MG TOTAL) BY MOUTH DAILY. 90 tablet 3  . Aspirin (ADULT ASPIRIN LOW STRENGTH) 81 MG EC tablet Take 81 mg by mouth daily.      . Cholecalciferol (VITAMIN D3) 2000 UNITS capsule Take 2,000 Units by mouth daily.      . cyclobenzaprine (FLEXERIL) 10 MG tablet Take 0.5-1 tablets (5-10 mg total) by mouth at bedtime as needed for muscle spasms. (Patient not taking: Reported on 10/24/2017) 30 tablet 0  . dicyclomine (BENTYL) 10 MG capsule Take 1 capsule (10 mg total) by mouth 4 (four) times daily -  before meals and at bedtime. (Patient not taking: Reported on 12/08/2017) 90 capsule 0  . etanercept (ENBREL SURECLICK) 50 MG/ML injection Inject 50 mg into the skin once a week.    . hydrochlorothiazide (HYDRODIURIL) 25 MG tablet TAKE 1 TABLET (25 MG TOTAL) BY MOUTH DAILY. 90 tablet 3  . hydroxychloroquine (PLAQUENIL) 200 MG tablet Take 200 mg by mouth daily.    . mesalamine (APRISO) 0.375 g 24 hr capsule Take 1 capsule (0.375 g total) by mouth daily. 90 capsule 3  . Multiple Vitamin (MULTIVITAMIN) capsule Take 1 capsule by mouth daily.    . Tiotropium Bromide-Olodaterol (STIOLTO RESPIMAT) 2.5-2.5 MCG/ACT AERS Inhale 2 puffs into the lungs daily. 3 Inhaler 5   No current facility-administered medications for this visit.     Allergies as of 01/31/2018  . (  No Known Allergies)    ROS:  General: Negative for anorexia, weight loss, fever, chills, fatigue, weakness. ENT: Negative for hoarseness, difficulty swallowing , nasal congestion. CV: Negative for chest pain, angina, palpitations, dyspnea on exertion, peripheral edema.  Respiratory: Negative for dyspnea at rest, dyspnea on exertion, cough, sputum, wheezing.  GI: See history of present illness. GU:  Negative for dysuria, hematuria, urinary incontinence, urinary frequency, nocturnal urination.  Endo: Negative for unusual weight change.    Physical Examination:    There were no vitals taken for this visit.  General: Well-nourished, well-developed in no acute distress.  Eyes: No icterus. Conjunctivae pink. Mouth: Oropharyngeal mucosa moist and pink , no lesions erythema or exudate. Lungs: Clear to auscultation bilaterally. Non-labored. Heart: Regular rate and rhythm, no murmurs rubs or gallops.  Abdomen: Bowel sounds are normal, nontender, nondistended, no hepatosplenomegaly or masses, no abdominal bruits or hernia , no rebound or guarding.   Extremities: No lower extremity edema. No clubbing or deformities. Neuro: Alert and oriented x 3.  Grossly intact. Skin: Warm and dry, no jaundice.   Psych: Alert and cooperative, normal mood and affect.   Imaging Studies: No results found.  Assessment and Plan:   Carrie Hendricks is a 67 y.o. y/o femalehere to follow up forUlcerativecolitis:. Doing well on Aprisio one tablet daily.Clinically, endoscopically and biochemical  in remission.   Labs  1. CBC,CMP,CRP in 4-5 months   Dr Jonathon Bellows  MD,MRCP [facility]) Follow up in 8 months

## 2018-03-27 DIAGNOSIS — M48062 Spinal stenosis, lumbar region with neurogenic claudication: Secondary | ICD-10-CM | POA: Diagnosis not present

## 2018-03-27 DIAGNOSIS — M359 Systemic involvement of connective tissue, unspecified: Secondary | ICD-10-CM | POA: Diagnosis not present

## 2018-03-27 DIAGNOSIS — I7300 Raynaud's syndrome without gangrene: Secondary | ICD-10-CM | POA: Diagnosis not present

## 2018-03-27 DIAGNOSIS — K51919 Ulcerative colitis, unspecified with unspecified complications: Secondary | ICD-10-CM | POA: Diagnosis not present

## 2018-03-27 DIAGNOSIS — M0579 Rheumatoid arthritis with rheumatoid factor of multiple sites without organ or systems involvement: Secondary | ICD-10-CM | POA: Diagnosis not present

## 2018-04-09 DIAGNOSIS — H2513 Age-related nuclear cataract, bilateral: Secondary | ICD-10-CM | POA: Diagnosis not present

## 2018-04-09 DIAGNOSIS — H25013 Cortical age-related cataract, bilateral: Secondary | ICD-10-CM | POA: Diagnosis not present

## 2018-04-09 DIAGNOSIS — Z79899 Other long term (current) drug therapy: Secondary | ICD-10-CM | POA: Diagnosis not present

## 2018-04-09 DIAGNOSIS — H524 Presbyopia: Secondary | ICD-10-CM | POA: Diagnosis not present

## 2018-04-24 ENCOUNTER — Other Ambulatory Visit
Admission: RE | Admit: 2018-04-24 | Discharge: 2018-04-24 | Disposition: A | Discharge status: Home / Self Care | Payer: Medicare HMO | Source: Ambulatory Visit | Attending: Gastroenterology | Admitting: Gastroenterology

## 2018-04-24 DIAGNOSIS — K5190 Ulcerative colitis, unspecified, without complications: Secondary | ICD-10-CM | POA: Diagnosis not present

## 2018-04-24 LAB — CBC WITH DIFFERENTIAL/PLATELET
ABS IMMATURE GRANULOCYTES: 0.01 10*3/uL (ref 0.00–0.07)
BASOS ABS: 0 10*3/uL (ref 0.0–0.1)
Basophils Relative: 1 %
Eosinophils Absolute: 0.1 10*3/uL (ref 0.0–0.5)
Eosinophils Relative: 3 %
HEMATOCRIT: 43.1 % (ref 36.0–46.0)
HEMOGLOBIN: 15.2 g/dL — AB (ref 12.0–15.0)
IMMATURE GRANULOCYTES: 0 %
LYMPHS ABS: 2 10*3/uL (ref 0.7–4.0)
Lymphocytes Relative: 45 %
MCH: 33.4 pg (ref 26.0–34.0)
MCHC: 35.3 g/dL (ref 30.0–36.0)
MCV: 94.7 fL (ref 80.0–100.0)
Monocytes Absolute: 0.5 10*3/uL (ref 0.1–1.0)
Monocytes Relative: 12 %
NEUTROS ABS: 1.7 10*3/uL (ref 1.7–7.7)
Neutrophils Relative %: 39 %
Platelets: 173 10*3/uL (ref 150–400)
RBC: 4.55 MIL/uL (ref 3.87–5.11)
RDW: 11.8 % (ref 11.5–15.5)
WBC: 4.3 10*3/uL (ref 4.0–10.5)
nRBC: 0 % (ref 0.0–0.2)

## 2018-04-24 LAB — COMPREHENSIVE METABOLIC PANEL
ALBUMIN: 4.2 g/dL (ref 3.5–5.0)
ALK PHOS: 43 U/L (ref 38–126)
ALT: 21 U/L (ref 0–44)
AST: 23 U/L (ref 15–41)
Anion gap: 6 (ref 5–15)
BUN: 11 mg/dL (ref 8–23)
CALCIUM: 9.6 mg/dL (ref 8.9–10.3)
CO2: 33 mmol/L — AB (ref 22–32)
CREATININE: 0.81 mg/dL (ref 0.44–1.00)
Chloride: 100 mmol/L (ref 98–111)
GFR calc Af Amer: >60 mL/min (ref >60)
GFR calc non Af Amer: >60 mL/min (ref >60)
GLUCOSE: 86 mg/dL (ref 70–99)
Potassium: 3.4 mmol/L — ABNORMAL LOW (ref 3.5–5.1)
SODIUM: 139 mmol/L (ref 135–145)
Total Bilirubin: 0.7 mg/dL (ref 0.3–1.2)
Total Protein: 7.1 g/dL (ref 6.5–8.1)

## 2018-04-25 LAB — CALCITRIOL (1,25 DI-OH VIT D): Vit D, 1,25-Dihydroxy: 38 pg/mL (ref 19.9–79.3)

## 2018-04-29 ENCOUNTER — Encounter: Payer: Self-pay | Admitting: Gastroenterology

## 2018-06-12 ENCOUNTER — Other Ambulatory Visit: Payer: Self-pay

## 2018-06-12 NOTE — Progress Notes (Signed)
Mesalamine

## 2018-06-16 ENCOUNTER — Other Ambulatory Visit: Payer: Self-pay | Admitting: Gastroenterology

## 2018-06-18 ENCOUNTER — Other Ambulatory Visit: Payer: Self-pay

## 2018-06-18 MED ORDER — MESALAMINE ER 0.375 G PO CP24: 375.0000 mg | ORAL_CAPSULE | Freq: Every day | ORAL | 0 refills | Status: DC

## 2018-07-07 ENCOUNTER — Other Ambulatory Visit: Payer: Self-pay | Admitting: Family Medicine

## 2018-08-13 ENCOUNTER — Telehealth: Payer: Self-pay

## 2018-08-13 DIAGNOSIS — M810 Age-related osteoporosis without current pathological fracture: Secondary | ICD-10-CM

## 2018-08-13 DIAGNOSIS — I10 Essential (primary) hypertension: Secondary | ICD-10-CM

## 2018-08-13 NOTE — Telephone Encounter (Signed)
CPE labs ordered

## 2018-08-14 ENCOUNTER — Ambulatory Visit (INDEPENDENT_AMBULATORY_CARE_PROVIDER_SITE_OTHER): Payer: Medicare HMO

## 2018-08-14 VITALS — BP 102/80 | HR 80 | Temp 97.6°F | Ht 64.5 in | Wt 108.0 lb

## 2018-08-14 DIAGNOSIS — I10 Essential (primary) hypertension: Secondary | ICD-10-CM

## 2018-08-14 DIAGNOSIS — Z0000 Encounter for general adult medical examination without abnormal findings: Secondary | ICD-10-CM

## 2018-08-14 DIAGNOSIS — M810 Age-related osteoporosis without current pathological fracture: Secondary | ICD-10-CM

## 2018-08-14 LAB — COMPREHENSIVE METABOLIC PANEL
ALBUMIN: 4.4 g/dL (ref 3.5–5.2)
ALK PHOS: 49 U/L (ref 39–117)
ALT: 22 U/L (ref 0–35)
AST: 23 U/L (ref 0–37)
BUN: 13 mg/dL (ref 6–23)
CO2: 31 mEq/L (ref 19–32)
Calcium: 9.8 mg/dL (ref 8.4–10.5)
Chloride: 97 mEq/L (ref 96–112)
Creatinine, Ser: 0.8 mg/dL (ref 0.40–1.20)
GFR: 71.39 mL/min (ref >60.00)
Glucose, Bld: 106 mg/dL — ABNORMAL HIGH (ref 70–99)
POTASSIUM: 3.4 meq/L — AB (ref 3.5–5.1)
Sodium: 137 mEq/L (ref 135–145)
TOTAL PROTEIN: 7.3 g/dL (ref 6.0–8.3)
Total Bilirubin: 0.6 mg/dL (ref 0.2–1.2)

## 2018-08-14 LAB — CBC WITH DIFFERENTIAL/PLATELET
Basophils Absolute: 0.1 10*3/uL (ref 0.0–0.1)
Basophils Relative: 1 % (ref 0.0–3.0)
EOS PCT: 2.3 % (ref 0.0–5.0)
Eosinophils Absolute: 0.1 10*3/uL (ref 0.0–0.7)
HCT: 46 % (ref 36.0–46.0)
HEMOGLOBIN: 15.9 g/dL — AB (ref 12.0–15.0)
Lymphocytes Relative: 34.6 % (ref 12.0–46.0)
Lymphs Abs: 1.8 10*3/uL (ref 0.7–4.0)
MCHC: 34.5 g/dL (ref 30.0–36.0)
MCV: 97.3 fl (ref 78.0–100.0)
MONOS PCT: 9.6 % (ref 3.0–12.0)
Monocytes Absolute: 0.5 10*3/uL (ref 0.1–1.0)
Neutro Abs: 2.7 10*3/uL (ref 1.4–7.7)
Neutrophils Relative %: 52.5 % (ref 43.0–77.0)
Platelets: 191 10*3/uL (ref 150.0–400.0)
RBC: 4.73 Mil/uL (ref 3.87–5.11)
RDW: 12.7 % (ref 11.5–15.5)
WBC: 5.1 10*3/uL (ref 4.0–10.5)

## 2018-08-14 LAB — VITAMIN D 25 HYDROXY (VIT D DEFICIENCY, FRACTURES): VITD: 100.45 ng/mL — ABNORMAL HIGH (ref 30.00–100.00)

## 2018-08-14 LAB — LIPID PANEL
CHOLESTEROL: 186 mg/dL (ref 0–200)
HDL: 63.3 mg/dL (ref >39.00)
LDL Cholesterol: 105 mg/dL — ABNORMAL HIGH (ref 0–99)
NONHDL: 122.76
Total CHOL/HDL Ratio: 3
Triglycerides: 91 mg/dL (ref 0.0–149.0)
VLDL: 18.2 mg/dL (ref 0.0–40.0)

## 2018-08-14 LAB — TSH: TSH: 1.59 u[IU]/mL (ref 0.35–4.50)

## 2018-08-14 NOTE — Patient Instructions (Signed)
Carrie Hendricks , Thank you for taking time to come for your Medicare Wellness Visit. I appreciate your ongoing commitment to your health goals. Please review the following plan we discussed and let me know if I can assist you in the future.   These are the goals we discussed: Goals    . Patient Stated     Starting 08/14/2018, I will continue to drink 6-8 glasses of water daily.        This is a list of the screening recommended for you and due dates:  Health Maintenance  Topic Date Due  .  Hepatitis C: One time screening is recommended by Center for Disease Control  (CDC) for  adults born from [birth year] through 1965.   08/05/2020*  . Mammogram  09/27/2018  . Pneumonia vaccines (2 of 2 - PPSV23) 10/16/2018  . Tetanus Vaccine  06/11/2020  . Colon Cancer Screening  12/09/2022  . Flu Shot  Completed  . DEXA scan (bone density measurement)  Completed  *Topic was postponed. The date shown is not the original due date.   Preventive Care for Adults  A healthy lifestyle and preventive care can promote health and wellness. Preventive health guidelines for adults include the following key practices.  . A routine yearly physical is a good way to check with your health care provider about your health and preventive screening. It is a chance to share any concerns and updates on your health and to receive a thorough exam.  . Visit your dentist for a routine exam and preventive care every 6 months. Brush your teeth twice a day and floss once a day. Good oral hygiene prevents tooth decay and gum disease.  . The frequency of eye exams is based on your age, health, family medical history, use  of contact lenses, and other factors. Follow your health care provider's recommendations for frequency of eye exams.  . Eat a healthy diet. Foods like vegetables, fruits, whole grains, low-fat dairy products, and lean protein foods contain the nutrients you need without too many calories. Decrease your intake of foods  high in solid fats, added sugars, and salt. Eat the right amount of calories for you. Get information about a proper diet from your health care provider, if necessary.  . Regular physical exercise is one of the most important things you can do for your health. Most adults should get at least 150 minutes of moderate-intensity exercise (any activity that increases your heart rate and causes you to sweat) each week. In addition, most adults need muscle-strengthening exercises on 2 or more days a week.  Silver Sneakers may be a benefit available to you. To determine eligibility, you may visit the website: www.silversneakers.com or contact program at 859-273-6399 Mon-Fri between 8AM-8PM.   . Maintain a healthy weight. The body mass index (BMI) is a screening tool to identify possible weight problems. It provides an estimate of body fat based on height and weight. Your health care provider can find your BMI and can help you achieve or maintain a healthy weight.   For adults 20 years and older: ? A BMI below 18.5 is considered underweight. ? A BMI of 18.5 to 24.9 is normal. ? A BMI of 25 to 29.9 is considered overweight. ? A BMI of 30 and above is considered obese.   . Maintain normal blood lipids and cholesterol levels by exercising and minimizing your intake of saturated fat. Eat a balanced diet with plenty of fruit and vegetables. Blood tests for  lipids and cholesterol should begin at age 62 and be repeated every 5 years. If your lipid or cholesterol levels are high, you are over 50, or you are at high risk for heart disease, you may need your cholesterol levels checked more frequently. Ongoing high lipid and cholesterol levels should be treated with medicines if diet and exercise are not working.  . If you smoke, find out from your health care provider how to quit. If you do not use tobacco, please do not start.  . If you choose to drink alcohol, please do not consume more than 2 drinks per day.  One drink is considered to be 12 ounces (355 mL) of beer, 5 ounces (148 mL) of wine, or 1.5 ounces (44 mL) of liquor.  . If you are 60-9 years old, ask your health care provider if you should take aspirin to prevent strokes.  . Use sunscreen. Apply sunscreen liberally and repeatedly throughout the day. You should seek shade when your shadow is shorter than you. Protect yourself by wearing long sleeves, pants, a wide-brimmed hat, and sunglasses year round, whenever you are outdoors.  . Once a month, do a whole body skin exam, using a mirror to look at the skin on your back. Tell your health care provider of new moles, moles that have irregular borders, moles that are larger than a pencil eraser, or moles that have changed in shape or color.

## 2018-08-14 NOTE — Progress Notes (Signed)
PCP notes:   Health maintenance:  No gaps identified.  Abnormal screenings:   Hearing - failed  Hearing Screening   125Hz  250Hz  500Hz  1000Hz  2000Hz  3000Hz  4000Hz  6000Hz  8000Hz   Right ear:   40 40 40  40    Left ear:   40 40 40  0     Patient concerns:   None  Nurse concerns:  None  Next PCP appt:   08/15/18 @ 1515  I reviewed health advisor's note, was available for consultation, and agree with documentation and plan. Loura Pardon MD

## 2018-08-14 NOTE — Progress Notes (Signed)
Subjective:   Carrie Hendricks is a 67 y.o. female who presents for an Initial Medicare Annual Wellness Visit.  Review of Systems    N/A  Cardiac Risk Factors include: advanced age (>79mn, >>73women);hypertension     Objective:    Today's Vitals   08/14/18 1215  BP: 102/80  Pulse: 80  Temp: 97.6 F (36.4 C)  TempSrc: Oral  SpO2: 99%  Weight: 108 lb (49 kg)  Height: 5' 4.5" (1.638 m)  PainSc: 8   PainLoc: Back   Body mass index is 18.25 kg/m.  Advanced Directives 08/14/2018 12/08/2017 09/30/2016 07/01/2016  Does Patient Have a Medical Advance Directive? No No No No  Would patient like information on creating a medical advance directive? No - Patient declined Yes (MAU/Ambulatory/Procedural Areas - Information given) No - Patient declined -    Current Medications (verified) Outpatient Encounter Medications as of 08/14/2018  Medication Sig  . albuterol (PROVENTIL HFA;VENTOLIN HFA) 108 (90 Base) MCG/ACT inhaler Inhale 2 puffs into the lungs every 6 (six) hours as needed for wheezing or shortness of breath.  .Marland KitchenamLODipine (NORVASC) 5 MG tablet TAKE 1 TABLET (5 MG TOTAL) BY MOUTH DAILY.  .Marland KitchenAspirin (ADULT ASPIRIN LOW STRENGTH) 81 MG EC tablet Take 81 mg by mouth daily.    . Cholecalciferol (VITAMIN D3) 2000 UNITS capsule Take 2,000 Units by mouth daily.    .Marland Kitchenetanercept (ENBREL SURECLICK) 50 MG/ML injection Inject 50 mg into the skin once a week.  . hydrochlorothiazide (HYDRODIURIL) 25 MG tablet TAKE 1 TABLET (25 MG TOTAL) BY MOUTH DAILY.  . hydroxychloroquine (PLAQUENIL) 200 MG tablet Take 200 mg by mouth daily.  . mesalamine (APRISO) 0.375 g 24 hr capsule Take 1 capsule (0.375 g total) by mouth daily.  . Multiple Vitamin (MULTIVITAMIN) capsule Take 1 capsule by mouth daily.  . Tiotropium Bromide-Olodaterol (STIOLTO RESPIMAT) 2.5-2.5 MCG/ACT AERS Inhale 2 puffs into the lungs daily.  . [DISCONTINUED] cyclobenzaprine (FLEXERIL) 10 MG tablet Take 0.5-1 tablets (5-10 mg total) by  mouth at bedtime as needed for muscle spasms.  . [DISCONTINUED] dicyclomine (BENTYL) 10 MG capsule Take 1 capsule (10 mg total) by mouth 4 (four) times daily -  before meals and at bedtime. (Patient not taking: Reported on 12/08/2017)   No facility-administered encounter medications on file as of 08/14/2018.     Allergies (verified) Patient has no known allergies.   History: Past Medical History:  Diagnosis Date  . Arthritis    rheumatoid  . Complication of anesthesia   . Hypertension   . Osteoporosis   . PONV (postoperative nausea and vomiting)   . Pulmonary hypertension ([place]   . Rheumatoid arthritis(714.0)    ? scleroderma   Past Surgical History:  Procedure Laterality Date  . colonosc neg  11/08  . COLONOSCOPY WITH PROPOFOL N/A 09/30/2016   Procedure: COLONOSCOPY WITH PROPOFOL;  Surgeon: KJonathon Bellows MD;  Location: [facility] ENDOSCOPY;  Service: Endoscopy;  Laterality: N/A;  . COLONOSCOPY WITH PROPOFOL N/A 12/08/2017   Procedure: COLONOSCOPY WITH PROPOFOL;  Surgeon: AJonathon Bellows MD;  [facility];  Service: Gastroenterology;  Laterality: N/A;  . Dexa  07/2005   OP hip- 3.0  . ESOPHAGOGASTRODUODENOSCOPY (EGD) WITH PROPOFOL N/A 07/01/2016   Procedure: ESOPHAGOGASTRODUODENOSCOPY (EGD) WITH PROPOFOL;  Surgeon: KJonathon Bellows MD;  Location: [facility] ENDOSCOPY;  Service: Endoscopy;  Laterality: N/A;  . FLEXIBLE SIGMOIDOSCOPY N/A 07/01/2016   Procedure: FLEXIBLE SIGMOIDOSCOPY;  Surgeon: KJonathon Bellows MD;  Location: [facility] ENDOSCOPY;  Service: Endoscopy;  Laterality: N/A;  .  LEEP  2005  . Surgery- tendon release in hand  2006  . TUBAL LIGATION     Family History  Problem Relation Age of Onset  . Heart disease Mother   . Diabetes Mother   . Hypertension Father   . Macular degeneration Father   . Dementia Father        early  . Arthritis Father        RA  . Alcohol abuse Father   . Breast cancer Sister 8   Social History   Socioeconomic History  . Marital status: Single     Spouse name: Not on file  . Number of children: 3  . Years of education: Not on file  . Highest education level: Not on file  Occupational History    Employer: [employer]  Social Needs  . Financial resource strain: Not on file  . Food insecurity:    Worry: Not on file    Inability: Not on file  . Transportation needs:    Medical: Not on file    Non-medical: Not on file  Tobacco Use  . Smoking status: Former Smoker    Last attempt to quit: 2006    Years since quitting: 14.1  . Smokeless tobacco: Never Used  . Tobacco comment: Quit in 8/07  Substance and Sexual Activity  . Alcohol use: No    Alcohol/week: 0.0 standard drinks  . Drug use: No  . Sexual activity: Not Currently  Lifestyle  . Physical activity:    Days per week: Not on file    Minutes per session: Not on file  . Stress: Not on file  Relationships  . Social connections:    Talks on phone: Not on file    Gets together: Not on file    Attends religious service: Not on file    Active member of club or organization: Not on file    Attends meetings of clubs or organizations: Not on file    Relationship status: Not on file  Other Topics Concern  . 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     Tobacco Counseling Counseling given: No Comment: Quit in 8/07   Clinical Intake:  Pre-visit preparation completed: Yes  Pain : No/denies pain Pain Score: 8      Nutritional Status: BMI <19  Underweight Nutritional Risks: None Diabetes: No  How often do you need to have someone help you when you read instructions, pamphlets, or other written materials from your doctor or pharmacy?: 1 - Never What is the last grade level you completed in school?: 12th grade  Interpreter Needed?: No  Information entered by :: LPinson, LPN   Activities of Daily Living In your present state of health, do you have any difficulty performing the following activities:  08/14/2018  Hearing? N  Vision? N  Difficulty concentrating or making decisions? N  Walking or climbing stairs? Y  Dressing or bathing? N  Doing errands, shopping? N  Preparing Food and eating ? N  Using the Toilet? N  In the past six months, have you accidently leaked urine? N  Do you have problems with loss of bowel control? N  Managing your Medications? N  Managing your Finances? N  Housekeeping or managing your Housekeeping? N  Some recent data might be hidden     Immunizations and Health Maintenance Immunization History  Administered Date(s) Administered  . Hep A / Hep  B 11/03/2016, 12/06/2016  . Hepatitis B, adult 05/09/2017  . Influenza Split 04/03/2011  . Influenza Whole 04/03/2010  . Influenza, High Dose Seasonal PF 04/08/2016, 03/31/2017, 04/21/2018  . Influenza,inj,Quad PF,6+ Mos 03/05/2014  . Influenza-Unspecified 04/03/2013, 03/05/2014, 05/03/2015  . Pneumococcal Conjugate-13 06/10/2016  . Pneumococcal Polysaccharide-23 03/28/2007, 10/15/2013  . Td 06/11/2010   There are no preventive care reminders to display for this patient.  Patient Care Team: Tower, Wynelle Fanny, MD as PCP - General  Indicate any recent Medical Services you may have received from other than [facility] providers in the past year (date may be approximate).     Assessment:   This is a routine wellness examination for [facility].  Hearing/Vision screen  Hearing Screening   125Hz  250Hz  500Hz  1000Hz  2000Hz  3000Hz  4000Hz  6000Hz  8000Hz   Right ear:   40 40 40  40    Left ear:   40 40 40  0    Vision Screening Comments: Vision exam in Oct 2019 with Dr. Kerin Ransom  Dietary issues and exercise activities discussed: Current Exercise Habits: The patient does not participate in regular exercise at present, Exercise limited by: None identified  Goals    . Patient Stated     Starting 08/14/2018, I will continue to drink 6-8 glasses of water daily.       Depression Screen PHQ 2/9 Scores 08/14/2018  PHQ - 2  Score 0  PHQ- 9 Score 0    Fall Risk Fall Risk  08/14/2018  Falls in the past year? 0   Cognitive Function: MMSE - Mini Mental State Exam 08/14/2018  Orientation to time 5  Orientation to Place 5  Registration 3  Attention/ Calculation 0  Recall 3  Language- name 2 objects 0  Language- repeat 1  Language- follow 3 step command 3  Language- read & follow direction 0  Write a sentence 0  Copy design 0  Total score 20     PLEASE NOTE: A Mini-Cog screen was completed. Maximum score is 20. A value of 0 denotes this part of Folstein MMSE was not completed or the patient failed this part of the Mini-Cog screening.   Mini-Cog Screening Orientation to Time - Max 5 pts Orientation to Place - Max 5 pts Registration - Max 3 pts Recall - Max 3 pts Language Repeat - Max 1 pts Language Follow 3 Step Command - Max 3 pts     Screening Tests Health Maintenance  Topic Date Due  . Hepatitis C Screening  08/05/2020 (Originally 01/31/1951)  . MAMMOGRAM  09/27/2018  . PNA vac Low Risk Adult (2 of 2 - PPSV23) 10/16/2018  . TETANUS/TDAP  06/11/2020  . COLONOSCOPY  12/09/2022  . INFLUENZA VACCINE  Completed  . DEXA SCAN  Completed     Plan:     I have personally reviewed, addressed, and noted the following in the patient's chart:  A. Medical and social history B. Use of alcohol, tobacco or illicit drugs  C. Current medications and supplements D. Functional ability and status E.  Nutritional status F.  Physical activity G. Advance directives H. List of other physicians I.  Hospitalizations, surgeries, and ER visits in previous 12 months J.  [place] to include hearing, vision, cognitive, depression L. Referrals and appointments - none  In addition, I have reviewed and discussed with patient certain preventive protocols, quality metrics, and best practice recommendations. A written personalized care plan for preventive services as well as general preventive health  recommendations were provided to  patient.  See attached scanned questionnaire for additional information.   Signed,   Lindell Noe, MHA, BS, LPN Health Coach

## 2018-08-15 ENCOUNTER — Ambulatory Visit (INDEPENDENT_AMBULATORY_CARE_PROVIDER_SITE_OTHER): Payer: Medicare HMO | Admitting: Family Medicine

## 2018-08-15 ENCOUNTER — Encounter: Payer: Self-pay | Admitting: Family Medicine

## 2018-08-15 VITALS — BP 134/86 | HR 70 | Temp 98.0°F | Ht 64.5 in | Wt 108.0 lb

## 2018-08-15 DIAGNOSIS — J449 Chronic obstructive pulmonary disease, unspecified: Secondary | ICD-10-CM | POA: Diagnosis not present

## 2018-08-15 DIAGNOSIS — K51919 Ulcerative colitis, unspecified with unspecified complications: Secondary | ICD-10-CM | POA: Diagnosis not present

## 2018-08-15 DIAGNOSIS — M359 Systemic involvement of connective tissue, unspecified: Secondary | ICD-10-CM

## 2018-08-15 DIAGNOSIS — M810 Age-related osteoporosis without current pathological fracture: Secondary | ICD-10-CM | POA: Diagnosis not present

## 2018-08-15 DIAGNOSIS — Z23 Encounter for immunization: Secondary | ICD-10-CM | POA: Diagnosis not present

## 2018-08-15 DIAGNOSIS — Z0000 Encounter for general adult medical examination without abnormal findings: Secondary | ICD-10-CM | POA: Diagnosis not present

## 2018-08-15 DIAGNOSIS — K5100 Ulcerative (chronic) pancolitis without complications: Secondary | ICD-10-CM

## 2018-08-15 DIAGNOSIS — R636 Underweight: Secondary | ICD-10-CM

## 2018-08-15 DIAGNOSIS — I10 Essential (primary) hypertension: Secondary | ICD-10-CM

## 2018-08-15 DIAGNOSIS — I272 Pulmonary hypertension, unspecified: Secondary | ICD-10-CM

## 2018-08-15 MED ORDER — AMLODIPINE BESYLATE 5 MG PO TABS: 5.0000 mg | ORAL_TABLET | Freq: Every day | ORAL | 3 refills | Status: DC

## 2018-08-15 MED ORDER — HYDROCHLOROTHIAZIDE 25 MG PO TABS: 25.0000 mg | ORAL_TABLET | Freq: Every day | ORAL | 3 refills | Status: DC

## 2018-08-15 NOTE — Assessment & Plan Note (Signed)
Rev dexa 1/18  She is not ready to schedule next one  No falls or fx  Underweight  Disc exercise and supplements D level is high-will cut D dose to 3000 iu

## 2018-08-15 NOTE — Assessment & Plan Note (Signed)
With chronic pain and nodules  Continues rheumatology f/u and biologic medicines Able to work and complete ADLs

## 2018-08-15 NOTE — Assessment & Plan Note (Signed)
Again stressed imp of protein/calorie increase Difficult in the setting of severe copd and auto immune joint dz Will try ensure with meals  More protein (peanut butter) Snacks

## 2018-08-15 NOTE — Patient Instructions (Addendum)
Don't forget to schedule your mammogram   Pneumonia vaccine today   Cut your vitamin D from 5000 iu daily to 3000 iu daily (you are getting a little too much)   If you are interested in the new shingles vaccine (Shingrix) - call your local pharmacy to check on coverage and availability  If affordable, get on a wait list at your pharmacy to get the vaccine. Check with your rheumatologist to make sure you can get it   Your potassium was slightly low  Bananas /oranges/leafy greens/cantelope  Continue the multi vitamin   Pneumonia vaccine today

## 2018-08-15 NOTE — Progress Notes (Signed)
Subjective:    Patient ID: Carrie Hendricks, female    DOB: 1950/11/16, 68 y.o.   MRN: [id number]  HPI  Here for health maintenance exam and to review chronic medical problems    Has been working-that is good!  Likes to keep busy  Has enough time to take care of herself   Wt Readings from Last 3 Encounters:  08/15/18 108 lb (49 kg)  08/14/18 108 lb (49 kg)  01/31/18 110 lb 6.4 oz (50.1 kg)  GI issues  3 meals with snacks in between  Also some ensure  18.25 kg/m   amw was yesterday  Missed low tone in L ear -hearing screen (she does not notice) No gaps or concerns   Mammogram 3/19 - she will schedule her own mammogram/got her reminder  Self breast exam -non lumps  Sister was diagnosed with breast cancer   Had a skin cancer removed from her back  (not melanoma)  Goes to derm    prevnar 12/17  Due for PNA 23 (has been 5 y)  Colonoscopy 6/19 5 y recall  Has UC Doing fairly well with colitis   dexa 1/18 OP- wants to hold off on next one  No falls  No fractures  Taking ca and D No regular exercise due to chronic back pain  Plans to start walking more  Vit D level is high at 100.4  CT disease Dr Precious Reel has to retire -will change to a newer doctor   Zoster status - has not had   bp is stable today  No cp or palpitations or headaches or edema  No side effects to medicines  BP Readings from Last 3 Encounters:  08/15/18 134/86  08/14/18 102/80  01/31/18 138/82     H/o pulmonary HTN   Also copd-sees Dr Alva Garnet  Is sob on exertion  Needs to start using her rescue inhaler before exertion  Otherwise stable   Lab Results  Component Value Date   WBC 5.1 08/14/2018   HGB 15.9 (H) 08/14/2018   HCT 46.0 08/14/2018   MCV 97.3 08/14/2018   PLT 191.0 08/14/2018    Lab Results  Component Value Date   CREATININE 0.80 08/14/2018   BUN 13 08/14/2018   NA 137 08/14/2018   K 3.4 (L) 08/14/2018   CL 97 08/14/2018   CO2 31 08/14/2018   Glucose was 106-  non fasting   Cholesterol  Lab Results  Component Value Date   CHOL 186 08/14/2018   CHOL 197 05/25/2016   CHOL 177 05/15/2015   Lab Results  Component Value Date   HDL 63.30 08/14/2018   HDL 64.30 05/25/2016   HDL 70 05/15/2015   Lab Results  Component Value Date   LDLCALC 105 (H) 08/14/2018   LDLCALC 118 (H) 05/25/2016   LDLCALC 93 05/15/2015   Lab Results  Component Value Date   TRIG 91.0 08/14/2018   TRIG 76.0 05/25/2016   TRIG 69 05/15/2015   Lab Results  Component Value Date   CHOLHDL 3 08/14/2018   CHOLHDL 3 05/25/2016   CHOLHDL 2.5 05/15/2015   No results found for: LDLDIRECT  LDL is down  Eating healthy   Lab Results  Component Value Date   TSH 1.59 08/14/2018     Patient Active Problem List   Diagnosis Date Noted  . COPD, very severe ([place]) 08/15/2018  . Sleep disorder 10/10/2017  . Screening mammogram, encounter for 09/12/2017  . Ulcerative colitis ([place]) 02/01/2017  .  Lumbar degenerative disc disease 01/20/2017  . Scoliosis 06/28/2016  . Welcome to Medicare preventive visit 06/10/2016  . Estrogen deficiency 06/10/2016  . Constipation 03/02/2016  . Underweight 03/02/2016  . Shortness of breath 05/16/2014  . History of COPD 05/16/2014  . Stress reaction 05/16/2014  . Connective tissue disease ([place]) 03/05/2014  . Pedal edema 10/15/2013  . Pulmonary hypertension ([place]) 10/10/2011  . Encounter for routine gynecological examination 07/11/2011  . Routine general medical examination at a health care facility 07/03/2011  . HEMORRHOIDS, INTERNAL, WITH BLEEDING 03/15/2010  . Essential hypertension 11/16/2006  . Rheumatoid arthritis(714.0) 11/16/2006  . Osteoporosis 11/16/2006   Past Medical History:  Diagnosis Date  . Arthritis    rheumatoid  . Complication of anesthesia   . Hypertension   . Osteoporosis   . PONV (postoperative nausea and vomiting)   . Pulmonary hypertension ([place])   . Rheumatoid arthritis(714.0)    ? scleroderma   Past  Surgical History:  Procedure Laterality Date  . colonosc neg  11/08  . COLONOSCOPY WITH PROPOFOL N/A 09/30/2016   Procedure: COLONOSCOPY WITH PROPOFOL;  Surgeon: Jonathon Bellows, MD;  Location: [facility] ENDOSCOPY;  Service: Endoscopy;  Laterality: N/A;  . COLONOSCOPY WITH PROPOFOL N/A 12/08/2017   Procedure: COLONOSCOPY WITH PROPOFOL;  Surgeon: Jonathon Bellows, MD;  Location: [facility] ENDOSCOPY;  Service: Gastroenterology;  Laterality: N/A;  . Dexa  07/2005   OP hip- 3.0  . ESOPHAGOGASTRODUODENOSCOPY (EGD) WITH PROPOFOL N/A 07/01/2016   Procedure: ESOPHAGOGASTRODUODENOSCOPY (EGD) WITH PROPOFOL;  Surgeon: Jonathon Bellows, MD;  Location: [facility] ENDOSCOPY;  Service: Endoscopy;  Laterality: N/A;  . FLEXIBLE SIGMOIDOSCOPY N/A 07/01/2016   Procedure: FLEXIBLE SIGMOIDOSCOPY;  Surgeon: Jonathon Bellows, MD;  Location: [facility] ENDOSCOPY;  Service: Endoscopy;  Laterality: N/A;  . LEEP  2005  . Surgery- tendon release in hand  2006  . TUBAL LIGATION     Social History   Tobacco Use  . Smoking status: Former Smoker    Last attempt to quit: 2006    Years since quitting: 14.1  . Smokeless tobacco: Never Used  . Tobacco comment: Quit in 8/07  Substance Use Topics  . Alcohol use: No    Alcohol/week: 0.0 standard drinks  . Drug use: No   Family History  Problem Relation Age of Onset  . Heart disease Mother   . Diabetes Mother   . Hypertension Father   . Macular degeneration Father   . Dementia Father        early  . Arthritis Father        RA  . Alcohol abuse Father   . Breast cancer Sister 74   No Known Allergies Current Outpatient Medications on File Prior to Visit  Medication Sig Dispense Refill  . albuterol (PROVENTIL HFA;VENTOLIN HFA) 108 (90 Base) MCG/ACT inhaler Inhale 2 puffs into the lungs every 6 (six) hours as needed for wheezing or shortness of breath. 1 Inhaler 10  . Aspirin (ADULT ASPIRIN LOW STRENGTH) 81 MG EC tablet Take 81 mg by mouth daily.      . Cholecalciferol (VITAMIN D3) 2000 UNITS capsule Take  2,000 Units by mouth daily.      Marland Kitchen etanercept (ENBREL SURECLICK) 50 MG/ML injection Inject 50 mg into the skin once a week.    . hydroxychloroquine (PLAQUENIL) 200 MG tablet Take 200 mg by mouth daily.    . mesalamine (APRISO) 0.375 g 24 hr capsule Take 1 capsule (0.375 g total) by mouth daily. 90 capsule 0  . Multiple Vitamin (MULTIVITAMIN) capsule Take 1  capsule by mouth daily.    . Tiotropium Bromide-Olodaterol (STIOLTO RESPIMAT) 2.5-2.5 MCG/ACT AERS Inhale 2 puffs into the lungs daily. 3 Inhaler 5   No current facility-administered medications on file prior to visit.     Review of Systems  Constitutional: Positive for fatigue. Negative for activity change, appetite change, fever and unexpected weight change.  HENT: Negative for congestion, ear pain, rhinorrhea, sinus pressure and sore throat.   Eyes: Negative for pain, redness and visual disturbance.  Respiratory: Negative for cough, shortness of breath and wheezing.   Cardiovascular: Negative for chest pain and palpitations.  Gastrointestinal: Negative for abdominal pain, blood in stool, constipation and diarrhea.  Endocrine: Negative for polydipsia and polyuria.  Genitourinary: Negative for dysuria, frequency and urgency.  Musculoskeletal: Positive for arthralgias, back pain, joint swelling and neck pain. Negative for myalgias.       Nodules  Large one on R wrist  Skin: Negative for pallor and rash.  Allergic/Immunologic: Negative for environmental allergies.  Neurological: Negative for dizziness, syncope and headaches.  Hematological: Negative for adenopathy. Does not bruise/bleed easily.  Psychiatric/Behavioral: Negative for decreased concentration and dysphoric mood. The patient is not nervous/anxious.        Objective:   Physical Exam Constitutional:      General: She is not in acute distress.    Appearance: Normal appearance. She is well-developed.     Comments: Underweight and somewhat frail appearing   HENT:     Head:  Normocephalic and atraumatic.     Right Ear: Tympanic membrane, ear canal and external ear normal.     Left Ear: Tympanic membrane, ear canal and external ear normal.     Nose: Nose normal.     Mouth/Throat:     Mouth: Mucous membranes are moist.     Pharynx: Oropharynx is clear.  Eyes:     General: No scleral icterus.       Right eye: No discharge.        Left eye: No discharge.     Conjunctiva/sclera: Conjunctivae normal.     Pupils: Pupils are equal, round, and reactive to light.  Neck:     Musculoskeletal: Normal range of motion and neck supple. No neck rigidity.     Thyroid: No thyromegaly.     Vascular: No carotid bruit or JVD.  Cardiovascular:     Rate and Rhythm: Normal rate and regular rhythm.     Pulses: Normal pulses.     Heart sounds: Normal heart sounds. No gallop.   Pulmonary:     Effort: Pulmonary effort is normal. No respiratory distress.     Breath sounds: Wheezing present. No rales.     Comments: Diffusely distant bs Scattered end exp wheeze Fair air exch Chest:     Chest wall: No tenderness.  Abdominal:     General: Bowel sounds are normal. There is no distension or abdominal bruit.     Palpations: Abdomen is soft. There is no mass.     Tenderness: There is no abdominal tenderness.  Genitourinary:    Comments: Breast exam: No mass, nodules, thickening, tenderness, bulging, retraction, inflamation, nipple discharge or skin changes noted.  No axillary or clavicular LA.     Musculoskeletal: Normal range of motion.        General: No tenderness.     Right lower leg: No edema.     Left lower leg: No edema.  Lymphadenopathy:     Cervical: No cervical adenopathy.  Skin:    General:  Skin is warm and dry.     Capillary Refill: Capillary refill takes less than 2 seconds.     Coloration: Skin is not pale.     Findings: No erythema or rash.     Comments: Solar lentigines diffusely Brown nevi on back  Neurological:     General: No focal deficit present.      Mental Status: She is alert.     Cranial Nerves: No cranial nerve deficit.     Motor: No abnormal muscle tone.     Coordination: Coordination normal.     Deep Tendon Reflexes: Reflexes are normal and symmetric. Reflexes normal.  Psychiatric:        Mood and Affect: Mood normal.           Assessment & Plan:   Problem List Items Addressed This Visit      Cardiovascular and Mediastinum   Essential hypertension    bp in fair control at this time  BP Readings from Last 1 Encounters:  08/15/18 134/86   No changes needed Most recent labs reviewed  Disc lifstyle change with low sodium diet and exercise  K of 3.4 -disc inc potassium in diet       Relevant Medications   hydrochlorothiazide (HYDRODIURIL) 25 MG tablet   amLODipine (NORVASC) 5 MG tablet   Pulmonary hypertension (HCC)    Doing well  No clinical changes       Relevant Medications   hydrochlorothiazide (HYDRODIURIL) 25 MG tablet   amLODipine (NORVASC) 5 MG tablet     Respiratory   COPD, very severe ([place])    In prior smoker Continues f/u with pulmonary  Disc imp of wt gain (supplementing more calories and protein) Continues current medicines  PNA 23 vaccine today        Digestive   Ulcerative colitis ([place])    Per pt occ flares if she eats the wrong things Colonoscopy 6/19 with 5 y recall       RESOLVED: Colitis, chronic, ulcerative, unspecified complication ([place])     Musculoskeletal and Integument   Osteoporosis    Rev dexa 1/18  She is not ready to schedule next one  No falls or fx  Underweight  Disc exercise and supplements D level is high-will cut D dose to 3000 iu        Other   Routine general medical examination at a health care facility - Primary    Reviewed health habits including diet and exercise and skin cancer prevention Reviewed appropriate screening tests for age  Also reviewed health mt list, fam hx and immunization status , as well as social and family history   See HPI amw  rev Labs rev  Disc need for more protein calories (may try to add 3 ensure a day since she likes them)  Pt declined dexa this year Will schedule her own mammogram  PNA 23 vaccine today  Self care encouraged        Underweight    Again stressed imp of protein/calorie increase Difficult in the setting of severe copd and auto immune joint dz Will try ensure with meals  More protein (peanut butter) Snacks       Connective tissue disease ([place])    With chronic pain and nodules  Continues rheumatology f/u and biologic medicines Able to work and complete ADLs       Other Visit Diagnoses    Need for 23-polyvalent pneumococcal polysaccharide vaccine       Relevant Orders  Pneumococcal polysaccharide vaccine 23-valent greater than or equal to 2yo subcutaneous/IM (Completed)

## 2018-08-15 NOTE — Assessment & Plan Note (Signed)
Reviewed health habits including diet and exercise and skin cancer prevention Reviewed appropriate screening tests for age  Also reviewed health mt list, fam hx and immunization status , as well as social and family history   See HPI amw rev Labs rev  Disc need for more protein calories (may try to add 3 ensure a day since she likes them)  Pt declined dexa this year Will schedule her own mammogram  PNA 23 vaccine today  Self care encouraged

## 2018-08-15 NOTE — Assessment & Plan Note (Signed)
In prior smoker Continues f/u with pulmonary  Disc imp of wt gain (supplementing more calories and protein) Continues current medicines  PNA 23 vaccine today

## 2018-08-15 NOTE — Assessment & Plan Note (Signed)
bp in fair control at this time  BP Readings from Last 1 Encounters:  08/15/18 134/86   No changes needed Most recent labs reviewed  Disc lifstyle change with low sodium diet and exercise  K of 3.4 -disc inc potassium in diet

## 2018-08-15 NOTE — Assessment & Plan Note (Signed)
Doing well  No clinical changes

## 2018-08-15 NOTE — Assessment & Plan Note (Signed)
Per pt occ flares if she eats the wrong things Colonoscopy 6/19 with 5 y recall

## 2018-10-03 ENCOUNTER — Telehealth: Payer: Self-pay | Admitting: Gastroenterology

## 2018-10-03 NOTE — Telephone Encounter (Signed)
Pt need to know if she needs to continue rx Mesalamine .375gm ER  Also needs refill but she would like a recommendation  for a cheaper rx because this one cost her $200 please call pt

## 2018-10-04 NOTE — Telephone Encounter (Signed)
Jadijah inform yes she needs to continue- the present time would not be the time to stop, a flare could make her very sick. We can talk about it after the [place] resolves. Can we find ouit from pharmacy which other similar compound would be cheaper?

## 2018-10-09 ENCOUNTER — Other Ambulatory Visit: Payer: Self-pay

## 2018-10-09 MED ORDER — BALSALAZIDE DISODIUM 750 MG PO CAPS: 1500.0000 mg | ORAL_CAPSULE | Freq: Two times a day (BID) | ORAL | 1 refills | Status: DC

## 2018-10-11 ENCOUNTER — Ambulatory Visit: Payer: Medicare HMO | Admitting: Gastroenterology

## 2018-10-15 ENCOUNTER — Telehealth: Payer: Self-pay | Admitting: Gastroenterology

## 2018-10-15 NOTE — Telephone Encounter (Signed)
Patient has called in asking for a prescription for Mesalamine 0.375g [facility] [place]. She has been using [facility] they are to expensive. There was another med (Balsalazide 765m) but is was still to expensive. She currently has a good rx card to be [facility] at [facility] & can get the Mesalamine 0.375g for $123.95.

## 2018-10-16 ENCOUNTER — Other Ambulatory Visit: Payer: Self-pay

## 2018-10-16 MED ORDER — MESALAMINE ER 0.375 G PO CP24: 375.0000 mg | ORAL_CAPSULE | Freq: Every day | ORAL | 1 refills | Status: DC

## 2018-10-16 NOTE — Telephone Encounter (Signed)
Requested prescription has been sent to pt preferred pharmacy and pt has been notified.

## 2018-10-26 ENCOUNTER — Ambulatory Visit: Payer: Medicare HMO | Admitting: Pulmonary Disease

## 2018-12-21 DIAGNOSIS — D2261 Melanocytic nevi of right upper limb, including shoulder: Secondary | ICD-10-CM | POA: Diagnosis not present

## 2018-12-21 DIAGNOSIS — D2271 Melanocytic nevi of right lower limb, including hip: Secondary | ICD-10-CM | POA: Diagnosis not present

## 2018-12-21 DIAGNOSIS — Z85828 Personal history of other malignant neoplasm of skin: Secondary | ICD-10-CM | POA: Diagnosis not present

## 2018-12-21 DIAGNOSIS — L821 Other seborrheic keratosis: Secondary | ICD-10-CM | POA: Diagnosis not present

## 2018-12-21 DIAGNOSIS — Z08 Encounter for follow-up examination after completed treatment for malignant neoplasm: Secondary | ICD-10-CM | POA: Diagnosis not present

## 2018-12-21 DIAGNOSIS — D225 Melanocytic nevi of trunk: Secondary | ICD-10-CM | POA: Diagnosis not present

## 2018-12-21 DIAGNOSIS — D2262 Melanocytic nevi of left upper limb, including shoulder: Secondary | ICD-10-CM | POA: Diagnosis not present

## 2018-12-21 DIAGNOSIS — D2272 Melanocytic nevi of left lower limb, including hip: Secondary | ICD-10-CM | POA: Diagnosis not present

## 2018-12-25 ENCOUNTER — Other Ambulatory Visit: Payer: Self-pay | Admitting: Pulmonary Disease

## 2018-12-25 MED ORDER — ALBUTEROL SULFATE HFA 108 (90 BASE) MCG/ACT IN AERS: 2.0000 | INHALATION_SPRAY | Freq: Four times a day (QID) | RESPIRATORY_TRACT | 1 refills | Status: DC | PRN

## 2018-12-26 ENCOUNTER — Other Ambulatory Visit: Payer: Self-pay | Admitting: Pulmonary Disease

## 2019-01-02 ENCOUNTER — Other Ambulatory Visit: Payer: Self-pay | Admitting: Family Medicine

## 2019-01-02 DIAGNOSIS — Z1231 Encounter for screening mammogram for malignant neoplasm of breast: Secondary | ICD-10-CM

## 2019-01-16 ENCOUNTER — Other Ambulatory Visit: Payer: Self-pay

## 2019-01-16 ENCOUNTER — Ambulatory Visit: Payer: Medicare HMO | Admitting: Pulmonary Disease

## 2019-01-16 MED ORDER — MESALAMINE ER 0.375 G PO CP24: 375.0000 mg | ORAL_CAPSULE | Freq: Every day | ORAL | 3 refills | Status: DC

## 2019-01-16 NOTE — Telephone Encounter (Signed)
Spoke with pt to verify what pharmacy she'd like the Mesalamine prescription sent to. Pt states she wants to contact her preferred pharmacies to compare pricing first. Pt states she'll contact our office with her decision.

## 2019-01-16 NOTE — Telephone Encounter (Signed)
Pt called back and informed me that she'd like the [facility] [place] has been sent.

## 2019-01-18 ENCOUNTER — Ambulatory Visit: Payer: Medicare HMO | Admitting: Pulmonary Disease

## 2019-01-28 ENCOUNTER — Telehealth: Payer: Self-pay | Admitting: Pulmonary Disease

## 2019-01-28 NOTE — Telephone Encounter (Signed)

## 2019-01-29 ENCOUNTER — Encounter: Payer: Self-pay | Admitting: Pulmonary Disease

## 2019-01-29 ENCOUNTER — Other Ambulatory Visit: Payer: Self-pay

## 2019-01-29 ENCOUNTER — Ambulatory Visit: Payer: Medicare HMO | Admitting: Pulmonary Disease

## 2019-01-29 VITALS — BP 130/80 | HR 88 | Temp 98.0°F | Ht 66.0 in | Wt 108.2 lb

## 2019-01-29 DIAGNOSIS — J432 Centrilobular emphysema: Secondary | ICD-10-CM

## 2019-01-29 DIAGNOSIS — J449 Chronic obstructive pulmonary disease, unspecified: Secondary | ICD-10-CM

## 2019-01-29 DIAGNOSIS — J9611 Chronic respiratory failure with hypoxia: Secondary | ICD-10-CM | POA: Diagnosis not present

## 2019-01-29 MED ORDER — STIOLTO RESPIMAT 2.5-2.5 MCG/ACT IN AERS: 2.0000 | INHALATION_SPRAY | Freq: Every day | RESPIRATORY_TRACT | 5 refills | Status: DC

## 2019-01-29 MED ORDER — ALBUTEROL SULFATE HFA 108 (90 BASE) MCG/ACT IN AERS: 2.0000 | INHALATION_SPRAY | Freq: Four times a day (QID) | RESPIRATORY_TRACT | 1 refills | Status: DC | PRN

## 2019-01-29 NOTE — Patient Instructions (Signed)
Continue Stiolto inhaler, 2 inhalations daily Continue albuterol inhaler as needed for increased shortness of breath, wheezing, chest tightness, cough  To assess your possible need for oxygen therapy the following tests have been ordered:  1) 6-minute walk test 2) overnight oximetry  We will contact you with results of the above tests  Follow-up in 1 year.  Call sooner if needed

## 2019-01-29 NOTE — Progress Notes (Signed)
PULMONARY OFFICE FOLLOW-UP NOTE  Requesting MD/Service: Tower Date of initial consultation: 02/17/17 Reason for consultation: DOE, COPD  PT PROFILE: 68 y.o. female former smoker referred for evaluation of progressive DOE and history of COPD  DATA: 04/04/17 CXR: Severe hyperinflation and hyperlucency without acute findings 05/18/17 PFTs: Very severe obstruction, FEV1 0.96 L (38%), hyperinflation-TLC 6.92 L (127%), RV 4.45 L (208%), DLCO 4.8 (28%), DLCO/VA 1.35 (37%)  INTERVAL: Last encounter 10/02/2017.  No major pulmonary events in the interim.   SUBJ:  Patient is here mostly to get refills of her medications.  Despite her very severe COPD as documented by PFTs above, she remains only modestly limited with class II dyspnea which has not changed since prior evaluation.  She walks regularly.  She sometimes uses albuterol prior to exertion.  Other than that, she rarely uses albuterol.  She remains on Stiolto daily.  She denies cough, fever, chest pain, orthopnea, paroxysmal nocturnal dyspnea, hemoptysis, lower extremity edema, calf tenderness.   Vitals:   01/29/19 1457  BP: 130/80  Pulse: 88  Temp: 98 F (36.7 C)  TempSrc: Oral  SpO2: 90%  Weight: 108 lb 3.2 oz (49.1 kg)  Height: 5' 6"  (1.676 m)  RA   EXAM:  Gen: Thin but not cachectic, NAD HEENT: NCAT, sclerae white Neck: No JVD Thorax: Increased AP diameter Lungs: breath sounds diminished without wheezes or other adventitious sounds Cardiovascular: RRR, no murmurs Abdomen: Soft, nontender, normal BS Ext: without clubbing, cyanosis.  Trace symmetric pedal edema Neuro: grossly intact Skin: Limited exam, no lesions noted   DATA:   BMP Latest Ref Rng & Units 08/14/2018 04/24/2018 10/24/2017  Glucose 70 - 99 mg/dL 106(H) 86 96  BUN 6 - 23 mg/dL 13 11 12   Creatinine 0.40 - 1.20 mg/dL 0.80 0.81 0.71  Sodium 135 - 145 mEq/L 137 139 139  Potassium 3.5 - 5.1 mEq/L 3.4(L) 3.4(L) 3.5  Chloride 96 - 112 mEq/L 97 100 100(L)  CO2  19 - 32 mEq/L 31 33(H) 31  Calcium 8.4 - 10.5 mg/dL 9.8 9.6 9.8    CBC Latest Ref Rng & Units 08/14/2018 04/24/2018 10/24/2017  WBC 4.0 - 10.5 K/uL 5.1 4.3 4.8  Hemoglobin 12.0 - 15.0 g/dL 15.9(H) 15.2(H) 15.6  Hematocrit 36.0 - 46.0 % 46.0 43.1 45.7  Platelets 150.0 - 400.0 K/uL 191.0 173 181    CXR: No recent film  IMPRESSION:     ICD-10-CM   1. COPD, very severe (HCC)  J44.9 6 minute walk    Pulse oximetry, overnight  2. Centrilobular emphysema ([place])  J43.2   3. Chronic hypoxemia, borderline need for long-term oxygen therapy  J96.11     She remains very well compensated and seems to continue to benefited from [employer] inhaler  We discussed the potential need for home oxygen therapy (given resting SPO2 of 90%) either now or in the near future.  We discussed the potential role for LDCT lung cancer screening which she has declined  PLAN:  Continue Stiolto inhaler, 2 inhalations daily Continue albuterol inhaler as needed for increased shortness of breath, wheezing, chest tightness, cough  To assess possible need for oxygen therapy the following tests have been ordered:  1) 6-minute walk test 2) overnight oximetry  We will contact her with results of the above tests and initiate oxygen therapy as indicated  Follow-up in 1 year with any provider.  Call sooner if needed  Merton Border, MD PCCM service Mobile [phone] Pager [phone] 01/31/2019 1:31 PM

## 2019-02-07 ENCOUNTER — Ambulatory Visit
Admission: RE | Admit: 2019-02-07 | Discharge: 2019-02-07 | Disposition: A | Discharge status: Home / Self Care | Payer: Medicare HMO | Source: Ambulatory Visit | Attending: Family Medicine | Admitting: Family Medicine

## 2019-02-07 DIAGNOSIS — I7300 Raynaud's syndrome without gangrene: Secondary | ICD-10-CM | POA: Diagnosis not present

## 2019-02-07 DIAGNOSIS — M359 Systemic involvement of connective tissue, unspecified: Secondary | ICD-10-CM | POA: Diagnosis not present

## 2019-02-07 DIAGNOSIS — K51919 Ulcerative colitis, unspecified with unspecified complications: Secondary | ICD-10-CM | POA: Diagnosis not present

## 2019-02-07 DIAGNOSIS — Z79899 Other long term (current) drug therapy: Secondary | ICD-10-CM | POA: Diagnosis not present

## 2019-02-07 DIAGNOSIS — M810 Age-related osteoporosis without current pathological fracture: Secondary | ICD-10-CM | POA: Diagnosis not present

## 2019-02-07 DIAGNOSIS — Z1231 Encounter for screening mammogram for malignant neoplasm of breast: Secondary | ICD-10-CM | POA: Diagnosis not present

## 2019-02-07 DIAGNOSIS — M48062 Spinal stenosis, lumbar region with neurogenic claudication: Secondary | ICD-10-CM | POA: Diagnosis not present

## 2019-02-07 DIAGNOSIS — M0579 Rheumatoid arthritis with rheumatoid factor of multiple sites without organ or systems involvement: Secondary | ICD-10-CM | POA: Diagnosis not present

## 2019-02-19 ENCOUNTER — Other Ambulatory Visit: Payer: Self-pay

## 2019-02-19 ENCOUNTER — Ambulatory Visit: Payer: Medicare HMO | Admitting: Gastroenterology

## 2019-02-19 VITALS — BP 152/89 | HR 90 | Temp 99.4°F | Ht 66.0 in | Wt 107.8 lb

## 2019-02-19 DIAGNOSIS — K5190 Ulcerative colitis, unspecified, without complications: Secondary | ICD-10-CM | POA: Diagnosis not present

## 2019-02-19 NOTE — Progress Notes (Signed)
Jonathon Bellows MD, MRCP(U.K) [address]  [place]  [place], [place] [address]  Main: [phone]  Fax: [phone]   Primary Care Physician: Tower, Wynelle Fanny, MD  Primary Gastroenterologist:  Dr. Jonathon Bellows   Follow-up for ulcerative colitis  [name] is a 68 y.o. female   Summary of history :  She has had abdominal pain and blood in her stools since 2011.At her initial visit she was on large doses of long term Advil. EGD 06/2016: biopsies showed active chronic gastritis with H pylori , marked duodenitis , normal esophageal biopsies.[place] year and a half she had abdominal pains, passing "pus and blood in bowel movements".  Colonoscopy 06/2016 : unable to perform colonoscopy as she had severe colitis on the left side of the colon - we had to abort the procedure at the 30 cm mark.Biopsies confirmed Active colitis with chronicity  H pylori stool antigen is negative 08/16/16   Colonoscopy was performed on 3/3/0/18 and bx of the terminal ileum was normal but biopsies of the ascending colon , transverse , descending, sigmoid and rectum showed some areas of inflammation and areas with chronic inflammation changes.  02/06/2017 immune to hepatitis A/B, TB QuantiFERON negative, HIV test negative, vitamin D levels were normalHad DEXA scan in 07/2016   Colonoscopy 12/2017 -single adenoma resected . Colon bx throughout the colon showed no active colitis.    Interval history 01/31/18-02/19/2019 She is doing well since her last visit she has 1 bowel movement a day no blood in the stools takes mesalamine daily.  She says that she has been suffering from a bit of constipation has crampy left lower quadrant abdominal pain relieved after bowel movement.  MiraLAX helps when she takes it.   Current Outpatient Medications  Medication Sig Dispense Refill  . albuterol (VENTOLIN HFA) 108 (90 Base) MCG/ACT inhaler Inhale 2 puffs into the lungs every 6 (six) hours as needed for  wheezing or shortness of breath. 54 g 1  . amLODipine (NORVASC) 5 MG tablet Take 1 tablet (5 mg total) by mouth daily. 90 tablet 3  . Aspirin (ADULT ASPIRIN LOW STRENGTH) 81 MG EC tablet Take 81 mg by mouth daily.      Marland Kitchen etanercept (ENBREL SURECLICK) 50 MG/ML injection Inject 50 mg into the skin once a week.    . hydrochlorothiazide (HYDRODIURIL) 25 MG tablet Take 1 tablet (25 mg total) by mouth daily. 90 tablet 3  . hydroxychloroquine (PLAQUENIL) 200 MG tablet Take 200 mg by mouth daily.    . mesalamine (APRISO) 0.375 g 24 hr capsule Take 1 capsule (0.375 g total) by mouth daily. 90 capsule 3  . Multiple Vitamin (MULTIVITAMIN) capsule Take 1 capsule by mouth daily.    . Tiotropium Bromide-Olodaterol (STIOLTO RESPIMAT) 2.5-2.5 MCG/ACT AERS Inhale 2 puffs into the lungs daily. 12 g 5   No current facility-administered medications for this visit.     Allergies as of 02/19/2019  . (No Known Allergies)    ROS:  General: Negative for anorexia, weight loss, fever, chills, fatigue, weakness. ENT: Negative for hoarseness, difficulty swallowing , nasal congestion. CV: Negative for chest pain, angina, palpitations, dyspnea on exertion, peripheral edema.  Respiratory: Negative for dyspnea at rest, dyspnea on exertion, cough, sputum, wheezing.  GI: See history of present illness. GU:  Negative for dysuria, hematuria, urinary incontinence, urinary frequency, nocturnal urination.  Endo: Negative for unusual weight change.    Physical Examination:   There were no vitals taken for this visit.  General: Well-nourished, well-developed in no acute distress.  Eyes: No icterus. Conjunctivae pink. Mouth: Oropharyngeal mucosa moist and pink , no lesions erythema or exudate. Lungs: Clear to auscultation bilaterally. Non-labored. Heart: Regular rate and rhythm, no murmurs rubs or gallops.  Abdomen: Bowel sounds are normal, nontender, nondistended, no hepatosplenomegaly or masses, no abdominal bruits or  hernia , no rebound or guarding.   Extremities: No lower extremity edema. No clubbing or deformities. Neuro: Alert and oriented x 3.  Grossly intact. Skin: Warm and dry, no jaundice.   Psych: Alert and cooperative, normal mood and affect.   Imaging Studies: Mm 3d Screen Breast Bilateral  Result Date: 02/07/2019 CLINICAL DATA:  Screening. EXAM: DIGITAL SCREENING BILATERAL MAMMOGRAM WITH TOMO AND CAD COMPARISON:  Previous exam(s). ACR Breast Density Category d: The breast tissue is extremely dense, which lowers the sensitivity of mammography FINDINGS: There are no findings suspicious for malignancy. Images were processed with CAD. IMPRESSION: No mammographic evidence of malignancy. A result letter of this screening mammogram will be mailed directly to the patient. RECOMMENDATION: Screening mammogram in one year. (Code:SM-B-01Y) BI-RADS CATEGORY  1: Negative. Electronically Signed   By: Evangeline Dakin M.D.   On: 02/07/2019 17:35    Assessment and Plan:   Carrie Hendricks is a 68 y.o. y/o female here to follow up forUlcerativecolitis:. Doing well on Aprisio one tablet daily.Clinically, endoscopically and biochemical  in remission. Immune to hepatitis A and B  Labs  1. CBC,CMP,CRP, vitamin D 2.  Suggest to take half a capful of MiraLAX daily she probably has an element of irritable bowel with constipation.  Dr Jonathon Bellows  MD,MRCP [facility]) Follow up in 6 to 8 months

## 2019-02-23 LAB — CBC WITH DIFFERENTIAL/PLATELET
Basophils Absolute: 0.1 10*3/uL (ref 0.0–0.2)
Basos: 1 %
EOS (ABSOLUTE): 0.1 10*3/uL (ref 0.0–0.4)
Eos: 1 %
Hematocrit: 44.9 % (ref 34.0–46.6)
Hemoglobin: 16 g/dL — ABNORMAL HIGH (ref 11.1–15.9)
Immature Grans (Abs): 0 10*3/uL (ref 0.0–0.1)
Immature Granulocytes: 0 %
Lymphocytes Absolute: 1.9 10*3/uL (ref 0.7–3.1)
Lymphs: 40 %
MCH: 33.5 pg — ABNORMAL HIGH (ref 26.6–33.0)
MCHC: 35.6 g/dL (ref 31.5–35.7)
MCV: 94 fL (ref 79–97)
Monocytes Absolute: 0.4 10*3/uL (ref 0.1–0.9)
Monocytes: 9 %
Neutrophils Absolute: 2.4 10*3/uL (ref 1.4–7.0)
Neutrophils: 49 %
Platelets: 183 10*3/uL (ref 150–450)
RBC: 4.77 x10E6/uL (ref 3.77–5.28)
RDW: 11.7 % (ref 11.7–15.4)
WBC: 4.8 10*3/uL (ref 3.4–10.8)

## 2019-02-23 LAB — COMPREHENSIVE METABOLIC PANEL
ALT: 20 IU/L (ref 0–32)
AST: 25 IU/L (ref 0–40)
Albumin/Globulin Ratio: 1.5 (ref 1.2–2.2)
Albumin: 4.6 g/dL (ref 3.8–4.8)
Alkaline Phosphatase: 55 IU/L (ref 39–117)
BUN/Creatinine Ratio: 13 (ref 12–28)
BUN: 10 mg/dL (ref 8–27)
Bilirubin Total: 0.7 mg/dL (ref 0.0–1.2)
CO2: 26 mmol/L (ref 20–29)
Calcium: 10.2 mg/dL (ref 8.7–10.3)
Chloride: 94 mmol/L — ABNORMAL LOW (ref 96–106)
Creatinine, Ser: 0.75 mg/dL (ref 0.57–1.00)
GFR calc Af Amer: 95 mL/min/{1.73_m2} (ref >59)
GFR calc non Af Amer: 82 mL/min/{1.73_m2} (ref >59)
Globulin, Total: 3 g/dL (ref 1.5–4.5)
Glucose: 102 mg/dL — ABNORMAL HIGH (ref 65–99)
Potassium: 3.7 mmol/L (ref 3.5–5.2)
Sodium: 137 mmol/L (ref 134–144)
Total Protein: 7.6 g/dL (ref 6.0–8.5)

## 2019-02-23 LAB — VITAMIN D 1,25 DIHYDROXY
Vitamin D 1, 25 (OH)2 Total: 40 pg/mL
Vitamin D2 1, 25 (OH)2: <10 pg/mL
Vitamin D3 1, 25 (OH)2: 40 pg/mL

## 2019-02-23 LAB — C-REACTIVE PROTEIN: CRP: 2 mg/L (ref 0–10)

## 2019-02-24 ENCOUNTER — Encounter: Payer: Self-pay | Admitting: Gastroenterology

## 2019-02-26 ENCOUNTER — Telehealth: Payer: Self-pay

## 2019-02-26 NOTE — Telephone Encounter (Signed)
Spoke with pt and informed her of lab results and Dr. Georgeann Oppenheim recommendations. Pt stated that she'd like Dr. Vicente Males to email her via [place] explaining what the elevated Hb could possibly indicate. She'll then decide if she'll proceed with the referral. I explained that I will inform Dr. Vicente Males of her request.

## 2019-02-26 NOTE — Telephone Encounter (Signed)
-----   Message from Jonathon Bellows, MD sent at 02/24/2019 11:37 AM EDT ----- Sherald Hess please inform patient Hb is actually is on the higer side checked x 3- may be nothing but I would like her to see Dr Tasia Catchings to find out if any further testing is needed  C/c Tower, Wynelle Fanny, MD , Dr Tasia Catchings  Dr Jonathon Bellows MD,MRCP [facility]) Gastroenterology/Hepatology Pager: [phone]

## 2019-02-27 ENCOUNTER — Telehealth: Payer: Self-pay

## 2019-02-27 DIAGNOSIS — M810 Age-related osteoporosis without current pathological fracture: Secondary | ICD-10-CM | POA: Diagnosis not present

## 2019-02-27 NOTE — Telephone Encounter (Signed)
Mych[place] message sent.

## 2019-03-08 ENCOUNTER — Ambulatory Visit (INDEPENDENT_AMBULATORY_CARE_PROVIDER_SITE_OTHER): Payer: Medicare HMO | Admitting: Family Medicine

## 2019-03-08 ENCOUNTER — Encounter: Payer: Self-pay | Admitting: Family Medicine

## 2019-03-08 ENCOUNTER — Other Ambulatory Visit: Payer: Self-pay

## 2019-03-08 VITALS — BP 131/80 | HR 91 | Temp 97.3°F | Ht 66.0 in | Wt 107.6 lb

## 2019-03-08 DIAGNOSIS — M810 Age-related osteoporosis without current pathological fracture: Secondary | ICD-10-CM

## 2019-03-08 DIAGNOSIS — D582 Other hemoglobinopathies: Secondary | ICD-10-CM

## 2019-03-08 DIAGNOSIS — I10 Essential (primary) hypertension: Secondary | ICD-10-CM | POA: Diagnosis not present

## 2019-03-08 DIAGNOSIS — J449 Chronic obstructive pulmonary disease, unspecified: Secondary | ICD-10-CM | POA: Diagnosis not present

## 2019-03-08 DIAGNOSIS — Z23 Encounter for immunization: Secondary | ICD-10-CM | POA: Diagnosis not present

## 2019-03-08 DIAGNOSIS — R636 Underweight: Secondary | ICD-10-CM | POA: Diagnosis not present

## 2019-03-08 NOTE — Assessment & Plan Note (Signed)
bp in fair control at this time  BP Readings from Last 1 Encounters:  03/08/19 131/80   No changes needed Most recent labs reviewed  Disc lifstyle change with low sodium diet and exercise

## 2019-03-08 NOTE — Assessment & Plan Note (Signed)
Her copd keeps her appetite low as well as intake Strongly recommended more protein calories in snacks and meals  Meal supplements are ok

## 2019-03-08 NOTE — Assessment & Plan Note (Signed)
Unsure if due to her COPD  Prior smoker No symptoms  Was ref to hematology Agree with that plan Unsure if she may need tx phlebotomy in the future

## 2019-03-08 NOTE — Patient Instructions (Addendum)
There office will call you to set up an appointment with the new pulmonologist   Check in with hematology about your blood count  Re schedule your appt with Dr Tasia Catchings  Blood pressure was better the 2nd check   Flu shot today

## 2019-03-08 NOTE — Progress Notes (Signed)
Subjective:    Patient ID: Carrie Hendricks, female    DOB: [birth year], 68 y.o.   MRN: [id number]  HPI  Pt presents for personal problem = several questions about chronic medical problems  Needs flu shot today-gets the high dose   Wt Readings from Last 3 Encounters:  03/08/19 107 lb 9 oz (48.8 kg)  02/19/19 107 lb 12.8 oz (48.9 kg)  01/29/19 108 lb 3.2 oz (49.1 kg)   17.36 kg/m   Had her bone density test  LFN -3.3  Has elevated hb  Lab Results  Component Value Date   WBC 4.8 02/19/2019   HGB 16.0 (H) 02/19/2019   HCT 44.9 02/19/2019   MCV 94 02/19/2019   PLT 183 02/19/2019  she had an appt with Dr Tasia Catchings   Dr Jamal Collin is leaving pulmonary  He is moving to [facility] She has been stable with her COPD  Did want to check to see if she needs 02   Breathing is fairly stable  Limited re: exertion/heat Able to wear a mask  No recent change in medications   bp is stable today (better on 2nd check) No cp or palpitations or headaches or edema  No side effects to medicines  BP Readings from Last 3 Encounters:  03/08/19 131/80  02/19/19 (!) 152/89  01/29/19 130/80     Patient Active Problem List   Diagnosis Date Noted  . Elevated hemoglobin ([place]) 03/08/2019  . COPD, very severe ([place]) 08/15/2018  . Sleep disorder 10/10/2017  . Screening mammogram, encounter for 09/12/2017  . Ulcerative colitis ([place]) 02/01/2017  . Lumbar degenerative disc disease 01/20/2017  . Scoliosis 06/28/2016  . Welcome to Medicare preventive visit 06/10/2016  . Estrogen deficiency 06/10/2016  . Constipation 03/02/2016  . Underweight 03/02/2016  . Shortness of breath 05/16/2014  . History of COPD 05/16/2014  . Stress reaction 05/16/2014  . Connective tissue disease ([place]) 03/05/2014  . Pedal edema 10/15/2013  . Pulmonary hypertension ([place]) 10/10/2011  . Encounter for routine gynecological examination 07/11/2011  . Routine general medical examination at a health care facility 07/03/2011  .  HEMORRHOIDS, INTERNAL, WITH BLEEDING 03/15/2010  . Essential hypertension 11/16/2006  . Rheumatoid arthritis(714.0) 11/16/2006  . Osteoporosis 11/16/2006   Past Medical History:  Diagnosis Date  . Arthritis    rheumatoid  . Complication of anesthesia   . Hypertension   . Osteoporosis   . PONV (postoperative nausea and vomiting)   . Pulmonary hypertension ([place])   . Rheumatoid arthritis(714.0)    ? scleroderma   Past Surgical History:  Procedure Laterality Date  . colonosc neg  11/08  . COLONOSCOPY WITH PROPOFOL N/A 09/30/2016   Procedure: COLONOSCOPY WITH PROPOFOL;  Surgeon: Jonathon Bellows, MD;  Location: [facility] ENDOSCOPY;  Service: Endoscopy;  Laterality: N/A;  . COLONOSCOPY WITH PROPOFOL N/A 12/08/2017   Procedure: COLONOSCOPY WITH PROPOFOL;  Surgeon: Jonathon Bellows, MD;  Location[facility] ENDOSCOPY;  Service: Gastroenterology;  Laterality: N/A;  . Dexa  07/2005   OP hip- 3.0  . ESOPHAGOGASTRODUODENOSCOPY (EGD) WITH PROPOFOL N/A 07/01/2016   Procedure: ESOPHAGOGASTRODUODENOSCOPY (EGD) WITH PROPOFOL;  Surgeon: Jonathon Bellows, MD;  Location: [facility] ENDOSCOPY;  Service: Endoscopy;  Laterality: N/A;  . FLEXIBLE SIGMOIDOSCOPY N/A 07/01/2016   Procedure: FLEXIBLE SIGMOIDOSCOPY;  Surgeon: Jonathon Bellows, MD;  Location: [facility] ENDOSCOPY;  Service: Endoscopy;  Laterality: N/A;  . LEEP  2005  . Surgery- tendon release in hand  2006  . TUBAL LIGATION     Social History   Tobacco Use  .  Smoking status: Former Smoker    Quit date: 2006    Years since quitting: 14.6  . Smokeless tobacco: Never Used  . Tobacco comment: Quit in 8/07  Substance Use Topics  . Alcohol use: No    Alcohol/week: 0.0 standard drinks  . Drug use: No   Family History  Problem Relation Age of Onset  . Heart disease Mother   . Diabetes Mother   . Hypertension Father   . Macular degeneration Father   . Dementia Father        early  . Arthritis Father        RA  . Alcohol abuse Father   . Breast cancer Sister 80   No Known  Allergies Current Outpatient Medications on File Prior to Visit  Medication Sig Dispense Refill  . albuterol (VENTOLIN HFA) 108 (90 Base) MCG/ACT inhaler Inhale 2 puffs into the lungs every 6 (six) hours as needed for wheezing or shortness of breath. 54 g 1  . amLODipine (NORVASC) 5 MG tablet Take 1 tablet (5 mg total) by mouth daily. 90 tablet 3  . Aspirin (ADULT ASPIRIN LOW STRENGTH) 81 MG EC tablet Take 81 mg by mouth daily.      Marland Kitchen etanercept (ENBREL SURECLICK) 50 MG/ML injection Inject 50 mg into the skin once a week.    . hydrochlorothiazide (HYDRODIURIL) 25 MG tablet Take 1 tablet (25 mg total) by mouth daily. 90 tablet 3  . hydroxychloroquine (PLAQUENIL) 200 MG tablet Take 200 mg by mouth daily.    . mesalamine (APRISO) 0.375 g 24 hr capsule Take 1 capsule (0.375 g total) by mouth daily. 90 capsule 3  . Multiple Vitamin (MULTIVITAMIN) capsule Take 1 capsule by mouth daily.    . Tiotropium Bromide-Olodaterol (STIOLTO RESPIMAT) 2.5-2.5 MCG/ACT AERS Inhale 2 puffs into the lungs daily. 12 g 5  . alendronate (FOSAMAX) 70 MG tablet Take 1 tablet by mouth once a week.     No current facility-administered medications on file prior to visit.      Review of Systems  Constitutional: Positive for fatigue. Negative for activity change, appetite change, diaphoresis, fever and unexpected weight change.  HENT: Negative for congestion, ear pain, rhinorrhea, sinus pressure and sore throat.   Eyes: Negative for pain, redness and visual disturbance.  Respiratory: Positive for shortness of breath. Negative for cough and wheezing.        Baseline sob with copd  Cardiovascular: Negative for chest pain, palpitations and leg swelling.  Gastrointestinal: Negative for abdominal pain, blood in stool, constipation and diarrhea.  Endocrine: Negative for polydipsia and polyuria.  Genitourinary: Negative for dysuria, frequency and urgency.  Musculoskeletal: Negative for arthralgias, back pain and myalgias.   Skin: Negative for pallor and rash.  Allergic/Immunologic: Negative for environmental allergies.  Neurological: Negative for dizziness, syncope and headaches.  Hematological: Negative for adenopathy. Does not bruise/bleed easily.  Psychiatric/Behavioral: Negative for decreased concentration and dysphoric mood. The patient is not nervous/anxious.        Stressors        Objective:   Physical Exam Constitutional:      General: She is not in acute distress.    Appearance: Normal appearance. She is not ill-appearing or diaphoretic.     Comments: underweight  HENT:     Head: Normocephalic and atraumatic.     Mouth/Throat:     Mouth: Mucous membranes are moist.  Eyes:     General: No scleral icterus.    Extraocular Movements: Extraocular movements intact.  Conjunctiva/sclera: Conjunctivae normal.     Pupils: Pupils are equal, round, and reactive to light.  Cardiovascular:     Rate and Rhythm: Normal rate and regular rhythm.  Pulmonary:     Effort: Pulmonary effort is normal.     Breath sounds: Normal breath sounds. No stridor. No rhonchi or rales.     Comments: Diffusely distant bs  Mildly prolonged exp time Abdominal:     General: Abdomen is flat. Bowel sounds are normal.  Lymphadenopathy:     Cervical: No cervical adenopathy.  Skin:    General: Skin is dry.     Coloration: Skin is not jaundiced or pale.     Findings: No bruising or erythema.  Neurological:     Mental Status: She is alert. Mental status is at baseline.     Coordination: Coordination normal.     Deep Tendon Reflexes: Reflexes normal.  Psychiatric:        Mood and Affect: Mood is anxious.     Comments: Pleasant  Mildly anxious           Assessment & Plan:   Problem List Items Addressed This Visit      Cardiovascular and Mediastinum   Essential hypertension    bp in fair control at this time  BP Readings from Last 1 Encounters:  03/08/19 131/80   No changes needed Most recent labs  reviewed  Disc lifstyle change with low sodium diet and exercise          Respiratory   COPD, very severe ([place]) - Primary    Pt needs new pulmonologist as Dr Alva Garnet is moving She would like to est with someone before having testing for 02 need Ref done Per pt copd has been fairly stable  Given flu shot today  Talked about covid prevention       Relevant Orders   Ambulatory referral to Pulmonology     Musculoskeletal and Integument   Osteoporosis    Taking fosamax Dropped off copy of her most recent dexa from Dr Jefm Bryant       Relevant Medications   alendronate (FOSAMAX) 70 MG tablet     Other   Underweight    Her copd keeps her appetite low as well as intake Strongly recommended more protein calories in snacks and meals  Meal supplements are ok       Elevated hemoglobin ([place])    Unsure if due to her COPD  Prior smoker No symptoms  Was ref to hematology Agree with that plan Unsure if she may need tx phlebotomy in the future        Other Visit Diagnoses    Need for influenza vaccination       Relevant Orders   Flu Vaccine QUAD High Dose(Fluad) (Completed)

## 2019-03-08 NOTE — Assessment & Plan Note (Signed)
Taking fosamax Dropped off copy of her most recent dexa from Dr Jefm Bryant

## 2019-03-08 NOTE — Assessment & Plan Note (Signed)
Pt needs new pulmonologist as Dr Alva Garnet is moving She would like to est with someone before having testing for 02 need Ref done Per pt copd has been fairly stable  Given flu shot today  Talked about covid prevention

## 2019-03-12 ENCOUNTER — Ambulatory Visit: Payer: Medicare HMO

## 2019-05-05 ENCOUNTER — Other Ambulatory Visit: Payer: Self-pay | Admitting: Gastroenterology

## 2019-05-07 NOTE — Telephone Encounter (Signed)
500 mg to be taken 4 times daily

## 2019-05-07 NOTE — Telephone Encounter (Signed)
Last office visit 02/19/2019 Ulcerative colitis  Last refill  No appointment is scheduled

## 2019-05-09 ENCOUNTER — Telehealth: Payer: Self-pay | Admitting: Gastroenterology

## 2019-05-09 NOTE — Telephone Encounter (Signed)
Spoke with pt and informed her of Dr. Georgeann Oppenheim recommendation for pt to try sulfasalazine as an alternative to the mesalamine. I informed pt that I replied to her MyChart message with information and recommendations regarding the medication. Pt states she'll contact her insurance to ensure the medication is covered.

## 2019-05-09 NOTE — Telephone Encounter (Signed)
Pt left vm to speak to someone regarding a medicine she is on being discontinued and see which she needs to take now please call pt

## 2019-05-10 DIAGNOSIS — Z79899 Other long term (current) drug therapy: Secondary | ICD-10-CM | POA: Diagnosis not present

## 2019-05-10 DIAGNOSIS — H524 Presbyopia: Secondary | ICD-10-CM | POA: Diagnosis not present

## 2019-05-10 DIAGNOSIS — H25013 Cortical age-related cataract, bilateral: Secondary | ICD-10-CM | POA: Diagnosis not present

## 2019-05-10 DIAGNOSIS — H2513 Age-related nuclear cataract, bilateral: Secondary | ICD-10-CM | POA: Diagnosis not present

## 2019-05-13 ENCOUNTER — Other Ambulatory Visit: Payer: Self-pay

## 2019-05-13 DIAGNOSIS — K5190 Ulcerative colitis, unspecified, without complications: Secondary | ICD-10-CM

## 2019-05-13 MED ORDER — SULFASALAZINE 500 MG PO TABS: 1000.0000 mg | ORAL_TABLET | Freq: Two times a day (BID) | ORAL | 3 refills | Status: DC

## 2019-05-13 NOTE — Addendum Note (Signed)
Addended by: Dorethea Clan on: 05/13/2019 11:13 AM   Modules accepted: Orders

## 2019-05-21 ENCOUNTER — Telehealth: Payer: Medicare HMO | Admitting: Nurse Practitioner

## 2019-05-21 DIAGNOSIS — R42 Dizziness and giddiness: Secondary | ICD-10-CM | POA: Diagnosis not present

## 2019-05-21 DIAGNOSIS — R112 Nausea with vomiting, unspecified: Secondary | ICD-10-CM | POA: Diagnosis not present

## 2019-05-21 MED ORDER — MECLIZINE HCL 25 MG PO TABS: 25.0000 mg | ORAL_TABLET | Freq: Three times a day (TID) | ORAL | 0 refills | Status: DC | PRN

## 2019-05-21 MED ORDER — ONDANSETRON HCL 4 MG PO TABS: 4.0000 mg | ORAL_TABLET | Freq: Three times a day (TID) | ORAL | 0 refills | Status: DC | PRN

## 2019-05-21 NOTE — Progress Notes (Signed)
E Visit for Motion Sickness  We are sorry that you are not feeling well. Here is how we plan to help!  Based on what you have shared with me it looks like you have symptoms of motion sickness.  I have prescribed a medication that will help prevent or alleviate your symptoms:  Meclizine 70m by mouth three times per day as needed for nausea/motion sickness  For nausea and vomiting- zofran 455mBID #20 as needed  Prevention:  You might feel better if you keep your eyes focused on outside while you are in motion. For example, if you are in a car, sit in the front and look in the direction you are moving; if you are on a boat, stay on the deck and look to the horizon. This helps make what you see match the movement you are feeling, and so you are less likely to feel sick.  You should also avoid reading, watching a movie, texting or reading messages, or looking at things close to you inside the vehicle you are riding in.  . Use the seat head rest. Lean your head against the back of the seat or head rest when traveling in vehicles with seats to minimize head movements.  . On a ship: When making your reservations, choose a cabin in the middle of the ship and near the waterline. When on board, go up on deck and focus on the horizon.  . In an airplane: Request a window seat and look out the window. A seat over the front edge of the wing is the most preferable spot (the degree of motion is the lowest here). Direct the air vent to blow cool air on your face.  . On a train: Always face forward and sit near a window.  . In a vehicle: Sit in the front seat; if you are the passenger, look at the scenery in the distance. For some people, driving the vehicle (rather than being a passenger) is an instant remedy.  . Avoid others who have become nauseous with motion sickness. Seeing and smelling others who have motion sickness may cause you to become sick.  GET HELP RIGHT AWAY IF:   Your symptoms do not  improve or worsen within 2 days after treatment.   You cannot keep down fluids after trying the medication.   Other associated symptoms such as severe headache, visual field changes, fever, or intractable nausea and vomiting.  MAKE SURE YOU:   Understand these instructions.  Will watch your condition.  Will get help right away if you are not doing well or get worse.  Thank you for choosing an e-visit.  Your e-visit answers were reviewed by a board certified advanced clinical practitioner to complete your personal care plan. Depending upon the condition, your plan could have included both over the counter or prescription medications.  Please review your pharmacy choice. Be sure that the pharmacy you have chosen is open so that you can pick up your prescription now.  If there is a problem you may message your provider in MyS.N.P.J.o have the prescription routed to another pharmacy.  Your safety is important to usKoreaIf you have drug allergies check your prescription carefully.   For the next 24 hours, you can use MyChart to ask questions about today's visit, request a non-urgent call back, or ask for a work or school excuse from your e-visit provider.  You will get an e-mail in the next two days asking about your experience. I  hope that your e-visit has been valuable and will speed your recovery.   References or for more information: ThenWeb.com.ee https://my.ResearchRoots.be https://www.uptodate.com  5-10 minutes spent reviewing and documenting in chart.

## 2019-05-22 ENCOUNTER — Telehealth: Payer: Self-pay | Admitting: Gastroenterology

## 2019-05-22 NOTE — Telephone Encounter (Signed)
Pt left vm she states she started a new rx on Monday for her stomach  and had a reaction to it she was vomiting, dry heaves bad headaches  Please call to discuss

## 2019-05-22 NOTE — Telephone Encounter (Signed)
Advise to stop , asulfadine can cause that , is there any other mesalamine we can get for her?

## 2019-05-23 ENCOUNTER — Other Ambulatory Visit: Payer: Self-pay

## 2019-05-23 MED ORDER — MESALAMINE 1.2 G PO TBEC: 2.4000 g | DELAYED_RELEASE_TABLET | Freq: Every day | ORAL | 3 refills | Status: DC

## 2019-05-23 NOTE — Telephone Encounter (Signed)
Spoke with pt about the medication change. Pt agrees.

## 2019-05-23 NOTE — Telephone Encounter (Signed)
2 tablets once dail;y please

## 2019-06-03 ENCOUNTER — Other Ambulatory Visit: Payer: Self-pay

## 2019-06-03 ENCOUNTER — Telehealth: Payer: Self-pay | Admitting: Pulmonary Disease

## 2019-06-03 ENCOUNTER — Ambulatory Visit (INDEPENDENT_AMBULATORY_CARE_PROVIDER_SITE_OTHER): Payer: Medicare HMO | Admitting: Pulmonary Disease

## 2019-06-03 ENCOUNTER — Encounter: Payer: Self-pay | Admitting: Pulmonary Disease

## 2019-06-03 DIAGNOSIS — J449 Chronic obstructive pulmonary disease, unspecified: Secondary | ICD-10-CM

## 2019-06-03 DIAGNOSIS — Z20822 Contact with and (suspected) exposure to covid-19: Secondary | ICD-10-CM

## 2019-06-03 DIAGNOSIS — Z20828 Contact with and (suspected) exposure to other viral communicable diseases: Secondary | ICD-10-CM

## 2019-06-03 DIAGNOSIS — I272 Pulmonary hypertension, unspecified: Secondary | ICD-10-CM | POA: Diagnosis not present

## 2019-06-03 DIAGNOSIS — M069 Rheumatoid arthritis, unspecified: Secondary | ICD-10-CM

## 2019-06-03 DIAGNOSIS — K5100 Ulcerative (chronic) pancolitis without complications: Secondary | ICD-10-CM

## 2019-06-03 MED ORDER — PREDNISONE 10 MG PO TABS: ORAL_TABLET | ORAL | 0 refills | Status: DC

## 2019-06-03 NOTE — Assessment & Plan Note (Signed)
Plan: Follow-up with gastroenterology today regarding your acute worsening symptoms since being tried on sulfasalazine  Follow-up with gastroenterology today regarding your concern with taking Lialda  Follow-up with gastroenterology today regarding the referral they placed to hematology Dr. Tasia Catchings, as you are now reporting that you are interested in seeing them

## 2019-06-03 NOTE — Progress Notes (Signed)
Virtual Visit via Telephone Note  I connected with Carrie Hendricks on 06/03/19 at  9:30 AM EST by telephone and verified that I am speaking with the correct person using two identifiers.  Location: Patient: Home Provider: [facility] - [address], [place], [place], [place] [address]   I discussed the limitations, risks, security and privacy concerns of performing an evaluation and management service by telephone and the availability of in person appointments. I also discussed with the patient that there may be a patient responsible charge related to this service. The patient expressed understanding and agreed to proceed.  Patient consented to consult via telephone: Yes People present and their role in pt care: Pt   History of Present Illness:  68 year old female former smoker followed in our office for COPD  Past medical history: Hypertension, rheumatoid arthritis, osteoporosis, pulmonary hypertension, connective tissue disease, ulcerative colitis Smoking history: Former smoker.  Quit 2006. Maintenance: Plaquenil, Stiolto Respimat Patient of Dr. Alva Garnet  Chief complaint: Increased shortness of breath   68 year old female former smoker followed in our office for COPD contacted our office on 06/03/2019 to report increased shortness of breath and acute symptoms.  Patient reports that her symptoms started on 05/29/2019.  She does still work full-time for living.  Prior to last week patient was not wearing a mask at work but they were physically distancing.  There is a new requirement now at her work to wear a mask physically even when sitting in her desk.  She thinks that this may have affected her breathing.  She denies fevers, body aches, chills.  She has had increased shortness of breath.  She has had some wheezing.  Her mouth has been dry.  She does feel that her breathing has worsened from her baseline line symptoms.  She continues to use her Stiolto.  She also uses her  rescue inhaler every 6 hours for the last 3 days.    Patient is also followed by gastroenterology for ulcerative colitis.  She was recently tried on sulfasalazine.  She had a reaction to this and this was changed to [place].  She believes that her fatigue as well as shortness of breath is directly been affected since trying the sulfasalazine.  She has not followed up with gastroenterology regarding this.  She also was previously recommended to follow-up with hematology due to persistently elevated hemoglobin levels.  She has not done this yet either.  She is interested in obtaining Covid testing today.  Observations/Objective:  DATA: 04/04/17 CXR: Severe hyperinflation and hyperlucency without acute findings 05/18/17 PFTs: Very severe obstruction, FEV1 0.96 L (38%), hyperinflation-TLC 6.92 L (127%), RV 4.45 L (208%), DLCO 4.8 (28%), DLCO/VA 1.35 (37%)  Social History   Tobacco Use  Smoking Status Former Smoker  . Quit date: 2006  . Years since quitting: 14.9  Smokeless Tobacco Never Used  Tobacco Comment   Quit in 8/07   Immunization History  Administered Date(s) Administered  . Fluad Quad(high Dose 65+) 03/08/2019  . Hep A / Hep B 11/03/2016, 12/06/2016  . Hepatitis B, adult 05/09/2017  . Influenza Split 04/03/2011  . Influenza Whole 04/03/2010  . Influenza, High Dose Seasonal PF 04/08/2016, 03/31/2017, 04/21/2018  . Influenza,inj,Quad PF,6+ Mos 03/05/2014  . Influenza-Unspecified 04/03/2013, 03/05/2014, 05/03/2015  . Pneumococcal Conjugate-13 06/10/2016  . Pneumococcal Polysaccharide-23 03/28/2007, 10/15/2013, 08/15/2018  . Td 06/11/2010    Assessment and Plan:  Pulmonary hypertension Plan: We will continue to clinically monitor We will establish patient with Dr.  Patsey Berthold and 30-minute slot as she is a former patient of Dr. Alva Garnet  COPD, very severe [facility]) Plan: Continue Stiolto Continue rest inhaler Prednisone short course prescribed today Outpatient Covid testing  obtained today We will establish patient with Dr. Patsey Berthold  Ulcerative colitis [facility]) Plan: Follow-up with gastroenterology today regarding your acute worsening symptoms since being tried on sulfasalazine  Follow-up with gastroenterology today regarding your concern with taking Lialda  Follow-up with gastroenterology today regarding the referral they placed to hematology Dr. Tasia Catchings, as you are now reporting that you are interested in seeing them  Rheumatoid arthritis [facility]) Plan: Continue to follow-up with rheumatology Dr. Jefm Bryant  Suspected COVID-19 virus infection Patient still works full-time Previously not wearing a mask at work up until last week Worsening shortness of breath and wheezing Denies body aches, chills, loss of taste or smell  Plan: Obtain outpatient Covid testing   Follow Up Instructions:  Return in about 4 weeks (around 07/01/2019), or if symptoms worsen or fail to improve, for [facility] [place] - Dr. Patsey Berthold.   I discussed the assessment and treatment plan with the patient. The patient was provided an opportunity to ask questions and all were answered. The patient agreed with the plan and demonstrated an understanding of the instructions.   The patient was advised to call back or seek an in-person evaluation if the symptoms worsen or if the condition fails to improve as anticipated.  I provided 26 minutes of non-face-to-face time during this encounter.   Lauraine Rinne, NP

## 2019-06-03 NOTE — Assessment & Plan Note (Signed)
Patient still works full-time Previously not wearing a mask at work up until last week Worsening shortness of breath and wheezing Denies body aches, chills, loss of taste or smell  Plan: Obtain outpatient Covid testing

## 2019-06-03 NOTE — Telephone Encounter (Signed)
Called and spoke to pt.  Pt reports of increased sob with exertion, dry cough at times prod with yellow mucus and wheezing x5d.  Denied fever, chills or sweats.  Pt is getting covid tested today.  Using albuterol HFA TID and stiolto 2 puff daily with some relief.  Not currently taking any OTC medication.  Pt has been scheduled for phone visit today at 9:30a. Nothing further is needed.

## 2019-06-03 NOTE — Assessment & Plan Note (Signed)
Plan: Continue Stiolto Continue rest inhaler Prednisone short course prescribed today Outpatient Covid testing obtained today We will establish patient with Dr. Patsey Berthold

## 2019-06-03 NOTE — Patient Instructions (Addendum)
You were seen today by Lauraine Rinne, NP  for:   1. COPD, very severe ([place])  - predniSONE (DELTASONE) 10 MG tablet; Take 2 tablets (27m total) daily for the next 5 days. Take in the AM with food.  Dispense: 10 tablet; Refill: 0 - Novel Coronavirus, NAA ([facility])  Stiolto Respimat inhaler >>>2 puffs daily >>>Take this no matter what >>>This is not a rescue inhaler  Only use your albuterol as a rescue medication to be used if you can't catch your breath by resting or doing a relaxed purse lip breathing pattern.  - The less you use it, the better it will work when you need it. - Ok to use up to 2 puffs  every 4 hours if you must but call for immediate appointment if use goes up over your usual need - Don't leave home without it !!  (think of it like the spare tire for your car)   Note your daily symptoms > remember "red flags" for COPD:   >>>Increase in cough >>>increase in sputum production >>>increase in shortness of breath or activity  intolerance.   If you notice these symptoms, please call the office to be seen.    2. Pulmonary hypertension (HCC)  Clinically stable at this time  Establish care with Dr. GPatsey Bertholdand 30-minute slot  3. Rheumatoid arthritis, involving unspecified site, unspecified whether rheumatoid factor present ([place]  Continue to follow-up with Dr. KJefm Bryant 4. Ulcerative pancolitis without complication ([facility]  Contact gastroenterology Dr. AGeorgeann Oppenheimoffice today to report your concerns with Lialda as well as your symptoms since being tried on sulfasalazine  Please contact gastroenterology as well to report that you are now interested in seeing a hematologist  5. Suspected COVID-19 virus infection  - Novel Coronavirus, NAA ([facility])  COVID Testing Site Locations (For sick patients only, pre-procedure is done differently)  . [place] [place] [address]. [place] (on [employer]   o MNature conservation officer . [place], [facility] Centerand AResearch [facility] LLCwill be open from 10 a.m. - 3 p.m.     We recommend today:  Orders Placed This Encounter  Procedures  . Novel Coronavirus, NAA ([facility])    Order Specific Question:   Is this test for diagnosis or screening    Answer:   Diagnosis of ill patient    Order Specific Question:   Symptomatic for COVID-19 as defined by CDC    Answer:   Yes    Order Specific Question:   Date of Symptom Onset    Answer:   05/29/2019    Order Specific Question:   Hospitalized for COVID-19    Answer:   No    Order Specific Question:   Admitted to ICU for COVID-19    Answer:   No    Order Specific Question:   Previously tested for COVID-19    Answer:   No    Order Specific Question:   Resident in a congregate (group) care setting    Answer:   No    Order Specific Question:   Is the patient student?    Answer:   No    Order Specific Question:   Employed in healthcare setting    Answer:   No    Order Specific Question:   Pregnant  Answer:   No   Orders Placed This Encounter  Procedures  . Novel Coronavirus, NAA ([facility])   Meds ordered this encounter  Medications  . predniSONE (DELTASONE) 10 MG tablet    Sig: Take 2 tablets (41m total) daily for the next 5 days. Take in the AM with food.    Dispense:  10 tablet    Refill:  0    Follow Up:    Return in about 4 weeks (around 07/01/2019), or if symptoms worsen or fail to improve[facility]   Please do your part to reduce the spread of COVID-19:      Reduce your risk of any infection  and COVID19 by using the similar precautions used for avoiding the common cold or flu:  .Marland KitchenWash your hands often with soap and warm water for at least 20 seconds.  If soap and water are not readily available, use an alcohol-based hand sanitizer with at least 60% alcohol.  . If coughing or sneezing, cover  your mouth and nose by coughing or sneezing into the elbow areas of your shirt or coat, into a tissue or into your sleeve (not your hands). .Langley GaussA MASK when in public  . Avoid shaking hands with others and consider head nods or verbal greetings only. . Avoid touching your eyes, nose, or mouth with unwashed hands.  . Avoid close contact with people who are sick. . Avoid places or events with large numbers of people in one location, like concerts or sporting events. . If you have some symptoms but not all symptoms, continue to monitor at home and seek medical attention if your symptoms worsen. . If you are having a medical emergency, call 911.   [place]/ e-Visit: [email or web address]        [facility]: [facility]: [phone]                  [facility] [phone]    It is flu season:   >>> Best ways to protect herself from the flu: Receive the yearly flu vaccine, practice good hand hygiene washing with soap and also using hand sanitizer when available, eat a nutritious meals, get adequate rest, hydrate appropriately   Please contact the office if your symptoms worsen or you have concerns that you are not improving.   Thank you for choosing [facility] for your healthcare, and for allowing uKoreato partner with you on your healthcare journey. I am thankful to be able to provide care to you today.   BWyn QuakerFNP-C

## 2019-06-03 NOTE — Assessment & Plan Note (Signed)
Plan: We will continue to clinically monitor We will establish patient with Dr. Patsey Berthold and 30-minute slot as she is a former patient of Dr. Alva Garnet

## 2019-06-03 NOTE — Assessment & Plan Note (Signed)
Plan: Continue to follow-up with rheumatology Dr. Jefm Bryant

## 2019-06-04 LAB — NOVEL CORONAVIRUS, NAA: SARS-CoV-2, NAA: NOT DETECTED

## 2019-06-05 ENCOUNTER — Telehealth: Payer: Self-pay

## 2019-06-05 NOTE — Progress Notes (Signed)
SARS-CoV-2 test is negative.  This is good news.  Proceed forward with work-up as planned.  Therasa Lorenzi, FNP 

## 2019-06-05 NOTE — Progress Notes (Signed)
Covid negative. See previous note.  Aaron Edelman

## 2019-06-05 NOTE — Telephone Encounter (Signed)
Pt left a vm stating she would like to proceed with the referral to hematology for evaluation of elevated hemoglobin as Dr. Vicente Males suggested a few months ago. The referral was sent to Dr. Tasia Catchings at [facility] back in August but pt refused service when she was contacted to schedule. I returned pt's call and explained that I called Dr. Collie Siad office and left a vm to find out if a new referral would need to be submitted or if pt could contact their office to schedule. I explained that I would contact her once I receive a response. Pt understands.

## 2019-06-09 ENCOUNTER — Other Ambulatory Visit: Payer: Self-pay | Admitting: Pulmonary Disease

## 2019-06-09 DIAGNOSIS — J449 Chronic obstructive pulmonary disease, unspecified: Secondary | ICD-10-CM

## 2019-06-10 NOTE — Telephone Encounter (Signed)
Received return call from Dr. Collie Siad office and was informed that no new referral was needed and that they would contact pt to schedule.

## 2019-06-11 ENCOUNTER — Other Ambulatory Visit: Payer: Self-pay | Admitting: Pulmonary Disease

## 2019-06-11 DIAGNOSIS — J449 Chronic obstructive pulmonary disease, unspecified: Secondary | ICD-10-CM

## 2019-06-12 ENCOUNTER — Telehealth: Payer: Self-pay | Admitting: Pulmonary Disease

## 2019-06-12 NOTE — Telephone Encounter (Signed)
Ok to prescribe:   Azithromycin 238m tablet  >>>Take 2 tablets (5019mtotal) today, and then 1 tablet (25057mfor the next four days  >>>take with food  >>>can also take probiotic and / or yogurt while on antibiotic   Prednisone 16m47mblet  >>>4 tabs for 2 days, then 3 tabs for 2 days, 2 tabs for 2 days, then 1 tab for 2 days, then stop >>>take with food  >>>take in the morning   Please place the order.   Please ensure that patient has contacted gastroenterology.  She reported that she would do that.  Has she followed up with them?  BriaWyn QuakerP

## 2019-06-12 NOTE — Telephone Encounter (Signed)
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  Called and spoke to pt, who is requesting additional prednisone.  Pt had OV 06/03/2019 with Wyn Quaker, NP and was prescribed prednisone 62m x5 days.  Pt stated that her breathing improved when on prednisone, however she has started to develop increased sob with exertion again. She also reports of prod cough with light yellow mucus.  Pt has recently started wearing mask at work and is concerned that this may be causing the sob.  Pt did have covid test as recommended, which was negative.  She is using albuterol HFA BID with mild relief in sx.  Denied fever, chills or sweats.   BAaron Edelmanplease advise. Thanks

## 2019-06-13 MED ORDER — PREDNISONE 10 MG PO TABS: ORAL_TABLET | ORAL | 0 refills | Status: DC

## 2019-06-13 MED ORDER — AZITHROMYCIN 250 MG PO TABS: ORAL_TABLET | ORAL | 0 refills | Status: AC

## 2019-06-13 NOTE — Telephone Encounter (Signed)
Left message on both mobile and home number listed on file.

## 2019-06-13 NOTE — Telephone Encounter (Signed)
I disagree with this.  The patient committed to me that she would contact gastroenterology.  Her shortness of breath worsened when she was trialed on sulfasalazine I believe.  Her and her daughter both agreed that they would contact gastroenterology.  It is our recommendation that she contacts them and follows up with them sooner than February/2021.  Ultimately whenever the patient decides to do is ultimately her choice.  Patient needs to keep upcoming appointment to establish care with Dr. Patsey Berthold.  Wyn Quaker, FNP

## 2019-06-13 NOTE — Telephone Encounter (Signed)
Patient was returning your call

## 2019-06-13 NOTE — Telephone Encounter (Signed)
Thank you   Carrie Hendricks  

## 2019-06-13 NOTE — Telephone Encounter (Signed)
Left message for pt

## 2019-06-13 NOTE — Telephone Encounter (Signed)
Pt is aware of below recommendations and voiced her understanding.  Rx for prednisone and zpak has been sent to preferred pharmacy. Pt stated that she did not contact gastroenterology, as she does not recall be instructed to do so. Pt does have a pending appt with gastroenterology in 08/2019.    Will route to [place] as an [place].

## 2019-06-13 NOTE — Telephone Encounter (Signed)
Spoke to pt and relayed below recommendations. Pt stated that she will contact GI for sooner appt.  Nothing further is needed.  Will route as an [place]

## 2019-06-14 ENCOUNTER — Other Ambulatory Visit: Payer: Self-pay

## 2019-06-14 ENCOUNTER — Inpatient Hospital Stay: Payer: Medicare HMO | Attending: Oncology | Admitting: Oncology

## 2019-06-14 ENCOUNTER — Encounter: Payer: Self-pay | Admitting: Oncology

## 2019-06-14 ENCOUNTER — Inpatient Hospital Stay: Payer: Medicare HMO

## 2019-06-14 VITALS — BP 153/93 | HR 98 | Temp 98.9°F | Ht 64.5 in | Wt 106.8 lb

## 2019-06-14 DIAGNOSIS — D751 Secondary polycythemia: Secondary | ICD-10-CM | POA: Insufficient documentation

## 2019-06-14 DIAGNOSIS — Z87891 Personal history of nicotine dependence: Secondary | ICD-10-CM | POA: Insufficient documentation

## 2019-06-14 DIAGNOSIS — M818 Other osteoporosis without current pathological fracture: Secondary | ICD-10-CM | POA: Diagnosis not present

## 2019-06-14 DIAGNOSIS — J449 Chronic obstructive pulmonary disease, unspecified: Secondary | ICD-10-CM

## 2019-06-14 DIAGNOSIS — M069 Rheumatoid arthritis, unspecified: Secondary | ICD-10-CM

## 2019-06-14 LAB — URINALYSIS, COMPLETE (UACMP) WITH MICROSCOPIC
Bacteria, UA: NONE SEEN
Bilirubin Urine: NEGATIVE
Glucose, UA: NEGATIVE mg/dL
Hgb urine dipstick: NEGATIVE
Ketones, ur: NEGATIVE mg/dL
Leukocytes,Ua: NEGATIVE
Nitrite: NEGATIVE
Protein, ur: NEGATIVE mg/dL
Specific Gravity, Urine: 1.004 — ABNORMAL LOW (ref 1.005–1.030)
Squamous Epithelial / HPF: NONE SEEN (ref 0–5)
WBC, UA: NONE SEEN WBC/hpf (ref 0–5)
pH: 7 (ref 5.0–8.0)

## 2019-06-14 LAB — CBC WITH DIFFERENTIAL/PLATELET
Abs Immature Granulocytes: 0.01 10*3/uL (ref 0.00–0.07)
Basophils Absolute: 0 10*3/uL (ref 0.0–0.1)
Basophils Relative: 0 %
Eosinophils Absolute: 0 10*3/uL (ref 0.0–0.5)
Eosinophils Relative: 0 %
HCT: 45.2 % (ref 36.0–46.0)
Hemoglobin: 15.7 g/dL — ABNORMAL HIGH (ref 12.0–15.0)
Immature Granulocytes: 0 %
Lymphocytes Relative: 16 %
Lymphs Abs: 0.4 10*3/uL — ABNORMAL LOW (ref 0.7–4.0)
MCH: 32.8 pg (ref 26.0–34.0)
MCHC: 34.7 g/dL (ref 30.0–36.0)
MCV: 94.4 fL (ref 80.0–100.0)
Monocytes Absolute: 0.1 10*3/uL (ref 0.1–1.0)
Monocytes Relative: 4 %
Neutro Abs: 2.1 10*3/uL (ref 1.7–7.7)
Neutrophils Relative %: 80 %
Platelets: 193 10*3/uL (ref 150–400)
RBC: 4.79 MIL/uL (ref 3.87–5.11)
RDW: 11.9 % (ref 11.5–15.5)
WBC: 2.7 10*3/uL — ABNORMAL LOW (ref 4.0–10.5)
nRBC: 0 % (ref 0.0–0.2)

## 2019-06-14 NOTE — Progress Notes (Signed)
Patient stated that she had been ding well. Patient stated that she had not noticed any blood recently.

## 2019-06-17 ENCOUNTER — Encounter: Payer: Self-pay | Admitting: Oncology

## 2019-06-17 LAB — ERYTHROPOIETIN: Erythropoietin: 3.5 m[IU]/mL (ref 2.6–18.5)

## 2019-06-17 NOTE — Progress Notes (Signed)
Hematology/Oncology Consult note [facility] [phone] [phone]  Patient Care Team: Abner Greenspan, MD as PCP - General   Name of the patient: Carrie Hendricks  [id number]  04-Jul-1951    Reason for referral- polycythemia   Referring physician- Dr. Vicente Males  Date of visit: 06/17/19   History of presenting illness- Patient is a 68 year old female with a past medical history significant for hypertension COPD osteoporosis among other medical problems.  She has been referred to [place] for polycythemia.  Looking back at her prior CBCs her hemoglobin has been mostly ranging between 14.5-16.  Most recent CBC from 02/19/2019 showed white count of 4.8, H&H of 16/44.9 and a platelet count of 183.  She does have a history of chronic COPD and was an ex-smoker.  Overall her appetite is good and her weight has remained stable.  ECOG PS- 1  Pain scale- 0   Review of systems- Review of Systems  Constitutional: Positive for malaise/fatigue. Negative for chills, fever and weight loss.  HENT: Negative for congestion, ear discharge and nosebleeds.   Eyes: Negative for blurred vision.  Respiratory: Negative for cough, hemoptysis, sputum production, shortness of breath and wheezing.   Cardiovascular: Negative for chest pain, palpitations, orthopnea and claudication.  Gastrointestinal: Negative for abdominal pain, blood in stool, constipation, diarrhea, heartburn, melena, nausea and vomiting.  Genitourinary: Negative for dysuria, flank pain, frequency, hematuria and urgency.  Musculoskeletal: Negative for back pain, joint pain and myalgias.  Skin: Negative for rash.  Neurological: Negative for dizziness, tingling, focal weakness, seizures, weakness and headaches.  Endo/Heme/Allergies: Does not bruise/bleed easily.  Psychiatric/Behavioral: Negative for depression and suicidal ideas. The patient does not have insomnia.     No Known Allergies  Patient Active  Problem List   Diagnosis Date Noted  . Suspected COVID-19 virus infection 06/03/2019  . Elevated hemoglobin ([place]) 03/08/2019  . COPD, very severe ([place]) 08/15/2018  . Sleep disorder 10/10/2017  . Screening mammogram, encounter for 09/12/2017  . Ulcerative colitis ([place]) 02/01/2017  . Lumbar degenerative disc disease 01/20/2017  . Scoliosis 06/28/2016  . Welcome to Medicare preventive visit 06/10/2016  . Estrogen deficiency 06/10/2016  . Constipation 03/02/2016  . Underweight 03/02/2016  . Shortness of breath 05/16/2014  . History of COPD 05/16/2014  . Stress reaction 05/16/2014  . Connective tissue disease ([place]) 03/05/2014  . Pedal edema 10/15/2013  . Pulmonary hypertension ([place]) 10/10/2011  . Encounter for routine gynecological examination 07/11/2011  . Routine general medical examination at a health care facility 07/03/2011  . HEMORRHOIDS, INTERNAL, WITH BLEEDING 03/15/2010  . Essential hypertension 11/16/2006  . Rheumatoid arthritis ([place]) 11/16/2006  . Osteoporosis 11/16/2006     Past Medical History:  Diagnosis Date  . Arthritis    rheumatoid  . Complication of anesthesia   . Hypertension   . Osteoporosis   . PONV (postoperative nausea and vomiting)   . Pulmonary hypertension ([place])   . Rheumatoid arthritis(714.0)    ? scleroderma     Past Surgical History:  Procedure Laterality Date  . colonosc neg  11/08  . COLONOSCOPY WITH PROPOFOL N/A 09/30/2016   Procedure: COLONOSCOPY WITH PROPOFOL;  Surgeon: Jonathon Bellows, MD;  Location: [facility] ENDOSCOPY;  Service: Endoscopy;  Laterality: N/A;  . COLONOSCOPY WITH PROPOFOL N/A 12/08/2017   Procedure: COLONOSCOPY WITH PROPOFOL;  Surgeon: Jonathon Bellows, MD;  Location: [facility] ENDOSCOPY;  Service: Gastroenterology;  Laterality: N/A;  . Dexa  07/2005   OP hip- 3.0  . ESOPHAGOGASTRODUODENOSCOPY (EGD) WITH PROPOFOL N/A 07/01/2016  Procedure: ESOPHAGOGASTRODUODENOSCOPY (EGD) WITH PROPOFOL;  Surgeon: Jonathon Bellows, MD;  Location: [facility] ENDOSCOPY;   Service: Endoscopy;  Laterality: N/A;  . FLEXIBLE SIGMOIDOSCOPY N/A 07/01/2016   Procedure: FLEXIBLE SIGMOIDOSCOPY;  Surgeon: Jonathon Bellows, MD;  Location: [facility] ENDOSCOPY;  Service: Endoscopy;  Laterality: N/A;  . LEEP  2005  . Surgery- tendon release in hand  2006  . TUBAL LIGATION      Social History   Socioeconomic History  . Marital status: Single    Spouse name: Not on file  . Number of children: 3  . Years of education: Not on file  . Highest education level: Not on file  Occupational History    Employer: [employer]  Tobacco Use  . Smoking status: Former Smoker    Quit date: 2006    Years since quitting: 14.9  . Smokeless tobacco: Never Used  . Tobacco comment: Quit in 8/07  Substance and Sexual Activity  . Alcohol use: No    Alcohol/week: 0.0 standard drinks  . Drug use: No  . Sexual activity: Not Currently  Other Topics Concern  . 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    Social Determinants of Health   Financial Resource Strain:   . Difficulty of Paying Living Expenses: Not on file  Food Insecurity:   . Worried About Charity fundraiser in the Last Year: Not on file  . Ran Out of Food in the Last Year: Not on file  Transportation Needs:   . Lack of Transportation (Medical): Not on file  . Lack of Transportation (Non-Medical): Not on file  Physical Activity:   . Days of Exercise per Week: Not on file  . Minutes of Exercise per Session: Not on file  Stress:   . Feeling of Stress : Not on file  Social Connections:   . Frequency of Communication with Friends and Family: Not on file  . Frequency of Social Gatherings with Friends and Family: Not on file  . Attends Religious Services: Not on file  . Active Member of Clubs or Organizations: Not on file  . Attends Archivist Meetings: Not on file  . Marital Status: Not on file  Intimate Partner Violence:   . Fear of Current or Ex-Partner:  Not on file  . Emotionally Abused: Not on file  . Physically Abused: Not on file  . Sexually Abused: Not on file     Family History  Problem Relation Age of Onset  . Heart disease Mother   . Diabetes Mother   . Hypertension Father   . Macular degeneration Father   . Dementia Father        early  . Arthritis Father        RA  . Alcohol abuse Father   . Breast cancer Sister 65     Current Outpatient Medications:  .  albuterol (VENTOLIN HFA) 108 (90 Base) MCG/ACT inhaler, Inhale 2 puffs into the lungs every 6 (six) hours as needed for wheezing or shortness of breath., Disp: 54 g, Rfl: 1 .  alendronate (FOSAMAX) 70 MG tablet, Take 1 tablet by mouth once a week., Disp: , Rfl:  .  amLODipine (NORVASC) 5 MG tablet, Take 1 tablet (5 mg total) by mouth daily., Disp: 90 tablet, Rfl: 3 .  Aspirin (ADULT ASPIRIN LOW STRENGTH) 81 MG EC tablet, Take 81 mg by mouth daily.  , Disp: , Rfl:  .  azithromycin (ZITHROMAX) 250 MG tablet, Take 2 tablets (500 mg) on  Day 1,  followed by 1 tablet (250 mg) once daily on Days 2 through 5., Disp: 6 each, Rfl: 0 .  etanercept (ENBREL SURECLICK) 50 MG/ML injection, Inject 50 mg into the skin once a week., Disp: , Rfl:  .  hydrochlorothiazide (HYDRODIURIL) 25 MG tablet, Take 1 tablet (25 mg total) by mouth daily., Disp: 90 tablet, Rfl: 3 .  hydroxychloroquine (PLAQUENIL) 200 MG tablet, Take 200 mg by mouth daily., Disp: , Rfl:  .  mesalamine (LIALDA) 1.2 g EC tablet, Take 2 tablets (2.4 g total) by mouth daily with breakfast., Disp: 180 tablet, Rfl: 3 .  Multiple Vitamin (MULTIVITAMIN) capsule, Take 1 capsule by mouth daily., Disp: , Rfl:  .  predniSONE (DELTASONE) 10 MG tablet, Take 2 tablets (53m total) daily for the next 5 days. Take in the AM with food., Disp: 10 tablet, Rfl: 0 .  Tiotropium Bromide-Olodaterol (STIOLTO RESPIMAT) 2.5-2.5 MCG/ACT AERS, Inhale 2 puffs into the lungs daily., Disp: 12 g, Rfl: 5 .  ondansetron (ZOFRAN-ODT) 4 MG disintegrating  tablet, Take 1 tablet by mouth as needed., Disp: , Rfl:    Physical exam:  Vitals:   06/14/19 1441  BP: (!) 153/93  Pulse: 98  Temp: 98.9 F (37.2 C)  TempSrc: Tympanic  Weight: 106 lb 12.8 oz (48.4 kg)  Height: 5' 4.5" (1.638 m)   Physical Exam Constitutional:      General: She is not in acute distress. HENT:     Head: Normocephalic and atraumatic.  Eyes:     Pupils: Pupils are equal, round, and reactive to light.  Cardiovascular:     Rate and Rhythm: Normal rate and regular rhythm.     Heart sounds: Normal heart sounds.  Pulmonary:     Effort: Pulmonary effort is normal.     Breath sounds: Normal breath sounds.  Abdominal:     General: Bowel sounds are normal.     Palpations: Abdomen is soft.  Musculoskeletal:     Cervical back: Normal range of motion.  Skin:    General: Skin is warm and dry.  Neurological:     Mental Status: She is alert and oriented to person, place, and time.        CMP Latest Ref Rng & Units 02/19/2019  Glucose 65 - 99 mg/dL 102(H)  BUN 8 - 27 mg/dL 10  Creatinine 0.57 - 1.00 mg/dL 0.75  Sodium 134 - 144 mmol/L 137  Potassium 3.5 - 5.2 mmol/L 3.7  Chloride 96 - 106 mmol/L 94(L)  CO2 20 - 29 mmol/L 26  Calcium 8.7 - 10.3 mg/dL 10.2  Total Protein 6.0 - 8.5 g/dL 7.6  Total Bilirubin 0.0 - 1.2 mg/dL 0.7  Alkaline Phos 39 - 117 IU/L 55  AST 0 - 40 IU/L 25  ALT 0 - 32 IU/L 20   CBC Latest Ref Rng & Units 06/14/2019  WBC 4.0 - 10.5 K/uL 2.7(L)  Hemoglobin 12.0 - 15.0 g/dL 15.7(H)  Hematocrit 36.0 - 46.0 % 45.2  Platelets 150 - 400 K/uL 193     Assessment and plan- Patient is a 68y.o. female referred for polycythemia  Patient is borderline polycythemia as by definition it requires hemoglobin to be greater than 16.  I suspect this is secondary to her underlying COPD.  I will check a CBC, JAK2 mutation testing as well as EPO testing today to rule out polycythemia vera.  Also check urinalysis a certain tumor  such as renal cell carcinoma  can cause polycythemia as well but typically we would see high EPO levels in these patients.  Video visit in 1 week's time to discuss results of blood work   Thank you for this kind referral and the opportunity to participate in the care of this patient   Visit Diagnosis 1. Polycythemia     Dr. Randa Evens, MD, MPH [facility] [phone] 06/17/2019  12:38 PM

## 2019-06-21 ENCOUNTER — Inpatient Hospital Stay (HOSPITAL_BASED_OUTPATIENT_CLINIC_OR_DEPARTMENT_OTHER): Payer: Medicare HMO | Admitting: Oncology

## 2019-06-21 ENCOUNTER — Other Ambulatory Visit: Payer: Self-pay

## 2019-06-21 ENCOUNTER — Encounter: Payer: Self-pay | Admitting: Oncology

## 2019-06-21 DIAGNOSIS — D751 Secondary polycythemia: Secondary | ICD-10-CM | POA: Diagnosis not present

## 2019-06-21 DIAGNOSIS — D72810 Lymphocytopenia: Secondary | ICD-10-CM

## 2019-06-21 LAB — JAK2 GENOTYPR

## 2019-06-21 NOTE — Progress Notes (Signed)
Patient stated that she had been doing well.

## 2019-06-28 NOTE — Progress Notes (Signed)
I connected with Carrie Hendricks on 06/28/19 at  2:00 PM EST by video enabled telemedicine visit and verified that I am speaking with the correct person using two identifiers.   I discussed the limitations, risks, security and privacy concerns of performing an evaluation and management service by telemedicine and the availability of in-person appointments. I also discussed with the patient that there may be a patient responsible charge related to this service. The patient expressed understanding and agreed to proceed.  Other persons participating in the visit and their role in the encounter:  none  Patient's location:  home Provider's location:  work  Chief Complaint:  Discuss results of bloodwork  Diagnosis: secondary polycythemia likely due to COPD  History of present illness: Patient is a 68-year-old female with a past medical history significant for hypertension COPD osteoporosis among other medical problems.  She has been referred to us for polycythemia.  Looking back at her prior CBCs her hemoglobin has been mostly ranging between 14.5-16.  Most recent CBC from 02/19/2019 showed white count of 4.8, H&H of 16/44.9 and a platelet count of 183.  She does have a history of chronic COPD and was an ex-smoker.   Cbc on 06/14/19 showed wbc of 2.7 with anc of 2.1. H/H 15.7/45.2. platelets normal. jak2 negative EPO normal. UA negative for hematuria  Interval history she has mild baseline fatigue. She denies other complaints   Review of Systems  Constitutional: Positive for malaise/fatigue. Negative for chills, fever and weight loss.  HENT: Negative for congestion, ear discharge and nosebleeds.   Eyes: Negative for blurred vision.  Respiratory: Negative for cough, hemoptysis, sputum production, shortness of breath and wheezing.   Cardiovascular: Negative for chest pain, palpitations, orthopnea and claudication.  Gastrointestinal: Negative for abdominal pain, blood in stool, constipation,  diarrhea, heartburn, melena, nausea and vomiting.  Genitourinary: Negative for dysuria, flank pain, frequency, hematuria and urgency.  Musculoskeletal: Negative for back pain, joint pain and myalgias.  Skin: Negative for rash.  Neurological: Negative for dizziness, tingling, focal weakness, seizures, weakness and headaches.  Endo/Heme/Allergies: Does not bruise/bleed easily.  Psychiatric/Behavioral: Negative for depression and suicidal ideas. The patient does not have insomnia.     No Known Allergies  Past Medical History:  Diagnosis Date  . Arthritis    rheumatoid  . Complication of anesthesia   . Hypertension   . Osteoporosis   . PONV (postoperative nausea and vomiting)   . Pulmonary hypertension ([facility])   . Rheumatoid arthritis(714.0)    ? scleroderma    Past Surgical History:  Procedure Laterality Date  . colonosc neg  11/08  . COLONOSCOPY WITH PROPOFOL N/A 09/30/2016   Procedure: COLONOSCOPY WITH PROPOFOL;  Surgeon: Kiran Anna, MD;  Location: [facility] ENDOSCOPY;  Service: Endoscopy;  Laterality: N/A;  . COLONOSCOPY WITH PROPOFOL N/A 12/08/2017   Procedure: COLONOSCOPY WITH PROPOFOL;  Surgeon: Anna, Kiran, MD;  Location: [facility] ENDOSCOPY;  Service: Gastroenterology;  Laterality: N/A;  . Dexa  07/2005   OP hip- 3.0  . ESOPHAGOGASTRODUODENOSCOPY (EGD) WITH PROPOFOL N/A 07/01/2016   Procedure: ESOPHAGOGASTRODUODENOSCOPY (EGD) WITH PROPOFOL;  Surgeon: Kiran Anna, MD;  Location: [facility] ENDOSCOPY;  Service: Endoscopy;  Laterality: N/A;  . FLEXIBLE SIGMOIDOSCOPY N/A 07/01/2016   Procedure: FLEXIBLE SIGMOIDOSCOPY;  Surgeon: Kiran Anna, MD;  Location: [facility] ENDOSCOPY;  Service: Endoscopy;  Laterality: N/A;  . LEEP  2005  . Surgery- tendon release in hand  2006  . TUBAL LIGATION      Social History   Socioeconomic History  . Marital   status: Single    Spouse name: Not on file  . Number of children: 3  . Years of education: Not on file  . Highest education level: Not on file  Occupational  History    Employer: [employer]  Tobacco Use  . Smoking status: Former Smoker    Quit date: 2006    Years since quitting: 14.9  . Smokeless tobacco: Never Used  . Tobacco comment: Quit in 8/07  Substance and Sexual Activity  . Alcohol use: No    Alcohol/week: 0.0 standard drinks  . Drug use: No  . Sexual activity: Not Currently  Other Topics Concern  . 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    Social Determinants of Health   Financial Resource Strain:   . Difficulty of Paying Living Expenses: Not on file  Food Insecurity:   . Worried About Charity fundraiser in the Last Year: Not on file  . Ran Out of Food in the Last Year: Not on file  Transportation Needs:   . Lack of Transportation (Medical): Not on file  . Lack of Transportation (Non-Medical): Not on file  Physical Activity:   . Days of Exercise per Week: Not on file  . Minutes of Exercise per Session: Not on file  Stress:   . Feeling of Stress : Not on file  Social Connections:   . Frequency of Communication with Friends and Family: Not on file  . Frequency of Social Gatherings with Friends and Family: Not on file  . Attends Religious Services: Not on file  . Active Member of Clubs or Organizations: Not on file  . Attends Archivist Meetings: Not on file  . Marital Status: Not on file  Intimate Partner Violence:   . Fear of Current or Ex-Partner: Not on file  . Emotionally Abused: Not on file  . Physically Abused: Not on file  . Sexually Abused: Not on file    Family History  Problem Relation Age of Onset  . Heart disease Mother   . Diabetes Mother   . Hypertension Father   . Macular degeneration Father   . Dementia Father        early  . Arthritis Father        RA  . Alcohol abuse Father   . Breast cancer Sister 44     Current Outpatient Medications:  .  albuterol (VENTOLIN HFA) 108 (90 Base) MCG/ACT inhaler, Inhale 2 puffs  into the lungs every 6 (six) hours as needed for wheezing or shortness of breath., Disp: 54 g, Rfl: 1 .  alendronate (FOSAMAX) 70 MG tablet, Take 1 tablet by mouth once a week., Disp: , Rfl:  .  amLODipine (NORVASC) 5 MG tablet, Take 1 tablet (5 mg total) by mouth daily., Disp: 90 tablet, Rfl: 3 .  Aspirin (ADULT ASPIRIN LOW STRENGTH) 81 MG EC tablet, Take 81 mg by mouth daily.  , Disp: , Rfl:  .  etanercept (ENBREL SURECLICK) 50 MG/ML injection, Inject 50 mg into the skin once a week., Disp: , Rfl:  .  hydrochlorothiazide (HYDRODIURIL) 25 MG tablet, Take 1 tablet (25 mg total) by mouth daily., Disp: 90 tablet, Rfl: 3 .  hydroxychloroquine (PLAQUENIL) 200 MG tablet, Take 200 mg by mouth daily., Disp: , Rfl:  .  mesalamine (LIALDA) 1.2 g EC tablet, Take 2 tablets (2.4 g total) by mouth daily with breakfast. (Patient taking  differently: Take 1.2 g by mouth daily with breakfast. ), Disp: 180 tablet, Rfl: 3 .  Multiple Vitamin (MULTIVITAMIN) capsule, Take 1 capsule by mouth daily., Disp: , Rfl:  .  Tiotropium Bromide-Olodaterol (STIOLTO RESPIMAT) 2.5-2.5 MCG/ACT AERS, Inhale 2 puffs into the lungs daily., Disp: 12 g, Rfl: 5 .  ondansetron (ZOFRAN-ODT) 4 MG disintegrating tablet, Take 1 tablet by mouth as needed., Disp: , Rfl:   No results found.  No images are attached to the encounter.   CMP Latest Ref Rng & Units 02/19/2019  Glucose 65 - 99 mg/dL 102(H)  BUN 8 - 27 mg/dL 10  Creatinine 0.57 - 1.00 mg/dL 0.75  Sodium 134 - 144 mmol/L 137  Potassium 3.5 - 5.2 mmol/L 3.7  Chloride 96 - 106 mmol/L 94(L)  CO2 20 - 29 mmol/L 26  Calcium 8.7 - 10.3 mg/dL 10.2  Total Protein 6.0 - 8.5 g/dL 7.6  Total Bilirubin 0.0 - 1.2 mg/dL 0.7  Alkaline Phos 39 - 117 IU/L 55  AST 0 - 40 IU/L 25  ALT 0 - 32 IU/L 20   CBC Latest Ref Rng & Units 06/14/2019  WBC 4.0 - 10.5 K/uL 2.7(L)  Hemoglobin 12.0 - 15.0 g/dL 15.7(H)  Hematocrit 36.0 - 46.0 % 45.2  Platelets 150 - 400 K/uL 193      Observation/objective:appears in no acute distress. Breathing is non labored  Assessment and plan: Patient is a 68 yr old female referred for polycythemia likely secondary to COPD.  Patients hemoglobin has been around 15 but no values beyond 16. JAK 2 negative and EPO levels are normal. She therefore does not have polycythemia vera. Suspect polycythemia secondary to COPD. This can be monitored conservatively without bone marrow biopsy.   Recent cbc also showed wbc 2.7. anc 2.1 mild lymphopenia. Continue to monitor  Follow-up instructions: cbc with diff, b12 and folate in 1 month  Labs and see me in 6 months  I discussed the assessment and treatment plan with the patient. The patient was provided an opportunity to ask questions and all were answered. The patient agreed with the plan and demonstrated an understanding of the instructions.   The patient was advised to call back or seek an in-person evaluation if the symptoms worsen or if the condition fails to improve as anticipated.    Visit Diagnosis: 1. Polycythemia     Dr.  , MD, MPH [facility] Pager- [phone] 06/28/2019 11:24 AM  

## 2019-07-02 ENCOUNTER — Ambulatory Visit (INDEPENDENT_AMBULATORY_CARE_PROVIDER_SITE_OTHER): Payer: Medicare HMO | Admitting: Pulmonary Disease

## 2019-07-02 ENCOUNTER — Other Ambulatory Visit: Payer: Self-pay

## 2019-07-02 ENCOUNTER — Encounter: Payer: Self-pay | Admitting: Pulmonary Disease

## 2019-07-02 VITALS — BP 128/80 | HR 94 | Temp 97.0°F | Ht 65.0 in | Wt 106.4 lb

## 2019-07-02 DIAGNOSIS — J449 Chronic obstructive pulmonary disease, unspecified: Secondary | ICD-10-CM

## 2019-07-02 DIAGNOSIS — M069 Rheumatoid arthritis, unspecified: Secondary | ICD-10-CM | POA: Diagnosis not present

## 2019-07-02 DIAGNOSIS — R0602 Shortness of breath: Secondary | ICD-10-CM

## 2019-07-02 DIAGNOSIS — K5100 Ulcerative (chronic) pancolitis without complications: Secondary | ICD-10-CM

## 2019-07-02 DIAGNOSIS — J9611 Chronic respiratory failure with hypoxia: Secondary | ICD-10-CM

## 2019-07-02 DIAGNOSIS — D751 Secondary polycythemia: Secondary | ICD-10-CM | POA: Diagnosis not present

## 2019-07-02 MED ORDER — AEROCHAMBER MV MISC: 0 refills | Status: DC

## 2019-07-02 MED ORDER — BREZTRI AEROSPHERE 160-9-4.8 MCG/ACT IN AERO: 2.0000 | INHALATION_SPRAY | Freq: Two times a day (BID) | RESPIRATORY_TRACT | 2 refills | Status: DC

## 2019-07-02 NOTE — Patient Instructions (Signed)
1.  I highly recommend that you reconsider the use of oxygen.  You already have thick blood and this will make your heart work harder which in turn will cause more shortness of breath.  2.  We will check your oxygen at nighttime.  3.  We will evaluate your heart to determine if there is any issues with the pressure going from the heart to the lungs (pulmonary hypertension)  4.  We will switch St[employer] to [employer] 2 puffs twice a day.  5.  We will see you in follow-up in 4 to 6 weeks time call sooner should any new difficulties arise.

## 2019-07-02 NOTE — Progress Notes (Signed)
Subjective:    Patient ID: Carrie Hendricks, female    DOB: 05/14/1951, 68 y.o.   MRN: [id number] PULMONARY OFFICE FOLLOW-UP NOTE  Requesting MD/Service: [facility] Date of initial consultation: 02/17/17 Reason for consultation: DOE, COPD  PT PROFILE: 68 y.o. female former smoker referred for evaluation of progressive DOE and history of COPD.  Former patient of Dr. Merton Border, now establishing care with me due to Dr. Lora Havens departure.  Former smoker quit 2006.  DATA: 04/04/17 CXR: Severe hyperinflation and hyperlucency without acute findings 05/18/17 PFTs: Very severe obstruction, FEV1 0.96 L (38%), hyperinflation-TLC 6.92 L (127%), RV 4.45 L (208%), DLCO 4.8 (28%), DLCO/VA 1.35 (37%)  HPI Last visit with Dr. Alva Garnet was 01/29/2019.  At that time she noted that she was only modestly limited by class II dyspnea despite her significant obstruction on PFTs.  Since that visit however, she has had increasing dyspnea since approximately 2019-05-29.  There has been no acute illness precipitating this.  She admits that, if she really thinks about it the onset of dyspnea was gradual.  She has been on nocturnal oxygen but not on oxygen with ambulation or exertion.  She has been noted to have significant polycythemia.  She has been evaluated by hematology for this issue.  She notes that her symptoms started around the time she was placed on sulfasalazine for ulcerative colitis however the sensation of dyspnea never went away even when this medication was switched to Lialda.  She has not had any chest pain, no orthopnea or paroxysmal nocturnal dyspnea.  Recently tested Covid negative on 30 November.  She had a prednisone taper with modest relief of her symptoms.  She has not had PFTs since 2018 PFTs.  No lower extremity edema.  She still remains active working, tries to walk at least a mile to 2 miles per day as able.   Review of Systems A 10 point review of systems was performed and it is as noted  above otherwise negative.    Objective:   Physical Exam BP 128/80 (BP Location: Left Arm, Patient Position: Sitting, Cuff Size: Large)   Pulse 94   Temp (!) 97 F (36.1 C) (Temporal)   Ht 5' 5"  (1.651 m)   Wt 106 lb 6.4 oz (48.3 kg)   SpO2 98% Comment: on RA  BMI 17.71 kg/m   Gen: Thin but not cachectic,  looks mildly uncomfortable today.  No overt tachypnea HEENT: NCAT, sclerae anicteric Neck: No JVD, trachea midline, no crepitus Thorax: Increased AP diameter, Hoover sign present Lungs: breath sounds  distant throughout without wheezes or other adventitious sounds Cardiovascular: RRR, no murmurs Abdomen: Soft, nontender, normal BS Ext: without clubbing, cyanosis.  Trace symmetric pedal edema Neuro: grossly intact Skin: Limited exam, no lesions noted  Ambulatory oximetry was performed.  On room air the patient was noted to have a resting oxygen saturation of 96% during ambulation the patient was noted to have oxygen saturation of 90% on the first 250 feet of ambulation by 750 feet she was at 85%, she was walking at a moderate pace.  On 2 L/min the patient maintained saturations of 94 to 96% with ambulation.    Assessment & Plan:   Very severe COPD Patient will need reassessment of this issue Recommend pulmonary function testing when able owing COVID-19 restrictions Discontinue Stiolto Trial of Breztri, 2 inhalations twice a day Continue albuterol inhaler as needed for increased dyspnea  Shortness of breath Chronic respiratory failure with hypoxia Patient will benefit from  oxygen therapy however she is declining daytime oxygen therapy Patient agrees to continue home oxygen therapy nocturnally Advised her that continued issues with hypoxemia can lead to significant pulmonary hypertension and increased morbidity/mortality Etiology of dyspnea multifactorial Cannot exclude pulmonary hypertension/cor pulmonale, obtain 2D echo  Polycythemia due to hypoxia in the setting of severe  COPD Oxygen supplementation as noted above was recommended Patient declining oxygen during the day Will continue oxygen nocturnally  Rheumatoid arthritis Ulcerative pancolitis On Plaquenil This issue adds complexity to her management Rheumatoid arthritis has been associated with vasculitis Vasculitis can lead to pulmonary hypertension  Follow-up 4 to 6 weeks time, she is to contact us prior to that time should any new difficulties with regards to her breathing arise.  Total visit time 45 minutes.   Renold Don, MD [facility]   This note was dictated using voice recognition software/Dragon.  Despite best efforts to proofread, errors can occur which can change the meaning.  Any change was purely unintentional.

## 2019-07-09 ENCOUNTER — Encounter: Payer: Self-pay | Admitting: Pulmonary Disease

## 2019-07-17 DIAGNOSIS — J449 Chronic obstructive pulmonary disease, unspecified: Secondary | ICD-10-CM | POA: Diagnosis not present

## 2019-07-17 DIAGNOSIS — R0902 Hypoxemia: Secondary | ICD-10-CM | POA: Diagnosis not present

## 2019-07-19 ENCOUNTER — Telehealth: Payer: Self-pay | Admitting: Pulmonary Disease

## 2019-07-19 DIAGNOSIS — J449 Chronic obstructive pulmonary disease, unspecified: Secondary | ICD-10-CM

## 2019-07-19 NOTE — Telephone Encounter (Signed)
ONO reviewed by Dr. Patsey Berthold- recommend 2L QHS.  Lm to make pt aware of results.

## 2019-07-19 NOTE — Telephone Encounter (Signed)
Pt returning call.  [phone].

## 2019-07-19 NOTE — Telephone Encounter (Signed)
Pt is aware of results and voiced her understanding.  Order has been placed, as pt was agreeable to oxygen. Nothing further is needed.

## 2019-07-22 ENCOUNTER — Other Ambulatory Visit: Payer: Medicare HMO

## 2019-07-23 ENCOUNTER — Telehealth: Payer: Self-pay | Admitting: Pulmonary Disease

## 2019-07-23 NOTE — Addendum Note (Signed)
Addended by: Maryanna Shape A on: 07/23/2019 11:27 AM   Modules accepted: Orders

## 2019-07-23 NOTE — Telephone Encounter (Signed)
Order has been corrected. Levada Dy has been made via community message. Nothing further is needed.

## 2019-07-24 ENCOUNTER — Other Ambulatory Visit: Payer: Self-pay

## 2019-07-24 ENCOUNTER — Ambulatory Visit (INDEPENDENT_AMBULATORY_CARE_PROVIDER_SITE_OTHER): Payer: Medicare HMO

## 2019-07-24 DIAGNOSIS — R0602 Shortness of breath: Secondary | ICD-10-CM

## 2019-07-25 ENCOUNTER — Other Ambulatory Visit: Payer: Self-pay

## 2019-07-25 ENCOUNTER — Inpatient Hospital Stay: Payer: Medicare HMO | Attending: Oncology

## 2019-07-25 DIAGNOSIS — D72810 Lymphocytopenia: Secondary | ICD-10-CM

## 2019-07-25 DIAGNOSIS — D751 Secondary polycythemia: Secondary | ICD-10-CM

## 2019-07-25 LAB — VITAMIN B12: Vitamin B-12: 725 pg/mL (ref 180–914)

## 2019-07-25 LAB — CBC WITH DIFFERENTIAL/PLATELET
Abs Immature Granulocytes: 0 10*3/uL (ref 0.00–0.07)
Basophils Absolute: 0 10*3/uL (ref 0.0–0.1)
Basophils Relative: 1 %
Eosinophils Absolute: 0.1 10*3/uL (ref 0.0–0.5)
Eosinophils Relative: 2 %
HCT: 45.3 % (ref 36.0–46.0)
Hemoglobin: 15.2 g/dL — ABNORMAL HIGH (ref 12.0–15.0)
Immature Granulocytes: 0 %
Lymphocytes Relative: 36 %
Lymphs Abs: 1.8 10*3/uL (ref 0.7–4.0)
MCH: 32.4 pg (ref 26.0–34.0)
MCHC: 33.6 g/dL (ref 30.0–36.0)
MCV: 96.6 fL (ref 80.0–100.0)
Monocytes Absolute: 0.5 10*3/uL (ref 0.1–1.0)
Monocytes Relative: 9 %
Neutro Abs: 2.6 10*3/uL (ref 1.7–7.7)
Neutrophils Relative %: 52 %
Platelets: 172 10*3/uL (ref 150–400)
RBC: 4.69 MIL/uL (ref 3.87–5.11)
RDW: 12.1 % (ref 11.5–15.5)
WBC: 5 10*3/uL (ref 4.0–10.5)
nRBC: 0 % (ref 0.0–0.2)

## 2019-07-25 LAB — FOLATE: Folate: 43 ng/mL (ref >5.9)

## 2019-07-31 ENCOUNTER — Encounter: Payer: Self-pay | Admitting: Pulmonary Disease

## 2019-07-31 ENCOUNTER — Other Ambulatory Visit: Payer: Self-pay

## 2019-07-31 ENCOUNTER — Ambulatory Visit: Payer: Medicare HMO | Admitting: Pulmonary Disease

## 2019-07-31 VITALS — BP 132/86 | HR 78 | Temp 96.9°F | Ht 65.5 in | Wt 104.8 lb

## 2019-07-31 DIAGNOSIS — M069 Rheumatoid arthritis, unspecified: Secondary | ICD-10-CM

## 2019-07-31 DIAGNOSIS — R0602 Shortness of breath: Secondary | ICD-10-CM | POA: Diagnosis not present

## 2019-07-31 DIAGNOSIS — J449 Chronic obstructive pulmonary disease, unspecified: Secondary | ICD-10-CM

## 2019-07-31 DIAGNOSIS — D751 Secondary polycythemia: Secondary | ICD-10-CM

## 2019-07-31 DIAGNOSIS — I272 Pulmonary hypertension, unspecified: Secondary | ICD-10-CM | POA: Diagnosis not present

## 2019-07-31 DIAGNOSIS — J9611 Chronic respiratory failure with hypoxia: Secondary | ICD-10-CM

## 2019-07-31 MED ORDER — BREZTRI AEROSPHERE 160-9-4.8 MCG/ACT IN AERO: 2.0000 | INHALATION_SPRAY | Freq: Two times a day (BID) | RESPIRATORY_TRACT | 0 refills | Status: DC

## 2019-07-31 NOTE — Patient Instructions (Signed)
Continue Breztri 2 puffs twice a day   We will try to set up your nighttime oxygen through either [employer] or [employer].   We will follow-up in 3 months time.  Call sooner should any new difficulties arise.

## 2019-07-31 NOTE — Progress Notes (Signed)
Subjective:    Patient ID: Carrie Hendricks, female    DOB: 1950-11-14, 69 y.o.   MRN: [id number]  Requesting MD/Service:Tower Date of initial consultation:02/17/17 by Dr. Alva Garnet Reason for consultation:DOE, COPD  PT PROFILE: 69 y.o.femaleformer smoker referred for evaluation of progressive DOE and history of COPD. Former patient of Dr. Merton Border, now establishing care with me due to Dr. Lora Havens departure.  Former smoker quit 2006.  DATA: 04/04/17 KGS:UPJSRP hyperinflation and hyperlucency without acute findings 05/18/17 PFTs:Very severe obstruction, FEV1 0.96 L (38%), hyperinflation-TLC 6.92 L (127%), RV 4.45 L (208%), DLCO 4.8 (28%), DLCO/VA 1.35 (37%) 07/24/2019: Severe pulmonary hypertension noted, grade 1 diastolic dysfunction noted, LVEF 60 to 65% Had overnight oximetry which shows saturations as low as 78%  HPI First evaluated this patient on 02 July 2019.  Prior patient of Dr. Alva Garnet.  She had noted increasing dyspnea since her last visit with Dr. Alva Garnet in July 2020.  Dyspnea became more marked November 2020.  Previously had been on nocturnal oxygen but not on oxygen with ambulation or exertion.  Ambulatory oximetry showed that she required oxygen.  She however declines daytime oxygen.  She had stopped using nocturnal oxygen previously.  We have not been able to repeat PFTs due to COVID-19 restrictions.  Last PFTs were 2018 as noted above.  She continues to work.  Echocardiogram was performed 24 July 2019 shows severe pulmonary hypertension, diastolic dysfunction and LVEF of 60 to 65%.  She is also followed by hematology for polycythemia likely related to chronic hypoxia.  At her initial visit with me she was started on Breztri 2 inhalations twice a day she has noted marked improvement on this medication.  However she notes that she has only had it for approximately 1-1/2 weeks as her medication delivery was delayed.  She however notes less breathlessness  with ambulation.  We reviewed ambulatory oximetry today she still has issues with desaturation with exercise however she was able to go farther before the saturation was noted she was also less breathless.  Review of Systems A 10 point review of systems was performed and it is as noted above otherwise negative.    Objective:   Physical Exam BP 132/86 (BP Location: Left Arm, Patient Position: Sitting, Cuff Size: Normal)   Pulse 78   Temp (!) 96.9 F (36.1 C) (Temporal)   Ht 5' 5.5" (1.664 m)   Wt 104 lb 12.8 oz (47.5 kg)   SpO2 99% Comment: on ra  BMI 17.17 kg/m   Gen:Thin but not cachectic, looks mildly uncomfortable today.  No overt tachypnea HEENT: NCAT, sclerae anicteric Neck: No JVD, trachea midline, no crepitus Thorax: Increased AP diameter, Hoover's sign present Lungs: breath sounds distant throughout withoutwheezes or other adventitious sounds Cardiovascular: RRR, no murmurs Abdomen: Soft, nontender, normal BS Ext: without clubbing, cyanosis.No pedal edema noted today.   Neuro: grossly intact Skin: Limited exam, no lesions noted      Assessment & Plan:  Very severe COPD Patient will need reassessment of this issue, will schedule PFTs when COVID-19 restrictions lifted Continue Breztri, 2 inhalations twice a day, patient has noted marked improvement on her breathing on this medication Continue albuterol inhaler as needed for increased dyspnea  Shortness of breath Chronic respiratory failure with hypoxia Pulmonary hypertension due to chronic hypoxic vasoconstriction  Patient will benefit from oxygen therapy however she continues to decline daytime oxygen therapy She performed somewhat better with ambulatory oximetry today however, still continues to desaturate Patient agrees to home oxygen  therapy nocturnally, will have to assign to a different company as [employer] failed to deliver her oxygen Advised her that continued issues with hypoxemia can further aggravate  pulmonary hypertension and increased morbidity/mortality  Polycythemia due to hypoxia in the setting of severe COPD Oxygen supplementation as noted above was recommended Patient declining oxygen during the day Will initiate oxygen nocturnally  Rheumatoid arthritis Ulcerative pancolitis On Plaquenil This issue adds complexity to her management Rheumatoid arthritis has been associated with vasculitis Vasculitis can lead to pulmonary hypertension  Follow-up in 3 months time, she is to contact us prior to that time should any new difficulties with regards to her breathing arise.   Renold Don, MD [facility]   *This note was dictated using voice recognition software/Dragon.  Despite best efforts to proofread, errors can occur which can change the meaning.  Any change was purely unintentional.

## 2019-08-02 ENCOUNTER — Telehealth: Payer: Self-pay | Admitting: Pulmonary Disease

## 2019-08-02 NOTE — Telephone Encounter (Signed)
Lm for pt regarding allergy meds.  Suanne Marker, can you cancel referral to [facility]. Pt wished to go with another DME company.  New order was placed on 07/31/2019

## 2019-08-02 NOTE — Telephone Encounter (Signed)
Pt is aware that [employer] order will be canceled.  Pt stated at last OV it was mentioned that she could try an OTC medication to help with allergies. Pt can not recall name of med and would like to know which one Dr. Patsey Berthold recommends.   LG, please advise. Thanks

## 2019-08-02 NOTE — Telephone Encounter (Signed)
For allergies either Allegra OR Claritin is good.

## 2019-08-02 NOTE — Telephone Encounter (Signed)
Pt is aware of recommendations and voiced her understanding.

## 2019-08-05 NOTE — Telephone Encounter (Signed)
LG, please advise.

## 2019-08-05 NOTE — Telephone Encounter (Signed)
Pulmonary rehab referral IF they are doing so ..1950/08/07 not know what they are doing due to COVID

## 2019-08-08 DIAGNOSIS — M0579 Rheumatoid arthritis with rheumatoid factor of multiple sites without organ or systems involvement: Secondary | ICD-10-CM | POA: Diagnosis not present

## 2019-08-08 DIAGNOSIS — M810 Age-related osteoporosis without current pathological fracture: Secondary | ICD-10-CM | POA: Diagnosis not present

## 2019-08-08 DIAGNOSIS — I7300 Raynaud's syndrome without gangrene: Secondary | ICD-10-CM | POA: Diagnosis not present

## 2019-08-08 DIAGNOSIS — M48062 Spinal stenosis, lumbar region with neurogenic claudication: Secondary | ICD-10-CM | POA: Diagnosis not present

## 2019-08-09 ENCOUNTER — Encounter: Payer: Self-pay | Admitting: Family Medicine

## 2019-08-14 ENCOUNTER — Other Ambulatory Visit: Payer: Self-pay

## 2019-08-14 ENCOUNTER — Encounter: Payer: Self-pay | Admitting: Family Medicine

## 2019-08-14 ENCOUNTER — Ambulatory Visit (INDEPENDENT_AMBULATORY_CARE_PROVIDER_SITE_OTHER): Payer: Medicare HMO | Admitting: Family Medicine

## 2019-08-14 VITALS — BP 122/70 | HR 80 | Temp 96.9°F | Ht 65.5 in | Wt 106.1 lb

## 2019-08-14 DIAGNOSIS — K5100 Ulcerative (chronic) pancolitis without complications: Secondary | ICD-10-CM

## 2019-08-14 DIAGNOSIS — R636 Underweight: Secondary | ICD-10-CM | POA: Diagnosis not present

## 2019-08-14 DIAGNOSIS — D582 Other hemoglobinopathies: Secondary | ICD-10-CM | POA: Diagnosis not present

## 2019-08-14 DIAGNOSIS — M069 Rheumatoid arthritis, unspecified: Secondary | ICD-10-CM | POA: Diagnosis not present

## 2019-08-14 DIAGNOSIS — J449 Chronic obstructive pulmonary disease, unspecified: Secondary | ICD-10-CM | POA: Diagnosis not present

## 2019-08-14 DIAGNOSIS — I10 Essential (primary) hypertension: Secondary | ICD-10-CM | POA: Diagnosis not present

## 2019-08-14 MED ORDER — AMLODIPINE BESYLATE 5 MG PO TABS: 5.0000 mg | ORAL_TABLET | Freq: Every day | ORAL | 3 refills | Status: DC

## 2019-08-14 MED ORDER — HYDROCHLOROTHIAZIDE 25 MG PO TABS: 25.0000 mg | ORAL_TABLET | Freq: Every day | ORAL | 3 refills | Status: DC

## 2019-08-14 NOTE — Assessment & Plan Note (Signed)
Encouraged nutrition/regular meals

## 2019-08-14 NOTE — Assessment & Plan Note (Signed)
Pulse ox is 92% RA after walking today  She declines 24 hour 02 but will wear it at night  Continues pulmonary care

## 2019-08-14 NOTE — Assessment & Plan Note (Signed)
bp is much better here than other offices -especially on 2nd check  BP Readings from Last 3 Encounters:  08/14/19 122/70  07/31/19 132/86  07/02/19 128/80   Urged her to check at home when relaxed with arm at heart level Discussed parameters to watch for  Also for pulse ox  (signs of hypoxia) Encouraged healthy habits No med changes Asked pt to request 2nd reading when high at other offices

## 2019-08-14 NOTE — Patient Instructions (Addendum)
If you start checking blood pressure at home-only check it when you are relaxed  Put cuff at heart level  Goal is under 140/90   Keep taking care of yourself  We will continue to watch this   Keep watching your pulse ox also

## 2019-08-14 NOTE — Assessment & Plan Note (Signed)
Continues enbrel  Under care of rheumatology

## 2019-08-14 NOTE — Assessment & Plan Note (Signed)
For eval by hematology this summer  Mild  Hb 15.2 Unsure if related to hypoxia/former smoking/copd

## 2019-08-14 NOTE — Progress Notes (Signed)
Subjective:    Patient ID: Carrie Hendricks, female    DOB: August 26, 1950, 69 y.o.   MRN: [id number]  This visit occurred during the SARS-CoV-2 public health emergency.  Safety protocols were in place, including screening questions prior to the visit, additional usage of staff PPE, and extensive cleaning of exam room while observing appropriate contact time as indicated for disinfecting solutions.    HPI Pt presents for concerns about blood pressure   Wt Readings from Last 3 Encounters:  08/14/19 106 lb 1 oz (48.1 kg)  07/31/19 104 lb 12.8 oz (47.5 kg)  07/02/19 106 lb 6.4 oz (48.3 kg)   17.38 kg/m   H/o HTN BP Readings from Last 3 Encounters:  08/14/19 132/70  07/31/19 132/86  07/02/19 128/80  amlodipine 5 mg daily  hctz 25 mg daily Event better on 2nd check  BP: 122/70    She feels like she gets nervous in other doctor offices  Pulse Readings from Last 3 Encounters:  08/14/19 80  07/31/19 78  07/02/19 94     Also severe copd and RA and UC  Talking about 02 ATC- she does not want to do this Her new inhaler is helping  Pulse ox today 92 % on RA    Takes enbrel and lialda   Pt states at her last 3 doctor visits with specialists her bp has been high  153/93 most recently   Lab Results  Component Value Date   CREATININE 0.75 02/19/2019   BUN 10 02/19/2019   NA 137 02/19/2019   K 3.7 02/19/2019   CL 94 (L) 02/19/2019   CO2 26 02/19/2019   Lab Results  Component Value Date   WBC 5.0 07/25/2019   HGB 15.2 (H) 07/25/2019   HCT 45.3 07/25/2019   MCV 96.6 07/25/2019   PLT 172 07/25/2019  will be seeing hematology for elevated Hb this summer  Used to smoke    Lab Results  Component Value Date   TSH 1.59 08/14/2018    Patient Active Problem List   Diagnosis Date Noted  . Elevated hemoglobin ([place]) 03/08/2019  . COPD, very severe ([place]) 08/15/2018  . Sleep disorder 10/10/2017  . Screening mammogram, encounter for 09/12/2017  . Ulcerative colitis ([place])  02/01/2017  . Lumbar degenerative disc disease 01/20/2017  . Scoliosis 06/28/2016  . Welcome to Medicare preventive visit 06/10/2016  . Estrogen deficiency 06/10/2016  . Constipation 03/02/2016  . Underweight 03/02/2016  . Shortness of breath 05/16/2014  . History of COPD 05/16/2014  . Stress reaction 05/16/2014  . Connective tissue disease ([place]) 03/05/2014  . Pedal edema 10/15/2013  . Pulmonary hypertension ([place]) 10/10/2011  . Encounter for routine gynecological examination 07/11/2011  . Routine general medical examination at a health care facility 07/03/2011  . HEMORRHOIDS, INTERNAL, WITH BLEEDING 03/15/2010  . Essential hypertension 11/16/2006  . Rheumatoid arthritis ([place]) 11/16/2006  . Osteoporosis 11/16/2006   Past Medical History:  Diagnosis Date  . Arthritis    rheumatoid  . Complication of anesthesia   . Hypertension   . Osteoporosis   . PONV (postoperative nausea and vomiting)   . Pulmonary hypertension ([place])   . Rheumatoid arthritis(714.0)    ? scleroderma   Past Surgical History:  Procedure Laterality Date  . colonosc neg  11/08  . COLONOSCOPY WITH PROPOFOL N/A 09/30/2016   Procedure: COLONOSCOPY WITH PROPOFOL;  Surgeon: Jonathon Bellows, MD;  Location: [facility] ENDOSCOPY;  Service: Endoscopy;  Laterality: N/A;  . COLONOSCOPY WITH PROPOFOL N/A 12/08/2017  Procedure: COLONOSCOPY WITH PROPOFOL;  Surgeon: Jonathon Bellows, MD;  Location: [facility] ENDOSCOPY;  Service: Gastroenterology;  Laterality: N/A;  . Dexa  07/2005   OP hip- 3.0  . ESOPHAGOGASTRODUODENOSCOPY (EGD) WITH PROPOFOL N/A 07/01/2016   Procedure: ESOPHAGOGASTRODUODENOSCOPY (EGD) WITH PROPOFOL;  Surgeon: Jonathon Bellows, MD;  Location: [facility] ENDOSCOPY;  Service: Endoscopy;  Laterality: N/A;  . FLEXIBLE SIGMOIDOSCOPY N/A 07/01/2016   Procedure: FLEXIBLE SIGMOIDOSCOPY;  Surgeon: Jonathon Bellows, MD;  Location: [facility] ENDOSCOPY;  Service: Endoscopy;  Laterality: N/A;  . LEEP  2005  . Surgery- tendon release in hand  2006  . TUBAL  LIGATION     Social History   Tobacco Use  . Smoking status: Former Smoker    Packs/day: 1.25    Years: 30.00    Pack years: 72.50    Quit date: 2006    Years since quitting: 15.1  . Smokeless tobacco: Never Used  . Tobacco comment: Quit in 8/07  Substance Use Topics  . Alcohol use: No    Alcohol/week: 0.0 standard drinks  . Drug use: No   Family History  Problem Relation Age of Onset  . Heart disease Mother   . Diabetes Mother   . Hypertension Father   . Macular degeneration Father   . Dementia Father        early  . Arthritis Father        RA  . Alcohol abuse Father   . Breast cancer Sister 39   No Known Allergies Current Outpatient Medications on File Prior to Visit  Medication Sig Dispense Refill  . albuterol (VENTOLIN HFA) 108 (90 Base) MCG/ACT inhaler Inhale 2 puffs into the lungs every 6 (six) hours as needed for wheezing or shortness of breath. 54 g 1  . alendronate (FOSAMAX) 70 MG tablet Take 1 tablet by mouth once a week.    . Aspirin (ADULT ASPIRIN LOW STRENGTH) 81 MG EC tablet Take 81 mg by mouth daily.      . Budeson-Glycopyrrol-Formoterol (BREZTRI AEROSPHERE) 160-9-4.8 MCG/ACT AERO Inhale 2 puffs into the lungs 2 (two) times daily. 5.9 g 0  . etanercept (ENBREL SURECLICK) 50 MG/ML injection Inject 50 mg into the skin once a week.    . hydroxychloroquine (PLAQUENIL) 200 MG tablet Take 200 mg by mouth daily.    . mesalamine (LIALDA) 1.2 g EC tablet Take 2 tablets (2.4 g total) by mouth daily with breakfast. (Patient taking differently: Take 1.2 g by mouth daily with breakfast. ) 180 tablet 3  . Multiple Vitamin (MULTIVITAMIN) capsule Take 1 capsule by mouth daily.    Marland Kitchen Spacer/Aero-Holding Chambers (AEROCHAMBER MV) inhaler Use as instructed 1 each 0   No current facility-administered medications on file prior to visit.    Review of Systems  Constitutional: Negative for activity change, appetite change, fatigue, fever and unexpected weight change.  HENT:  Negative for congestion, ear pain, rhinorrhea, sinus pressure and sore throat.   Eyes: Negative for pain, redness and visual disturbance.  Respiratory: Positive for shortness of breath. Negative for cough and wheezing.   Cardiovascular: Negative for chest pain and palpitations.  Gastrointestinal: Negative for abdominal pain, blood in stool, constipation and diarrhea.  Endocrine: Negative for polydipsia and polyuria.  Genitourinary: Negative for dysuria, frequency and urgency.  Musculoskeletal: Positive for arthralgias. Negative for back pain and myalgias.  Skin: Negative for pallor and rash.  Allergic/Immunologic: Negative for environmental allergies.  Neurological: Negative for dizziness, syncope, facial asymmetry, weakness and headaches.  Hematological: Negative for adenopathy. Does not bruise/bleed  easily.  Psychiatric/Behavioral: Negative for decreased concentration and dysphoric mood. The patient is not nervous/anxious.        Objective:   Physical Exam Constitutional:      General: She is not in acute distress.    Appearance: Normal appearance. She is not ill-appearing.     Comments: Underweight  Well appearing  HENT:     Head: Normocephalic and atraumatic.     Mouth/Throat:     Mouth: Mucous membranes are moist.     Pharynx: Oropharynx is clear.  Eyes:     General: No scleral icterus.    Extraocular Movements: Extraocular movements intact.     Conjunctiva/sclera: Conjunctivae normal.     Pupils: Pupils are equal, round, and reactive to light.  Neck:     Vascular: No carotid bruit.  Cardiovascular:     Rate and Rhythm: Normal rate and regular rhythm.  Pulmonary:     Effort: Pulmonary effort is normal. No respiratory distress.     Breath sounds: No stridor. No wheezing.     Comments: Diffusely distant bs No wheezing Not sob with speech Musculoskeletal:     Cervical back: Normal range of motion and neck supple. No rigidity.  Lymphadenopathy:     Cervical: No cervical  adenopathy.  Skin:    General: Skin is warm and dry.     Coloration: Skin is not pale.     Findings: No erythema or rash.  Neurological:     Mental Status: She is alert. Mental status is at baseline.     Sensory: No sensory deficit.     Coordination: Coordination normal.  Psychiatric:        Mood and Affect: Mood normal.     Comments: Pleasant  Not seemingly anxious today           Assessment & Plan:   Problem List Items Addressed This Visit      Cardiovascular and Mediastinum   Essential hypertension - Primary    bp is much better here than other offices -especially on 2nd check  BP Readings from Last 3 Encounters:  08/14/19 122/70  07/31/19 132/86  07/02/19 128/80   Urged her to check at home when relaxed with arm at heart level Discussed parameters to watch for  Also for pulse ox  (signs of hypoxia) Encouraged healthy habits No med changes Asked pt to request 2nd reading when high at other offices        Relevant Medications   amLODipine (NORVASC) 5 MG tablet   hydrochlorothiazide (HYDRODIURIL) 25 MG tablet     Respiratory   COPD, very severe (HCC)    Pulse ox is 92% RA after walking today  She declines 24 hour 02 but will wear it at night  Continues pulmonary care        Digestive   Ulcerative colitis ([place])    Continues lialda No clinical changes Continues GI f/u        Musculoskeletal and Integument   Rheumatoid arthritis ([place])    Continues enbrel  Under care of rheumatology        Other   Underweight    Encouraged nutrition/regular meals      Elevated hemoglobin (HCC)    For eval by hematology this summer  Mild  Hb 15.2 Unsure if related to hypoxia/former smoking/copd

## 2019-08-14 NOTE — Assessment & Plan Note (Signed)
Continues lialda No clinical changes Continues GI f/u

## 2019-08-16 DIAGNOSIS — J449 Chronic obstructive pulmonary disease, unspecified: Secondary | ICD-10-CM | POA: Diagnosis not present

## 2019-08-29 ENCOUNTER — Other Ambulatory Visit: Payer: Self-pay

## 2019-08-29 ENCOUNTER — Ambulatory Visit: Payer: Medicare HMO | Admitting: Gastroenterology

## 2019-08-29 VITALS — BP 141/82 | HR 86 | Temp 98.2°F | Ht 65.0 in | Wt 106.8 lb

## 2019-08-29 DIAGNOSIS — K5190 Ulcerative colitis, unspecified, without complications: Secondary | ICD-10-CM | POA: Diagnosis not present

## 2019-08-29 NOTE — Progress Notes (Signed)
Jonathon Bellows MD, MRCP(U.K) [address]  [place]  [place], [place] [address]  Main: [phone]  Fax: [phone]   Primary Care Physician: Tower, Wynelle Fanny, MD  Primary Gastroenterologist:  Dr. Jonathon Bellows    Follow-up of ulcerative colitis.  [name] is a 69 y.o. female    Summary of history :  Is here to follow-up for ulcerative colitis.  I have been seeing her since 2017.  At that time she was on long-term NSAID use.  She had been having longstanding abdominal pains and having rectal bleeding.  EGD 06/2016: biopsies showed active chronic gastritis with H pylori , marked duodenitis , normal esophageal biopsies.H pylori stool antigen is negative 08/16/16   Colonoscopy 06/2016 : unable to perform colonoscopy as she had severe colitis on the left side of the colon - we had to abort the procedure at the 30 cm mark.Biopsies confirmed Active colitis with chronicity   Colonoscopy :  3/3/0/18 and bx of the terminal ileum was normal but biopsies of the ascending colon , transverse , descending, sigmoid and rectum showed some areas of inflammation and areas with chronic inflammation changes.   Colonoscopy 12/2017 -single adenoma resected . Colon bx throughout the colon showed no active colitis.  07/2016: DEXA scan normal immune to hepatitis a and B.  Interval history 02/19/2019-08/29/2019  She had been doing well with Apriso daily.  Due to cost was changed to sulfasalazine 2 tablets a day.  07/25/2019: B12 normal, hemoglobin 15.2, folate 43. Last visit her hemoglobin is about normal for her referred to see hematology.  Was seen and evaluated by Dr. 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  Was taking 2 tablets of sulfasalazine but it caused the diarrhea and reduce it to 1 and doing fine.  Has 1 bowel movement a day.  At times has 2 bowel movement today.  No abdominal pain, no cramping, no rectal bleeding, has had 1 dose of her Covid  vaccine and due for the second 1.  No other complaints.   Current Outpatient Medications  Medication Sig Dispense Refill  . albuterol (VENTOLIN HFA) 108 (90 Base) MCG/ACT inhaler Inhale 2 puffs into the lungs every 6 (six) hours as needed for wheezing or shortness of breath. 54 g 1  . alendronate (FOSAMAX) 70 MG tablet Take 1 tablet by mouth once a week.    Marland Kitchen amLODipine (NORVASC) 5 MG tablet Take 1 tablet (5 mg total) by mouth daily. 90 tablet 3  . Aspirin (ADULT ASPIRIN LOW STRENGTH) 81 MG EC tablet Take 81 mg by mouth daily.      . Budeson-Glycopyrrol-Formoterol (BREZTRI AEROSPHERE) 160-9-4.8 MCG/ACT AERO Inhale 2 puffs into the lungs 2 (two) times daily. 5.9 g 0  . etanercept (ENBREL SURECLICK) 50 MG/ML injection Inject 50 mg into the skin once a week.    . hydrochlorothiazide (HYDRODIURIL) 25 MG tablet Take 1 tablet (25 mg total) by mouth daily. 90 tablet 3  . hydroxychloroquine (PLAQUENIL) 200 MG tablet Take 200 mg by mouth daily.    . mesalamine (LIALDA) 1.2 g EC tablet Take 2 tablets (2.4 g total) by mouth daily with breakfast. (Patient taking differently: Take 1.2 g by mouth daily with breakfast. ) 180 tablet 3  . Multiple Vitamin (MULTIVITAMIN) capsule Take 1 capsule by mouth daily.    Marland Kitchen Spacer/Aero-Holding Chambers (AEROCHAMBER MV) inhaler Use as instructed 1 each 0   No current facility-administered medications for this visit.  Allergies as of 08/29/2019  . (No Known Allergies)    ROS:  General: Negative for anorexia, weight loss, fever, chills, fatigue, weakness. ENT: Negative for hoarseness, difficulty swallowing , nasal congestion. CV: Negative for chest pain, angina, palpitations, dyspnea on exertion, peripheral edema.  Respiratory: Negative for dyspnea at rest, dyspnea on exertion, cough, sputum, wheezing.  GI: See history of present illness. GU:  Negative for dysuria, hematuria, urinary incontinence, urinary frequency, nocturnal urination.  Endo: Negative for  unusual weight change.    Physical Examination:   There were no vitals taken for this visit.  General: Well-nourished, well-developed in no acute distress.  Eyes: No icterus. Conjunctivae pink. Neuro: Alert and oriented x 3.  Grossly intact. Skin: Warm and dry, no jaundice.   Psych: Alert and cooperative, normal mood and affect.   Imaging Studies: No results found.  Assessment and Plan:   Carrie Hendricks is a 69 y.o. y/o female here to follow up forUlcerativecolitis:. Doing well on sulfasalazineone tablet daily.Clinically, endoscopically and biochemicalin remission. Immune to hepatitis A and B  Labs  1. CBC to be checked in 6 months time. 2.  Continue sulfasalazine  Dr Jonathon Bellows  MD,MRCP [facility]) Follow up in 1 year

## 2019-08-30 ENCOUNTER — Other Ambulatory Visit: Payer: Self-pay

## 2019-08-30 MED ORDER — MESALAMINE 1.2 G PO TBEC: 1.2000 g | DELAYED_RELEASE_TABLET | Freq: Every day | ORAL | 3 refills | Status: DC

## 2019-09-12 ENCOUNTER — Telehealth: Payer: Self-pay | Admitting: Family Medicine

## 2019-09-12 ENCOUNTER — Telehealth: Payer: Self-pay | Admitting: Pulmonary Disease

## 2019-09-12 NOTE — Telephone Encounter (Signed)
She has been doing well with Judithann Sauger previously.  If she feels that this is related to it we can try switching her to Trelegy if this is covered by her insurance.

## 2019-09-12 NOTE — Telephone Encounter (Signed)
Called and spoke to pt, who reports of voice hoariness and non prod cough x2-3wk. She feels that sx have worsen since starting Breztri.  Pt is rinsing her mouth after each use.  Denied additional symptoms.  LG please advise. Thanks

## 2019-09-12 NOTE — Chronic Care Management (AMB) (Signed)
°  Chronic Care Management   Note  09/12/2019 Name: Carrie Hendricks MRN: [id number] DOB: 06-21-1951  Carrie Hendricks is a 69 y.o. year old female who is a primary care patient of Tower, Wynelle Fanny, MD. I reached out to [name] by phone today in response to a referral sent by Ms. [place]'s PCP, Tower, Wynelle Fanny, MD.   Ms. Birkhead was given information about Chronic Care Management services today including:  1. CCM service includes personalized support from designated clinical staff supervised by her physician, including individualized plan of care and coordination with other care providers 2. 24/7 contact phone numbers for assistance for urgent and routine care needs. 3. Service will only be billed when office clinical staff spend 20 minutes or more in a month to coordinate care. 4. Only one practitioner may furnish and bill the service in a calendar month. 5. The patient may stop CCM services at any time (effective at the end of the month) by phone call to the office staff.   Patient agreed to services and verbal consent obtained.   Follow up plan:   Raynicia Dukes [employer] Scheduler

## 2019-09-12 NOTE — Progress Notes (Signed)
  Chronic Care Management   Outreach Note  09/12/2019 Name: Carrie Hendricks MRN: [id number] DOB: 07-Sep-1950  Referred by: Tower, Wynelle Fanny, MD Reason for referral : No chief complaint on file.   An unsuccessful telephone outreach was attempted today. The patient was referred to the pharmacist for assistance with care management and care coordination.   Follow Up Plan:   Raynicia Dukes [employer] Scheduler

## 2019-09-12 NOTE — Telephone Encounter (Signed)
Called and spoke to pt and relayed below message.  Pt stated that she would like research the price of trelegy and will call back on 09/13/2019 with update.

## 2019-09-13 DIAGNOSIS — J449 Chronic obstructive pulmonary disease, unspecified: Secondary | ICD-10-CM | POA: Diagnosis not present

## 2019-09-13 NOTE — Telephone Encounter (Signed)
Calling to see if it is ok to stay on the same inhaler and take every other day 1x/daily and 2x/daily every other day> Pt can be reached at [phone].

## 2019-09-13 NOTE — Telephone Encounter (Signed)
Spoke to pt, who is questioning if she can continue [place] but take it every other day verses daily.  Dr. Patsey Berthold please advise. thanks

## 2019-09-13 NOTE — Telephone Encounter (Signed)
That would not do.

## 2019-09-16 NOTE — Telephone Encounter (Signed)
Lm for pt

## 2019-09-23 ENCOUNTER — Telehealth: Payer: Self-pay

## 2019-09-23 DIAGNOSIS — I10 Essential (primary) hypertension: Secondary | ICD-10-CM

## 2019-09-23 DIAGNOSIS — J449 Chronic obstructive pulmonary disease, unspecified: Secondary | ICD-10-CM

## 2019-09-23 NOTE — Telephone Encounter (Signed)
LMTCB x 1 

## 2019-09-23 NOTE — Telephone Encounter (Signed)
Unfortunately she would be underdosing significantly.  She could try it but I do not think that it is going to be effective.  We have offered to fill alternative medications.

## 2019-09-23 NOTE — Telephone Encounter (Signed)
Spoke with the pt.  I advised that Dr Patsey Berthold does not recommend that she take med every other day.  She states this is not what she was asking.  She wants to take med daily, just lessen the puffs that she takes and wants to know if this would be okay.  She likes the medication bc it helps with her breathing, but despite rinsing her mouth it has made her voice change and her voice is hoarse. Please advise, thanks!

## 2019-09-23 NOTE — Telephone Encounter (Signed)
ATC patient unable to reach LM to call back office (x2) 

## 2019-09-23 NOTE — Telephone Encounter (Signed)
I would like to request a referral for Carrie Hendricks to chronic care management pharmacy services for the following conditions:   Essential hypertension, benign  [I10]  COPD [J44.9]  Debbora Dus, PharmD Clinical Pharmacist [facility] [phone]

## 2019-09-24 NOTE — Telephone Encounter (Signed)
LMTCB x2  

## 2019-09-25 NOTE — Telephone Encounter (Signed)
LMTCB x 3 

## 2019-09-26 NOTE — Telephone Encounter (Signed)
ATC patient once more on all of her numbers listed and have not been able to reach her. I will forward to Dr. Patsey Berthold  as an Juluis Rainier

## 2019-09-27 ENCOUNTER — Other Ambulatory Visit: Payer: Self-pay

## 2019-09-27 ENCOUNTER — Ambulatory Visit: Payer: Medicare HMO

## 2019-09-27 DIAGNOSIS — Z8709 Personal history of other diseases of the respiratory system: Secondary | ICD-10-CM

## 2019-09-27 DIAGNOSIS — M069 Rheumatoid arthritis, unspecified: Secondary | ICD-10-CM

## 2019-09-27 DIAGNOSIS — G479 Sleep disorder, unspecified: Secondary | ICD-10-CM

## 2019-09-27 DIAGNOSIS — I272 Pulmonary hypertension, unspecified: Secondary | ICD-10-CM

## 2019-09-27 DIAGNOSIS — J449 Chronic obstructive pulmonary disease, unspecified: Secondary | ICD-10-CM

## 2019-09-27 DIAGNOSIS — M810 Age-related osteoporosis without current pathological fracture: Secondary | ICD-10-CM

## 2019-09-27 DIAGNOSIS — K5100 Ulcerative (chronic) pancolitis without complications: Secondary | ICD-10-CM

## 2019-09-27 DIAGNOSIS — I10 Essential (primary) hypertension: Secondary | ICD-10-CM

## 2019-09-27 NOTE — Chronic Care Management (AMB) (Signed)
Chronic Care Management Pharmacy  Name: Carrie Hendricks  MRN: [id number] DOB: February 14, 1951  Chief Complaint/ HPI  Carrie Hendricks,  69 y.o., female presents for their Initial CCM visit with the clinical pharmacist via telephone.  PCP : Carrie Greenspan, MD  Their chronic conditions include: hypertension, COPD, ulcerative colitis, RA, osteoporosis  Patient concerns:   Concerns about new inhaler; feels voice is changing even though she uses spacer, rinses mouth out after each use, wears oxygen at night, has talked to pulmonology about reducing to 1 puff daily due to voice change, constant mucous in throat  Concerned about BP as elevated in clinic recently   Office Visits:  08/14/19: Carrie Hendricks - encouraged home BP monitoring  Consult Visit:  08/29/19: Ulcerative colitis - continue sulfasalazine tablet daily (not on sulfasalazine, on mesalamine per patient)  08/08/19: Rheumatoid arthritis   07/31/19: COPD - started oxygen at night   No Known Allergies  Medications: Outpatient Encounter Medications as of 09/27/2019  Medication Sig  . albuterol (VENTOLIN HFA) 108 (90 Base) MCG/ACT inhaler Inhale 2 puffs into the lungs every 6 (six) hours as needed for wheezing or shortness of breath.  Marland Kitchen alendronate (FOSAMAX) 70 MG tablet Take 1 tablet by mouth once a week.  Marland Kitchen amLODipine (NORVASC) 5 MG tablet Take 1 tablet (5 mg total) by mouth daily.  . Aspirin (ADULT ASPIRIN LOW STRENGTH) 81 MG EC tablet Take 81 mg by mouth daily.    . Budeson-Glycopyrrol-Formoterol (BREZTRI AEROSPHERE) 160-9-4.8 MCG/ACT AERO Inhale 2 puffs into the lungs 2 (two) times daily.  Marland Kitchen etanercept (ENBREL SURECLICK) 50 MG/ML injection Inject 50 mg into the skin once a week.  . hydrochlorothiazide (HYDRODIURIL) 25 MG tablet Take 1 tablet (25 mg total) by mouth daily.  . hydroxychloroquine (PLAQUENIL) 200 MG tablet Take 200 mg by mouth daily.  . mesalamine (LIALDA) 1.2 g EC tablet Take 1 tablet (1.2 g total) by mouth daily with  breakfast.  . Multiple Vitamin (MULTIVITAMIN) capsule Take 1 capsule by mouth daily.  Marland Kitchen Spacer/Aero-Holding Chambers (AEROCHAMBER MV) inhaler Use as instructed   No facility-administered encounter medications on file as of 09/27/2019.   Current Diagnosis/Assessment: Goals    . Patient Stated     Starting 08/14/2018, I will continue to drink 6-8 glasses of water daily.     Marland Kitchen Pharmacy Care Plan     CARE PLAN ENTRY  Current Barriers:  . Chronic Disease Management support, education, and care coordination needs related to hypertension, COPD, ulcerative colitis, RA, osteoporosis  Pharmacist Clinical Goal(s):  Marland Kitchen Maintain blood pressure within goal of less than 140/90 mmHg. Begin checking home blood pressure before morning medications. Send MyChart message or call with readings.  . Reduce cost burden and improve adverse effects with Breztri. Consult with pulmonology for alternative.   Interventions: . Comprehensive medication review performed.  Patient Self Care Activities:  . Self administers medications as prescribed  Initial goal documentation      Hypertension   CMP Latest Ref Rng & Units 02/19/2019 08/14/2018 04/24/2018  Glucose 65 - 99 mg/dL 102(H) 106(H) 86  BUN 8 - 27 mg/dL 10 13 11   Creatinine 0.57 - 1.00 mg/dL 0.75 0.80 0.81  Sodium 134 - 144 mmol/L 137 137 139  Potassium 3.5 - 5.2 mmol/L 3.7 3.4(L) 3.4(L)  Chloride 96 - 106 mmol/L 94(L) 97 100  CO2 20 - 29 mmol/L 26 31 33(H)  Calcium 8.7 - 10.3 mg/dL 10.2 9.8 9.6  Total Protein 6.0 - 8.5 g/dL 7.6 7.3  7.1  Total Bilirubin 0.0 - 1.2 mg/dL 0.7 0.6 0.7  Alkaline Phos 39 - 117 IU/L 55 49 43  AST 0 - 40 IU/L 25 23 23   ALT 0 - 32 IU/L 20 22 21    Office blood pressures are: BP Readings from Last 3 Encounters:  08/29/19 (!) 141/82  08/14/19 122/70  07/31/19 132/86   BP goal < 140/90 mmHg Patient has failed these meds in the past: none Patient checks BP at home: has monitor with arm cuff, but wasn't sure what number to  set it to; discussed setting to higher setting until able to establish normal BP, then may reduce setting to 150 mmHg Patient home BP readings are ranging: none reported   Patient is currently controlled on the following medications:   Amlodipine 5 mg - 1 tablet daily  Hydrochlorothiazide 25 mg - 1 tablet daily  We discussed: pt plans to begin checking BP now that she understands settings, will send a mychart message with BP readings   Plan: Continue current medications; Begin home monitoring blood pressure.   COPD / Tobacco   Managed by Dr. Patsey Berthold  Last spirometry score: 05/2017 38% FEV1 Gold Grade: Gold 3 (FEV1 30-49%)  Eosinophil count:   Lab Results  Component Value Date/Time   EOSPCT 2 07/25/2019 02:43 PM  %                               Eos (Absolute):  Lab Results  Component Value Date/Time   EOSABS 0.1 07/25/2019 02:43 PM   EOSABS 0.1 02/19/2019 03:17 PM   Tobacco Status:  Social History   Tobacco Use  Smoking Status Former Smoker  . Packs/day: 1.25  . Years: 30.00  . Pack years: 37.50  . Quit date: 2006  . Years since quitting: 15.2  Smokeless Tobacco Never Used  Tobacco Comment   Quit in 8/07   Patient has failed these meds in past: none  Patient is currently controlled on the following medications:   Breztri 160-9-4.8 mcg/act - 2 puffs BID  Albuterol 90 mcg - 2 puffs every 6 hours PRN  Supplemental Oxygen at night   Using maintenance inhaler regularly? Yes Frequency of rescue inhaler use:  several times per month Technique: uses spacer  We discussed: previously on Stiolto, switched to [facility] for better control, breathing improved, but noticed voice changes and needing to clear throat all the time, denies acid reflux symptoms; confirms appropriate rinsing afterwards; also concerned about cost and asked about decreasing to 2 puffs once daily  Plan: Continue current medications. Consult with pulmonary regarding adverse effect/cost.   Rheumatoid  Arthritis    Followed by Dr. Jefm Bryant  Patient has failed these meds in past: none Patient is currently controlled on the following medications:   Hydroxychloroquine 200 mg - 1 tablet daily  Enbrel 50 mg/mL injection - once weekly (self-injection)  Plan: Continue current medications  Osteoporosis    Followed by Dr. Jefm Bryant  Patient has failed these meds in past: none Patient is currently controlled on the following medications:   Alendronate 70 mg - 1 tablet weekly  We discussed: after 4-6 years of Fosamax, stopped it for about a year, started back August 2020, has DEXA scan annually, specialist would like her to stay on alendronate  Plan: Continue current medications   Ulcerative Colitis   Patient has failed these meds in past: sulfasalazine (bloating); did not tolerate BID mesalamine Patient is currently  controlled on the following medications:   Mesalamine 1.2 g EC tablet - 1 tablet daily with breakfast   Plan: Continue current medications  Vaccines   Reviewed and discussed patient's vaccination history.    Immunization History  Administered Date(s) Administered  . Fluad Quad(high Dose 65+) 03/08/2019  . Hep A / Hep B 11/03/2016, 12/06/2016  . Hepatitis B, adult 05/09/2017  . Influenza Split 04/03/2011  . Influenza Whole 04/03/2010  . Influenza, High Dose Seasonal PF 04/08/2016, 03/31/2017, 04/21/2018  . Influenza,inj,Quad PF,6+ Mos 03/05/2014  . Influenza-Unspecified 04/03/2013, 03/05/2014, 05/03/2015  . Pneumococcal Conjugate-13 06/10/2016  . Pneumococcal Polysaccharide-23 03/28/2007, 10/15/2013, 08/15/2018  . Td 06/11/2010   Plan: Received both COVID-19 vaccines; Reports Dr. Jefm Bryant did not recommend Shingrix.   Medication Management  OTCs: multivitamin, aspirin 81 mg daily   Pharmacy/Benefits: [employer]/Mail order  Adherence: no concerns   Affordability: Breztri - $500 for 90 DS, gets Enbrel through patient assistance program   CCM Follow Up:  Friday, 01/17/20 at 2:30 PM (telephone)  Debbora Dus, PharmD Clinical Pharmacist [facility] at [facility] [phone]

## 2019-10-02 NOTE — Patient Instructions (Addendum)
Dear Carrie Hendricks,  It was a pleasure meeting you during our initial appointment on September 27, 2019. Below is a summary of the goals we discussed and components of chronic care management. Please contact me anytime with questions or concerns.   Visit Information  Goals Addressed            This Visit's Progress   . Pharmacy Care Plan       CARE PLAN ENTRY  Current Barriers:  . Chronic Disease Management support, education, and care coordination needs related to hypertension, COPD, ulcerative colitis, RA, osteoporosis  Pharmacist Clinical Goal(s):  Marland Kitchen Maintain blood pressure within goal of less than 140/90 mmHg. Begin checking home blood pressure before morning medications. Send MyChart message or call with readings.  . Reduce cost burden and improve adverse effects with Breztri. Consult with pulmonology for alternative.   Interventions: . Comprehensive medication review performed.  Patient Self Care Activities:  . Self administers medications as prescribed  Initial goal documentation       Carrie Hendricks was given information about Chronic Care Management services today including:  1. CCM service includes personalized support from designated clinical staff supervised by her physician, including individualized plan of care and coordination with other care providers 2. 24/7 contact phone numbers for assistance for urgent and routine care needs. 3. Standard insurance, coinsurance, copays and deductibles apply for chronic care management only during months in which we provide at least 20 minutes of these services. Most insurances cover these services at 100%, however patients may be responsible for any copay, coinsurance and/or deductible if applicable. This service may help you avoid the need for more expensive face-to-face services. 4. Only one practitioner may furnish and bill the service in a calendar month. 5. The patient may stop CCM services at any time (effective at the end  of the month) by phone call to the office staff.  Patient agreed to services and verbal consent obtained.   The patient verbalized understanding of instructions provided today and agreed to receive a mailed copy of patient instruction and/or educational materials. Telephone follow up appointment with pharmacy team member scheduled for: Friday, 01/17/20 at 2:30 PM  Debbora Dus, PharmD Clinical Pharmacist [facility] at [facility] [phone]   How to Take Your Blood Pressure Blood pressure is a measurement of how strongly your blood is pressing against the walls of your arteries. Arteries are blood vessels that carry blood from your heart throughout your body. Your health care provider takes your blood pressure at each office visit. You can also take your own blood pressure at home with a blood pressure machine. You may need to take your own blood pressure:  To confirm a diagnosis of high blood pressure (hypertension).  To monitor your blood pressure over time.  To make sure your blood pressure medicine is working. Supplies needed: To take your blood pressure, you will need a blood pressure machine. You can buy a blood pressure machine, or blood pressure monitor, at most drugstores or online. There are several types of home blood pressure monitors. When choosing one, consider the following:  Choose a monitor that has an arm cuff.  Choose a cuff that wraps snugly around your upper arm. You should be able to fit only one finger between your arm and the cuff.  Do not choose a monitor that measures your blood pressure from your wrist or finger. Your health care provider can suggest a reliable monitor that will meet your needs. How  to prepare To get the most accurate reading, avoid the following for 30 minutes before you check your blood pressure:  Drinking caffeine.  Drinking alcohol.  Eating.  Smoking.  Exercising. Five minutes before you check your blood  pressure:  Empty your bladder.  Sit quietly without talking in a dining chair, rather than in a soft couch or armchair. How to take your blood pressure To check your blood pressure, follow the instructions in the manual that came with your blood pressure monitor. If you have a digital blood pressure monitor, the instructions may be as follows: 1. Sit up straight. 2. Place your feet on the floor. Do not cross your ankles or legs. 3. Rest your left arm at the level of your heart on a table or desk or on the arm of a chair. 4. Pull up your shirt sleeve. 5. Wrap the blood pressure cuff around the upper part of your left arm, 1 inch (2.5 cm) above your elbow. It is best to wrap the cuff around bare skin. 6. Fit the cuff snugly around your arm. You should be able to place only one finger between the cuff and your arm. 7. Position the cord inside the groove of your elbow. 8. Press the power button. 9. Sit quietly while the cuff inflates and deflates. 10. Read the digital reading on the monitor screen and write it down (record it). 11. Wait 2-3 minutes, then repeat the steps, starting at step 1. What does my blood pressure reading mean? A blood pressure reading consists of a higher number over a lower number. Ideally, your blood pressure should be below 120/80. The first ("top") number is called the systolic pressure. It is a measure of the pressure in your arteries as your heart beats. The second ("bottom") number is called the diastolic pressure. It is a measure of the pressure in your arteries as the heart relaxes. Blood pressure is classified into four stages. The following are the stages for adults who do not have a short-term serious illness or a chronic condition. Systolic pressure and diastolic pressure are measured in a unit called mm Hg. Normal  Systolic pressure: below 924.  Diastolic pressure: below 80. Elevated  Systolic pressure: 268-341.  Diastolic pressure: below  80. Hypertension stage 1  Systolic pressure: 962-229.  Diastolic pressure: 79-89. Hypertension stage 2  Systolic pressure: 211 or above.  Diastolic pressure: 90 or above. You can have prehypertension or hypertension even if only the systolic or only the diastolic number in your reading is higher than normal. Follow these instructions at home:  Check your blood pressure as often as recommended by your health care provider.  Take your monitor to the next appointment with your health care provider to make sure: ? That you are using it correctly. ? That it provides accurate readings.  Be sure you understand what your goal blood pressure numbers are.  Tell your health care provider if you are having any side effects from blood pressure medicine. Contact a health care provider if:  Your blood pressure is consistently high. Get help right away if:  Your systolic blood pressure is higher than 180.  Your diastolic blood pressure is higher than 110. This information is not intended to replace advice given to you by your health care provider. Make sure you discuss any questions you have with your health care provider. Document Revised: 06/02/2017 Document Reviewed: 11/27/2015 Elsevier Patient Education  2020 [employer].

## 2019-10-04 NOTE — Telephone Encounter (Signed)
Noted  

## 2019-10-08 ENCOUNTER — Telehealth: Payer: Self-pay

## 2019-10-08 NOTE — Telephone Encounter (Signed)
Consult with pulmonology regarding CCM visit on 09/27/19:  Carrie Hendricks is still experiencing hoarseness and mucous in throat with Breztri. She is also concerned about cost. Reports price is unaffordable. She continues to have less shortness of breath with Breztri and is feeling better overall.   She is interested in lowering the dose of the Breztri from 2 puffs twice daily to 2 puffs once daily. She has already self-reduced her dose to 2 puffs twice daily every other day with 2 puffs once daily on alternative days and is still experiencing hoarseness.   As she did well on Stiolto, may consider resuming Stiolto and adding Pulmicort at a lower dose as this may be a less expensive alternative to [employer].   Please let me know your recommendations.  Debbora Dus, PharmD Clinical Pharmacist [facility] at [facility] [phone]

## 2019-10-09 NOTE — Telephone Encounter (Signed)
Sharyn Lull I HAD recommended that she go back to [employer].  She however decided to do whatever she wanted with the dosing of the [facility] which I DID NOT RECOMMEND.  She should go back to [employer] if she is having resistant issues.  I did not recommend dropping the dose of Breztri because it is not effective at lower doses.  Thank you, Dr. Darnell Level.

## 2019-10-09 NOTE — Telephone Encounter (Signed)
Great, I will let her know. Would you recommend any other inhaled corticosteroid if she chose to switch back to Stiolto?  Debbora Dus, PharmD Clinical Pharmacist [facility] at [facility] [phone]

## 2019-10-09 NOTE — Telephone Encounter (Signed)
It would be prudent to hold off on inhaled corticosteroids for now until she heals well.  The inhaled corticosteroid that best for patients with recurrent thrush is actually [place] (ciclesonide) however, unfortunately, most plans do not cover it but we could try to get prior Auth.  Dr. Darnell Level.

## 2019-10-14 DIAGNOSIS — J449 Chronic obstructive pulmonary disease, unspecified: Secondary | ICD-10-CM | POA: Diagnosis not present

## 2019-10-28 ENCOUNTER — Other Ambulatory Visit: Payer: Self-pay

## 2019-10-28 MED ORDER — MESALAMINE 1.2 G PO TBEC: 1.2000 g | DELAYED_RELEASE_TABLET | Freq: Every day | ORAL | 3 refills | Status: DC

## 2019-10-31 ENCOUNTER — Other Ambulatory Visit: Payer: Self-pay

## 2019-10-31 ENCOUNTER — Ambulatory Visit: Payer: Medicare HMO | Admitting: Pulmonary Disease

## 2019-10-31 ENCOUNTER — Encounter: Payer: Self-pay | Admitting: Pulmonary Disease

## 2019-10-31 ENCOUNTER — Ambulatory Visit
Admission: RE | Admit: 2019-10-31 | Discharge: 2019-10-31 | Disposition: A | Discharge status: Home / Self Care | Payer: Medicare HMO | Source: Ambulatory Visit | Attending: Pulmonary Disease | Admitting: Pulmonary Disease

## 2019-10-31 VITALS — BP 148/80 | HR 82 | Temp 97.8°F | Ht 64.17 in | Wt 107.8 lb

## 2019-10-31 DIAGNOSIS — R0600 Dyspnea, unspecified: Secondary | ICD-10-CM | POA: Diagnosis not present

## 2019-10-31 DIAGNOSIS — R0609 Other forms of dyspnea: Secondary | ICD-10-CM

## 2019-10-31 DIAGNOSIS — I272 Pulmonary hypertension, unspecified: Secondary | ICD-10-CM

## 2019-10-31 DIAGNOSIS — J449 Chronic obstructive pulmonary disease, unspecified: Secondary | ICD-10-CM

## 2019-10-31 MED ORDER — STIOLTO RESPIMAT 2.5-2.5 MCG/ACT IN AERS: 2.0000 | INHALATION_SPRAY | Freq: Every day | RESPIRATORY_TRACT | 3 refills | Status: DC

## 2019-10-31 NOTE — Patient Instructions (Addendum)
We are going to get some breathing tests  We will get a chest x-ray PA and lateral  We will order oxygen for daytime use through [employer]  We will restart Stiolto 2 inhalations daily  We will see you in follow-up in 2 months time call sooner should any new difficulties arise

## 2019-10-31 NOTE — Progress Notes (Signed)
Subjective:    Patient ID: Carrie Hendricks, female    DOB: [birth year], 69 y.o.   MRN: [id number]  Context: Requesting MD/Service:[facility] Date of initial consultation:02/17/17 by Dr. Alva Garnet Reason for consultation:DOE, COPD  PT PROFILE: 69 y.o.female,former smoker, referred for evaluation of progressive DOE and history of COPD. Former patient of Dr. Merton Border. Former smoker quit 2006.  DATA: 04/04/17 QMG:QQPYPP hyperinflation and hyperlucency without acute findings 05/18/17 PFTs:Very severe obstruction, FEV1 0.96 L (38%), hyperinflation-TLC 6.92 L (127%), RV 4.45 L (208%), DLCO 4.8 (28%), DLCO/VA 1.35 (37%) 07/18/2019: Had overnight oximetry which shows saturations as low as 78%, qualified for nocturnal O2, qualified also for daytime O2 but refuses daytime O2 07/24/2019: Severe pulmonary hypertension noted, grade 1 diastolic dysfunction noted, LVEF 60 to 66%  HPI 69 year old former smoker, follows here for the issue of dyspnea in the setting of very severe COPD.  He became more marked in November 2020.  Prior evaluations have shown that she qualifies for daytime oxygen however she declines to use during the day.  She continues to be compliant with oxygen at 2 L/min nocturnally.  She has severe pulmonary hypertension.  She continues to engage in work however is becoming more difficult due to severe debilitating dyspnea.  In December 2020 we had given her a trial Judithann Sauger however she has developed difficulties with repeated bouts of thrush despite proper rinsing and has not been able to use the [place] as directed twice a day.  She has been using it only once daily.  We had recommended that she switch back to [facility] however she had declined to do this.  Today she presents with usual issues with very debilitating dyspnea.  She is now more willing to consider oxygen during the daytime.  She is also willing to try switching back to Stiolto once daily since she is unable to use Breztri  at the proper dose.  The patient has also suffered polycythemia due to chronic hypoxemia.  She has not had any fevers, chills or sweats.  No change in cough pattern.  Occasionally productive of whitish sputum.  No mopped assist.  She does not describe any chest pain.  No orthopnea or paroxysmal nocturnal dyspnea.  As noted she has been compliant with nocturnal O2.  She has ongoing issues with her ulcerative pancolitis and rheumatoid arthritis.  She is on Biologics for this.   Review of Systems  A 10 point review of systems was performed and it is as noted above otherwise negative.   Immunization History  Administered Date(s) Administered  . Fluad Quad(high Dose 65+) 03/08/2019  . Hep A / Hep B 11/03/2016, 12/06/2016  . Hepatitis B, adult 05/09/2017  . Influenza Split 04/03/2011  . Influenza Whole 04/03/2010  . Influenza, High Dose Seasonal PF 04/08/2016, 03/31/2017, 04/21/2018  . Influenza,inj,Quad PF,6+ Mos 03/05/2014  . Influenza-Unspecified 04/03/2013, 03/05/2014, 05/03/2015  . PFIZER SARS-COV-2 Vaccination 08/15/2019, 09/05/2019  . Pneumococcal Conjugate-13 06/10/2016  . Pneumococcal Polysaccharide-23 03/28/2007, 10/15/2013, 08/15/2018  . Td 06/11/2010   Current Meds  Medication Sig  . albuterol (VENTOLIN HFA) 108 (90 Base) MCG/ACT inhaler Inhale 2 puffs into the lungs every 6 (six) hours as needed for wheezing or shortness of breath.  Marland Kitchen alendronate (FOSAMAX) 70 MG tablet Take 1 tablet by mouth once a week.  Marland Kitchen amLODipine (NORVASC) 5 MG tablet Take 1 tablet (5 mg total) by mouth daily.  . Aspirin (ADULT ASPIRIN LOW STRENGTH) 81 MG EC tablet Take 81 mg by mouth daily.    Marland Kitchen  Budeson-Glycopyrrol-Formoterol (BREZTRI AEROSPHERE) 160-9-4.8 MCG/ACT AERO Inhale 2 puffs into the lungs 2 (two) times daily. (Patient taking differently: Inhale 2 puffs into the lungs daily. Patient is doing 2 puffs 1 time daily)  . etanercept (ENBREL SURECLICK) 50 MG/ML injection Inject 50 mg into the skin once a  week.  . hydrochlorothiazide (HYDRODIURIL) 25 MG tablet Take 1 tablet (25 mg total) by mouth daily.  . hydroxychloroquine (PLAQUENIL) 200 MG tablet Take 200 mg by mouth daily.  . mesalamine (LIALDA) 1.2 g EC tablet Take 1 tablet (1.2 g total) by mouth daily with breakfast.  . Multiple Vitamin (MULTIVITAMIN) capsule Take 1 capsule by mouth daily.  Marland Kitchen Spacer/Aero-Holding Chambers (AEROCHAMBER MV) inhaler Use as instructed       Objective:   Physical Exam BP (!) 148/80 (BP Location: Left Arm, Patient Position: Sitting, Cuff Size: Normal)   Pulse 82   Temp 97.8 F (36.6 C) (Temporal)   Ht 5' 4.17" (1.63 m)   Wt 107 lb 12.8 oz (48.9 kg)   SpO2 91%   BMI 18.40 kg/m  GENERAL: Very thin woman in no acute respiratory distress, there is however chronic use of accessories. HEAD: Normocephalic, atraumatic.  EYES: Pupils equal, round, reactive to light.  No scleral icterus.  MOUTH: Nose/mouth/throat not examined due to masking requirements for COVID 19. NECK: Supple. No thyromegaly. No nodules.  There is JVD.  Trachea midline.  No crepitus. PULMONARY: Increased AP diameter.  Very distant breath sounds, no adventitious sounds. CARDIOVASCULAR: S1 and S2. Regular rate and rhythm.  No rubs murmurs or gallops heard. GASTROINTESTINAL: Scaphoid, no distention.   MUSCULOSKELETAL: No joint deformity, no clubbing, no edema.  NEUROLOGIC: No overt focal deficit.  Awake, alert, speech is fluent. SKIN: Intact,warm,dry.  No rashes noted. PSYCH: Anxious mood, normal behavior.   Chest x-ray shows hyperinflation, emphysematous changes, no fibrosis.  No change from prior:   Symmetry demonstrated that she desaturates to 85% with ambulation.  Required 2 L/min to maintain oxygen saturation at 90% or better.  Assessment & Plan:   Very severe COPD GOLD stage IV PFTs ordered Not tolerating Breztri, underdosed on LAMA/LABA: Discontinue Resume Stiolto, 2 inhalations daily Continue rescue albuterol Follow-up in  2 months time call sooner should any difficulties arise  Shortness of breath Chronic respiratory failure with hypoxia Pulmonary hypertension due to chronic hypoxic vasoconstriction  She qualifies for oxygen therapy during the daytime, she is now willing to use, referral to DME Continue nighttime oxygen supplementation at 2 L/min   Polycythemia due to hypoxia in the setting of severe COPD Oxygen supplementation as noted above Patient now willing to wear oxygen during the day Continue oxygen nocturnally  Rheumatoid arthritis Ulcerative pancolitis On Plaquenil, Enbrel, Lialda This issue adds complexity to her management   C. Derrill Kay, MD [facility]   *This note was dictated using voice recognition software/Dragon.  Despite best efforts to proofread, errors can occur which can change the meaning.  Any change was purely unintentional.

## 2019-11-04 NOTE — Telephone Encounter (Signed)
Carrie Hendricks called [facility] to see what happened and they said she refused the oxygen tanks. I called and spoke with patient and she stated that they tried to give her the bigger O2 tanks and she did refuse those. Explained to her that DME does not have any POC so these were her options. She stated she is fine with the smaller tanks if that's what she needs and that the gentleman who came out was very hard to understand. We will contact [employer] to let them know she wants the smaller tanks and to please send out someone else. [facility] said it would be today or tomorrow that would get them out to the patient. Nothing further needed at this time

## 2019-11-04 NOTE — Addendum Note (Signed)
Addended by: Lia Foyer R on: 11/04/2019 01:53 PM   Modules accepted: Orders

## 2019-11-04 NOTE — Telephone Encounter (Signed)
You can order her a portable concentrator.  These may still be somewhat larger than the portable bottle that [facility] was offering.  If she is not satisfied with their portable concentrator's she may look into [employer] online.

## 2019-11-04 NOTE — Telephone Encounter (Signed)
Dr. Patsey Berthold, Sending this to you as an FYI, patients message from mychart   I did not accept the oxygen equipment because it was not what Dr Patsey Berthold ordered for me. What was order was out of stock , so they brought something bigger and I really didn't understand what was what. He said that when the original equipment ordered come it I will receive that. I agreed to wait. I am not backing out of using more oxygen. You can call me on my cellphone. Sorry for being such a problem patient. Thank you for all you do for me.  Carrie Hendricks

## 2019-11-06 DIAGNOSIS — J449 Chronic obstructive pulmonary disease, unspecified: Secondary | ICD-10-CM | POA: Diagnosis not present

## 2019-11-12 ENCOUNTER — Other Ambulatory Visit
Admission: RE | Admit: 2019-11-12 | Discharge: 2019-11-12 | Disposition: A | Discharge status: Home / Self Care | Payer: Medicare HMO | Source: Ambulatory Visit | Attending: Pulmonary Disease | Admitting: Pulmonary Disease

## 2019-11-12 DIAGNOSIS — Z20822 Contact with and (suspected) exposure to covid-19: Secondary | ICD-10-CM | POA: Diagnosis not present

## 2019-11-12 DIAGNOSIS — Z01812 Encounter for preprocedural laboratory examination: Secondary | ICD-10-CM | POA: Diagnosis not present

## 2019-11-12 LAB — SARS CORONAVIRUS 2 (TAT 6-24 HRS): SARS Coronavirus 2: NEGATIVE

## 2019-11-13 ENCOUNTER — Ambulatory Visit: Payer: Medicare HMO | Attending: Pulmonary Disease

## 2019-11-13 ENCOUNTER — Other Ambulatory Visit: Payer: Self-pay

## 2019-11-13 DIAGNOSIS — R0609 Other forms of dyspnea: Secondary | ICD-10-CM

## 2019-11-13 DIAGNOSIS — J449 Chronic obstructive pulmonary disease, unspecified: Secondary | ICD-10-CM | POA: Insufficient documentation

## 2019-11-13 DIAGNOSIS — R0600 Dyspnea, unspecified: Secondary | ICD-10-CM | POA: Diagnosis not present

## 2019-11-13 MED ORDER — ALBUTEROL SULFATE (2.5 MG/3ML) 0.083% IN NEBU
2.5000 mg | INHALATION_SOLUTION | Freq: Once | RESPIRATORY_TRACT | Status: AC
  Administered 2019-11-13: 08:00:00 2.5 mg via RESPIRATORY_TRACT
  Filled 2019-11-13: qty 3

## 2019-11-25 ENCOUNTER — Telehealth: Payer: Self-pay | Admitting: Family Medicine

## 2019-11-25 NOTE — Telephone Encounter (Signed)
Left message for patient to call back and schedule Medicare Annual Wellness Visit (AWV) with Nurse Health Advisor   This should be a telephone visit only.  Last AWV 08/14/18

## 2019-12-07 DIAGNOSIS — J449 Chronic obstructive pulmonary disease, unspecified: Secondary | ICD-10-CM | POA: Diagnosis not present

## 2019-12-13 ENCOUNTER — Telehealth: Payer: Self-pay | Admitting: Oncology

## 2019-12-13 NOTE — Telephone Encounter (Signed)
Writer spoke with patient on this date. Patient stated that she does not feel that she needs to see MD as she is doing better and would like to cancel appts on 12-20-19. Patient stated that she would continue to be followed by PCP. Writer advised patient to phone [facility] to reschedule, if needed. Appts cancelled per patient's request.

## 2019-12-14 DIAGNOSIS — J449 Chronic obstructive pulmonary disease, unspecified: Secondary | ICD-10-CM | POA: Diagnosis not present

## 2019-12-20 ENCOUNTER — Ambulatory Visit: Payer: Medicare HMO | Admitting: Oncology

## 2019-12-20 ENCOUNTER — Other Ambulatory Visit: Payer: Medicare HMO

## 2019-12-26 ENCOUNTER — Encounter: Payer: Self-pay | Admitting: Pulmonary Disease

## 2019-12-26 ENCOUNTER — Other Ambulatory Visit: Payer: Self-pay

## 2019-12-26 ENCOUNTER — Ambulatory Visit: Payer: Medicare HMO | Admitting: Pulmonary Disease

## 2019-12-26 VITALS — BP 148/88 | HR 77 | Temp 97.8°F | Ht 64.0 in | Wt 105.0 lb

## 2019-12-26 DIAGNOSIS — J9611 Chronic respiratory failure with hypoxia: Secondary | ICD-10-CM

## 2019-12-26 DIAGNOSIS — D751 Secondary polycythemia: Secondary | ICD-10-CM

## 2019-12-26 DIAGNOSIS — J449 Chronic obstructive pulmonary disease, unspecified: Secondary | ICD-10-CM

## 2019-12-26 DIAGNOSIS — I272 Pulmonary hypertension, unspecified: Secondary | ICD-10-CM

## 2019-12-26 NOTE — Patient Instructions (Addendum)
What we discussed today:  We will give you the contact to [employer], unfortunately [facility] is no longer carrying them  We will continue Stiolto for now  We discussed that your lung function is at 28% this has decreased 10% since 2018  You will need to continue oxygen at 2 L/min to help you with your breathlessness  Please contact Dr. [name] to help you with your issues with depression and anxiety  We will see you in follow-up in 3 months time call sooner should any new difficulties arise

## 2020-01-03 ENCOUNTER — Ambulatory Visit (INDEPENDENT_AMBULATORY_CARE_PROVIDER_SITE_OTHER): Payer: Medicare HMO | Admitting: Family Medicine

## 2020-01-03 ENCOUNTER — Encounter: Payer: Self-pay | Admitting: Family Medicine

## 2020-01-03 ENCOUNTER — Other Ambulatory Visit: Payer: Self-pay

## 2020-01-03 VITALS — BP 140/68 | HR 78 | Temp 96.2°F | Wt 105.0 lb

## 2020-01-03 DIAGNOSIS — J449 Chronic obstructive pulmonary disease, unspecified: Secondary | ICD-10-CM

## 2020-01-03 DIAGNOSIS — I10 Essential (primary) hypertension: Secondary | ICD-10-CM | POA: Diagnosis not present

## 2020-01-03 DIAGNOSIS — R600 Localized edema: Secondary | ICD-10-CM | POA: Diagnosis not present

## 2020-01-03 DIAGNOSIS — F430 Acute stress reaction: Secondary | ICD-10-CM | POA: Diagnosis not present

## 2020-01-03 NOTE — Assessment & Plan Note (Signed)
Mild-more in legs than ankles (tight feeling)  I suspect amlodipine may be a factor  Pt wishes to continue and monitor it  Adv to elevate legs when able and wear supp socks if helpful

## 2020-01-03 NOTE — Assessment & Plan Note (Signed)
Pt continues to work  Sees pulmonary  Looking into an [employer] - which would considerable improve quality of life

## 2020-01-03 NOTE — Progress Notes (Signed)
Subjective:    Patient ID: Carrie Hendricks, female    DOB: 18-Jun-1951, 69 y.o.   MRN: [id number]  This visit occurred during the SARS-CoV-2 public health emergency.  Safety protocols were in place, including screening questions prior to the visit, additional usage of staff PPE, and extensive cleaning of exam room while observing appropriate contact time as indicated for disinfecting solutions.    HPI Pt presents with some pedal edema /stress reaction   Also questions about overall health   Wt Readings from Last 3 Encounters:  01/03/20 105 lb (47.6 kg)  12/26/19 105 lb (47.6 kg)  10/31/19 107 lb 12.8 oz (48.9 kg)   18.02 kg/m  Baseline underweight   Swelling occurs in calves and thighs  Feels very tight  Usually tolerable  Watches salt  Has support hose if needed   A little more anxious-per her daughters  She does not want to go out as much  Isolates herself more than she used    Sometimes a little forgetful - perhaps from anxiety  She cries easily at times (for instance watching TV) Gets frustrated as well  Does not feel hopeless  Has good faith and it really helps   [employer] games on her tablet  Got away from reading  Not outdoors as much in the heat    Combination of fatigue and need to be comfortable   Still works a full time job -challenging and keeps her going   Support- has a SIL who she talks to  Also her daughters   May consider counseling in the future  Not ready for medication     Suffers from copd and pulm HTN as well as UC and RA Working on getting 02 concentrator that is less heavy  When conditions are right would like to walk   Lab Results  Component Value Date   CREATININE 0.75 02/19/2019   BUN 10 02/19/2019   NA 137 02/19/2019   K 3.7 02/19/2019   CL 94 (L) 02/19/2019   CO2 26 02/19/2019   HTN bp is stable today  No cp or palpitations or headaches or edema  No side effects to medicines  BP Readings from Last 3 Encounters:    01/03/20 140/68  12/26/19 (!) 148/88  10/31/19 (!) 148/80    Takes amlodipine and hctz  Patient Active Problem List   Diagnosis Date Noted  . Elevated hemoglobin ([place]) 03/08/2019  . COPD, very severe ([place]) 08/15/2018  . Sleep disorder 10/10/2017  . Screening mammogram, encounter for 09/12/2017  . Ulcerative colitis ([place]) 02/01/2017  . Lumbar degenerative disc disease 01/20/2017  . Scoliosis 06/28/2016  . Welcome to Medicare preventive visit 06/10/2016  . Estrogen deficiency 06/10/2016  . Constipation 03/02/2016  . Underweight 03/02/2016  . Shortness of breath 05/16/2014  . History of COPD 05/16/2014  . Stress reaction 05/16/2014  . Connective tissue disease ([place]) 03/05/2014  . Pedal edema 10/15/2013  . Pulmonary hypertension ([place]) 10/10/2011  . Encounter for routine gynecological examination 07/11/2011  . Routine general medical examination at a health care facility 07/03/2011  . HEMORRHOIDS, INTERNAL, WITH BLEEDING 03/15/2010  . Essential hypertension 11/16/2006  . Rheumatoid arthritis ([place]) 11/16/2006  . Osteoporosis 11/16/2006   Past Medical History:  Diagnosis Date  . Arthritis    rheumatoid  . Complication of anesthesia   . Hypertension   . Osteoporosis   . PONV (postoperative nausea and vomiting)   . Pulmonary hypertension ([place])   . Rheumatoid arthritis(714.0)    ?  scleroderma   Past Surgical History:  Procedure Laterality Date  . colonosc neg  11/08  . COLONOSCOPY WITH PROPOFOL N/A 09/30/2016   Procedure: COLONOSCOPY WITH PROPOFOL;  Surgeon: Jonathon Bellows, MD;  Location: [facility] ENDOSCOPY;  Service: Endoscopy;  Laterality: N/A;  . COLONOSCOPY WITH PROPOFOL N/A 12/08/2017   Procedure: COLONOSCOPY WITH PROPOFOL;  Surgeon: Jonathon Bellows, MD;  Location: [facility] ENDOSCOPY;  Service: Gastroenterology;  Laterality: N/A;  . Dexa  07/2005   OP hip- 3.0  . ESOPHAGOGASTRODUODENOSCOPY (EGD) WITH PROPOFOL N/A 07/01/2016   Procedure: ESOPHAGOGASTRODUODENOSCOPY (EGD) WITH PROPOFOL;   Surgeon: Jonathon Bellows, MD;  Location: [facility] ENDOSCOPY;  Service: Endoscopy;  Laterality: N/A;  . FLEXIBLE SIGMOIDOSCOPY N/A 07/01/2016   Procedure: FLEXIBLE SIGMOIDOSCOPY;  Surgeon: Jonathon Bellows, MD;  Location: [facility] ENDOSCOPY;  Service: Endoscopy;  Laterality: N/A;  . LEEP  2005  . Surgery- tendon release in hand  2006  . TUBAL LIGATION     Social History   Tobacco Use  . Smoking status: Former Smoker    Packs/day: 1.25    Years: 30.00    Pack years: 51.50    Quit date: 2006    Years since quitting: 15.5  . Smokeless tobacco: Never Used  . Tobacco comment: Quit in 8/07  Vaping Use  . Vaping Use: Never used  Substance Use Topics  . Alcohol use: No    Alcohol/week: 0.0 standard drinks  . Drug use: No   Family History  Problem Relation Age of Onset  . Heart disease Mother   . Diabetes Mother   . Hypertension Father   . Macular degeneration Father   . Dementia Father        early  . Arthritis Father        RA  . Alcohol abuse Father   . Breast cancer Sister 74   No Known Allergies Current Outpatient Medications on File Prior to Visit  Medication Sig Dispense Refill  . albuterol (VENTOLIN HFA) 108 (90 Base) MCG/ACT inhaler Inhale 2 puffs into the lungs every 6 (six) hours as needed for wheezing or shortness of breath. 54 g 1  . alendronate (FOSAMAX) 70 MG tablet Take 1 tablet by mouth once a week.    Marland Kitchen amLODipine (NORVASC) 5 MG tablet Take 1 tablet (5 mg total) by mouth daily. 90 tablet 3  . Aspirin (ADULT ASPIRIN LOW STRENGTH) 81 MG EC tablet Take 81 mg by mouth daily.      Marland Kitchen etanercept (ENBREL SURECLICK) 50 MG/ML injection Inject 50 mg into the skin once a week.    . hydrochlorothiazide (HYDRODIURIL) 25 MG tablet Take 1 tablet (25 mg total) by mouth daily. 90 tablet 3  . hydroxychloroquine (PLAQUENIL) 200 MG tablet Take 200 mg by mouth daily.    . mesalamine (LIALDA) 1.2 g EC tablet Take 1 tablet (1.2 g total) by mouth daily with breakfast. 90 tablet 3  . Multiple Vitamin  (MULTIVITAMIN) capsule Take 1 capsule by mouth daily.    . Tiotropium Bromide-Olodaterol (STIOLTO RESPIMAT) 2.5-2.5 MCG/ACT AERS Inhale 2 puffs into the lungs daily. 12 g 3   No current facility-administered medications on file prior to visit.    Review of Systems  Constitutional: Positive for appetite change and fatigue. Negative for activity change, fever and unexpected weight change.  HENT: Negative for congestion, ear pain, rhinorrhea, sinus pressure and sore throat.   Eyes: Negative for pain, redness and visual disturbance.  Respiratory: Positive for shortness of breath. Negative for cough, wheezing and stridor.  Cardiovascular: Positive for leg swelling. Negative for chest pain and palpitations.  Gastrointestinal: Negative for abdominal pain, blood in stool, constipation and diarrhea.  Endocrine: Negative for polydipsia and polyuria.  Genitourinary: Negative for dysuria, frequency and urgency.  Musculoskeletal: Positive for arthralgias, back pain and joint swelling. Negative for myalgias.  Skin: Negative for pallor and rash.  Allergic/Immunologic: Negative for environmental allergies.  Neurological: Negative for dizziness, syncope, light-headedness and headaches.  Hematological: Negative for adenopathy. Does not bruise/bleed easily.  Psychiatric/Behavioral: Positive for sleep disturbance. Negative for decreased concentration, dysphoric mood and suicidal ideas. The patient is nervous/anxious.        Objective:   Physical Exam Constitutional:      General: She is not in acute distress.    Appearance: Normal appearance. She is well-developed. She is not ill-appearing.     Comments: Under weight   HENT:     Head: Normocephalic and atraumatic.     Mouth/Throat:     Mouth: Mucous membranes are moist.  Eyes:     General: No scleral icterus.    Conjunctiva/sclera: Conjunctivae normal.     Pupils: Pupils are equal, round, and reactive to light.  Neck:     Thyroid: No thyromegaly.       Vascular: No carotid bruit or JVD.  Cardiovascular:     Rate and Rhythm: Normal rate and regular rhythm.     Pulses: Normal pulses.     Heart sounds: Normal heart sounds. No gallop.   Pulmonary:     Effort: Pulmonary effort is normal. No respiratory distress.     Breath sounds: Normal breath sounds. No wheezing or rales.  Abdominal:     General: Bowel sounds are normal. There is no distension or abdominal bruit.     Palpations: Abdomen is soft. There is no mass.     Tenderness: There is no abdominal tenderness.  Musculoskeletal:     Cervical back: Normal range of motion and neck supple.     Comments: No pitting edema noted in legs   Joint changes of RA diffusely   Lymphadenopathy:     Cervical: No cervical adenopathy.  Skin:    General: Skin is warm and dry.     Coloration: Skin is not jaundiced or pale.     Findings: No erythema or rash.  Neurological:     Mental Status: She is alert.     Sensory: No sensory deficit.     Motor: No tremor.     Coordination: Coordination normal.     Deep Tendon Reflexes: Reflexes are normal and symmetric. Reflexes normal.  Psychiatric:        Attention and Perception: Attention normal.        Mood and Affect: Mood is anxious. Mood is not depressed.        Speech: Speech normal.        Behavior: Behavior normal.        Thought Content: Thought content normal.        Cognition and Memory: Cognition and memory normal.     Comments: Candid when discussing stressors  Good insight  Pleasant            Assessment & Plan:   Problem List Items Addressed This Visit      Cardiovascular and Mediastinum   Essential hypertension    bp in fair control at this time  BP Readings from Last 1 Encounters:  01/03/20 140/68   No changes needed Most recent labs reviewed  Disc lifstyle  change with low sodium diet and exercise  Suspect amlodipine is causing some of her tightness/edema in legs  This is not intolerable and she wants to continue  it  If worse will let us know         Respiratory   COPD, very severe ([place])    Pt continues to work  [employer] pulmonary  Looking into an [employer] - which would considerable improve quality of life        Other   Pedal edema - Primary    Mild-more in legs than ankles (tight feeling)  I suspect amlodipine may be a factor  Pt wishes to continue and monitor it  Adv to elevate legs when able and wear supp socks if helpful      Stress reaction    Overall with good coping mechanisms and support  Reviewed stressors/ coping techniques/symptoms/ support sources/ tx options and side effects in detail today  Health problems are frustrating but she remains very motivated and positive most of the time  Feels she needs more moments of peace and alone time  Disc stretching/chair yoga if able Also meditation apps Declines counseling right now-may consider later  Also not ready for medication  -if worse may benefit from ssri

## 2020-01-03 NOTE — Assessment & Plan Note (Signed)
Overall with good coping mechanisms and support  Reviewed stressors/ coping techniques/symptoms/ support sources/ tx options and side effects in detail today  Health problems are frustrating but she remains very motivated and positive most of the time  Feels she needs more moments of peace and alone time  Disc stretching/chair yoga if able Also meditation apps Declines counseling right now-may consider later  Also not ready for medication  -if worse may benefit from ssri

## 2020-01-03 NOTE — Assessment & Plan Note (Signed)
bp in fair control at this time  BP Readings from Last 1 Encounters:  01/03/20 140/68   No changes needed Most recent labs reviewed  Disc lifstyle change with low sodium diet and exercise  Suspect amlodipine is causing some of her tightness/edema in legs  This is not intolerable and she wants to continue it  If worse will let us know

## 2020-01-03 NOTE — Patient Instructions (Addendum)
Get on line and look up chair exercise videos (yoga)   Meditation is helpful  apps - Calm, Headspace, Breethe - very helpful   Continue to practice good self care You have good coping skills   If you become interested in counseling or medication for stress reaction just let us know  If swelling becomes intolerable let me know

## 2020-01-06 DIAGNOSIS — J449 Chronic obstructive pulmonary disease, unspecified: Secondary | ICD-10-CM | POA: Diagnosis not present

## 2020-01-08 NOTE — Progress Notes (Signed)
    Assessment & Plan:  1. COPD, very severe (HCC) (Primary)  2. Chronic respiratory failure with hypoxia (HCC)  3. Pulmonary hypertension (HCC)  4. 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 Randeen to help you with your issues with depression and anxiety We will see you in follow-up in 3 months time call sooner should any new difficulties arise  Please note: late entry documentation due to logistical difficulties during COVID-19 pandemic. This note is filed for information purposes only, and is not intended to be used for billing, nor does it represent the full scope/nature of the visit in question. Please see any associated scanned media linked to date of encounter for additional pertinent information.  Subjective:    [name] is a 69 y.o. female presenting to the pulmonology clinic on 12/26/2019 with report of: Follow-up (pt reports of increased sob with exertion and prod cough with yellow mucus. on 2L cont)     Outpatient Encounter Medications as of 12/26/2019  Medication Sig   Aspirin  81 MG EC tablet Take 81 mg by mouth daily.   etanercept (ENBREL) 50 MG/ML injection Inject 50 mg into the skin once a week.   hydroxychloroquine  (PLAQUENIL ) 200 MG tablet Take 200 mg by mouth daily.   Multiple Vitamin (MULTIVITAMIN) capsule Take 1 capsule by mouth daily.   [DISCONTINUED] albuterol  (VENTOLIN  HFA) 108 (90 Base) MCG/ACT inhaler Inhale 2 puffs into the lungs every 6 (six) hours as needed for wheezing or shortness of breath.   [DISCONTINUED] alendronate (FOSAMAX) 70 MG tablet Take 1 tablet by mouth once a week.   [DISCONTINUED]  amLODipine  (NORVASC ) 5 MG tablet Take 1 tablet (5 mg total) by mouth daily.   [DISCONTINUED] hydrochlorothiazide  (HYDRODIURIL ) 25 MG tablet Take 1 tablet (25 mg total) by mouth daily.   [DISCONTINUED] mesalamine  (LIALDA ) 1.2 g EC tablet Take 1 tablet (1.2 g total) by mouth daily with breakfast.   [DISCONTINUED] Spacer/Aero-Holding Chambers (AEROCHAMBER MV) inhaler Use as instructed   [DISCONTINUED] Tiotropium Bromide -Olodaterol (STIOLTO RESPIMAT ) 2.5-2.5 MCG/ACT AERS Inhale 2 puffs into the lungs daily.   No facility-administered encounter medications on file as of 12/26/2019.      Objective:   Vitals:   12/26/19 0916  BP: (!) 148/88  Pulse: 77  Temp: 97.8 F (36.6 C)  Height: 5' 4 (1.626 m)  Weight: 105 lb (47.6 kg)  SpO2: 96%  TempSrc: Temporal  BMI (Calculated): 18.01     Physical exam documentation is limited by delayed entry of information.

## 2020-01-13 DIAGNOSIS — J449 Chronic obstructive pulmonary disease, unspecified: Secondary | ICD-10-CM | POA: Diagnosis not present

## 2020-01-17 ENCOUNTER — Telehealth: Payer: Medicare HMO

## 2020-01-27 ENCOUNTER — Other Ambulatory Visit: Payer: Self-pay | Admitting: Family Medicine

## 2020-01-28 ENCOUNTER — Other Ambulatory Visit: Payer: Self-pay | Admitting: Family Medicine

## 2020-01-28 DIAGNOSIS — Z1231 Encounter for screening mammogram for malignant neoplasm of breast: Secondary | ICD-10-CM

## 2020-02-06 DIAGNOSIS — K51919 Ulcerative colitis, unspecified with unspecified complications: Secondary | ICD-10-CM | POA: Diagnosis not present

## 2020-02-06 DIAGNOSIS — I7300 Raynaud's syndrome without gangrene: Secondary | ICD-10-CM | POA: Diagnosis not present

## 2020-02-06 DIAGNOSIS — M0579 Rheumatoid arthritis with rheumatoid factor of multiple sites without organ or systems involvement: Secondary | ICD-10-CM | POA: Diagnosis not present

## 2020-02-06 DIAGNOSIS — M810 Age-related osteoporosis without current pathological fracture: Secondary | ICD-10-CM | POA: Diagnosis not present

## 2020-02-06 DIAGNOSIS — Z79899 Other long term (current) drug therapy: Secondary | ICD-10-CM | POA: Diagnosis not present

## 2020-02-06 DIAGNOSIS — J449 Chronic obstructive pulmonary disease, unspecified: Secondary | ICD-10-CM | POA: Diagnosis not present

## 2020-02-06 DIAGNOSIS — M48062 Spinal stenosis, lumbar region with neurogenic claudication: Secondary | ICD-10-CM | POA: Diagnosis not present

## 2020-02-07 ENCOUNTER — Encounter: Payer: Self-pay | Admitting: Family Medicine

## 2020-02-07 ENCOUNTER — Encounter: Payer: Self-pay | Admitting: Oncology

## 2020-02-07 NOTE — Telephone Encounter (Signed)
I am able to view her labs fthrough "Care Everywhere" . Her blood count is stable.

## 2020-02-07 NOTE — Telephone Encounter (Signed)
Dr. Patsey Berthold, please see attached lab results. Thanks

## 2020-02-13 DIAGNOSIS — J449 Chronic obstructive pulmonary disease, unspecified: Secondary | ICD-10-CM | POA: Diagnosis not present

## 2020-02-14 ENCOUNTER — Ambulatory Visit
Admission: RE | Admit: 2020-02-14 | Discharge: 2020-02-14 | Disposition: A | Discharge status: Home / Self Care | Payer: Medicare HMO | Source: Ambulatory Visit | Attending: Family Medicine | Admitting: Family Medicine

## 2020-02-14 ENCOUNTER — Other Ambulatory Visit: Payer: Self-pay

## 2020-02-14 DIAGNOSIS — Z1231 Encounter for screening mammogram for malignant neoplasm of breast: Secondary | ICD-10-CM | POA: Insufficient documentation

## 2020-03-05 DIAGNOSIS — Z79899 Other long term (current) drug therapy: Secondary | ICD-10-CM | POA: Diagnosis not present

## 2020-03-05 DIAGNOSIS — H25013 Cortical age-related cataract, bilateral: Secondary | ICD-10-CM | POA: Diagnosis not present

## 2020-03-05 DIAGNOSIS — H2513 Age-related nuclear cataract, bilateral: Secondary | ICD-10-CM | POA: Diagnosis not present

## 2020-03-05 DIAGNOSIS — H524 Presbyopia: Secondary | ICD-10-CM | POA: Diagnosis not present

## 2020-03-08 DIAGNOSIS — J449 Chronic obstructive pulmonary disease, unspecified: Secondary | ICD-10-CM | POA: Diagnosis not present

## 2020-03-15 DIAGNOSIS — J449 Chronic obstructive pulmonary disease, unspecified: Secondary | ICD-10-CM | POA: Diagnosis not present

## 2020-03-27 DIAGNOSIS — Z0100 Encounter for examination of eyes and vision without abnormal findings: Secondary | ICD-10-CM | POA: Diagnosis not present

## 2020-04-07 DIAGNOSIS — J449 Chronic obstructive pulmonary disease, unspecified: Secondary | ICD-10-CM | POA: Diagnosis not present

## 2020-04-14 DIAGNOSIS — J449 Chronic obstructive pulmonary disease, unspecified: Secondary | ICD-10-CM | POA: Diagnosis not present

## 2020-04-28 ENCOUNTER — Other Ambulatory Visit: Payer: Self-pay

## 2020-04-28 ENCOUNTER — Encounter: Payer: Self-pay | Admitting: Pulmonary Disease

## 2020-04-28 ENCOUNTER — Ambulatory Visit: Payer: Medicare HMO | Admitting: Pulmonary Disease

## 2020-04-28 VITALS — BP 132/82 | HR 77 | Temp 97.0°F | Ht 66.0 in | Wt 103.0 lb

## 2020-04-28 DIAGNOSIS — Z0000 Encounter for general adult medical examination without abnormal findings: Secondary | ICD-10-CM | POA: Diagnosis not present

## 2020-04-28 DIAGNOSIS — J449 Chronic obstructive pulmonary disease, unspecified: Secondary | ICD-10-CM

## 2020-04-28 DIAGNOSIS — M069 Rheumatoid arthritis, unspecified: Secondary | ICD-10-CM | POA: Diagnosis not present

## 2020-04-28 DIAGNOSIS — J9611 Chronic respiratory failure with hypoxia: Secondary | ICD-10-CM | POA: Insufficient documentation

## 2020-04-28 DIAGNOSIS — I272 Pulmonary hypertension, unspecified: Secondary | ICD-10-CM

## 2020-04-28 MED ORDER — STIOLTO RESPIMAT 2.5-2.5 MCG/ACT IN AERS: 2.0000 | INHALATION_SPRAY | Freq: Every day | RESPIRATORY_TRACT | 0 refills | Status: AC

## 2020-04-28 NOTE — Progress Notes (Signed)
@Patient  ID: Carrie Hendricks, female    DOB: [birth year], 69 y.o.   MRN: [id number]  Chief Complaint  Patient presents with  . Follow-up    SOB when walking, cough every once in a while, when she does cough up something its clear/light yellow. denies wheezing.    Referring provider: Tower, Wynelle Fanny, MD  HPI:  69 year old female former smoker followed in our office for COPD  PMH: Hypertension, rheumatoid arthritis (on Plaquenil and enbrel), pulmonary hypertension, ulcerative colitis Smoker/ Smoking History: Former smoker.  Quit 2006.  37.5-pack-year smoking history Maintenance: Stiolto Respimat Pt of: Dr. Patsey Berthold  04/28/2020  - Visit   69 year old female former smoker followed in our office for COPD.  She is established with Dr. Patsey Berthold.  She was last seen in our office in June/2021.  Patient reported that she was trialed on Breztri and this caused voice changes and hoarseness.  She is transition back to [employer].  She prefers to be maintained on this.  She has not needed any steroids or antibiotics since last being seen.  She continues to follow with [facility] rheumatology.  She is maintained on Plaquenil as well as Enbrel for management of rheumatoid arthritis.  She is interested in discussing pulmonary rehab today.  We will also discuss her compliance with utilizing oxygen especially with physical exertion as she has been struggling with this.  Questionaires / Pulmonary Flowsheets:   ACT:  No flowsheet data found.  MMRC: mMRC Dyspnea Scale mMRC Score  04/28/2020 3    Epworth:  No flowsheet data found.  Tests:   FENO:  No results found for: NITRICOXIDE  PFT: No flowsheet data found.  WALK:  SIX MIN WALK 10/31/2019 07/31/2019 07/02/2019 07/02/2019  Supplimental Oxygen during Test? (L/min) Yes Yes Yes No  O2 Flow Rate 2 2 2  -  Type Continuous Continuous - -  Tech Comments: Patient walked average pace. O2 dropped to 85% patient placed on 2 liters and  maintained good O2 sats Walked 2 laps at moderate pace. No compliants. Walked stopped due to decrease in O2 and placed on 2L. Recovered quickly 95O2 and 87HR. moderate pace on 2L of O2/no complaints of pain or fatigue. Stated her mouth was dry. TG moderate pace/no complaints of pain or fatigue/stated mouth was dry.TG    Imaging: No results found.  Lab Results:  CBC    Component Value Date/Time   WBC 5.0 07/25/2019 1443   RBC 4.69 07/25/2019 1443   HGB 15.2 (H) 07/25/2019 1443   HGB 16.0 (H) 02/19/2019 1517   HCT 45.3 07/25/2019 1443   HCT 44.9 02/19/2019 1517   PLT 172 07/25/2019 1443   PLT 183 02/19/2019 1517   MCV 96.6 07/25/2019 1443   MCV 94 02/19/2019 1517   MCH 32.4 07/25/2019 1443   MCHC 33.6 07/25/2019 1443   RDW 12.1 07/25/2019 1443   RDW 11.7 02/19/2019 1517   LYMPHSABS 1.8 07/25/2019 1443   LYMPHSABS 1.9 02/19/2019 1517   MONOABS 0.5 07/25/2019 1443   EOSABS 0.1 07/25/2019 1443   EOSABS 0.1 02/19/2019 1517   BASOSABS 0.0 07/25/2019 1443   BASOSABS 0.1 02/19/2019 1517    BMET    Component Value Date/Time   NA 137 02/19/2019 1517   K 3.7 02/19/2019 1517   CL 94 (L) 02/19/2019 1517   CO2 26 02/19/2019 1517   GLUCOSE 102 (H) 02/19/2019 1517   GLUCOSE 106 (H) 08/14/2018 1234   BUN 10 02/19/2019 1517   CREATININE 0.75 02/19/2019 1517  CREATININE 0.91 05/15/2015 1606   CALCIUM 10.2 02/19/2019 1517   GFRNONAA 82 02/19/2019 1517   GFRAA 95 02/19/2019 1517    BNP No results found for: BNP  ProBNP No results found for: PROBNP  Specialty Problems      Pulmonary Problems   Shortness of breath   COPD, very severe (HCC)   Chronic respiratory failure with hypoxia (HCC)      Allergies  Allergen Reactions  . Breztri Aerosphere [Budeson-Glycopyrrol-Formoterol]     hoarseness    Immunization History  Administered Date(s) Administered  . Fluad Quad(high Dose 65+) 03/08/2019, 04/17/2020  . Hep A / Hep B 11/03/2016, 12/06/2016  . Hepatitis B, adult  05/09/2017  . Influenza Split 04/03/2011  . Influenza Whole 04/03/2010  . Influenza, High Dose Seasonal PF 04/08/2016, 03/31/2017, 04/21/2018  . Influenza,inj,Quad PF,6+ Mos 03/05/2014  . Influenza-Unspecified 04/03/2013, 03/05/2014, 05/03/2015  . PFIZER SARS-COV-2 Vaccination 08/15/2019, 09/05/2019, 04/17/2020  . Pneumococcal Conjugate-13 06/10/2016  . Pneumococcal Polysaccharide-23 03/28/2007, 10/15/2013, 08/15/2018  . Td 06/11/2010    Past Medical History:  Diagnosis Date  . Arthritis    rheumatoid  . Complication of anesthesia   . Hypertension   . Osteoporosis   . PONV (postoperative nausea and vomiting)   . Pulmonary hypertension ([place])   . Rheumatoid arthritis(714.0)    ? scleroderma    Tobacco History: Social History   Tobacco Use  Smoking Status Former Smoker  . Packs/day: 1.25  . Years: 30.00  . Pack years: 37.50  . Quit date: 2006  . Years since quitting: 15.8  Smokeless Tobacco Never Used  Tobacco Comment   Quit in 8/07   Counseling given: Not Answered Comment: Quit in 8/07   Continue to not smoke  Outpatient Encounter Medications as of 04/28/2020  Medication Sig  . albuterol (VENTOLIN HFA) 108 (90 Base) MCG/ACT inhaler Inhale 2 puffs into the lungs every 6 (six) hours as needed for wheezing or shortness of breath.  Marland Kitchen alendronate (FOSAMAX) 70 MG tablet Take 1 tablet by mouth once a week.  Marland Kitchen amLODipine (NORVASC) 5 MG tablet Take 1 tablet (5 mg total) by mouth daily.  . Aspirin (ADULT ASPIRIN LOW STRENGTH) 81 MG EC tablet Take 81 mg by mouth daily.    Marland Kitchen etanercept (ENBREL SURECLICK) 50 MG/ML injection Inject 50 mg into the skin once a week.  . hydrochlorothiazide (HYDRODIURIL) 25 MG tablet Take 1 tablet (25 mg total) by mouth daily.  . hydroxychloroquine (PLAQUENIL) 200 MG tablet Take 200 mg by mouth daily.  . mesalamine (LIALDA) 1.2 g EC tablet Take 1 tablet (1.2 g total) by mouth daily with breakfast.  . Multiple Vitamin (MULTIVITAMIN) capsule Take 1  capsule by mouth daily.  . Tiotropium Bromide-Olodaterol (STIOLTO RESPIMAT) 2.5-2.5 MCG/ACT AERS Inhale 2 puffs into the lungs daily.  . Tiotropium Bromide-Olodaterol (STIOLTO RESPIMAT) 2.5-2.5 MCG/ACT AERS Inhale 2 puffs into the lungs daily for 1 day.   No facility-administered encounter medications on file as of 04/28/2020.     Review of Systems  Review of Systems  Constitutional: Positive for fatigue. Negative for activity change and fever.  HENT: Negative for sinus pressure, sinus pain and sore throat.   Respiratory: Positive for shortness of breath. Negative for cough and wheezing.   Cardiovascular: Negative for chest pain and palpitations.  Gastrointestinal: Negative for diarrhea, nausea and vomiting.  Musculoskeletal: Negative for arthralgias.  Neurological: Negative for dizziness.  Psychiatric/Behavioral: Negative for sleep disturbance. The patient is not nervous/anxious.      Physical Exam  BP 132/82 (BP Location: Left Arm, Patient Position: Sitting, Cuff Size: Normal)   Pulse 77   Temp (!) 97 F (36.1 C) (Temporal)   Ht 5' 6"  (1.676 m)   Wt 103 lb (46.7 kg)   SpO2 98%   BMI 16.62 kg/m   Wt Readings from Last 5 Encounters:  04/28/20 103 lb (46.7 kg)  01/03/20 105 lb (47.6 kg)  12/26/19 105 lb (47.6 kg)  10/31/19 107 lb 12.8 oz (48.9 kg)  08/29/19 106 lb 12.8 oz (48.4 kg)    BMI Readings from Last 5 Encounters:  04/28/20 16.62 kg/m  01/03/20 18.02 kg/m  12/26/19 18.02 kg/m  10/31/19 18.40 kg/m  08/29/19 17.77 kg/m     Physical Exam Vitals and nursing note reviewed.  Constitutional:      General: She is not in acute distress.    Appearance: Normal appearance.     Comments: Thin cachectic female  HENT:     Head: Normocephalic and atraumatic.     Right Ear: External ear normal.     Left Ear: External ear normal.     Mouth/Throat:     Mouth: Mucous membranes are moist.     Pharynx: Oropharynx is clear.  Eyes:     Pupils: Pupils are equal,  round, and reactive to light.  Cardiovascular:     Rate and Rhythm: Normal rate and regular rhythm.     Pulses: Normal pulses.     Heart sounds: Normal heart sounds. No murmur heard.   Pulmonary:     Breath sounds: No decreased air movement. No decreased breath sounds, wheezing or rales.     Comments: Diminished breath sounds throughout exam Musculoskeletal:     Cervical back: Normal range of motion.  Skin:    General: Skin is warm and dry.     Capillary Refill: Capillary refill takes less than 2 seconds.  Neurological:     General: No focal deficit present.     Mental Status: She is alert and oriented to person, place, and time. Mental status is at baseline.     Gait: Gait normal.  Psychiatric:        Mood and Affect: Mood normal.        Behavior: Behavior normal.        Thought Content: Thought content normal.        Judgment: Judgment normal.       Assessment & Plan:   Pulmonary hypertension Previously seen on echocardiogram Likely WHO class III related to patient's known chronic respiratory failure and very severe COPD  Plan: Emphasized importance of oxygen use   COPD, very severe ([place]) Reviewed last pulmonary function testing from May/2021 Emphasized need to have adherence with oxygen use Continue Stiolto Respimat Reviewed last eosinophil counts that were normal at 100  Plan: Continue Stiolto Respimat, samples provided today Referral to pulmonary rehab Follow-up with our office in 6 to 8 weeks Continue to work on increasing your overall physical activity Continue oxygen therapy  Chronic respiratory failure with hypoxia ([place]) Plan: Continue oxygen therapy Patient is interested in potentially purchasing a POC outright, she has tried to do this with imaging had a poor experience, she is interested in potentially doing this with a different DME company such as [employer]  Rheumatoid arthritis ([place]) Plan: Continue follow-up with [facility] rheumatology Continue  medications as managed by rheumatology  Healthcare maintenance Up-to-date with Covid vaccinations and flu vaccine    Return in about 2 months (around 06/28/2020), or if symptoms worsen  or fail to improve, for Follow up with Wyn Quaker FNP-C, [facility] - Dr. Patsey Berthold.   Lauraine Rinne, NP 04/28/2020   This appointment required 34 minutes of patient care (this includes precharting, chart review, review of results, face-to-face care, etc.).

## 2020-04-28 NOTE — Assessment & Plan Note (Signed)
Plan: Continue follow-up with [facility] rheumatology Continue medications as managed by rheumatology

## 2020-04-28 NOTE — Patient Instructions (Addendum)
You were seen today by Lauraine Rinne, NP  for:   1. COPD, very severe ([place])  We will refer you to pulmonary rehab at [facility]  Stiolto Respimat inhaler >>>2 puffs daily >>>Take this no matter what >>>This is not a rescue inhaler  Only use your albuterol as a rescue medication to be used if you can't catch your breath by resting or doing a relaxed purse lip breathing pattern.  - The less you use it, the better it will work when you need it. - Ok to use up to 2 puffs  every 4 hours if you must but call for immediate appointment if use goes up over your usual need - Don't leave home without it !!  (think of it like the spare tire for your car)   Note your daily symptoms > remember "red flags" for COPD:   >>>Increase in cough >>>increase in sputum production >>>increase in shortness of breath or activity  intolerance.   If you notice these symptoms, please call the office to be seen.    2. Chronic respiratory failure with hypoxia (HCC)  Walk today for POC   Continue oxygen therapy as prescribed  >>>maintain oxygen saturations greater than 88 percent  >>>if unable to maintain oxygen saturations please contact the office  >>>do not smoke with oxygen  >>>can use nasal saline gel or nasal saline rinses to moisturize nose if oxygen causes dryness  3. Pulmonary hypertension ([place])  This references elevated pressures in your heart  This is likely due to the severity of your COPD as well as your chronic respiratory failure needing oxygen  4. Rheumatoid arthritis, involving unspecified site, unspecified whether rheumatoid factor present (HCC)  Continue Plaquenil Continue Enbrel  Continue follow-up with rheumatology  5. Healthcare maintenance  Great job being up-to-date on your flu vaccine as well as your COVID-19 vaccinations    Follow Up:    Return in about 2 months (around 06/28/2020), or if symptoms worsen or fail to improve, for Follow up with Wyn Quaker FNP-C,  [facility] - Dr. Patsey Berthold.   Notification of test results are managed in the following manner: If there are  any recommendations or changes to the  plan of care discussed in office today,  we will contact you and let you know what they are. If you do not hear from [place], then your results are normal and you can view them through your  MyChart account , or a letter will be sent to you. Thank you again for trusting [place] with your care  - Thank you, [facility]    It is flu season:   >>> Best ways to protect herself from the flu: Receive the yearly flu vaccine, practice good hand hygiene washing with soap and also using hand sanitizer when available, eat a nutritious meals, get adequate rest, hydrate appropriately       Please contact the office if your symptoms worsen or you have concerns that you are not improving.   Thank you for choosing [facility] for your healthcare, and for allowing [place] to partner with you on your healthcare journey. I am thankful to be able to provide care to you today.   Wyn Quaker FNP-C    COPD and Physical Activity Chronic obstructive pulmonary disease (COPD) is a long-term (chronic) condition that affects the lungs. COPD is a general term that can be used to describe many different lung problems that cause lung swelling (inflammation) and limit airflow, including chronic  bronchitis and emphysema. The main symptom of COPD is shortness of breath, which makes it harder to do even simple tasks. This can also make it harder to exercise and be active. Talk with your health care provider about treatments to help you breathe better and actions you can take to prevent breathing problems during physical activity. What are the benefits of exercising with COPD? Exercising regularly is an important part of a healthy lifestyle. You can still exercise and do physical activities even though you have COPD. Exercise and physical activity improve your shortness  of breath by increasing blood flow (circulation). This causes your heart to pump more oxygen through your body. Moderate exercise can improve your:  Oxygen use.  Energy level.  Shortness of breath.  Strength in your breathing muscles.  Heart health.  Sleep.  Self-esteem and feelings of self-worth.  Depression, stress, and anxiety levels. Exercise can benefit everyone with COPD. The severity of your disease may affect how hard you can exercise, especially at first, but everyone can benefit. Talk with your health care provider about how much exercise is safe for you, and which activities and exercises are safe for you. What actions can I take to prevent breathing problems during physical activity?  Sign up for a pulmonary rehabilitation program. This type of program may include: ? Education about lung diseases. ? Exercise classes that teach you how to exercise and be more active while improving your breathing. This usually involves:  Exercise using your lower extremities, such as a stationary bicycle.  About 30 minutes of exercise, 2 to 5 times per week, for 6 to 12 weeks  Strength training, such as push ups or leg lifts. ? Nutrition education. ? Group classes in which you can talk with others who also have COPD and learn ways to manage stress.  If you use an oxygen tank, you should use it while you exercise. Work with your health care provider to adjust your oxygen for your physical activity. Your resting flow rate is different from your flow rate during physical activity.  While you are exercising: ? Take slow breaths. ? Pace yourself and do not try to go too fast. ? Purse your lips while breathing out. Pursing your lips is similar to a kissing or whistling position. ? If doing exercise that uses a quick burst of effort, such as weight lifting:  Breathe in before starting the exercise.  Breathe out during the hardest part of the exercise (such as raising the weights). Where  to find support You can find support for exercising with COPD from:  Your health care provider.  A pulmonary rehabilitation program.  Your local health department or community health programs.  Support groups, online or in-person. Your health care provider may be able to recommend support groups. Where to find more information You can find more information about exercising with COPD from:  American Lung Association: ClassInsider.se.  COPD Foundation: https://www.rivera.net/. Contact a health care provider if:  Your symptoms get worse.  You have chest pain.  You have nausea.  You have a fever.  You have trouble talking or catching your breath.  You want to start a new exercise program or a new activity. Summary  COPD is a general term that can be used to describe many different lung problems that cause lung swelling (inflammation) and limit airflow. This includes chronic bronchitis and emphysema.  Exercise and physical activity improve your shortness of breath by increasing blood flow (circulation). This causes your heart to  provide more oxygen to your body.  Contact your health care provider before starting any exercise program or new activity. Ask your health care provider what exercises and activities are safe for you. This information is not intended to replace advice given to you by your health care provider. Make sure you discuss any questions you have with your health care provider. Document Revised: 10/10/2018 Document Reviewed: 07/13/2017 Elsevier Patient Education  2020 [employer].

## 2020-04-28 NOTE — Assessment & Plan Note (Signed)
Up-to-date with Covid vaccinations and flu vaccine

## 2020-04-28 NOTE — Assessment & Plan Note (Signed)
Plan: Continue oxygen therapy Patient is interested in potentially purchasing a POC outright, she has tried to do this with imaging had a poor experience, she is interested in potentially doing this with a different DME company such as [employer]

## 2020-04-28 NOTE — Assessment & Plan Note (Signed)
Previously seen on echocardiogram Likely WHO class III related to patient's known chronic respiratory failure and very severe COPD  Plan: Emphasized importance of oxygen use

## 2020-04-28 NOTE — Assessment & Plan Note (Signed)
Reviewed last pulmonary function testing from May/2021 Emphasized need to have adherence with oxygen use Continue Stiolto Respimat Reviewed last eosinophil counts that were normal at 100  Plan: Continue Stiolto Respimat, samples provided today Referral to pulmonary rehab Follow-up with our office in 6 to 8 weeks Continue to work on increasing your overall physical activity Continue oxygen therapy

## 2020-04-29 ENCOUNTER — Other Ambulatory Visit: Payer: Self-pay | Admitting: *Deleted

## 2020-04-29 DIAGNOSIS — J449 Chronic obstructive pulmonary disease, unspecified: Secondary | ICD-10-CM

## 2020-05-01 NOTE — Progress Notes (Signed)
Agree with the details of the visit as noted by Wyn Quaker, NP.  Renold Don, MD [facility]

## 2020-05-04 ENCOUNTER — Telehealth: Payer: Self-pay | Admitting: Pulmonary Disease

## 2020-05-04 NOTE — Telephone Encounter (Signed)
ATC Seth Bake with [facility]. Placed on hold for >54mn./  Will call back on 05/05/2020.

## 2020-05-05 NOTE — Telephone Encounter (Signed)
Spoke to [place] with main clinic and verified that patient is in fact on 2L.  Last office note has been faxed to [facility] at [phone] per Courtney's request.  Nothing further needed.

## 2020-05-05 NOTE — Telephone Encounter (Signed)
Courtney from [facility] called to confirm pt is on O2 and is requesting that an office note or rx be faxed to [phone] so they can deliver o2 to pt. Call back [phone]

## 2020-05-08 DIAGNOSIS — J449 Chronic obstructive pulmonary disease, unspecified: Secondary | ICD-10-CM | POA: Diagnosis not present

## 2020-05-15 DIAGNOSIS — J449 Chronic obstructive pulmonary disease, unspecified: Secondary | ICD-10-CM | POA: Diagnosis not present

## 2020-06-06 ENCOUNTER — Encounter: Payer: Self-pay | Admitting: Family Medicine

## 2020-06-07 DIAGNOSIS — J449 Chronic obstructive pulmonary disease, unspecified: Secondary | ICD-10-CM | POA: Diagnosis not present

## 2020-06-14 DIAGNOSIS — J449 Chronic obstructive pulmonary disease, unspecified: Secondary | ICD-10-CM | POA: Diagnosis not present

## 2020-07-08 DIAGNOSIS — J449 Chronic obstructive pulmonary disease, unspecified: Secondary | ICD-10-CM | POA: Diagnosis not present

## 2020-07-15 DIAGNOSIS — J449 Chronic obstructive pulmonary disease, unspecified: Secondary | ICD-10-CM | POA: Diagnosis not present

## 2020-07-16 ENCOUNTER — Other Ambulatory Visit: Payer: Self-pay

## 2020-07-16 ENCOUNTER — Ambulatory Visit: Payer: Medicare HMO | Admitting: Pulmonary Disease

## 2020-07-16 ENCOUNTER — Encounter: Payer: Self-pay | Admitting: Pulmonary Disease

## 2020-07-16 VITALS — BP 130/78 | HR 82 | Temp 97.1°F | Ht 63.0 in | Wt 100.4 lb

## 2020-07-16 DIAGNOSIS — D751 Secondary polycythemia: Secondary | ICD-10-CM | POA: Diagnosis not present

## 2020-07-16 DIAGNOSIS — J449 Chronic obstructive pulmonary disease, unspecified: Secondary | ICD-10-CM | POA: Diagnosis not present

## 2020-07-16 DIAGNOSIS — M069 Rheumatoid arthritis, unspecified: Secondary | ICD-10-CM | POA: Diagnosis not present

## 2020-07-16 DIAGNOSIS — R0600 Dyspnea, unspecified: Secondary | ICD-10-CM

## 2020-07-16 DIAGNOSIS — Z87891 Personal history of nicotine dependence: Secondary | ICD-10-CM

## 2020-07-16 DIAGNOSIS — I272 Pulmonary hypertension, unspecified: Secondary | ICD-10-CM | POA: Diagnosis not present

## 2020-07-16 DIAGNOSIS — R0609 Other forms of dyspnea: Secondary | ICD-10-CM

## 2020-07-16 MED ORDER — ARFORMOTEROL TARTRATE 15 MCG/2ML IN NEBU: 15.0000 ug | INHALATION_SOLUTION | Freq: Two times a day (BID) | RESPIRATORY_TRACT | 11 refills | Status: DC

## 2020-07-16 MED ORDER — ARFORMOTEROL TARTRATE 15 MCG/2ML IN NEBU
15.0000 ug | INHALATION_SOLUTION | Freq: Two times a day (BID) | RESPIRATORY_TRACT | 11 refills | Status: DC
Start: 2020-07-16 — End: 2020-07-16

## 2020-07-16 MED ORDER — BUDESONIDE 0.25 MG/2ML IN SUSP
0.2500 mg | Freq: Two times a day (BID) | RESPIRATORY_TRACT | 11 refills | Status: DC
Start: 2020-07-16 — End: 2020-07-16

## 2020-07-16 MED ORDER — SPIRIVA RESPIMAT 2.5 MCG/ACT IN AERS
2.0000 | INHALATION_SPRAY | Freq: Every day | RESPIRATORY_TRACT | 0 refills | Status: DC
Start: 2020-07-16 — End: 2020-08-27

## 2020-07-16 MED ORDER — BUDESONIDE 0.25 MG/2ML IN SUSP: 0.2500 mg | Freq: Two times a day (BID) | RESPIRATORY_TRACT | 11 refills | Status: DC

## 2020-07-16 NOTE — Progress Notes (Signed)
Subjective:    Patient ID: Carrie Hendricks, female    DOB: 12-Jan-1951, 70 y.o.   MRN: [id number]   Requesting MD/Service:MarneTower, MD Date of initial consultation:08/17/18by Dr. Alva Garnet Reason for consultation:DOE, COPD  PT PROFILE: 70 y.o.female,former smoker, referred for evaluation of progressive DOE and history of COPD. Former patient of Dr. Merton Border. Former smoker quit 2006.  DATA: 04/04/17 DXI:PJASNK hyperinflation and hyperlucency without acute findings 05/18/17 PFTs:Very severe obstruction, FEV1 0.96 L (38%), hyperinflation-TLC 6.92 L (127%), RV 4.45 L (208%), DLCO 4.8 (28%), DLCO/VA 1.35 (37%) 07/18/2019 ONO: Had overnight oximetry which shows saturations as low as 78%, qualified for nocturnal O2, qualified also for daytime O2 but refuses daytime O2 07/24/2019 2D echo: Severe pulmonary hypertension noted, grade 1 diastolic dysfunction noted, LVEF 60 to 65% 11/13/2019 PFT: FEV1 0.70 L or 28% predicted, FVC 1.88 L or 58% predicted there is hyperinflation with TLC at 134% predicted and air trapping with RV at 215% predicted.  Diffusion capacity was severely reduced at 16%, consistent with very severe COPD 10/31/2019 chest x-ray: Hyperinflation and emphysematous lung disease without active disease  HPI Carrie Hendricks is a 70 year old former smoker who presents for follow-up on the issue of dyspnea.  She has very severe COPD with PFTs as noted above.  She also has severe pulmonary hypertension by 2D echo.  She continues to have issues with severe dyspnea on exertion.  She wonders if her oxygen supplementation is enough for her.  She is currently on an Inogen POC.  She has been compliant with her oxygen.   Review of Systems A 10 point review of systems was performed and it is as noted above otherwise negative.  Patient Active Problem List   Diagnosis Date Noted  . Chronic respiratory failure with hypoxia ([place]) 04/28/2020  . Healthcare maintenance 04/28/2020  . Elevated  hemoglobin ([place]) 03/08/2019  . COPD, very severe ([place]) 08/15/2018  . Sleep disorder 10/10/2017  . Screening mammogram, encounter for 09/12/2017  . Ulcerative colitis ([place]) 02/01/2017  . Lumbar degenerative disc disease 01/20/2017  . Scoliosis 06/28/2016  . Welcome to Medicare preventive visit 06/10/2016  . Estrogen deficiency 06/10/2016  . Constipation 03/02/2016  . Underweight 03/02/2016  . Shortness of breath 05/16/2014  . History of COPD 05/16/2014  . Stress reaction 05/16/2014  . Connective tissue disease ([place]) 03/05/2014  . Pedal edema 10/15/2013  . Pulmonary hypertension ([place]) 10/10/2011  . Encounter for routine gynecological examination 07/11/2011  . Routine general medical examination at a health care facility 07/03/2011  . HEMORRHOIDS, INTERNAL, WITH BLEEDING 03/15/2010  . Essential hypertension 11/16/2006  . Rheumatoid arthritis ([place]) 11/16/2006  . Osteoporosis 11/16/2006   Allergies  Allergen Reactions  . Breztri Aerosphere [Budeson-Glycopyrrol-Formoterol]     hoarseness   Current Meds  Medication Sig  . albuterol (VENTOLIN HFA) 108 (90 Base) MCG/ACT inhaler Inhale 2 puffs into the lungs every 6 (six) hours as needed for wheezing or shortness of breath.  Marland Kitchen alendronate (FOSAMAX) 70 MG tablet Take 1 tablet by mouth once a week.  Marland Kitchen amLODipine (NORVASC) 5 MG tablet Take 1 tablet (5 mg total) by mouth daily.  . Aspirin 81 MG EC tablet Take 81 mg by mouth daily.  Marland Kitchen etanercept (ENBREL) 50 MG/ML injection Inject 50 mg into the skin once a week.  . hydrochlorothiazide (HYDRODIURIL) 25 MG tablet Take 1 tablet (25 mg total) by mouth daily.  . hydroxychloroquine (PLAQUENIL) 200 MG tablet Take 200 mg by mouth daily.  . mesalamine (LIALDA) 1.2 g EC tablet  Take 1 tablet (1.2 g total) by mouth daily with breakfast.  . Multiple Vitamin (MULTIVITAMIN) capsule Take 1 capsule by mouth daily.  . Tiotropium Bromide-Olodaterol (STIOLTO RESPIMAT) 2.5-2.5 MCG/ACT AERS Inhale 2 puffs into  the lungs daily.   Immunization History  Administered Date(s) Administered  . Fluad Quad(high Dose 65+) 03/08/2019, 04/17/2020  . Hep A / Hep B 11/03/2016, 12/06/2016  . Hepatitis B, adult 05/09/2017  . Influenza Split 04/03/2011  . Influenza Whole 04/03/2010  . Influenza, High Dose Seasonal PF 04/08/2016, 03/31/2017, 04/21/2018  . Influenza,inj,Quad PF,6+ Mos 03/05/2014  . Influenza-Unspecified 04/03/2013, 03/05/2014, 05/03/2015  . PFIZER SARS-COV-2 Vaccination 08/15/2019, 09/05/2019, 04/17/2020  . Pneumococcal Conjugate-13 06/10/2016  . Pneumococcal Polysaccharide-23 03/28/2007, 10/15/2013, 08/15/2018  . Td 06/11/2010        Objective:   Physical Exam BP 130/78 (BP Location: Left Arm, Cuff Size: Normal)   Pulse 82   Temp (!) 97.1 F (36.2 C) (Temporal)   Ht 5' 3"  (1.6 m)   Wt 100 lb 6.4 oz (45.5 kg)   SpO2 92%   BMI 17.79 kg/m  JKQ:ASUORVIFB,PPHKFEXMDYJ ill.  Use of accessories, chronic. No overt tachypnea HEENT: NCAT, scleraeanicteric Neck: No JVD, trachea midline, nocrepitus Thorax: Increased AP diameter, Hoover's sign present Lungs: breath soundsdistant throughoutwithoutwheezes nor other adventitious sounds Cardiovascular: RRR, no murmurs, distant tones Abdomen: Soft, nontender, normal BS Ext: without clubbing, cyanosis.No pedal edema noted today.  She does have stigmata of rheumatoid arthritis on her hands   Neuro: grossly intact Skin: Limited exam, no lesions noted  Ambulatory oximetry on current oxygen supplementation of 2 L/min pulsed sure that the patient was able maintain 98% saturations during ambulation with supplemental O2.     Assessment & Plan:     ICD-10-CM   1. COPD, very severe ([place])  J44.9 AMB REFERRAL FOR DME    Alpha-1 antitrypsin phenotype   Check alpha-1 phenotype, does not appear this was checked previously Referred to pulmonary rehabilitation Switch to meds via nebulizer  2. Pulmonary hypertension (HCC)  I27.20 ECHOCARDIOGRAM  COMPLETE   Recheck 2D echo Consider referral to pulmonary hypertension clinic at [facility] This could be due to COPD but patient has RA as well  3. Dyspnea on exertion  R06.00    She has very severe COPD and pulmonary hypertension No evidence of rheumatoid lung Continue oxygen supplementation Changes in meds as below  4. Rheumatoid arthritis, involving unspecified site, unspecified whether rheumatoid factor present [facility])  M06.9    This issue adds complexity to her managent May be confounding issue regarding pulmonary hypertension Continue to follow with rheumatology  5. Polycythemia secondary to hypoxia  D75.1    Continue oxygen supplementation  6. Former smoker  Z87.891    Commended on continued abstinence   Meds ordered this encounter  Medications  . DISCONTD: arformoterol (BROVANA) 15 MCG/2ML NEBU    Sig: Take 2 mLs (15 mcg total) by nebulization 2 (two) times daily.    Dispense:  120 mL    Refill:  11  . DISCONTD: budesonide (PULMICORT) 0.25 MG/2ML nebulizer solution    Sig: Take 2 mLs (0.25 mg total) by nebulization 2 (two) times daily.    Dispense:  60 mL    Refill:  11  . Tiotropium Bromide Monohydrate (SPIRIVA RESPIMAT) 2.5 MCG/ACT AERS    Sig: Inhale 2 puffs into the lungs daily for 1 day.    Dispense:  4 g    Refill:  0    Order Specific Question:   Lot Number?  Answer:   [id number]    Order Specific Question:   Expiration Date?    Answer:   05/04/2021    Order Specific Question:   Quantity    Answer:   2  . budesonide (PULMICORT) 0.25 MG/2ML nebulizer solution    Sig: Take 2 mLs (0.25 mg total) by nebulization 2 (two) times daily.    Dispense:  60 mL    Refill:  11    j44.9  . arformoterol (BROVANA) 15 MCG/2ML NEBU    Sig: Take 2 mLs (15 mcg total) by nebulization 2 (two) times daily.    Dispense:  120 mL    Refill:  11    j44.9   Orders Placed This Encounter  Procedures  . Alpha-1 antitrypsin phenotype    Standing Status:   Future    Number of Occurrences:    1    Standing Expiration Date:   07/16/2021  . AMB REFERRAL FOR DME    Referral Priority:   Routine    Referral Type:   Durable Medical Equipment Purchase    Number of Visits Requested:   1  . ECHOCARDIOGRAM COMPLETE    Standing Status:   Future    Standing Expiration Date:   01/13/2021    Order Specific Question:   Where should this test be performed    Answer:   [facility]    Order Specific Question:   Please indicate who you request to read the nuc med / echo results.    Answer:   [facility]    Order Specific Question:   Perflutren DEFINITY (image enhancing agent) should be administered unless hypersensitivity or allergy exist    Answer:   Administer Perflutren    Order Specific Question:   Reason for exam-Echo    Answer:   Pulmonary hypertension  416.8 / I27.2   Smoking cessation instruction/counseling given:  commended patient for quitting and reviewed strategies for preventing relapses.  Discussion: Patient appears not to be getting maximal benefit from her current inhaler therapy.  I suspect that she does not have breath-holding capacity for this.  We will switch her to medications be nebulizer with Brovana and Pulmicort twice a day.  Maretta Bees is not covered by her insurance company so she will have to continue using LAMA in the form of Spiriva Respimat.  She should use this medication after one of her nebulizer treatments.  We will review her pulmonary hypertension with 2D echo.  She may need right heart cath, the patient would be interested in a referral to [facility] pulmonary hypertension clinic if further invasive procedures would be needed.  We are also ordering alpha-1 antitrypsin level, on my review of her records I do not see that this was performed previously.   Ultimately I think that her dyspnea is multifactorial.  Definitely pulmonary hypertension plays a role the issue would be is the pulmonary hypertension solely from COPD and chronic hypoxic vasoconstriction or is  there an element from her rheumatoid arthritis.  Would determine whether vasodilator therapy may be of use as it could be detrimental with its only due to COPD.  Additionally her dyspnea could be related to her significant air trapping.  Hopefully the medication changes as noted above will help with this.  Follow-up will be in 4-6 weeks time she is to contact us prior to that time should any new difficulties arise.  Renold Don, MD [facility]   *This note was dictated using voice recognition software/Dragon.  Despite best efforts to proofread, errors can occur which can change the meaning.  Any change was purely unintentional.

## 2020-07-16 NOTE — Patient Instructions (Signed)
We are going to try Brovana (alformoterol) and Pulmicort (budesonide) via nebulizer twice a day.  Use the Brovana first followed by the Pulmicort.  The medication should not be mixed as they have a tendency to foam when mixed together.  When using those 2 medications you should not use Stiolto.  We have switched to Stiolto to Spiriva 2 puffs once a day.  You can do it after your first treatment of the morning or evening but make sure that its at around the same time every day.  We are going to get another echocardiogram to reevaluate her pulmonary hypertension.  Depending on the results of the echocardiogram will consider referral to the [facility] pulmonary hypertension clinic.  The issue would be whether he would benefit from other medications given your rheumatoid arthritis.  Will see you in follow-up here in 4 to 6 weeks time call sooner should any new problems arise.  Your oxygen level remained good with the supplemental oxygen today.

## 2020-07-17 ENCOUNTER — Other Ambulatory Visit
Admission: RE | Admit: 2020-07-17 | Discharge: 2020-07-17 | Disposition: A | Discharge status: Home / Self Care | Payer: Medicare HMO | Attending: Pulmonary Disease | Admitting: Pulmonary Disease

## 2020-07-17 ENCOUNTER — Telehealth: Payer: Self-pay | Admitting: Pulmonary Disease

## 2020-07-17 DIAGNOSIS — J449 Chronic obstructive pulmonary disease, unspecified: Secondary | ICD-10-CM | POA: Diagnosis not present

## 2020-07-17 NOTE — Telephone Encounter (Signed)
Spoke to [place] with [facility], who stated that Garlon Hatchet is requiring a PA. [place] is going to fax over PA request.

## 2020-07-21 ENCOUNTER — Encounter: Payer: Self-pay | Admitting: Pulmonary Disease

## 2020-07-21 LAB — ALPHA-1 ANTITRYPSIN PHENOTYPE: A-1 Antitrypsin, Ser: 127 mg/dL (ref 101–187)

## 2020-07-22 ENCOUNTER — Telehealth: Payer: Self-pay | Admitting: Pulmonary Disease

## 2020-07-22 NOTE — Telephone Encounter (Signed)
PA form has been received and placed in Dr. Domingo Dimes look at folder for review.

## 2020-07-22 NOTE — Telephone Encounter (Addendum)
Spoke to [place] with [facility] and started PA [place].  Per Jacqulyn Bath, our office will receive a call from [facility] pharmacy team to complete PA.   PA # [phone]  Will await call.

## 2020-07-22 NOTE — Telephone Encounter (Signed)
Please see 07/17/2020 phone note.  PA is pending.  Carrie Hendricks with [facility] is aware and voiced her understanding.  Nothing further needed.

## 2020-07-23 MED ORDER — ARFORMOTEROL TARTRATE 15 MCG/2ML IN NEBU: 15.0000 ug | INHALATION_SOLUTION | Freq: Two times a day (BID) | RESPIRATORY_TRACT | 3 refills | Status: DC

## 2020-07-23 NOTE — Telephone Encounter (Signed)
PA form has been completed and faxed to [facility] at [phone].

## 2020-07-23 NOTE — Telephone Encounter (Signed)
Received approval for Brovana through medicare part B.  Verdis Frederickson with [facility] is aware approval. Verdis Frederickson stated that [place] can not file under part B, therefore this medication will need to go through local pharmacy.  I have spoken to patient and made her aware. Rx for Garlon Hatchet has been sent to [facility] per patient request.  Nothing further needed at this time.

## 2020-07-27 ENCOUNTER — Telehealth: Payer: Self-pay | Admitting: Pulmonary Disease

## 2020-07-27 MED ORDER — BUDESONIDE 0.25 MG/2ML IN SUSP: 0.2500 mg | Freq: Two times a day (BID) | RESPIRATORY_TRACT | 3 refills | Status: DC

## 2020-07-27 NOTE — Telephone Encounter (Signed)
Please refer to 07/17/2020 phone note.   Spoke to patient, who states that she received a call from [facility] that she would be receiving both brovana and pulmicort from them. On 07/23/2020, I spoke to [place] at [facility] and was advised that Garlon Hatchet would have to go thru a local pharmacy, as they could not file under part B. Garlon Hatchet was sent to [facility] on 07/23/2020 per patient request.  Patient would like to receive both brovana and Pulmicort from [facility].  Rx for Pulmicort has been sent to [facility] per patient request.  Larkin Ina with [facility] is aware to cancel Rx.  Nothing further needed at this time.

## 2020-08-04 ENCOUNTER — Telehealth: Payer: Self-pay | Admitting: Pulmonary Disease

## 2020-08-04 NOTE — Telephone Encounter (Signed)
Patient is aware of below recommendations. She voiced her understanding and had no further questions.  Nothing further needed at this time.   Will forward to Dr. Patsey Berthold as an Juluis Rainier.

## 2020-08-04 NOTE — Telephone Encounter (Signed)
08/04/20  These are not typical symptoms regarding Pulmicort and Brovana.  Spiriva will not acutely treat this as this is a long-acting muscarinic.  Would recommend that patient continue to monitor symptoms. Would not change maintenance inhaler/nebulizers at this point in time.  Please also route this message to Dr. Patsey Berthold that she last saw the patient. Believe at this point time is still reasonable for the patient to be maintained on Brovana, Pulmicort as well as Spiriva Respimat.  Wyn Quaker FNP

## 2020-08-04 NOTE — Telephone Encounter (Signed)
[facility] patient last seen 07/16/2020 for copd.   Spoke to patient, who reports of increased sob and wheezing.  Denied cough, f/s/c or additional symptoms.  Patient stated pulmicort and Brovana today. Sx developed 83mn after treatment.  She has not used albuterol.  She used spiriva with no relief in sx.  She has had both covid vaccines and booster and flu shot.   BAaron Edelman please advise as Dr. GPatsey Bertholdis unavailable. Thanks

## 2020-08-05 ENCOUNTER — Telehealth: Payer: Self-pay | Admitting: Pulmonary Disease

## 2020-08-05 NOTE — Telephone Encounter (Signed)
Check to see how she is doing today.  May need prednisone taper.

## 2020-08-05 NOTE — Telephone Encounter (Signed)
Spoke to patient.  Patient stated that her breathing is doing well. She is not having any symptoms today.  Will close encounter.

## 2020-08-05 NOTE — Telephone Encounter (Signed)
Lm for patient.  

## 2020-08-05 NOTE — Telephone Encounter (Signed)
Nothing further needed.//TM

## 2020-08-06 ENCOUNTER — Other Ambulatory Visit: Payer: Self-pay

## 2020-08-06 ENCOUNTER — Ambulatory Visit (INDEPENDENT_AMBULATORY_CARE_PROVIDER_SITE_OTHER): Payer: Medicare HMO

## 2020-08-06 DIAGNOSIS — I272 Pulmonary hypertension, unspecified: Secondary | ICD-10-CM

## 2020-08-06 LAB — ECHOCARDIOGRAM COMPLETE
Area-P 1/2: 3.4 cm2
S' Lateral: 2.1 cm

## 2020-08-06 NOTE — Telephone Encounter (Signed)
Please see 08/05/2020 phone note.

## 2020-08-07 ENCOUNTER — Telehealth: Payer: Self-pay | Admitting: Pulmonary Disease

## 2020-08-07 NOTE — Telephone Encounter (Signed)
Tyler Pita, MD  08/07/2020 10:19 AM EST      Lets stick to the Brovana and Pulmicort for now. Put the Spiriva aside. We can give her a trial of Yupelri if her symptoms worsen off of the Spiriva.    Please refer to 08/06/2020 echo result. Patient is aware of above recommendations.  She stated that she used 1 puff of spiriva this morning with no symptoms. She will continue with 1 puff BID. I have made Dr. Patsey Berthold aware of this verbally.  Nothing further needed at this time.

## 2020-08-08 DIAGNOSIS — J449 Chronic obstructive pulmonary disease, unspecified: Secondary | ICD-10-CM | POA: Diagnosis not present

## 2020-08-13 DIAGNOSIS — I7300 Raynaud's syndrome without gangrene: Secondary | ICD-10-CM | POA: Diagnosis not present

## 2020-08-13 DIAGNOSIS — Z79899 Other long term (current) drug therapy: Secondary | ICD-10-CM | POA: Diagnosis not present

## 2020-08-13 DIAGNOSIS — M810 Age-related osteoporosis without current pathological fracture: Secondary | ICD-10-CM | POA: Diagnosis not present

## 2020-08-13 DIAGNOSIS — M48062 Spinal stenosis, lumbar region with neurogenic claudication: Secondary | ICD-10-CM | POA: Diagnosis not present

## 2020-08-13 DIAGNOSIS — J431 Panlobular emphysema: Secondary | ICD-10-CM | POA: Diagnosis not present

## 2020-08-13 DIAGNOSIS — D582 Other hemoglobinopathies: Secondary | ICD-10-CM | POA: Diagnosis not present

## 2020-08-13 DIAGNOSIS — K51919 Ulcerative colitis, unspecified with unspecified complications: Secondary | ICD-10-CM | POA: Diagnosis not present

## 2020-08-13 DIAGNOSIS — M0579 Rheumatoid arthritis with rheumatoid factor of multiple sites without organ or systems involvement: Secondary | ICD-10-CM | POA: Diagnosis not present

## 2020-08-15 DIAGNOSIS — J449 Chronic obstructive pulmonary disease, unspecified: Secondary | ICD-10-CM | POA: Diagnosis not present

## 2020-08-17 DIAGNOSIS — J449 Chronic obstructive pulmonary disease, unspecified: Secondary | ICD-10-CM | POA: Diagnosis not present

## 2020-08-21 ENCOUNTER — Other Ambulatory Visit: Payer: Self-pay | Admitting: Family Medicine

## 2020-08-27 ENCOUNTER — Telehealth: Payer: Self-pay | Admitting: Pulmonary Disease

## 2020-08-27 ENCOUNTER — Ambulatory Visit: Payer: Medicare HMO | Admitting: Pulmonary Disease

## 2020-08-27 ENCOUNTER — Other Ambulatory Visit: Payer: Self-pay

## 2020-08-27 ENCOUNTER — Encounter: Payer: Self-pay | Admitting: Pulmonary Disease

## 2020-08-27 VITALS — BP 144/78 | HR 66 | Temp 97.7°F | Ht 63.0 in | Wt 100.4 lb

## 2020-08-27 DIAGNOSIS — D751 Secondary polycythemia: Secondary | ICD-10-CM

## 2020-08-27 DIAGNOSIS — J9611 Chronic respiratory failure with hypoxia: Secondary | ICD-10-CM | POA: Diagnosis not present

## 2020-08-27 DIAGNOSIS — R0600 Dyspnea, unspecified: Secondary | ICD-10-CM

## 2020-08-27 DIAGNOSIS — J449 Chronic obstructive pulmonary disease, unspecified: Secondary | ICD-10-CM | POA: Diagnosis not present

## 2020-08-27 DIAGNOSIS — M069 Rheumatoid arthritis, unspecified: Secondary | ICD-10-CM | POA: Diagnosis not present

## 2020-08-27 DIAGNOSIS — R0609 Other forms of dyspnea: Secondary | ICD-10-CM

## 2020-08-27 DIAGNOSIS — I272 Pulmonary hypertension, unspecified: Secondary | ICD-10-CM

## 2020-08-27 MED ORDER — BREZTRI AEROSPHERE 160-9-4.8 MCG/ACT IN AERO: 2.0000 | INHALATION_SPRAY | Freq: Two times a day (BID) | RESPIRATORY_TRACT | 0 refills | Status: AC

## 2020-08-27 NOTE — Patient Instructions (Signed)
We will retry Breztri 2 puffs twice a day, use the spacer.  Also rinse your mouth well after you use it, put a little bit of baking soda in the rinse water.  DO NOT USE BROVANA (ARFORMOTEROL), PULMICORT (BUDESONIDE), OR SPIRIVA WHILE USING THE BREZTRI.  We will see you in follow-up in 4 to 6 weeks time call sooner should any new problems arise.  Let us know how you do with the Judithann Sauger so we can call it in for you.

## 2020-08-27 NOTE — Telephone Encounter (Signed)
Spoke to patient, who is questioning if she can use the Breztri inhaler that she already has at home. Inhaler expires 12/2020. She is aware that she can use this inhaler.  She voiced her understanding.  Nothing further needed at this time.

## 2020-08-27 NOTE — Progress Notes (Signed)
Subjective:    Patient ID: Carrie Hendricks, female    DOB: [birth year], 70 y.o.   MRN: [id number]  Requesting MD/Service:MarneTower, MD Date of initial consultation:08/17/18by Dr. Alva Garnet Reason for consultation:DOE, COPD  PT PROFILE: 70 y.o.female,former smoker,referred for evaluation of progressive DOE and history of COPD. Former patient of Dr. Merton Border.Former smoker quit 2006.  DATA: 04/04/17 WOE:HOZYYQ hyperinflation and hyperlucency without acute findings 05/18/17 PFTs:Very severe obstruction, FEV1 0.96 L (38%), hyperinflation-TLC 6.92 L (127%), RV 4.45 L (208%), DLCO 4.8 (28%), DLCO/VA 1.35 (37%) 07/18/2019 ONO:Had overnight oximetry which shows saturations as low as 78%, qualified for nocturnal O2, qualified also for daytime O2 but refuses daytime O2 07/24/2019 2D echo: Severe pulmonary hypertension noted (76 mmHg), grade 1 diastolic dysfunction noted, LVEF 60 to 65% 11/13/2019 PFT: FEV1 0.70 L or 28% predicted, FVC 1.88 L or 58% predicted there is hyperinflation with TLC at 134% predicted and air trapping with RV at 215% predicted.  Diffusion capacity was severely reduced at 16%, consistent with very severe COPD 10/31/2019 chest x-ray: Hyperinflation and emphysematous lung disease without active disease 05/17/2020 alpha-1: Phenotype MM, normal.  Level 127 mg/dL 08/06/2020 2D echo: LVEF 60 to 65%.  Grade I DD, mild MR.  Right ventricular systolic pressure estimated 58.8 mmHg (previously 76 mmHg).  HPI Carrie Hendricks presents for follow-up.  As noted she is a former smoker with very severe COPD and chronic hypoxic respiratory failure.  She has moderate pulmonary hypertension.  Pulmonary hypertension has improved with supplemental oxygen.  She is compliant with nebulizer therapy and with supplemental oxygen.  She does not like the nebulizer because it makes her feel "foggy in the head".  She would like to [employer].  Previously she had some minor issues with hoarseness  with the [facility] but she admits that she was not rinsing her mouth well.  This is a common side effect that usually responds well to rinsing mouth after use.  She states her breathing was better with the [facility].  She continues to have poor insight with regards to the severity of her disease.  Most recent 2D echo however did show that her pulmonary hypertension is responding to  oxygen supplementation.  She presents today with her daughter.  I have recommended that she consider retiring however the patient is insistent that she wants to continue working.  She does note that when she goes to work she gets more short of breath.  We even discussed possibilities of working out of the home.  She seems to be very reluctant to change her activities of daily living.  Again this is related to poor insight as to how severe her disease is.  She is also not willing to entertain end-of-life issues discussion at present.  She voices no new complaint today.   Review of Systems A 10 point review of systems was performed and it is as noted above otherwise negative.  Patient Active Problem List   Diagnosis Date Noted  . Chronic respiratory failure with hypoxia ([place]) 04/28/2020  . Healthcare maintenance 04/28/2020  . Elevated hemoglobin ([place]) 03/08/2019  . COPD, very severe ([place]) 08/15/2018  . Sleep disorder 10/10/2017  . Screening mammogram, encounter for 09/12/2017  . Ulcerative colitis ([place]) 02/01/2017  . Lumbar degenerative disc disease 01/20/2017  . Scoliosis 06/28/2016  . Welcome to Medicare preventive visit 06/10/2016  . Estrogen deficiency 06/10/2016  . Constipation 03/02/2016  . Underweight 03/02/2016  . Shortness of breath 05/16/2014  . History of COPD 05/16/2014  . Stress  reaction 05/16/2014  . Connective tissue disease ([place]) 03/05/2014  . Pedal edema 10/15/2013  . Pulmonary hypertension ([place]) 10/10/2011  . Encounter for routine gynecological examination 07/11/2011  . Routine general medical  examination at a health care facility 07/03/2011  . HEMORRHOIDS, INTERNAL, WITH BLEEDING 03/15/2010  . Essential hypertension 11/16/2006  . Rheumatoid arthritis ([place]) 11/16/2006  . Osteoporosis 11/16/2006   No Active Allergies   Current Meds  Medication Sig  . albuterol (VENTOLIN HFA) 108 (90 Base) MCG/ACT inhaler Inhale 2 puffs into the lungs every 6 (six) hours as needed for wheezing or shortness of breath.  Marland Kitchen alendronate (FOSAMAX) 70 MG tablet Take 1 tablet by mouth once a week.  Marland Kitchen amLODipine (NORVASC) 5 MG tablet TAKE 1 TABLET EVERY DAY  . arformoterol (BROVANA) 15 MCG/2ML NEBU Take 2 mLs (15 mcg total) by nebulization 2 (two) times daily.  . Aspirin 81 MG EC tablet Take 81 mg by mouth daily.  . budesonide (PULMICORT) 0.25 MG/2ML nebulizer solution Take 2 mLs (0.25 mg total) by nebulization 2 (two) times daily.  Marland Kitchen etanercept (ENBREL) 50 MG/ML injection Inject 50 mg into the skin once a week.  . hydrochlorothiazide (HYDRODIURIL) 25 MG tablet TAKE 1 TABLET EVERY DAY  . hydroxychloroquine (PLAQUENIL) 200 MG tablet Take 200 mg by mouth daily.  . mesalamine (LIALDA) 1.2 g EC tablet Take 1 tablet (1.2 g total) by mouth daily with breakfast.  . Multiple Vitamin (MULTIVITAMIN) capsule Take 1 capsule by mouth daily.    Immunization History  Administered Date(s) Administered  . Fluad Quad(high Dose 65+) 03/08/2019, 04/17/2020  . Hep A / Hep B 11/03/2016, 12/06/2016  . Hepatitis B, adult 05/09/2017  . Influenza Split 04/03/2011  . Influenza Whole 04/03/2010  . Influenza, High Dose Seasonal PF 04/08/2016, 03/31/2017, 04/21/2018  . Influenza,inj,Quad PF,6+ Mos 03/05/2014  . Influenza-Unspecified 04/03/2013, 03/05/2014, 05/03/2015  . PFIZER(Purple Top)SARS-COV-2 Vaccination 08/15/2019, 09/05/2019, 04/17/2020  . Pneumococcal Conjugate-13 06/10/2016  . Pneumococcal Polysaccharide-23 03/28/2007, 10/15/2013, 08/15/2018  . Td 06/11/2010       Objective:   Physical Exam  BP (!) 144/78 (BP  Location: Left Arm, Cuff Size: Normal)   Pulse 66   Temp 97.7 F (36.5 C) (Temporal)   Ht 5' 3"  (1.6 m)   Wt 100 lb 6.4 oz (45.5 kg)   SpO2 92%   BMI 17.79 kg/m   XBL:[id number],RAQTMAUQJFH ill.  Use of accessories, chronic. No overt tachypnea HEENT: NCAT, scleraeanicteric Neck: No JVD, trachea midline, nocrepitus Thorax: Increased AP diameter, Hoover'ssign present Lungs: breath soundsdistant throughoutwithoutwheezes nor other adventitious sounds Cardiovascular: RRR, no murmurs, distant tones Abdomen: Soft, nontender, normal BS Ext: without clubbing, cyanosis.No pedal edema noted today.  She does have stigmata of rheumatoid arthritis on her hands/wrists Neuro: grossly intact Skin: Limited exam, no lesions noted      Assessment & Plan:     ICD-10-CM   1. COPD, very severe ([place])  J44.9    Patient wants to retry Maximiano Coss 2 puffs twice a day with spacer Rinse mouth well after use Discontinue nebulizer meds and Spiriva  2. Pulmonary hypertension ([facility])  I27.20    Continue supplemental oxygen Secondary pulmonary hypertension due to severe COPD Chronic hypoxic vasoconstriction  3. Dyspnea on exertion  R06.00    She has been enrolled in pulmonary rehab Most limiting symptom  4. Chronic respiratory failure with hypoxia ([facility])  J96.11    Continue supplemental oxygen 24/7  5. Rheumatoid arthritis, involving unspecified site, unspecified whether rheumatoid factor present ([place])  M06.9  This issue adds complexity to her management Continue follow-up by rheumatology  6. 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 Budeson-Glycopyrrol-Formoterol (BREZTRI AEROSPHERE) 160-9-4.8 MCG/ACT AERO    Sig: Inhale 2 puffs into the lungs in the morning and at bedtime for 1 day.    Dispense:  5.9 g    Refill:  0    Order Specific Question:   Lot Number?    Answer:   [id number]    Order Specific Question:    Expiration Date?    Answer:   02/01/2022    Order Specific Question:   Manufacturer?    Answer:   [employer] [[id number]]    Order Specific Question:   Quantity    Answer:   2    Discussion:  Patient continues to complain bitterly of dyspnea.  She is end-stage COPD and actually would benefit from palliation however she has very poor insight with regards to her severity of disease she seemed to do better with Breztri so we will proceed with providing her 2 puffs twice a day of Breztri with a spacer was also instructed on proper rinsing technique.  Previously she had had some hoarseness with the medication however this is a self-limiting side effect that should respond well to rinsing mouth after use.  She admits that she was not doing this.  We provided her with samples and a spacer.  She was instructed to discontinue all nebulizer medications and Spiriva.  He has not started pulmonary rehab yet however she has been enrolled and is to be notified by them when to start.  Patient is to call us if the Judithann Sauger is working for her so we can call in a prescription for her.  We will see her in follow-up in 4 to 6 weeks time she is to contact us prior to that time should any new difficulties arise.  Renold Don, MD [facility]   *This note was dictated using voice recognition software/Dragon.  Despite best efforts to proofread, errors can occur which can change the meaning.  Any change was purely unintentional.

## 2020-08-28 ENCOUNTER — Other Ambulatory Visit: Payer: Self-pay | Admitting: Gastroenterology

## 2020-08-31 ENCOUNTER — Encounter: Payer: Medicare HMO | Attending: Pulmonary Disease | Admitting: *Deleted

## 2020-08-31 ENCOUNTER — Other Ambulatory Visit: Payer: Self-pay

## 2020-08-31 DIAGNOSIS — I272 Pulmonary hypertension, unspecified: Secondary | ICD-10-CM

## 2020-08-31 NOTE — Progress Notes (Signed)
Initial telephone orientation completed. Diagnosis can be found in [facility] on 1/13. EP orientation scheduled for Thursday 3/3 at 3pm.

## 2020-09-03 ENCOUNTER — Encounter: Payer: Medicare HMO | Attending: Pulmonary Disease

## 2020-09-03 ENCOUNTER — Other Ambulatory Visit: Payer: Self-pay

## 2020-09-03 VITALS — Ht 65.75 in | Wt 100.5 lb

## 2020-09-03 DIAGNOSIS — Z87891 Personal history of nicotine dependence: Secondary | ICD-10-CM | POA: Diagnosis not present

## 2020-09-03 DIAGNOSIS — Z79899 Other long term (current) drug therapy: Secondary | ICD-10-CM | POA: Insufficient documentation

## 2020-09-03 DIAGNOSIS — Z7982 Long term (current) use of aspirin: Secondary | ICD-10-CM | POA: Insufficient documentation

## 2020-09-03 DIAGNOSIS — Z7901 Long term (current) use of anticoagulants: Secondary | ICD-10-CM | POA: Insufficient documentation

## 2020-09-03 DIAGNOSIS — I272 Pulmonary hypertension, unspecified: Secondary | ICD-10-CM | POA: Insufficient documentation

## 2020-09-03 NOTE — Progress Notes (Signed)
Pulmonary Individual Treatment Plan  Patient Details  Name: Carrie Hendricks MRN: [id number] Date of Birth: [birth year] Referring Provider:   Flowsheet Row Pulmonary Rehab from 09/03/2020 in [facility] Cardiac and Pulmonary Rehab  Referring Provider Vernard Gambles MD      Initial Encounter Date:  Flowsheet Row Pulmonary Rehab from 09/03/2020 in [facility] Cardiac and Pulmonary Rehab  Date 09/03/20      Visit Diagnosis: Pulmonary hypertension ([place])  Patient's Home Medications on Admission:  Current Outpatient Medications:  .  albuterol (VENTOLIN HFA) 108 (90 Base) MCG/ACT inhaler, Inhale 2 puffs into the lungs every 6 (six) hours as needed for wheezing or shortness of breath., Disp: 54 g, Rfl: 1 .  alendronate (FOSAMAX) 70 MG tablet, Take 1 tablet by mouth once a week., Disp: , Rfl:  .  amLODipine (NORVASC) 5 MG tablet, TAKE 1 TABLET EVERY DAY, Disp: 90 tablet, Rfl: 0 .  Aspirin 81 MG EC tablet, Take 81 mg by mouth daily., Disp: , Rfl:  .  Budeson-Glycopyrrol-Formoterol (BREZTRI AEROSPHERE) 160-9-4.8 MCG/ACT AERO, Inhale into the lungs., Disp: , Rfl:  .  etanercept (ENBREL) 50 MG/ML injection, Inject 50 mg into the skin once a week., Disp: , Rfl:  .  hydrochlorothiazide (HYDRODIURIL) 25 MG tablet, TAKE 1 TABLET EVERY DAY, Disp: 90 tablet, Rfl: 0 .  hydroxychloroquine (PLAQUENIL) 200 MG tablet, Take 200 mg by mouth daily., Disp: , Rfl:  .  mesalamine (LIALDA) 1.2 g EC tablet, Take 1 tablet (1.2 g total) by mouth daily with breakfast., Disp: 90 tablet, Rfl: 3 .  Multiple Vitamin (MULTIVITAMIN) capsule, Take 1 capsule by mouth daily., Disp: , Rfl:   Past Medical History: Past Medical History:  Diagnosis Date  . Arthritis    rheumatoid  . Complication of anesthesia   . Hypertension   . Osteoporosis   . PONV (postoperative nausea and vomiting)   . Pulmonary hypertension ([place])   . Rheumatoid arthritis(714.0)    ? scleroderma    Tobacco Use: Social History   Tobacco Use  Smoking Status  Former Smoker  . Packs/day: 1.25  . Years: 30.00  . Pack years: 37.50  . Quit date: 2006  . Years since quitting: 16.1  Smokeless Tobacco Never Used  Tobacco Comment   Quit in 8/07    Labs: Recent Review Flowsheet Data    Labs for ITP Cardiac and Pulmonary Rehab Latest Ref Rng & Units 10/15/2013 03/05/2014 05/15/2015 05/25/2016 08/14/2018   Cholestrol 0 - 200 mg/dL 178 195 177 197 186   LDLCALC 0 - 99 mg/dL 95 113(H) 93 118(H) 105(H)   HDL >39.00 mg/dL 65.50 69.80 70 64.30 63.30   Trlycerides 0.0 - 149.0 mg/dL 86.0 63.0 69 76.0 91.0       Pulmonary Assessment Scores:  Pulmonary Assessment Scores    Row Name 09/03/20 1718         ADL UCSD   ADL Phase Entry     SOB Score total 54     Rest 1     Walk 3     Stairs 5     Bath 2     Dress 3     Shop 3           CAT Score   CAT Score 27           mMRC Score   mMRC Score 2            UCSD: Self-administered rating of dyspnea associated with activities of daily living (ADLs) 6-point  scale (0 = "not at all" to 5 = "maximal or unable to do because of breathlessness")  Scoring Scores range from 0 to 120.  Minimally important difference is 5 units  CAT: CAT can identify the health impairment of COPD patients and is better correlated with disease progression.  CAT has a scoring range of zero to 40. The CAT score is classified into four groups of low (less than 10), medium (10 - 20), high (21-30) and very high (31-40) based on the impact level of disease on health status. A CAT score over 10 suggests significant symptoms.  A worsening CAT score could be explained by an exacerbation, poor medication adherence, poor inhaler technique, or progression of COPD or comorbid conditions.  CAT MCID is 2 points  mMRC: mMRC (Modified Medical Research Council) Dyspnea Scale is used to assess the degree of baseline functional disability in patients of respiratory disease due to dyspnea. No minimal important difference is established. A  decrease in score of 1 point or greater is considered a positive change.   Pulmonary Function Assessment:   Exercise Target Goals: Exercise Program Goal: Individual exercise prescription set using results from initial 6 min walk test and THRR while considering  patient's activity barriers and safety.   Exercise Prescription Goal: Initial exercise prescription builds to 30-45 minutes a day of aerobic activity, 2-3 days per week.  Home exercise guidelines will be given to patient during program as part of exercise prescription that the participant will acknowledge.  Education: Aerobic Exercise: - Group verbal and visual presentation on the components of exercise prescription. Introduces F.I.T.T principle from ACSM for exercise prescriptions.  Reviews F.I.T.T. principles of aerobic exercise including progression. Written material given at graduation.   Education: Resistance Exercise: - Group verbal and visual presentation on the components of exercise prescription. Introduces F.I.T.T principle from ACSM for exercise prescriptions  Reviews F.I.T.T. principles of resistance exercise including progression. Written material given at graduation.    Education: Exercise & Equipment Safety: - Individual verbal instruction and demonstration of equipment use and safety with use of the equipment. Flowsheet Row Pulmonary Rehab from 09/03/2020 in [facility] Cardiac and Pulmonary Rehab  Education need identified 09/03/20  Date 09/03/20  Educator [place]  Instruction Review Code 1- Verbalizes Understanding      Education: Exercise Physiology & General Exercise Guidelines: - Group verbal and written instruction with models to review the exercise physiology of the cardiovascular system and associated critical values. Provides general exercise guidelines with specific guidelines to those with heart or lung disease.    Education: Flexibility, Balance, Mind/Body Relaxation: - Group verbal and visual presentation with  interactive activity on the components of exercise prescription. Introduces F.I.T.T principle from ACSM for exercise prescriptions. Reviews F.I.T.T. principles of flexibility and balance exercise training including progression. Also discusses the mind body connection.  Reviews various relaxation techniques to help reduce and manage stress (i.e. Deep breathing, progressive muscle relaxation, and visualization). Balance handout provided to take home. Written material given at graduation.   Activity Barriers & Risk Stratification:  Activity Barriers & Cardiac Risk Stratification - 09/03/20 1705      Activity Barriers & Cardiac Risk Stratification   Activity Barriers Arthritis;Shortness of Breath;Back Problems;Deconditioning;Other (comment)    Comments Sciatica, DDD           6 Minute Walk:  6 Minute Walk    Row Name 09/03/20 1711         6 Minute Walk   Phase Initial     Distance  400 feet     Walk Time 3 minutes     # of Rest Breaks 0     MPH 1.51     METS 1.91     RPE 13     Perceived Dyspnea  1     VO2 Peak 6.69     Symptoms No     Resting HR 77 bpm     Resting BP 126/72     Resting Oxygen Saturation  98 %     Exercise Oxygen Saturation  during 6 min walk 80 %  Stopped test     Max Ex. HR 89 bpm     Max Ex. BP 136/74     2 Minute Post BP 124/74           Interval HR   1 Minute HR 85     2 Minute HR 88     3 Minute HR 88     4 Minute HR 89     5 Minute HR --  Stopped test     6 Minute HR --  Stopped test     2 Minute Post HR 74     Interval Heart Rate? Yes           Interval Oxygen   Interval Oxygen? Yes     Baseline Oxygen Saturation % 98 %     1 Minute Oxygen Saturation % 90 %     1 Minute Liters of Oxygen 2 L     2 Minute Oxygen Saturation % 84 %     2 Minute Liters of Oxygen 2 L     3 Minute Oxygen Saturation % 81 %     3 Minute Liters of Oxygen 2 L     4 Minute Oxygen Saturation % 80 %     4 Minute Liters of Oxygen 2 L     5 Minute Oxygen Saturation %  --  Stopped test     6 Minute Oxygen Saturation % --  Stopped test     2 Minute Post Oxygen Saturation % 94 %     2 Minute Post Liters of Oxygen 2 L           Oxygen Initial Assessment:  Oxygen Initial Assessment - 09/03/20 1717      Home Oxygen   Home Oxygen Device E-Tanks;Portable Concentrator;Home Concentrator    Sleep Oxygen Prescription Continuous    Liters per minute 2    Home Exercise Oxygen Prescription Continuous    Liters per minute 2    Home Resting Oxygen Prescription Continuous    Liters per minute 2    Compliance with Home Oxygen Use Yes      Initial 6 min Walk   Oxygen Used Continuous    Liters per minute 2      Program Oxygen Prescription   Program Oxygen Prescription Continuous    Liters per minute 2      Intervention   Short Term Goals To learn and understand importance of monitoring SPO2 with pulse oximeter and demonstrate accurate use of the pulse oximeter.;To learn and demonstrate proper pursed lip breathing techniques or other breathing techniques.;To learn and understand importance of maintaining oxygen saturations>88%;To learn and demonstrate proper use of respiratory medications    Long  Term Goals Verbalizes importance of monitoring SPO2 with pulse oximeter and return demonstration;Maintenance of O2 saturations>88%;Exhibits proper breathing techniques, such as pursed lip breathing or other method taught during program session;Demonstrates proper use of  MDI's;Compliance with respiratory medication           Oxygen Re-Evaluation:   Oxygen Discharge (Final Oxygen Re-Evaluation):   Initial Exercise Prescription:  Initial Exercise Prescription - 09/03/20 1700      Date of Initial Exercise RX and Referring Provider   Date 09/03/20    Referring Provider Vernard Gambles MD      Oxygen   Oxygen Continuous    Liters 2      Treadmill   MPH 1.3    Grade 0    Minutes 15    METs 2      Recumbant Bike   Level 1    RPM 60    Watts 15     Minutes 15    METs 1.9      NuStep   Level 1    SPM 80    Minutes 15    METs 1.9      Prescription Details   Frequency (times per week) 3    Duration Progress to 30 minutes of continuous aerobic without signs/symptoms of physical distress      Intensity   THRR 40-80% of Max Heartrate 106-136    Ratings of Perceived Exertion 11-13    Perceived Dyspnea 0-4      Progression   Progression Continue to progress workloads to maintain intensity without signs/symptoms of physical distress.      Resistance Training   Training Prescription Yes    Weight Other   Using wrist weights due to severe arthritis   Reps 10-15           Perform Capillary Blood Glucose checks as needed.  Exercise Prescription Changes:   Exercise Prescription Changes    Row Name 09/03/20 1700             Response to Exercise   Blood Pressure (Admit) 126/72       Blood Pressure (Exercise) 136/74       Blood Pressure (Exit) 124/74       Heart Rate (Admit) 77 bpm       Heart Rate (Exercise) 89 bpm       Heart Rate (Exit) 74 bpm       Oxygen Saturation (Admit) 98 %       Oxygen Saturation (Exercise) 80 %       Oxygen Saturation (Exit) 96 %       Rating of Perceived Exertion (Exercise) 13       Perceived Dyspnea (Exercise) 1       Symptoms none       Comments walk test results              Exercise Comments:   Exercise Goals and Review:   Exercise Goals    Row Name 09/03/20 1714             Exercise Goals   Increase Physical Activity Yes       Intervention Provide advice, education, support and counseling about physical activity/exercise needs.;Develop an individualized exercise prescription for aerobic and resistive training based on initial evaluation findings, risk stratification, comorbidities and participant's personal goals.       Expected Outcomes Short Term: Attend rehab on a regular basis to increase amount of physical activity.;Long Term: Add in home exercise to make exercise  part of routine and to increase amount of physical activity.;Long Term: Exercising regularly at least 3-5 days a week.       Increase Strength and Stamina Yes  Intervention Provide advice, education, support and counseling about physical activity/exercise needs.;Develop an individualized exercise prescription for aerobic and resistive training based on initial evaluation findings, risk stratification, comorbidities and participant's personal goals.       Expected Outcomes Short Term: Increase workloads from initial exercise prescription for resistance, speed, and METs.;Short Term: Perform resistance training exercises routinely during rehab and add in resistance training at home;Long Term: Improve cardiorespiratory fitness, muscular endurance and strength as measured by increased METs and functional capacity (6MWT)       Able to understand and use rate of perceived exertion (RPE) scale Yes       Intervention Provide education and explanation on how to use RPE scale       Expected Outcomes Short Term: Able to use RPE daily in rehab to express subjective intensity level;Long Term:  Able to use RPE to guide intensity level when exercising independently       Able to understand and use Dyspnea scale Yes       Intervention Provide education and explanation on how to use Dyspnea scale       Expected Outcomes Short Term: Able to use Dyspnea scale daily in rehab to express subjective sense of shortness of breath during exertion;Long Term: Able to use Dyspnea scale to guide intensity level when exercising independently       Knowledge and understanding of Target Heart Rate Range (THRR) Yes       Intervention Provide education and explanation of THRR including how the numbers were predicted and where they are located for reference       Expected Outcomes Short Term: Able to state/look up THRR;Short Term: Able to use daily as guideline for intensity in rehab;Long Term: Able to use THRR to govern intensity when  exercising independently       Able to check pulse independently Yes       Intervention Provide education and demonstration on how to check pulse in carotid and radial arteries.;Review the importance of being able to check your own pulse for safety during independent exercise       Expected Outcomes Short Term: Able to explain why pulse checking is important during independent exercise;Long Term: Able to check pulse independently and accurately       Understanding of Exercise Prescription Yes       Intervention Provide education, explanation, and written materials on patient's individual exercise prescription       Expected Outcomes Short Term: Able to explain program exercise prescription;Long Term: Able to explain home exercise prescription to exercise independently              Exercise Goals Re-Evaluation :   Discharge Exercise Prescription (Final Exercise Prescription Changes):  Exercise Prescription Changes - 09/03/20 1700      Response to Exercise   Blood Pressure (Admit) 126/72    Blood Pressure (Exercise) 136/74    Blood Pressure (Exit) 124/74    Heart Rate (Admit) 77 bpm    Heart Rate (Exercise) 89 bpm    Heart Rate (Exit) 74 bpm    Oxygen Saturation (Admit) 98 %    Oxygen Saturation (Exercise) 80 %    Oxygen Saturation (Exit) 96 %    Rating of Perceived Exertion (Exercise) 13    Perceived Dyspnea (Exercise) 1    Symptoms none    Comments walk test results           Nutrition:  Target Goals: Understanding of nutrition guidelines, daily intake of sodium <1572m, cholesterol <  230m, calories 30% from fat and 7% or less from saturated fats, daily to have 5 or more servings of fruits and vegetables.  Education: All About Nutrition: -Group instruction provided by verbal, written material, interactive activities, discussions, models, and posters to present general guidelines for heart healthy nutrition including fat, fiber, MyPlate, the role of sodium in heart healthy  nutrition, utilization of the nutrition label, and utilization of this knowledge for meal planning. Follow up email sent as well. Written material given at graduation.   Biometrics:  Pre Biometrics - 09/03/20 1701      Pre Biometrics   Height 5' 5.75" (1.67 m)    Weight 100 lb 8 oz (45.6 kg)    BMI (Calculated) 16.35            Nutrition Therapy Plan and Nutrition Goals:   Nutrition Assessments:  MEDIFICTS Score Key:  ?70 Need to make dietary changes   40-70 Heart Healthy Diet  ? 40 Therapeutic Level Cholesterol Diet  [facility]  Picture Your Plate Total Score on Admission 63     Picture Your Plate Scores:  <<16Unhealthy dietary pattern with much room for improvement.  41-50 Dietary pattern unlikely to meet recommendations for good health and room for improvement.  51-60 More healthful dietary pattern, with some room for improvement.   >60 Healthy dietary pattern, although there may be some specific behaviors that could be improved.   Nutrition Goals Re-Evaluation:   Nutrition Goals Discharge (Final Nutrition Goals Re-Evaluation):   Psychosocial: Target Goals: Acknowledge presence or absence of significant depression and/or stress, maximize coping skills, provide positive support system. Participant is able to verbalize types and ability to use techniques and skills needed for reducing stress and depression.   Education: Stress, Anxiety, and Depression - Group verbal and visual presentation to define topics covered.  Reviews how body is impacted by stress, anxiety, and depression.  Also discusses healthy ways to reduce stress and to treat/manage anxiety and depression.  Written material given at graduation.   Education: Sleep Hygiene -Provides group verbal and written instruction about how sleep can affect your health.  Define sleep hygiene, discuss sleep cycles and impact of sleep habits.  Review good sleep hygiene tips.    Initial Review & Psychosocial Screening:  Initial Psych Review & Screening - 08/31/20 1536      Initial Review   Current issues with Current Stress Concerns;Current Sleep Concerns    Source of Stress Concerns Unable to participate in former interests or hobbies;Unable to perform yard/household activities;Chronic Illness      Family Dynamics   Good Support System? Yes   family     Barriers   Psychosocial barriers to participate in program There are no identifiable barriers or psychosocial needs.;The patient should benefit from training in stress management and relaxation.      Screening Interventions   Interventions Encouraged to exercise;Provide feedback about the scores to participant;To provide support and resources with identified psychosocial needs    Expected Outcomes Short Term goal: Utilizing psychosocial counselor, staff and physician to assist with identification of specific Stressors or current issues interfering with healing process. Setting desired goal for each stressor or current issue identified.;Long Term Goal: Stressors or current issues are controlled or eliminated.;Short Term goal: Identification and review with participant of any Quality of Life or Depression concerns found by scoring the questionnaire.;Long Term goal: The participant improves quality of Life and PHQ9 Scores as seen  by post scores and/or verbalization of changes           Quality of Life Scores:  Scores of 19 and below usually indicate a poorer quality of life in these areas.  A difference of  2-3 points is a clinically meaningful difference.  A difference of 2-3 points in the total score of the Quality of Life Index has been associated with significant improvement in overall quality of life, self-image, physical symptoms, and general health in studies assessing change in quality of life.  PHQ-9: Recent Review Flowsheet Data    Depression screen [facility] 2/9 09/03/2020  01/03/2020 08/14/2018   Decreased Interest 0 2 0   Down, Depressed, Hopeless 3  1 0   PHQ - 2 Score 3 3 0   Altered sleeping 2 2 0   Tired, decreased energy 3 2 0   Change in appetite 2  2 0   Feeling bad or failure about yourself  2 0 0   Trouble concentrating 1 0 0   Moving slowly or fidgety/restless 0 0 0   Suicidal thoughts 0 0 0   PHQ-9 Score 13 9 0   Difficult doing work/chores Somewhat difficult Very difficult Not difficult at all     Interpretation of Total Score  Total Score Depression Severity:  1-4 = Minimal depression, 5-9 = Mild depression, 10-14 = Moderate depression, 15-19 = Moderately severe depression, 20-27 = Severe depression   Psychosocial Evaluation and Intervention:  Psychosocial Evaluation - 08/31/20 1547      Psychosocial Evaluation & Interventions   Interventions Stress management education;Relaxation education;Encouraged to exercise with the program and follow exercise prescription    Comments Ms. Tonishia states she is very excited to start Pulmonary Rehab to help with her shortness of breath. She has also lost weight unintentionally and is looking forward to speaking with the dietician. She is currently working and expresses some stress related to that as well as stress concerning her chronic breathing issues. She does have a good support system that she cherishes. She has had to increase her oxygen use and is trialing different inhalers in hopes that will help with her constant feeling short of breath. She wants to impore her quality of life and gain some stamina.    Expected Outcomes Short: attend Pulmonary Rehab for education and exercise. Long: develop and maintain positive self care habits.    Continue Psychosocial Services  Follow up required by staff           Psychosocial Re-Evaluation:   Psychosocial Discharge (Final Psychosocial Re-Evaluation):   Education: Education Goals: Education classes will be provided on a weekly basis, covering required  topics. Participant will state understanding/return demonstration of topics presented.  Learning Barriers/Preferences:  Learning Barriers/Preferences - 08/31/20 1532      Learning Barriers/Preferences   Learning Barriers None    Learning Preferences None           General Pulmonary Education Topics:  Infection Prevention: - Provides verbal and written material to individual with discussion of infection control including proper hand washing and proper equipment cleaning during exercise session. Flowsheet Row Pulmonary Rehab from 09/03/2020 in [facility] Cardiac and Pulmonary Rehab  Education need identified 09/03/20  Date 09/03/20  Educator [place]  Instruction Review Code 1- Verbalizes Understanding      Falls Prevention: - Provides verbal and written material to individual with discussion of falls prevention and safety. Flowsheet Row Pulmonary Rehab from 09/03/2020 in [facility] Cardiac and Pulmonary Rehab  Education need  identified 09/03/20  Date 09/03/20  Educator [place]  Instruction Review Code 1- Verbalizes Understanding      Chronic Lung Disease Review: - Group verbal instruction with posters, models, PowerPoint presentations and videos,  to review new updates, new respiratory medications, new advancements in procedures and treatments. Providing information on websites and "800" numbers for continued self-education. Includes information about supplement oxygen, available portable oxygen systems, continuous and intermittent flow rates, oxygen safety, concentrators, and Medicare reimbursement for oxygen. Explanation of Pulmonary Drugs, including class, frequency, complications, importance of spacers, rinsing mouth after steroid MDI's, and proper cleaning methods for nebulizers. Review of basic lung anatomy and physiology related to function, structure, and complications of lung disease. Review of risk factors. Discussion about methods for diagnosing sleep apnea and types of masks and machines for OSA.  Includes a review of the use of types of environmental controls: home humidity, furnaces, filters, dust mite/pet prevention, HEPA vacuums. Discussion about weather changes, air quality and the benefits of nasal washing. Instruction on Warning signs, infection symptoms, calling MD promptly, preventive modes, and value of vaccinations. Review of effective airway clearance, coughing and/or vibration techniques. Emphasizing that all should Create an Action Plan. Written material given at graduation.   AED/CPR: - Group verbal and written instruction with the use of models to demonstrate the basic use of the AED with the basic ABC's of resuscitation.    Anatomy and Cardiac Procedures: - Group verbal and visual presentation and models provide information about basic cardiac anatomy and function. Reviews the testing methods done to diagnose heart disease and the outcomes of the test results. Describes the treatment choices: Medical Management, Angioplasty, or Coronary Bypass Surgery for treating various heart conditions including Myocardial Infarction, Angina, Valve Disease, and Cardiac Arrhythmias.  Written material given at graduation.   Medication Safety: - Group verbal and visual instruction to review commonly prescribed medications for heart and lung disease. Reviews the medication, class of the drug, and side effects. Includes the steps to properly store meds and maintain the prescription regimen.  Written material given at graduation.   Other: -Provides group and verbal instruction on various topics (see comments)   Knowledge Questionnaire Score:  Knowledge Questionnaire Score - 09/03/20 1715      Knowledge Questionnaire Score   Pre Score 16/18: Nutrition, medications            Core Components/Risk Factors/Patient Goals at Admission:  Personal Goals and Risk Factors at Admission - 09/03/20 1714      Core Components/Risk Factors/Patient Goals on Admission    Weight Management  Yes;Weight Gain    Intervention Weight Management: Provide education and appropriate resources to help participant work on and attain dietary goals.;Weight Management: Develop a combined nutrition and exercise program designed to reach desired caloric intake, while maintaining appropriate intake of nutrient and fiber, sodium and fats, and appropriate energy expenditure required for the weight goal.    Admit Weight 100 lb 8 oz (45.6 kg)    Goal Weight: Short Term 105 lb (47.6 kg)    Goal Weight: Long Term 110 lb (49.9 kg)    Expected Outcomes Short Term: Continue to assess and modify interventions until short term weight is achieved;Long Term: Adherence to nutrition and physical activity/exercise program aimed toward attainment of established weight goal;Weight Gain: Understanding of general recommendations for a high calorie, high protein meal plan that promotes weight gain by distributing calorie intake throughout the day with the consumption for 4-5 meals, snacks, and/or supplements;Understanding of distribution of calorie intake  throughout the day with the consumption of 4-5 meals/snacks;Understanding recommendations for meals to include 15-35% energy as protein, 25-35% energy from fat, 35-60% energy from carbohydrates, less than 226m of dietary cholesterol, 20-35 gm of total fiber daily    Improve shortness of breath with ADL's Yes    Intervention Provide education, individualized exercise plan and daily activity instruction to help decrease symptoms of SOB with activities of daily living.    Expected Outcomes Short Term: Improve cardiorespiratory fitness to achieve a reduction of symptoms when performing ADLs;Long Term: Be able to perform more ADLs without symptoms or delay the onset of symptoms    Hypertension Yes    Intervention Monitor prescription use compliance.;Provide education on lifestyle modifcations including regular physical activity/exercise, weight management, moderate sodium  restriction and increased consumption of fresh fruit, vegetables, and low fat dairy, alcohol moderation, and smoking cessation.    Expected Outcomes Short Term: Continued assessment and intervention until BP is < 140/927mHG in hypertensive participants. < 130/8047mG in hypertensive participants with diabetes, heart failure or chronic kidney disease.;Long Term: Maintenance of blood pressure at goal levels.          Advised patient to watch O2 saturations at home- keep a log and monitor.   Education:Diabetes - Individual verbal and written instruction to review signs/symptoms of diabetes, desired ranges of glucose level fasting, after meals and with exercise. Acknowledge that pre and post exercise glucose checks will be done for 3 sessions at entry of program.   Know Your Numbers and Heart Failure: - Group verbal and visual instruction to discuss disease risk factors for cardiac and pulmonary disease and treatment options.  Reviews associated critical values for Overweight/Obesity, Hypertension, Cholesterol, and Diabetes.  Discusses basics of heart failure: signs/symptoms and treatments.  Introduces Heart Failure Zone chart for action plan for heart failure.  Written material given at graduation.   Core Components/Risk Factors/Patient Goals Review:    Core Components/Risk Factors/Patient Goals at Discharge (Final Review):    ITP Comments:  ITP Comments    Row Name 08/31/20 1544 09/03/20 1701         ITP Comments Initial telephone orientation completed. Diagnosis can be found in [facility] on 1/13. EP orientation scheduled for Thursday 3/3 at 3pm. Completed 6MWT and gym orientation. Initial ITP created and sent for review to Dr. MarEmily Filbertedical Director.             Comments: Initial ITP

## 2020-09-03 NOTE — Patient Instructions (Signed)
Patient Instructions  Patient Details  Name: Carrie Hendricks MRN: [id number] Date of Birth: [birth year] Referring Provider:  Tyler Pita, MD  Below are your personal goals for exercise, nutrition, and risk factors. Our goal is to help you stay on track towards obtaining and maintaining these goals. We will be discussing your progress on these goals with you throughout the program.  Initial Exercise Prescription:  Initial Exercise Prescription - 09/03/20 1700      Date of Initial Exercise RX and Referring Provider   Date 09/03/20    Referring Provider Vernard Gambles MD      Oxygen   Oxygen Continuous    Liters 2      Treadmill   MPH 1.3    Grade 0    Minutes 15    METs 2      Recumbant Bike   Level 1    RPM 60    Watts 15    Minutes 15    METs 1.9      NuStep   Level 1    SPM 80    Minutes 15    METs 1.9      Prescription Details   Frequency (times per week) 3    Duration Progress to 30 minutes of continuous aerobic without signs/symptoms of physical distress      Intensity   THRR 40-80% of Max Heartrate 106-136    Ratings of Perceived Exertion 11-13    Perceived Dyspnea 0-4      Progression   Progression Continue to progress workloads to maintain intensity without signs/symptoms of physical distress.      Resistance Training   Training Prescription Yes    Weight Other   Using wrist weights due to severe arthritis   Reps 10-15           Exercise Goals: Frequency: Be able to perform aerobic exercise two to three times per week in program working toward 2-5 days per week of home exercise.  Intensity: Work with a perceived exertion of 11 (fairly light) - 15 (hard) while following your exercise prescription.  We will make changes to your prescription with you as you progress through the program.   Duration: Be able to do 30 to 45 minutes of continuous aerobic exercise in addition to a 5 minute warm-up and a 5 minute cool-down routine.   Nutrition  Goals: Your personal nutrition goals will be established when you do your nutrition analysis with the dietician.  The following are general nutrition guidelines to follow: Cholesterol < 210m/day Sodium < 15036mday Fiber: Women over 50 yrs - 21 grams per day  Personal Goals:  Personal Goals and Risk Factors at Admission - 09/03/20 1714      Core Components/Risk Factors/Patient Goals on Admission    Weight Management Yes;Weight Gain    Intervention Weight Management: Provide education and appropriate resources to help participant work on and attain dietary goals.;Weight Management: Develop a combined nutrition and exercise program designed to reach desired caloric intake, while maintaining appropriate intake of nutrient and fiber, sodium and fats, and appropriate energy expenditure required for the weight goal.    Admit Weight 100 lb 8 oz (45.6 kg)    Goal Weight: Short Term 105 lb (47.6 kg)    Goal Weight: Long Term 110 lb (49.9 kg)    Expected Outcomes Short Term: Continue to assess and modify interventions until short term weight is achieved;Long Term: Adherence to nutrition and physical activity/exercise program aimed toward attainment of  established weight goal;Weight Gain: Understanding of general recommendations for a high calorie, high protein meal plan that promotes weight gain by distributing calorie intake throughout the day with the consumption for 4-5 meals, snacks, and/or supplements;Understanding of distribution of calorie intake throughout the day with the consumption of 4-5 meals/snacks;Understanding recommendations for meals to include 15-35% energy as protein, 25-35% energy from fat, 35-60% energy from carbohydrates, less than 244m of dietary cholesterol, 20-35 gm of total fiber daily    Improve shortness of breath with ADL's Yes    Intervention Provide education, individualized exercise plan and daily activity instruction to help decrease symptoms of SOB with activities of  daily living.    Expected Outcomes Short Term: Improve cardiorespiratory fitness to achieve a reduction of symptoms when performing ADLs;Long Term: Be able to perform more ADLs without symptoms or delay the onset of symptoms    Hypertension Yes    Intervention Monitor prescription use compliance.;Provide education on lifestyle modifcations including regular physical activity/exercise, weight management, moderate sodium restriction and increased consumption of fresh fruit, vegetables, and low fat dairy, alcohol moderation, and smoking cessation.    Expected Outcomes Short Term: Continued assessment and intervention until BP is < 140/913mHG in hypertensive participants. < 130/8012mG in hypertensive participants with diabetes, heart failure or chronic kidney disease.;Long Term: Maintenance of blood pressure at goal levels.           Tobacco Use Initial Evaluation: Social History   Tobacco Use  Smoking Status Former Smoker  . Packs/day: 1.25  . Years: 30.00  . Pack years: 37.50  . Quit date: 2006  . Years since quitting: 16.1  Smokeless Tobacco Never Used  Tobacco Comment   Quit in 8/07    Exercise Goals and Review:  Exercise Goals    Row Name 09/03/20 1714             Exercise Goals   Increase Physical Activity Yes       Intervention Provide advice, education, support and counseling about physical activity/exercise needs.;Develop an individualized exercise prescription for aerobic and resistive training based on initial evaluation findings, risk stratification, comorbidities and participant's personal goals.       Expected Outcomes Short Term: Attend rehab on a regular basis to increase amount of physical activity.;Long Term: Add in home exercise to make exercise part of routine and to increase amount of physical activity.;Long Term: Exercising regularly at least 3-5 days a week.       Increase Strength and Stamina Yes       Intervention Provide advice, education, support and  counseling about physical activity/exercise needs.;Develop an individualized exercise prescription for aerobic and resistive training based on initial evaluation findings, risk stratification, comorbidities and participant's personal goals.       Expected Outcomes Short Term: Increase workloads from initial exercise prescription for resistance, speed, and METs.;Short Term: Perform resistance training exercises routinely during rehab and add in resistance training at home;Long Term: Improve cardiorespiratory fitness, muscular endurance and strength as measured by increased METs and functional capacity (6MWT)       Able to understand and use rate of perceived exertion (RPE) scale Yes       Intervention Provide education and explanation on how to use RPE scale       Expected Outcomes Short Term: Able to use RPE daily in rehab to express subjective intensity level;Long Term:  Able to use RPE to guide intensity level when exercising independently       Able to understand  and use Dyspnea scale Yes       Intervention Provide education and explanation on how to use Dyspnea scale       Expected Outcomes Short Term: Able to use Dyspnea scale daily in rehab to express subjective sense of shortness of breath during exertion;Long Term: Able to use Dyspnea scale to guide intensity level when exercising independently       Knowledge and understanding of Target Heart Rate Range (THRR) Yes       Intervention Provide education and explanation of THRR including how the numbers were predicted and where they are located for reference       Expected Outcomes Short Term: Able to state/look up THRR;Short Term: Able to use daily as guideline for intensity in rehab;Long Term: Able to use THRR to govern intensity when exercising independently       Able to check pulse independently Yes       Intervention Provide education and demonstration on how to check pulse in carotid and radial arteries.;Review the importance of being able to  check your own pulse for safety during independent exercise       Expected Outcomes Short Term: Able to explain why pulse checking is important during independent exercise;Long Term: Able to check pulse independently and accurately       Understanding of Exercise Prescription Yes       Intervention Provide education, explanation, and written materials on patient's individual exercise prescription       Expected Outcomes Short Term: Able to explain program exercise prescription;Long Term: Able to explain home exercise prescription to exercise independently              Copy of goals given to participant.

## 2020-09-05 DIAGNOSIS — J449 Chronic obstructive pulmonary disease, unspecified: Secondary | ICD-10-CM | POA: Diagnosis not present

## 2020-09-07 DIAGNOSIS — Z79899 Other long term (current) drug therapy: Secondary | ICD-10-CM | POA: Diagnosis not present

## 2020-09-07 DIAGNOSIS — Z7901 Long term (current) use of anticoagulants: Secondary | ICD-10-CM | POA: Diagnosis not present

## 2020-09-07 DIAGNOSIS — J449 Chronic obstructive pulmonary disease, unspecified: Secondary | ICD-10-CM

## 2020-09-07 DIAGNOSIS — Z87891 Personal history of nicotine dependence: Secondary | ICD-10-CM | POA: Diagnosis not present

## 2020-09-07 DIAGNOSIS — I272 Pulmonary hypertension, unspecified: Secondary | ICD-10-CM

## 2020-09-07 DIAGNOSIS — Z7982 Long term (current) use of aspirin: Secondary | ICD-10-CM | POA: Diagnosis not present

## 2020-09-07 NOTE — Progress Notes (Signed)
Daily Session Note  Patient Details  Name: Carrie Hendricks MRN: [id number] Date of Birth: 1951/04/07 Referring Provider:   Flowsheet Row Pulmonary Rehab from 09/03/2020 in [facility] Cardiac and Pulmonary Rehab  Referring Provider Vernard Gambles MD      Encounter Date: 09/07/2020  Check In:      Social History   Tobacco Use  Smoking Status Former Smoker  . Packs/day: 1.25  . Years: 30.00  . Pack years: 37.50  . Quit date: 2006  . Years since quitting: 16.1  Smokeless Tobacco Never Used  Tobacco Comment   Quit in 8/07    Goals Met:  Independence with exercise equipment Exercise tolerated well No report of cardiac concerns or symptoms Strength training completed today  Goals Unmet:  Not Applicable  Comments: First full day of exercise!  Patient was oriented to gym and equipment including functions, settings, policies, and procedures.  Patient's individual exercise prescription and treatment plan were reviewed.  All starting workloads were established based on the results of the 6 minute walk test done at initial orientation visit.  The plan for exercise progression was also introduced and progression will be customized based on patient's performance and goals.    Dr. Emily Filbert is Medical Director for [place] and [facility].

## 2020-09-08 ENCOUNTER — Encounter: Payer: Self-pay | Admitting: Family Medicine

## 2020-09-09 ENCOUNTER — Other Ambulatory Visit: Payer: Self-pay

## 2020-09-09 ENCOUNTER — Telehealth: Payer: Self-pay | Admitting: Pulmonary Disease

## 2020-09-09 DIAGNOSIS — Z7901 Long term (current) use of anticoagulants: Secondary | ICD-10-CM | POA: Diagnosis not present

## 2020-09-09 DIAGNOSIS — Z7982 Long term (current) use of aspirin: Secondary | ICD-10-CM | POA: Diagnosis not present

## 2020-09-09 DIAGNOSIS — J449 Chronic obstructive pulmonary disease, unspecified: Secondary | ICD-10-CM

## 2020-09-09 DIAGNOSIS — I272 Pulmonary hypertension, unspecified: Secondary | ICD-10-CM | POA: Diagnosis not present

## 2020-09-09 DIAGNOSIS — Z79899 Other long term (current) drug therapy: Secondary | ICD-10-CM | POA: Diagnosis not present

## 2020-09-09 DIAGNOSIS — Z87891 Personal history of nicotine dependence: Secondary | ICD-10-CM | POA: Diagnosis not present

## 2020-09-09 NOTE — Progress Notes (Signed)
Daily Session Note  Patient Details  Name: AZALIAH CARRERO MRN: [id number] Date of Birth: 1950-11-06 Referring Provider:   Flowsheet Row Pulmonary Rehab from 09/03/2020 in [facility] Cardiac and Pulmonary Rehab  Referring Provider Vernard Gambles MD      Encounter Date: 09/09/2020  Check In:  Session Check In - 09/09/20 1640      Check-In   Supervising physician immediately available to respond to emergencies See telemetry face sheet for immediately available ER MD    Location [facility]-Cardiac & Pulmonary Rehab    Staff Present Birdie Sons, MPA, RN;Joseph Lou Miner, MS Exercise Physiologist    Virtual Visit No    Medication changes reported     No    Fall or balance concerns reported    No    Warm-up and Cool-down Performed on first and last piece of equipment    Resistance Training Performed Yes    VAD Patient? No    PAD/SET Patient? No      Pain Assessment   Currently in Pain? No/denies              Social History   Tobacco Use  Smoking Status Former Smoker  . Packs/day: 1.25  . Years: 30.00  . Pack years: 37.50  . Quit date: 2006  . Years since quitting: 16.1  Smokeless Tobacco Never Used  Tobacco Comment   Quit in 8/07    Goals Met:  Independence with exercise equipment Exercise tolerated well No report of cardiac concerns or symptoms Strength training completed today  Goals Unmet:  Not Applicable  Comments: Pt able to follow exercise prescription today without complaint.  Will continue to monitor for progression.    Dr. Emily Filbert is Medical Director for [place] and [facility].

## 2020-09-09 NOTE — Telephone Encounter (Signed)
Lm to offer OV for 10/15/2020 at 4:00.

## 2020-09-09 NOTE — Telephone Encounter (Signed)
Pt called back. R/s pt for 10/15/20 at 4:00 w/ CG. Nothing further needed.

## 2020-09-10 ENCOUNTER — Encounter: Payer: Medicare HMO | Admitting: *Deleted

## 2020-09-10 DIAGNOSIS — I272 Pulmonary hypertension, unspecified: Secondary | ICD-10-CM

## 2020-09-10 DIAGNOSIS — Z87891 Personal history of nicotine dependence: Secondary | ICD-10-CM | POA: Diagnosis not present

## 2020-09-10 DIAGNOSIS — Z79899 Other long term (current) drug therapy: Secondary | ICD-10-CM | POA: Diagnosis not present

## 2020-09-10 DIAGNOSIS — Z7901 Long term (current) use of anticoagulants: Secondary | ICD-10-CM | POA: Diagnosis not present

## 2020-09-10 DIAGNOSIS — J449 Chronic obstructive pulmonary disease, unspecified: Secondary | ICD-10-CM

## 2020-09-10 DIAGNOSIS — Z7982 Long term (current) use of aspirin: Secondary | ICD-10-CM | POA: Diagnosis not present

## 2020-09-10 NOTE — Progress Notes (Signed)
Daily Session Note  Patient Details  Name: Carrie Hendricks MRN: [id number] Date of Birth: 1951-05-16 Referring Provider:   Flowsheet Row Pulmonary Rehab from 09/03/2020 in [facility] Cardiac and Pulmonary Rehab  Referring Provider Vernard Gambles MD      Encounter Date: 09/10/2020  Check In:  Session Check In - 09/10/20 1714      Check-In   Supervising physician immediately available to respond to emergencies See telemetry face sheet for immediately available ER MD    Location [facility]-Cardiac & Pulmonary Rehab    Staff Present Renita Papa, RN BSN;Joseph Hood RCP,RRT,BSRT;Amanda Oletta Darter, [place], ACSM CEP, Exercise Physiologist    Virtual Visit No    Medication changes reported     No    Fall or balance concerns reported    No    Warm-up and Cool-down Performed on first and last piece of equipment    Resistance Training Performed Yes    VAD Patient? No    PAD/SET Patient? No      Pain Assessment   Currently in Pain? No/denies              Social History   Tobacco Use  Smoking Status Former Smoker  . Packs/day: 1.25  . Years: 30.00  . Pack years: 37.50  . Quit date: 2006  . Years since quitting: 16.1  Smokeless Tobacco Never Used  Tobacco Comment   Quit in 8/07    Goals Met:  Independence with exercise equipment Exercise tolerated well No report of cardiac concerns or symptoms Strength training completed today  Goals Unmet:  Not Applicable  Comments: Pt able to follow exercise prescription today without complaint.  Will continue to monitor for progression.    Dr. Emily Filbert is Medical Director for [place] and [facility].

## 2020-09-12 DIAGNOSIS — J449 Chronic obstructive pulmonary disease, unspecified: Secondary | ICD-10-CM | POA: Diagnosis not present

## 2020-09-14 ENCOUNTER — Other Ambulatory Visit: Payer: Self-pay

## 2020-09-14 ENCOUNTER — Encounter: Payer: Medicare HMO | Admitting: *Deleted

## 2020-09-14 DIAGNOSIS — Z7901 Long term (current) use of anticoagulants: Secondary | ICD-10-CM | POA: Diagnosis not present

## 2020-09-14 DIAGNOSIS — I272 Pulmonary hypertension, unspecified: Secondary | ICD-10-CM | POA: Diagnosis not present

## 2020-09-14 DIAGNOSIS — J449 Chronic obstructive pulmonary disease, unspecified: Secondary | ICD-10-CM | POA: Diagnosis not present

## 2020-09-14 DIAGNOSIS — Z87891 Personal history of nicotine dependence: Secondary | ICD-10-CM | POA: Diagnosis not present

## 2020-09-14 DIAGNOSIS — Z79899 Other long term (current) drug therapy: Secondary | ICD-10-CM | POA: Diagnosis not present

## 2020-09-14 DIAGNOSIS — Z7982 Long term (current) use of aspirin: Secondary | ICD-10-CM | POA: Diagnosis not present

## 2020-09-14 NOTE — Progress Notes (Signed)
Daily Session Note  Patient Details  Name: Carrie Hendricks MRN: [id number] Date of Birth: 1950/12/05 Referring Provider:   Flowsheet Row Pulmonary Rehab from 09/03/2020 in [facility] Cardiac and Pulmonary Rehab  Referring Provider Vernard Gambles MD      Encounter Date: 09/14/2020  Check In:  Session Check In - 09/14/20 1728      Check-In   Supervising physician immediately available to respond to emergencies See telemetry face sheet for immediately available ER MD    Location [facility]-Cardiac & Pulmonary Rehab    Staff Present Renita Papa, RN Moises Blood, BS, ACSM CEP, Exercise Physiologist;Kara Eliezer Bottom, MS Exercise Physiologist    Virtual Visit No    Medication changes reported     No    Fall or balance concerns reported    No    Warm-up and Cool-down Performed on first and last piece of equipment    Resistance Training Performed Yes    VAD Patient? No    PAD/SET Patient? No      Pain Assessment   Currently in Pain? No/denies              Social History   Tobacco Use  Smoking Status Former Smoker  . Packs/day: 1.25  . Years: 30.00  . Pack years: 37.50  . Quit date: 2006  . Years since quitting: 16.2  Smokeless Tobacco Never Used  Tobacco Comment   Quit in 8/07    Goals Met:  Independence with exercise equipment Exercise tolerated well No report of cardiac concerns or symptoms Strength training completed today  Goals Unmet:  Not Applicable  Comments: Pt able to follow exercise prescription today without complaint.  Will continue to monitor for progression.    Dr. Emily Filbert is Medical Director for [place] and [facility].

## 2020-09-16 ENCOUNTER — Other Ambulatory Visit: Payer: Self-pay

## 2020-09-16 DIAGNOSIS — I272 Pulmonary hypertension, unspecified: Secondary | ICD-10-CM | POA: Diagnosis not present

## 2020-09-16 DIAGNOSIS — Z79899 Other long term (current) drug therapy: Secondary | ICD-10-CM | POA: Diagnosis not present

## 2020-09-16 DIAGNOSIS — Z87891 Personal history of nicotine dependence: Secondary | ICD-10-CM | POA: Diagnosis not present

## 2020-09-16 DIAGNOSIS — Z7982 Long term (current) use of aspirin: Secondary | ICD-10-CM | POA: Diagnosis not present

## 2020-09-16 DIAGNOSIS — Z7901 Long term (current) use of anticoagulants: Secondary | ICD-10-CM | POA: Diagnosis not present

## 2020-09-16 NOTE — Progress Notes (Signed)
Daily Session Note  Patient Details  Name: Carrie Hendricks MRN: [id number] Date of Birth: July 14, 1950 Referring Provider:   Flowsheet Row Pulmonary Rehab from 09/03/2020 in [facility] Card[facility] and Pulmonary Rehab  Referring Provider Vernard Gambles MD      Encounter Date: 09/16/2020  Check In:  Session Check In - 09/16/20 1646      Check-In   Supervising physician immediately available to respond to emergencies See telemetry face sheet for immediately available ER MD    Location [facility]-Cardiac & Pulmonary Rehab    Staff Present Birdie Sons, MPA, Nino Glow, MS Exercise Physiologist;Joseph Flavia Shipper    Virtual Visit No    Medication changes reported     No    Fall or balance concerns reported    No    Warm-up and Cool-down Performed on first and last piece of equipment    Resistance Training Performed Yes    VAD Patient? No    PAD/SET Patient? No      Pain Assessment   Currently in Pain? No/denies              Social History   Tobacco Use  Smoking Status Former Smoker  . Packs/day: 1.25  . Years: 30.00  . Pack years: 37.50  . Quit date: 2006  . Years since quitting: 16.2  Smokeless Tobacco Never Used  Tobacco Comment   Quit in 8/07    Goals Met:  Independence with exercise equipment Exercise tolerated well No report of cardiac concerns or symptoms Strength training completed today  Goals Unmet:  Not Applicable  Comments: Pt able to follow exercise prescription today without complaint.  Will continue to monitor for progression.    Dr. Emily Filbert is Medical Director for [place] and [facility].

## 2020-09-17 ENCOUNTER — Encounter: Payer: Medicare HMO | Admitting: *Deleted

## 2020-09-17 DIAGNOSIS — Z7901 Long term (current) use of anticoagulants: Secondary | ICD-10-CM | POA: Diagnosis not present

## 2020-09-17 DIAGNOSIS — Z87891 Personal history of nicotine dependence: Secondary | ICD-10-CM | POA: Diagnosis not present

## 2020-09-17 DIAGNOSIS — I272 Pulmonary hypertension, unspecified: Secondary | ICD-10-CM | POA: Diagnosis not present

## 2020-09-17 DIAGNOSIS — J449 Chronic obstructive pulmonary disease, unspecified: Secondary | ICD-10-CM

## 2020-09-17 DIAGNOSIS — Z79899 Other long term (current) drug therapy: Secondary | ICD-10-CM | POA: Diagnosis not present

## 2020-09-17 DIAGNOSIS — Z7982 Long term (current) use of aspirin: Secondary | ICD-10-CM | POA: Diagnosis not present

## 2020-09-17 NOTE — Progress Notes (Signed)
Daily Session Note  Patient Details  Name: Carrie Hendricks MRN: [id number] Date of Birth: March 15, 1951 Referring Provider:   Flowsheet Row Pulmonary Rehab from 09/03/2020 in [facility] Card[facility] and Pulmonary Rehab  Referring Provider Vernard Gambles MD      Encounter Date: 09/17/2020  Check In:  Session Check In - 09/17/20 1715      Check-In   Supervising physician immediately available to respond to emergencies See telemetry face sheet for immediately available ER MD    Location [facility]-Cardiac & Pulmonary Rehab    Staff Present Renita Papa, RN BSN;Joseph Tessie Fass RCP,RRT,BSRT    Virtual Visit No    Medication changes reported     No    Fall or balance concerns reported    No    Warm-up and Cool-down Performed on first and last piece of equipment    Resistance Training Performed Yes    VAD Patient? No    PAD/SET Patient? No      Pain Assessment   Currently in Pain? No/denies              Social History   Tobacco Use  Smoking Status Former Smoker  . Packs/day: 1.25  . Years: 30.00  . Pack years: 37.50  . Quit date: 2006  . Years since quitting: 16.2  Smokeless Tobacco Never Used  Tobacco Comment   Quit in 8/07    Goals Met:  Independence with exercise equipment Exercise tolerated well No report of cardiac concerns or symptoms Strength training completed today  Goals Unmet:  Not Applicable  Comments: Pt able to follow exercise prescription today without complaint.  Will continue to monitor for progression.    Dr. Emily Filbert is Medical Director for [place] and [facility].

## 2020-09-18 NOTE — Telephone Encounter (Signed)
Inform   She is very much still my patient, unsure why the name disappeared from the chart. I will see her in 6-8 weeks in the clinic with CBC,CMP,CRP,Vitamin D levels checked   Dr Vicente Males

## 2020-09-21 ENCOUNTER — Other Ambulatory Visit: Payer: Self-pay

## 2020-09-21 ENCOUNTER — Encounter: Payer: Medicare HMO | Admitting: *Deleted

## 2020-09-21 DIAGNOSIS — Z87891 Personal history of nicotine dependence: Secondary | ICD-10-CM | POA: Diagnosis not present

## 2020-09-21 DIAGNOSIS — Z79899 Other long term (current) drug therapy: Secondary | ICD-10-CM | POA: Diagnosis not present

## 2020-09-21 DIAGNOSIS — J449 Chronic obstructive pulmonary disease, unspecified: Secondary | ICD-10-CM

## 2020-09-21 DIAGNOSIS — I272 Pulmonary hypertension, unspecified: Secondary | ICD-10-CM | POA: Diagnosis not present

## 2020-09-21 DIAGNOSIS — Z7982 Long term (current) use of aspirin: Secondary | ICD-10-CM | POA: Diagnosis not present

## 2020-09-21 DIAGNOSIS — Z7901 Long term (current) use of anticoagulants: Secondary | ICD-10-CM | POA: Diagnosis not present

## 2020-09-21 NOTE — Progress Notes (Signed)
Daily Session Note  Patient Details  Name: TYESHIA CORNFORTH MRN: [id number] Date of Birth: 1950-11-08 Referring Provider:   Flowsheet Row Pulmonary Rehab from 09/03/2020 in [facility]  Referring Provider Vernard Gambles MD      Encounter Date: 09/21/2020  Check In:  Session Check In - 09/21/20 1703      Check-In   Supervising physician immediately available to respond to emergencies See telemetry face sheet for immediately available ER MD    Location [facility]-Cardiac & Pulmonary Rehab    Staff Present Renita Papa, RN Moises Blood, BS, ACSM CEP, Exercise Physiologist;Kara Eliezer Bottom, MS Exercise Physiologist    Virtual Visit No    Medication changes reported     No    Fall or balance concerns reported    No    Warm-up and Cool-down Performed on first and last piece of equipment    Resistance Training Performed Yes    VAD Patient? No    PAD/SET Patient? No      Pain Assessment   Currently in Pain? No/denies              Social History   Tobacco Use  Smoking Status Former Smoker  . Packs/day: 1.25  . Years: 30.00  . Pack years: 37.50  . Quit date: 2006  . Years since quitting: 16.2  Smokeless Tobacco Never Used  Tobacco Comment   Quit in 8/07    Goals Met:  Independence with exercise equipment Exercise tolerated well No report of cardiac concerns or symptoms Strength training completed today  Goals Unmet:  Not Applicable  Comments: Pt able to follow exercise prescription today without complaint.  Will continue to monitor for progression.    Dr. Emily Filbert is Medical Director for [place] and [facility].

## 2020-09-23 ENCOUNTER — Other Ambulatory Visit: Payer: Self-pay

## 2020-09-23 ENCOUNTER — Encounter: Payer: Self-pay | Admitting: *Deleted

## 2020-09-23 DIAGNOSIS — J449 Chronic obstructive pulmonary disease, unspecified: Secondary | ICD-10-CM

## 2020-09-23 DIAGNOSIS — K5190 Ulcerative colitis, unspecified, without complications: Secondary | ICD-10-CM

## 2020-09-23 DIAGNOSIS — Z7982 Long term (current) use of aspirin: Secondary | ICD-10-CM | POA: Diagnosis not present

## 2020-09-23 DIAGNOSIS — Z87891 Personal history of nicotine dependence: Secondary | ICD-10-CM | POA: Diagnosis not present

## 2020-09-23 DIAGNOSIS — Z79899 Other long term (current) drug therapy: Secondary | ICD-10-CM | POA: Diagnosis not present

## 2020-09-23 DIAGNOSIS — I272 Pulmonary hypertension, unspecified: Secondary | ICD-10-CM | POA: Diagnosis not present

## 2020-09-23 DIAGNOSIS — Z7901 Long term (current) use of anticoagulants: Secondary | ICD-10-CM | POA: Diagnosis not present

## 2020-09-23 NOTE — Progress Notes (Signed)
Pulmonary Individual Treatment Plan  Patient Details  Name: Carrie Hendricks MRN: [id number] Date of Birth: 1950/07/25 Referring Provider:   Flowsheet Row Pulmonary Rehab from 09/03/2020 in [facility] Cardiac and Pulmonary Rehab  Referring Provider Vernard Gambles MD      Initial Encounter Date:  Flowsheet Row Pulmonary Rehab from 09/03/2020 in [facility] Cardiac and Pulmonary Rehab  Date 09/03/20      Visit Diagnosis: Pulmonary hypertension ([place])  Patient's Home Medications on Admission:  Current Outpatient Medications:  .  albuterol (VENTOLIN HFA) 108 (90 Base) MCG/ACT inhaler, Inhale 2 puffs into the lungs every 6 (six) hours as needed for wheezing or shortness of breath., Disp: 54 g, Rfl: 1 .  alendronate (FOSAMAX) 70 MG tablet, Take 1 tablet by mouth once a week., Disp: , Rfl:  .  amLODipine (NORVASC) 5 MG tablet, TAKE 1 TABLET EVERY DAY, Disp: 90 tablet, Rfl: 0 .  Aspirin 81 MG EC tablet, Take 81 mg by mouth daily., Disp: , Rfl:  .  Budeson-Glycopyrrol-Formoterol (BREZTRI AEROSPHERE) 160-9-4.8 MCG/ACT AERO, Inhale into the lungs., Disp: , Rfl:  .  etanercept (ENBREL) 50 MG/ML injection, Inject 50 mg into the skin once a week., Disp: , Rfl:  .  hydrochlorothiazide (HYDRODIURIL) 25 MG tablet, TAKE 1 TABLET EVERY DAY, Disp: 90 tablet, Rfl: 0 .  hydroxychloroquine (PLAQUENIL) 200 MG tablet, Take 200 mg by mouth daily., Disp: , Rfl:  .  mesalamine (LIALDA) 1.2 g EC tablet, TAKE 1 TABLET EVERY DAY WITH BREAKFAST, Disp: 90 tablet, Rfl: 3 .  Multiple Vitamin (MULTIVITAMIN) capsule, Take 1 capsule by mouth daily., Disp: , Rfl:   Past Medical History: Past Medical History:  Diagnosis Date  . Arthritis    rheumatoid  . Complication of anesthesia   . Hypertension   . Osteoporosis   . PONV (postoperative nausea and vomiting)   . Pulmonary hypertension ([place])   . Rheumatoid arthritis(714.0)    ? scleroderma    Tobacco Use: Social History   Tobacco Use  Smoking Status Former Smoker  .  Packs/day: 1.25  . Years: 30.00  . Pack years: 37.50  . Quit date: 2006  . Years since quitting: 16.2  Smokeless Tobacco Never Used  Tobacco Comment   Quit in 8/07    Labs: Recent Review Flowsheet Data    Labs for ITP Cardiac and Pulmonary Rehab Latest Ref Rng & Units 10/15/2013 03/05/2014 05/15/2015 05/25/2016 08/14/2018   Cholestrol 0 - 200 mg/dL 178 195 177 197 186   LDLCALC 0 - 99 mg/dL 95 113(H) 93 118(H) 105(H)   HDL >39.00 mg/dL 65.50 69.80 70 64.30 63.30   Trlycerides 0.0 - 149.0 mg/dL 86.0 63.0 69 76.0 91.0       Pulmonary Assessment Scores:  Pulmonary Assessment Scores    Row Name 09/03/20 1718         ADL UCSD   ADL Phase Entry     SOB Score total 54     Rest 1     Walk 3     Stairs 5     Bath 2     Dress 3     Shop 3           CAT Score   CAT Score 27           mMRC Score   mMRC Score 2            UCSD: Self-administered rating of dyspnea associated with activities of daily living (ADLs) 6-point scale (0 = "not  at all" to 5 = "maximal or unable to do because of breathlessness")  Scoring Scores range from 0 to 120.  Minimally important difference is 5 units  CAT: CAT can identify the health impairment of COPD patients and is better correlated with disease progression.  CAT has a scoring range of zero to 40. The CAT score is classified into four groups of low (less than 10), medium (10 - 20), high (21-30) and very high (31-40) based on the impact level of disease on health status. A CAT score over 10 suggests significant symptoms.  A worsening CAT score could be explained by an exacerbation, poor medication adherence, poor inhaler technique, or progression of COPD or comorbid conditions.  CAT MCID is 2 points  mMRC: mMRC (Modified Medical Research Council) Dyspnea Scale is used to assess the degree of baseline functional disability in patients of respiratory disease due to dyspnea. No minimal important difference is established. A decrease in score of  1 point or greater is considered a positive change.   Pulmonary Function Assessment:   Exercise Target Goals: Exercise Program Goal: Individual exercise prescription set using results from initial 6 min walk test and THRR while considering  patient's activity barriers and safety.   Exercise Prescription Goal: Initial exercise prescription builds to 30-45 minutes a day of aerobic activity, 2-3 days per week.  Home exercise guidelines will be given to patient during program as part of exercise prescription that the participant will acknowledge.  Education: Aerobic Exercise: - Group verbal and visual presentation on the components of exercise prescription. Introduces F.I.T.T principle from ACSM for exercise prescriptions.  Reviews F.I.T.T. principles of aerobic exercise including progression. Written material given at graduation.   Education: Resistance Exercise: - Group verbal and visual presentation on the components of exercise prescription. Introduces F.I.T.T principle from ACSM for exercise prescriptions  Reviews F.I.T.T. principles of resistance exercise including progression. Written material given at graduation.    Education: Exercise & Equipment Safety: - Individual verbal instruction and demonstration of equipment use and safety with use of the equipment. Flowsheet Row Pulmonary Rehab from 09/16/2020 in [facility] Cardiac and Pulmonary Rehab  Education need identified 09/03/20  Date 09/03/20  Educator [place]  Instruction Review Code 1- Verbalizes Understanding      Education: Exercise Physiology & General Exercise Guidelines: - Group verbal and written instruction with models to review the exercise physiology of the cardiovascular system and associated critical values. Provides general exercise guidelines with specific guidelines to those with heart or lung disease.    Education: Flexibility, Balance, Mind/Body Relaxation: - Group verbal and visual presentation with interactive  activity on the components of exercise prescription. Introduces F.I.T.T principle from ACSM for exercise prescriptions. Reviews F.I.T.T. principles of flexibility and balance exercise training including progression. Also discusses the mind body connection.  Reviews various relaxation techniques to help reduce and manage stress (i.e. Deep breathing, progressive muscle relaxation, and visualization). Balance handout provided to take home. Written material given at graduation.   Activity Barriers & Risk Stratification:  Activity Barriers & Cardiac Risk Stratification - 09/03/20 1705      Activity Barriers & Cardiac Risk Stratification   Activity Barriers Arthritis;Shortness of Breath;Back Problems;Deconditioning;Other (comment)    Comments Sciatica, DDD           6 Minute Walk:  6 Minute Walk    Row Name 09/03/20 1711         6 Minute Walk   Phase Initial     Distance 400 feet  Walk Time 3 minutes     # of Rest Breaks 0     MPH 1.51     METS 1.91     RPE 13     Perceived Dyspnea  1     VO2 Peak 6.69     Symptoms No     Resting HR 77 bpm     Resting BP 126/72     Resting Oxygen Saturation  98 %     Exercise Oxygen Saturation  during 6 min walk 80 %  Stopped test     Max Ex. HR 89 bpm     Max Ex. BP 136/74     2 Minute Post BP 124/74           Interval HR   1 Minute HR 85     2 Minute HR 88     3 Minute HR 88     4 Minute HR 89     5 Minute HR --  Stopped test     6 Minute HR --  Stopped test     2 Minute Post HR 74     Interval Heart Rate? Yes           Interval Oxygen   Interval Oxygen? Yes     Baseline Oxygen Saturation % 98 %     1 Minute Oxygen Saturation % 90 %     1 Minute Liters of Oxygen 2 L     2 Minute Oxygen Saturation % 84 %     2 Minute Liters of Oxygen 2 L     3 Minute Oxygen Saturation % 81 %     3 Minute Liters of Oxygen 2 L     4 Minute Oxygen Saturation % 80 %     4 Minute Liters of Oxygen 2 L     5 Minute Oxygen Saturation % --  Stopped  test     6 Minute Oxygen Saturation % --  Stopped test     2 Minute Post Oxygen Saturation % 94 %     2 Minute Post Liters of Oxygen 2 L           Oxygen Initial Assessment:  Oxygen Initial Assessment - 09/03/20 1717      Home Oxygen   Home Oxygen Device E-Tanks;Portable Concentrator;Home Concentrator    Sleep Oxygen Prescription Continuous    Liters per minute 2    Home Exercise Oxygen Prescription Continuous    Liters per minute 2    Home Resting Oxygen Prescription Continuous    Liters per minute 2    Compliance with Home Oxygen Use Yes      Initial 6 min Walk   Oxygen Used Continuous    Liters per minute 2      Program Oxygen Prescription   Program Oxygen Prescription Continuous    Liters per minute 2      Intervention   Short Term Goals To learn and understand importance of monitoring SPO2 with pulse oximeter and demonstrate accurate use of the pulse oximeter.;To learn and demonstrate proper pursed lip breathing techniques or other breathing techniques.;To learn and understand importance of maintaining oxygen saturations>88%;To learn and demonstrate proper use of respiratory medications    Long  Term Goals Verbalizes importance of monitoring SPO2 with pulse oximeter and return demonstration;Maintenance of O2 saturations>88%;Exhibits proper breathing techniques, such as pursed lip breathing or other method taught during program session;Demonstrates proper use of MDI's;Compliance with respiratory medication  Oxygen Re-Evaluation:  Oxygen Re-Evaluation    Row Name 09/07/20 1701             Program Oxygen Prescription   Program Oxygen Prescription Continuous       Liters per minute 2               Home Oxygen   Home Oxygen Device E-Tanks;Portable Concentrator;Home Concentrator       Sleep Oxygen Prescription Continuous       Liters per minute 2       Home Exercise Oxygen Prescription Continuous       Liters per minute 2       Home Resting Oxygen  Prescription Continuous       Liters per minute 2       Compliance with Home Oxygen Use Yes               Goals/Expected Outcomes   Short Term Goals To learn and understand importance of monitoring SPO2 with pulse oximeter and demonstrate accurate use of the pulse oximeter.;To learn and demonstrate proper pursed lip breathing techniques or other breathing techniques.;To learn and understand importance of maintaining oxygen saturations>88%       Long  Term Goals Verbalizes importance of monitoring SPO2 with pulse oximeter and return demonstration;Maintenance of O2 saturations>88%;Exhibits proper breathing techniques, such as pursed lip breathing or other method taught during program session;Exhibits compliance with exercise, home and travel O2 prescription       Comments Reviewed PLB technique with pt.  Talked about how it works and it's importance in maintaining their exercise saturations.       Goals/Expected Outcomes Short: Become more profiecient at using PLB.   Long: Become independent at using PLB.              Oxygen Discharge (Final Oxygen Re-Evaluation):  Oxygen Re-Evaluation - 09/07/20 1701      Program Oxygen Prescription   Program Oxygen Prescription Continuous    Liters per minute 2      Home Oxygen   Home Oxygen Device E-Tanks;Portable Concentrator;Home Concentrator    Sleep Oxygen Prescription Continuous    Liters per minute 2    Home Exercise Oxygen Prescription Continuous    Liters per minute 2    Home Resting Oxygen Prescription Continuous    Liters per minute 2    Compliance with Home Oxygen Use Yes      Goals/Expected Outcomes   Short Term Goals To learn and understand importance of monitoring SPO2 with pulse oximeter and demonstrate accurate use of the pulse oximeter.;To learn and demonstrate proper pursed lip breathing techniques or other breathing techniques.;To learn and understand importance of maintaining oxygen saturations>88%    Long  Term Goals  Verbalizes importance of monitoring SPO2 with pulse oximeter and return demonstration;Maintenance of O2 saturations>88%;Exhibits proper breathing techniques, such as pursed lip breathing or other method taught during program session;Exhibits compliance with exercise, home and travel O2 prescription    Comments Reviewed PLB technique with pt.  Talked about how it works and it's importance in maintaining their exercise saturations.    Goals/Expected Outcomes Short: Become more profiecient at using PLB.   Long: Become independent at using PLB.           Initial Exercise Prescription:  Initial Exercise Prescription - 09/03/20 1700      Date of Initial Exercise RX and Referring Provider   Date 09/03/20    Referring Provider Vernard Gambles MD  Oxygen   Oxygen Continuous    Liters 2      Treadmill   MPH 1.3    Grade 0    Minutes 15    METs 2      Recumbant Bike   Level 1    RPM 60    Watts 15    Minutes 15    METs 1.9      NuStep   Level 1    SPM 80    Minutes 15    METs 1.9      Prescription Details   Frequency (times per week) 3    Duration Progress to 30 minutes of continuous aerobic without signs/symptoms of physical distress      Intensity   THRR 40-80% of Max Heartrate 106-136    Ratings of Perceived Exertion 11-13    Perceived Dyspnea 0-4      Progression   Progression Continue to progress workloads to maintain intensity without signs/symptoms of physical distress.      Resistance Training   Training Prescription Yes    Weight Other   Using wrist weights due to severe arthritis   Reps 10-15           Perform Capillary Blood Glucose checks as needed.  Exercise Prescription Changes:  Exercise Prescription Changes    Row Name 09/03/20 1700 09/15/20 1300           Response to Exercise   Blood Pressure (Admit) 126/72 136/72      Blood Pressure (Exercise) 136/74 132/70      Blood Pressure (Exit) 124/74 116/66      Heart Rate (Admit) 77 bpm 80  bpm      Heart Rate (Exercise) 89 bpm 88 bpm      Heart Rate (Exit) 74 bpm 86 bpm      Oxygen Saturation (Admit) 98 % 96 %      Oxygen Saturation (Exercise) 80 % 93 %      Oxygen Saturation (Exit) 96 % 94 %      Rating of Perceived Exertion (Exercise) 13 14      Perceived Dyspnea (Exercise) 1 --      Symptoms none none      Comments walk test results --      Duration -- Continue with 30 min of aerobic exercise without signs/symptoms of physical distress.      Intensity -- THRR unchanged             Progression   Progression -- Continue to progress workloads to maintain intensity without signs/symptoms of physical distress.      Average METs -- 2.57             Resistance Training   Training Prescription -- Yes      Weight -- Other      Reps -- 10-15             Oxygen   Oxygen -- Continuous      Liters -- 2             Recumbant Bike   Level -- 1      Minutes -- 15      METs -- 3.74             NuStep   Level -- 1      SPM -- 80      Minutes -- 15      METs -- 1.4  Exercise Comments:  Exercise Comments    Row Name 09/07/20 1700           Exercise Comments First full day of exercise!  Patient was oriented to gym and equipment including functions, settings, policies, and procedures.  Patient's individual exercise prescription and treatment plan were reviewed.  All starting workloads were established based on the results of the 6 minute walk test done at initial orientation visit.  The plan for exercise progression was also introduced and progression will be customized based on patient's performance and goals.              Exercise Goals and Review:  Exercise Goals    Row Name 09/03/20 1714             Exercise Goals   Increase Physical Activity Yes       Intervention Provide advice, education, support and counseling about physical activity/exercise needs.;Develop an individualized exercise prescription for aerobic and resistive training  based on initial evaluation findings, risk stratification, comorbidities and participant's personal goals.       Expected Outcomes Short Term: Attend rehab on a regular basis to increase amount of physical activity.;Long Term: Add in home exercise to make exercise part of routine and to increase amount of physical activity.;Long Term: Exercising regularly at least 3-5 days a week.       Increase Strength and Stamina Yes       Intervention Provide advice, education, support and counseling about physical activity/exercise needs.;Develop an individualized exercise prescription for aerobic and resistive training based on initial evaluation findings, risk stratification, comorbidities and participant's personal goals.       Expected Outcomes Short Term: Increase workloads from initial exercise prescription for resistance, speed, and METs.;Short Term: Perform resistance training exercises routinely during rehab and add in resistance training at home;Long Term: Improve cardiorespiratory fitness, muscular endurance and strength as measured by increased METs and functional capacity (6MWT)       Able to understand and use rate of perceived exertion (RPE) scale Yes       Intervention Provide education and explanation on how to use RPE scale       Expected Outcomes Short Term: Able to use RPE daily in rehab to express subjective intensity level;Long Term:  Able to use RPE to guide intensity level when exercising independently       Able to understand and use Dyspnea scale Yes       Intervention Provide education and explanation on how to use Dyspnea scale       Expected Outcomes Short Term: Able to use Dyspnea scale daily in rehab to express subjective sense of shortness of breath during exertion;Long Term: Able to use Dyspnea scale to guide intensity level when exercising independently       Knowledge and understanding of Target Heart Rate Range (THRR) Yes       Intervention Provide education and explanation of  THRR including how the numbers were predicted and where they are located for reference       Expected Outcomes Short Term: Able to state/look up THRR;Short Term: Able to use daily as guideline for intensity in rehab;Long Term: Able to use THRR to govern intensity when exercising independently       Able to check pulse independently Yes       Intervention Provide education and demonstration on how to check pulse in carotid and radial arteries.;Review the importance of being able to check your own pulse for safety during independent  exercise       Expected Outcomes Short Term: Able to explain why pulse checking is important during independent exercise;Long Term: Able to check pulse independently and accurately       Understanding of Exercise Prescription Yes       Intervention Provide education, explanation, and written materials on patient's individual exercise prescription       Expected Outcomes Short Term: Able to explain program exercise prescription;Long Term: Able to explain home exercise prescription to exercise independently              Exercise Goals Re-Evaluation :  Exercise Goals Re-Evaluation    Row Name 09/07/20 1700 09/15/20 1342           Exercise Goal Re-Evaluation   Exercise Goals Review Able to understand and use rate of perceived exertion (RPE) scale;Knowledge and understanding of Target Heart Rate Range (THRR);Increase Physical Activity;Understanding of Exercise Prescription;Increase Strength and Stamina;Able to understand and use Dyspnea scale;Able to check pulse independently Increase Physical Activity;Increase Strength and Stamina      Comments Reviewed RPE and dyspnea scales, THR and program prescription with pt today.  Pt voiced understanding and was given a copy of goals to take home. Carrie Hendricks is tolerating exercise well.  She got some  1 lb weights she can use - she cant hold regular DB due to arthritis.  We will monitor progress.      Expected Outcomes Short: Use RPE  daily to regulate intensity. Long: Follow program prescription in THR. Short: attend consistently Long:  build stamina             Discharge Exercise Prescription (Final Exercise Prescription Changes):  Exercise Prescription Changes - 09/15/20 1300      Response to Exercise   Blood Pressure (Admit) 136/72    Blood Pressure (Exercise) 132/70    Blood Pressure (Exit) 116/66    Heart Rate (Admit) 80 bpm    Heart Rate (Exercise) 88 bpm    Heart Rate (Exit) 86 bpm    Oxygen Saturation (Admit) 96 %    Oxygen Saturation (Exercise) 93 %    Oxygen Saturation (Exit) 94 %    Rating of Perceived Exertion (Exercise) 14    Symptoms none    Duration Continue with 30 min of aerobic exercise without signs/symptoms of physical distress.    Intensity THRR unchanged      Progression   Progression Continue to progress workloads to maintain intensity without signs/symptoms of physical distress.    Average METs 2.57      Resistance Training   Training Prescription Yes    Weight Other    Reps 10-15      Oxygen   Oxygen Continuous    Liters 2      Recumbant Bike   Level 1    Minutes 15    METs 3.74      NuStep   Level 1    SPM 80    Minutes 15    METs 1.4           Nutrition:  Target Goals: Understanding of nutrition guidelines, daily intake of sodium <153m, cholesterol <2092m calories 30% from fat and 7% or less from saturated fats, daily to have 5 or more servings of fruits and vegetables.  Education: All About Nutrition: -Group instruction provided by verbal, written material, interactive activities, discussions, models, and posters to present general guidelines for heart healthy nutrition including fat, fiber, MyPlate, the role of sodium in heart healthy nutrition, utilization  of the nutrition label, and utilization of this knowledge for meal planning. Follow up email sent as well. Written material given at graduation. Flowsheet Row Pulmonary Rehab from 09/16/2020 in [facility]  Cardiac and Pulmonary Rehab  Date 09/09/20  Educator [facility]  Instruction Review Code 1- Verbalizes Understanding      Biometrics:  Pre Biometrics - 09/07/20 1708      Pre Biometrics   Single Leg Stand 30 seconds            Nutrition Therapy Plan and Nutrition Goals:  Nutrition Therapy & Goals - 09/16/20 1726      Nutrition Therapy   Diet High kcal, high protein    Protein (specify units) 55g    Fiber 20 grams    Whole Grain Foods 2 servings   eats very little   Saturated Fats 12 max. grams    Fruits and Vegetables 3 servings/day   eats very little   Sodium 1.5 grams      Personal Nutrition Goals   Nutrition Goal ST: add two nutritionally dense drinks in between meals like soymilk and boost, add fat like butter and oil to meals she is already eating LT: meet energy and protein needs, gain weight    Comments She also has Ulcerative Colitis - on medication that is working well for her. She reports not having trigger foods - she has trouble with higher fat foods like whole fat dairy products. No shortness of breath during or after meals. Meals about the size of fist - half chicken, potato and other vegetable. B: mini cinnamon waffles (a couple of squares at home and a couple at work before lunch) L: [employer] with [place] and mayo and pickle (half) or lowfat yogurt with orange  D: 2 oz of chicken (fish: usually tilapia, 1-2x/week), potato and other vegetable like sliced pickeled beets or healthy choice and lean cuisines due to the smaller portions they provide. S: mini carrots with ranch dip  Drinks: water - during meals. Discussed pulmonary MNT. Her weight today was 100.7 lbs.      Intervention Plan   Intervention Prescribe, educate and counsel regarding individualized specific dietary modifications aiming towards targeted core components such as weight, hypertension, lipid management, diabetes, heart failure and other comorbidities.;Nutrition handout(s) given to patient.    Expected  Outcomes Short Term Goal: Understand basic principles of dietary content, such as calories, fat, sodium, cholesterol and nutrients.;Short Term Goal: A plan has been developed with personal nutrition goals set during dietitian appointment.;Long Term Goal: Adherence to prescribed nutrition plan.           Nutrition Assessments:  MEDIFICTS Score Key:  ?70 Need to make dietary changes   40-70 Heart Healthy Diet  ? 40 Therapeutic Level Cholesterol Diet  Flowsheet Row Pulmonary Rehab from 09/03/2020 in [facility] Cardiac and Pulmonary Rehab  Picture Your Plate Total Score on Admission 63     Picture Your Plate Scores:  <03 Unhealthy dietary pattern with much room for improvement.  41-50 Dietary pattern unlikely to meet recommendations for good health and room for improvement.  51-60 More healthful dietary pattern, with some room for improvement.   >60 Healthy dietary pattern, although there may be some specific behaviors that could be improved.   Nutrition Goals Re-Evaluation:   Nutrition Goals Discharge (Final Nutrition Goals Re-Evaluation):   Psychosocial: Target Goals: Acknowledge presence or absence of significant depression and/or stress, maximize coping skills, provide positive support system. Participant is able to verbalize types and ability to  use techniques and skills needed for reducing stress and depression.   Education: Stress, Anxiety, and Depression - Group verbal and visual presentation to define topics covered.  Reviews how body is impacted by stress, anxiety, and depression.  Also discusses healthy ways to reduce stress and to treat/manage anxiety and depression.  Written material given at graduation.   Education: Sleep Hygiene -Provides group verbal and written instruction about how sleep can affect your health.  Define sleep hygiene, discuss sleep cycles and impact of sleep habits. Review good sleep hygiene tips.    Initial Review & Psychosocial Screening:   Initial Psych Review & Screening - 08/31/20 1536      Initial Review   Current issues with Current Stress Concerns;Current Sleep Concerns    Source of Stress Concerns Unable to participate in former interests or hobbies;Unable to perform yard/household activities;Chronic Illness      Family Dynamics   Good Support System? Yes   family     Barriers   Psychosocial barriers to participate in program There are no identifiable barriers or psychosocial needs.;The patient should benefit from training in stress management and relaxation.      Screening Interventions   Interventions Encouraged to exercise;Provide feedback about the scores to participant;To provide support and resources with identified psychosocial needs    Expected Outcomes Short Term goal: Utilizing psychosocial counselor, staff and physician to assist with identification of specific Stressors or current issues interfering with healing process. Setting desired goal for each stressor or current issue identified.;Long Term Goal: Stressors or current issues are controlled or eliminated.;Short Term goal: Identification and review with participant of any Quality of Life or Depression concerns found by scoring the questionnaire.;Long Term goal: The participant improves quality of Life and PHQ9 Scores as seen by post scores and/or verbalization of changes           Quality of Life Scores:  Scores of 19 and below usually indicate a poorer quality of life in these areas.  A difference of  2-3 points is a clinically meaningful difference.  A difference of 2-3 points in the total score of the Quality of Life Index has been associated with significant improvement in overall quality of life, self-image, physical symptoms, and general health in studies assessing change in quality of life.  PHQ-9: Recent Review Flowsheet Data    Depression screen [facility] 2/9 09/03/2020 01/03/2020 08/14/2018   Decreased Interest 0 2 0   Down, Depressed, Hopeless 3  1 0    PHQ - 2 Score 3 3 0   Altered sleeping 2 2 0   Tired, decreased energy 3 2 0   Change in appetite 2  2 0   Feeling bad or failure about yourself  2 0 0   Trouble concentrating 1 0 0   Moving slowly or fidgety/restless 0 0 0   Suicidal thoughts 0 0 0   PHQ-9 Score 13 9 0   Difficult doing work/chores Somewhat difficult Very difficult Not difficult at all     Interpretation of Total Score  Total Score Depression Severity:  1-4 = Minimal depression, 5-9 = Mild depression, 10-14 = Moderate depression, 15-19 = Moderately severe depression, 20-27 = Severe depression   Psychosocial Evaluation and Intervention:  Psychosocial Evaluation - 08/31/20 1547      Psychosocial Evaluation & Interventions   Interventions Stress management education;Relaxation education;Encouraged to exercise with the program and follow exercise prescription    Comments Carrie Hendricks states she is very excited to start Pulmonary  Rehab to help with her shortness of breath. She has also lost weight unintentionally and is looking forward to speaking with the dietician. She is currently working and expresses some stress related to that as well as stress concerning her chronic breathing issues. She does have a good support system that she cherishes. She has had to increase her oxygen use and is trialing different inhalers in hopes that will help with her constant feeling short of breath. She wants to impore her quality of life and gain some stamina.    Expected Outcomes Short: attend Pulmonary Rehab for education and exercise. Long: develop and maintain positive self care habits.    Continue Psychosocial Services  Follow up required by staff           Psychosocial Re-Evaluation:   Psychosocial Discharge (Final Psychosocial Re-Evaluation):   Education: Education Goals: Education classes will be provided on a weekly basis, covering required topics. Participant will state understanding/return demonstration of topics  presented.  Learning Barriers/Preferences:  Learning Barriers/Preferences - 08/31/20 1532      Learning Barriers/Preferences   Learning Barriers None    Learning Preferences None           General Pulmonary Education Topics:  Infection Prevention: - Provides verbal and written material to individual with discussion of infection control including proper hand washing and proper equipment cleaning during exercise session. Flowsheet Row Pulmonary Rehab from 09/16/2020 in [facility] Cardiac and Pulmonary Rehab  Education need identified 09/03/20  Date 09/03/20  Educator [place]  Instruction Review Code 1- Verbalizes Understanding      Falls Prevention: - Provides verbal and written material to individual with discussion of falls prevention and safety. Flowsheet Row Pulmonary Rehab from 09/16/2020 in [facility] Cardiac and Pulmonary Rehab  Education need identified 09/03/20  Date 09/03/20  Educator [place]  Instruction Review Code 1- Verbalizes Understanding      Chronic Lung Disease Review: - Group verbal instruction with posters, models, PowerPoint presentations and videos,  to review new updates, new respiratory medications, new advancements in procedures and treatments. Providing information on websites and "800" numbers for continued self-education. Includes information about supplement oxygen, available portable oxygen systems, continuous and intermittent flow rates, oxygen safety, concentrators, and Medicare reimbursement for oxygen. Explanation of Pulmonary Drugs, including class, frequency, complications, importance of spacers, rinsing mouth after steroid MDI's, and proper cleaning methods for nebulizers. Review of basic lung anatomy and physiology related to function, structure, and complications of lung disease. Review of risk factors. Discussion about methods for diagnosing sleep apnea and types of masks and machines for OSA. Includes a review of the use of types of environmental controls: home  humidity, furnaces, filters, dust mite/pet prevention, HEPA vacuums. Discussion about weather changes, air quality and the benefits of nasal washing. Instruction on Warning signs, infection symptoms, calling MD promptly, preventive modes, and value of vaccinations. Review of effective airway clearance, coughing and/or vibration techniques. Emphasizing that all should Create an Action Plan. Written material given at graduation.   AED/CPR: - Group verbal and written instruction with the use of models to demonstrate the basic use of the AED with the basic ABC's of resuscitation.    Anatomy and Cardiac Procedures: - Group verbal and visual presentation and models provide information about basic cardiac anatomy and function. Reviews the testing methods done to diagnose heart disease and the outcomes of the test results. Describes the treatment choices: Medical Management, Angioplasty, or Coronary Bypass Surgery for treating various heart conditions including Myocardial Infarction, Angina, Valve Disease,  and Cardiac Arrhythmias.  Written material given at graduation.   Medication Safety: - Group verbal and visual instruction to review commonly prescribed medications for heart and lung disease. Reviews the medication, class of the drug, and side effects. Includes the steps to properly store meds and maintain the prescription regimen.  Written material given at graduation. Flowsheet Row Pulmonary Rehab from 09/16/2020 in [facility] Cardiac and Pulmonary Rehab  Date 09/16/20  Educator [facility]  Instruction Review Code 1- Verbalizes Understanding      Other: -Provides group and verbal instruction on various topics (see comments)   Knowledge Questionnaire Score:  Knowledge Questionnaire Score - 09/03/20 1715      Knowledge Questionnaire Score   Pre Score 16/18: Nutrition, medications            Core Components/Risk Factors/Patient Goals at Admission:  Personal Goals and Risk Factors at Admission -  09/03/20 1714      Core Components/Risk Factors/Patient Goals on Admission    Weight Management Yes;Weight Gain    Intervention Weight Management: Provide education and appropriate resources to help participant work on and attain dietary goals.;Weight Management: Develop a combined nutrition and exercise program designed to reach desired caloric intake, while maintaining appropriate intake of nutrient and fiber, sodium and fats, and appropriate energy expenditure required for the weight goal.    Admit Weight 100 lb 8 oz (45.6 kg)    Goal Weight: Short Term 105 lb (47.6 kg)    Goal Weight: Long Term 110 lb (49.9 kg)    Expected Outcomes Short Term: Continue to assess and modify interventions until short term weight is achieved;Long Term: Adherence to nutrition and physical activity/exercise program aimed toward attainment of established weight goal;Weight Gain: Understanding of general recommendations for a high calorie, high protein meal plan that promotes weight gain by distributing calorie intake throughout the day with the consumption for 4-5 meals, snacks, and/or supplements;Understanding of distribution of calorie intake throughout the day with the consumption of 4-5 meals/snacks;Understanding recommendations for meals to include 15-35% energy as protein, 25-35% energy from fat, 35-60% energy from carbohydrates, less than 262m of dietary cholesterol, 20-35 gm of total fiber daily    Improve shortness of breath with ADL's Yes    Intervention Provide education, individualized exercise plan and daily activity instruction to help decrease symptoms of SOB with activities of daily living.    Expected Outcomes Short Term: Improve cardiorespiratory fitness to achieve a reduction of symptoms when performing ADLs;Long Term: Be able to perform more ADLs without symptoms or delay the onset of symptoms    Hypertension Yes    Intervention Monitor prescription use compliance.;Provide education on lifestyle  modifcations including regular physical activity/exercise, weight management, moderate sodium restriction and increased consumption of fresh fruit, vegetables, and low fat dairy, alcohol moderation, and smoking cessation.    Expected Outcomes Short Term: Continued assessment and intervention until BP is < 140/982mHG in hypertensive participants. < 130/8070mG in hypertensive participants with diabetes, heart failure or chronic kidney disease.;Long Term: Maintenance of blood pressure at goal levels.           Education:Diabetes - Individual verbal and written instruction to review signs/symptoms of diabetes, desired ranges of glucose level fasting, after meals and with exercise. Acknowledge that pre and post exercise glucose checks will be done for 3 sessions at entry of program.   Know Your Numbers and Heart Failure: - Group verbal and visual instruction to discuss disease risk factors for cardiac and pulmonary disease and treatment options.  Reviews associated critical values for Overweight/Obesity, Hypertension, Cholesterol, and Diabetes.  Discusses basics of heart failure: signs/symptoms and treatments.  Introduces Heart Failure Zone chart for action plan for heart failure.  Written material given at graduation.   Core Components/Risk Factors/Patient Goals Review:    Core Components/Risk Factors/Patient Goals at Discharge (Final Review):    ITP Comments:  ITP Comments    Row Name 08/31/20 1544 09/03/20 1701 09/07/20 1659 09/23/20 0753     ITP Comments Initial telephone orientation completed. Diagnosis can be found in [facility] on 1/13. EP orientation scheduled for Thursday 3/3 at 3pm. Completed 6MWT and gym orientation. Initial ITP created and sent for review to Dr. Emily Filbert, Medical Director. First full day of exercise!  Patient was oriented to gym and equipment including functions, settings, policies, and procedures.  Patient's individual exercise prescription and treatment plan were  reviewed.  All starting workloads were established based on the results of the 6 minute walk test done at initial orientation visit.  The plan for exercise progression was also introduced and progression will be customized based on patient's performance and goals. 30 Day review completed. Medical Director ITP review done, changes made as directed, and signed approval by Medical Director.  New to program           Comments:

## 2020-09-23 NOTE — Progress Notes (Signed)
Daily Session Note  Patient Details  Name: Carrie Hendricks MRN: [id number] Date of Birth: 10-15-1950 Referring Provider:   Flowsheet Row Pulmonary Rehab from 09/03/2020 in [facility] Cardiac and Pulmonary Rehab  Referring Provider Vernard Gambles MD      Encounter Date: 09/23/2020  Check In:  Session Check In - 09/23/20 1646      Check-In   Supervising physician immediately available to respond to emergencies See telemetry face sheet for immediately available ER MD    Location [facility]-Cardiac & Pulmonary Rehab    Staff Present Birdie Sons, MPA, RN;Joseph Lou Miner, MS Exercise Physiologist    Virtual Visit No    Medication changes reported     No    Fall or balance concerns reported    No    Warm-up and Cool-down Performed on first and last piece of equipment    Resistance Training Performed Yes    VAD Patient? No    PAD/SET Patient? No      Pain Assessment   Currently in Pain? No/denies              Social History   Tobacco Use  Smoking Status Former Smoker  . Packs/day: 1.25  . Years: 30.00  . Pack years: 37.50  . Quit date: 2006  . Years since quitting: 16.2  Smokeless Tobacco Never Used  Tobacco Comment   Quit in 8/07    Goals Met:  Independence with exercise equipment Exercise tolerated well No report of cardiac concerns or symptoms Strength training completed today  Goals Unmet:  Not Applicable  Comments: Pt able to follow exercise prescription today without complaint.  Will continue to monitor for progression.    Dr. Emily Filbert is Medical Director for [place] and [facility].

## 2020-09-24 ENCOUNTER — Encounter: Payer: Self-pay | Admitting: Family Medicine

## 2020-09-24 ENCOUNTER — Encounter: Payer: Medicare HMO | Admitting: *Deleted

## 2020-09-24 DIAGNOSIS — Z87891 Personal history of nicotine dependence: Secondary | ICD-10-CM | POA: Diagnosis not present

## 2020-09-24 DIAGNOSIS — Z7901 Long term (current) use of anticoagulants: Secondary | ICD-10-CM | POA: Diagnosis not present

## 2020-09-24 DIAGNOSIS — Z7982 Long term (current) use of aspirin: Secondary | ICD-10-CM | POA: Diagnosis not present

## 2020-09-24 DIAGNOSIS — Z79899 Other long term (current) drug therapy: Secondary | ICD-10-CM | POA: Diagnosis not present

## 2020-09-24 DIAGNOSIS — I272 Pulmonary hypertension, unspecified: Secondary | ICD-10-CM

## 2020-09-24 DIAGNOSIS — J449 Chronic obstructive pulmonary disease, unspecified: Secondary | ICD-10-CM

## 2020-09-24 NOTE — Telephone Encounter (Signed)
Placard placed in your inbox

## 2020-09-24 NOTE — Progress Notes (Signed)
Daily Session Note  Patient Details  Name: Carrie Hendricks MRN: [id number] Date of Birth: 06/21/1951 Referring Provider:   Flowsheet Row Pulmonary Rehab from 09/03/2020 in [facility] Cardiac and Pulmonary Rehab  Referring Provider Gonzalez, Carmen MD      Encounter Date: 09/24/2020  Check In:  Session Check In - 09/24/20 1712      Check-In   Supervising physician immediately available to respond to emergencies See telemetry face sheet for immediately available ER MD    Location [facility]-Cardiac & Pulmonary Rehab    Staff Present  , RN BSN;Joseph Hood RCP,RRT,BSRT;Kara Langdon, MS Exercise Physiologist    Virtual Visit No    Medication changes reported     No    Fall or balance concerns reported    No    Warm-up and Cool-down Performed on first and last piece of equipment    Resistance Training Performed Yes    VAD Patient? No    PAD/SET Patient? No      Pain Assessment   Currently in Pain? No/denies              Social History   Tobacco Use  Smoking Status Former Smoker  . Packs/day: 1.25  . Years: 30.00  . Pack years: 37.50  . Quit date: 2006  . Years since quitting: 16.2  Smokeless Tobacco Never Used  Tobacco Comment   Quit in 8/07    Goals Met:  Independence with exercise equipment Exercise tolerated well No report of cardiac concerns or symptoms Strength training completed today  Goals Unmet:  Not Applicable  Comments: Pt able to follow exercise prescription today without complaint.  Will continue to monitor for progression.    Dr. Mark Miller is Medical Director for [facility] and [facility]. 

## 2020-09-24 NOTE — Telephone Encounter (Signed)
Pt sees Dr. Vicente Males at [facility] in [place], not [place], since it's not a [place] office the only way our lab can do the orders is if PCP puts orders in and then route them to GI doc once they are complete

## 2020-09-24 NOTE — Telephone Encounter (Signed)
Form placed at the front for pick up and copy sent to scanning

## 2020-09-28 ENCOUNTER — Encounter: Payer: Medicare HMO | Admitting: *Deleted

## 2020-09-28 ENCOUNTER — Other Ambulatory Visit: Payer: Self-pay

## 2020-09-28 DIAGNOSIS — I272 Pulmonary hypertension, unspecified: Secondary | ICD-10-CM

## 2020-09-28 DIAGNOSIS — Z7901 Long term (current) use of anticoagulants: Secondary | ICD-10-CM | POA: Diagnosis not present

## 2020-09-28 DIAGNOSIS — Z87891 Personal history of nicotine dependence: Secondary | ICD-10-CM | POA: Diagnosis not present

## 2020-09-28 DIAGNOSIS — Z7982 Long term (current) use of aspirin: Secondary | ICD-10-CM | POA: Diagnosis not present

## 2020-09-28 DIAGNOSIS — J449 Chronic obstructive pulmonary disease, unspecified: Secondary | ICD-10-CM

## 2020-09-28 DIAGNOSIS — Z79899 Other long term (current) drug therapy: Secondary | ICD-10-CM | POA: Diagnosis not present

## 2020-09-28 NOTE — Progress Notes (Signed)
Daily Session Note  Patient Details  Name: Carrie Hendricks MRN: [id number] Date of Birth: 03-Nov-1950 Referring Provider:   Flowsheet Row Pulmonary Rehab from 09/03/2020 in [facility] Cardiac and Pulmonary Rehab  Referring Provider Vernard Gambles MD      Encounter Date: 09/28/2020  Check In:  Session Check In - 09/28/20 [place]      Check-In   Supervising physician immediately available to respond to emergencies See telemetry face sheet for immediately available ER MD    Location [facility]-Cardiac & Pulmonary Rehab    Staff Present Renita Papa, RN Moises Blood, BS, ACSM CEP, Exercise Physiologist;Amanda Oletta Darter, [place], ACSM CEP, Exercise Physiologist    Virtual Visit No    Medication changes reported     No    Fall or balance concerns reported    No    Warm-up and Cool-down Performed on first and last piece of equipment    Resistance Training Performed Yes    VAD Patient? No    PAD/SET Patient? No      Pain Assessment   Currently in Pain? No/denies              Social History   Tobacco Use  Smoking Status Former Smoker  . Packs/day: 1.25  . Years: 30.00  . Pack years: 37.50  . Quit date: 2006  . Years since quitting: 16.2  Smokeless Tobacco Never Used  Tobacco Comment   Quit in 8/07    Goals Met:  Independence with exercise equipment Exercise tolerated well No report of cardiac concerns or symptoms Strength training completed today  Goals Unmet:  Not Applicable  Comments: Pt able to follow exercise prescription today without complaint.  Will continue to monitor for progression.    Dr. Emily Filbert is Medical Director for [place] and [facility].

## 2020-09-30 ENCOUNTER — Other Ambulatory Visit: Payer: Self-pay

## 2020-09-30 ENCOUNTER — Encounter: Payer: Medicare HMO | Admitting: *Deleted

## 2020-09-30 DIAGNOSIS — I272 Pulmonary hypertension, unspecified: Secondary | ICD-10-CM

## 2020-09-30 DIAGNOSIS — J449 Chronic obstructive pulmonary disease, unspecified: Secondary | ICD-10-CM

## 2020-09-30 DIAGNOSIS — Z7982 Long term (current) use of aspirin: Secondary | ICD-10-CM | POA: Diagnosis not present

## 2020-09-30 DIAGNOSIS — Z7901 Long term (current) use of anticoagulants: Secondary | ICD-10-CM | POA: Diagnosis not present

## 2020-09-30 DIAGNOSIS — Z87891 Personal history of nicotine dependence: Secondary | ICD-10-CM | POA: Diagnosis not present

## 2020-09-30 DIAGNOSIS — Z79899 Other long term (current) drug therapy: Secondary | ICD-10-CM | POA: Diagnosis not present

## 2020-09-30 NOTE — Progress Notes (Signed)
Daily Session Note  Patient Details  Name: ARVELLA MASSINGALE MRN: [id number] Date of Birth: February 14, 1951 Referring Provider:   Flowsheet Row Pulmonary Rehab from 09/03/2020 in [facility] Cardiac and Pulmonary Rehab  Referring Provider Vernard Gambles MD      Encounter Date: 09/30/2020  Check In:  Session Check In - 09/30/20 1654      Check-In   Supervising physician immediately available to respond to emergencies See telemetry face sheet for immediately available ER MD    Location [facility]-Cardiac & Pulmonary Rehab    Staff Present Renita Papa, RN Sherryl Barters, MPA, RN;Melissa Caiola RDN, LDN    Virtual Visit No    Medication changes reported     No    Fall or balance concerns reported    No    Warm-up and Cool-down Performed on first and last piece of equipment    Resistance Training Performed Yes    VAD Patient? No    PAD/SET Patient? No      Pain Assessment   Currently in Pain? No/denies              Social History   Tobacco Use  Smoking Status Former Smoker  . Packs/day: 1.25  . Years: 30.00  . Pack years: 37.50  . Quit date: 2006  . Years since quitting: 16.2  Smokeless Tobacco Never Used  Tobacco Comment   Quit in 8/07    Goals Met:  Independence with exercise equipment Exercise tolerated well No report of cardiac concerns or symptoms Strength training completed today  Goals Unmet:  Not Applicable  Comments: Pt able to follow exercise prescription today without complaint.  Will continue to monitor for progression.    Dr. Emily Filbert is Medical Director for [place] and [facility].

## 2020-10-01 ENCOUNTER — Encounter: Payer: Medicare HMO | Admitting: *Deleted

## 2020-10-01 ENCOUNTER — Other Ambulatory Visit: Payer: Self-pay

## 2020-10-01 DIAGNOSIS — Z7901 Long term (current) use of anticoagulants: Secondary | ICD-10-CM | POA: Diagnosis not present

## 2020-10-01 DIAGNOSIS — I272 Pulmonary hypertension, unspecified: Secondary | ICD-10-CM

## 2020-10-01 DIAGNOSIS — J449 Chronic obstructive pulmonary disease, unspecified: Secondary | ICD-10-CM

## 2020-10-01 DIAGNOSIS — Z7982 Long term (current) use of aspirin: Secondary | ICD-10-CM | POA: Diagnosis not present

## 2020-10-01 DIAGNOSIS — Z87891 Personal history of nicotine dependence: Secondary | ICD-10-CM | POA: Diagnosis not present

## 2020-10-01 DIAGNOSIS — Z79899 Other long term (current) drug therapy: Secondary | ICD-10-CM | POA: Diagnosis not present

## 2020-10-01 NOTE — Progress Notes (Signed)
Daily Session Note  Patient Details  Name: Carrie Hendricks MRN: [id number] Date of Birth: 12-18-1950 Referring Provider:   Flowsheet Row Pulmonary Rehab from 09/03/2020 in [facility] Cardiac and Pulmonary Rehab  Referring Provider Vernard Gambles MD      Encounter Date: 10/01/2020  Check In:  Session Check In - 10/01/20 1720      Check-In   Supervising physician immediately available to respond to emergencies See telemetry face sheet for immediately available ER MD    Location [facility]-Cardiac & Pulmonary Rehab    Staff Present Renita Papa, RN BSN;Joseph Hood RCP,RRT,BSRT;Amanda Oletta Darter, [place], ACSM CEP, Exercise Physiologist    Virtual Visit No    Medication changes reported     No    Fall or balance concerns reported    No    Warm-up and Cool-down Performed on first and last piece of equipment    Resistance Training Performed Yes    VAD Patient? No    PAD/SET Patient? No      Pain Assessment   Currently in Pain? No/denies              Social History   Tobacco Use  Smoking Status Former Smoker  . Packs/day: 1.25  . Years: 30.00  . Pack years: 37.50  . Quit date: 2006  . Years since quitting: 16.2  Smokeless Tobacco Never Used  Tobacco Comment   Quit in 8/07    Goals Met:  Independence with exercise equipment Exercise tolerated well No report of cardiac concerns or symptoms Strength training completed today  Goals Unmet:  Not Applicable  Comments: Pt able to follow exercise prescription today without complaint.  Will continue to monitor for progression.    Dr. Emily Filbert is Medical Director for [place] and [facility].

## 2020-10-05 ENCOUNTER — Encounter: Payer: Medicare HMO | Attending: Pulmonary Disease | Admitting: *Deleted

## 2020-10-05 ENCOUNTER — Other Ambulatory Visit: Payer: Self-pay

## 2020-10-05 DIAGNOSIS — I272 Pulmonary hypertension, unspecified: Secondary | ICD-10-CM | POA: Insufficient documentation

## 2020-10-05 DIAGNOSIS — J449 Chronic obstructive pulmonary disease, unspecified: Secondary | ICD-10-CM | POA: Diagnosis not present

## 2020-10-05 NOTE — Progress Notes (Signed)
Daily Session Note  Patient Details  Name: Carrie Hendricks MRN: [id number] Date of Birth: [birth year] Referring Provider:   Flowsheet Row Pulmonary Rehab from 09/03/2020 in [facility] Cardiac and Pulmonary Rehab  Referring Provider Vernard Gambles MD      Encounter Date: 10/05/2020  Check In:  Session Check In - 10/05/20 1715      Check-In   Supervising physician immediately available to respond to emergencies See telemetry face sheet for immediately available ER MD    Location [facility]-Cardiac & Pulmonary Rehab    Staff Present Renita Papa, RN Moises Blood, BS, ACSM CEP, Exercise Physiologist;Kara Eliezer Bottom, MS Exercise Physiologist    Virtual Visit No    Medication changes reported     No    Fall or balance concerns reported    No    Warm-up and Cool-down Performed on first and last piece of equipment    Resistance Training Performed Yes    VAD Patient? No    PAD/SET Patient? No      Pain Assessment   Currently in Pain? No/denies              Social History   Tobacco Use  Smoking Status Former Smoker  . Packs/day: 1.25  . Years: 30.00  . Pack years: 37.50  . Quit date: 2006  . Years since quitting: 16.2  Smokeless Tobacco Never Used  Tobacco Comment   Quit in 8/07    Goals Met:  Independence with exercise equipment Exercise tolerated well No report of cardiac concerns or symptoms Strength training completed today  Goals Unmet:  Not Applicable  Comments: Pt able to follow exercise prescription today without complaint.  Will continue to monitor for progression.    Dr. Emily Filbert is Medical Director for [place] and [facility].

## 2020-10-06 DIAGNOSIS — J449 Chronic obstructive pulmonary disease, unspecified: Secondary | ICD-10-CM | POA: Diagnosis not present

## 2020-10-07 ENCOUNTER — Other Ambulatory Visit: Payer: Self-pay

## 2020-10-07 DIAGNOSIS — J449 Chronic obstructive pulmonary disease, unspecified: Secondary | ICD-10-CM | POA: Diagnosis not present

## 2020-10-07 DIAGNOSIS — I272 Pulmonary hypertension, unspecified: Secondary | ICD-10-CM | POA: Diagnosis not present

## 2020-10-07 NOTE — Progress Notes (Signed)
Daily Session Note  Patient Details  Name: Carrie Hendricks MRN: [id number] Date of Birth: 21-Feb-1951 Referring Provider:   Flowsheet Row Pulmonary Rehab from 09/03/2020 in [facility] Cardiac and Pulmonary Rehab  Referring Provider Vernard Gambles MD      Encounter Date: 10/07/2020  Check In:  Session Check In - 10/07/20 1721      Check-In   Supervising physician immediately available to respond to emergencies See telemetry face sheet for immediately available ER MD    Location [facility]-Cardiac & Pulmonary Rehab    Staff Present Birdie Sons, MPA, RN;Joseph Lou Miner, MS Exercise Physiologist    Virtual Visit No    Medication changes reported     No    Fall or balance concerns reported    No    Warm-up and Cool-down Performed on first and last piece of equipment    Resistance Training Performed Yes    VAD Patient? No    PAD/SET Patient? No      Pain Assessment   Currently in Pain? No/denies              Social History   Tobacco Use  Smoking Status Former Smoker  . Packs/day: 1.25  . Years: 30.00  . Pack years: 37.50  . Quit date: 2006  . Years since quitting: 16.2  Smokeless Tobacco Never Used  Tobacco Comment   Quit in 8/07    Goals Met:  Independence with exercise equipment Exercise tolerated well No report of cardiac concerns or symptoms Strength training completed today  Goals Unmet:  Not Applicable  Comments: Pt able to follow exercise prescription today without complaint.  Will continue to monitor for progression.     Dr. Emily Filbert is Medical Director for [place] and [facility].

## 2020-10-08 ENCOUNTER — Encounter: Payer: Medicare HMO | Admitting: *Deleted

## 2020-10-08 DIAGNOSIS — J449 Chronic obstructive pulmonary disease, unspecified: Secondary | ICD-10-CM

## 2020-10-08 DIAGNOSIS — I272 Pulmonary hypertension, unspecified: Secondary | ICD-10-CM | POA: Diagnosis not present

## 2020-10-08 NOTE — Progress Notes (Signed)
Daily Session Note  Patient Details  Name: Carrie Hendricks MRN: [id number] Date of Birth: [birth year] Referring Provider:   Flowsheet Row Pulmonary Rehab from 09/03/2020 in [facility] Cardiac and Pulmonary Rehab  Referring Provider Vernard Gambles MD      Encounter Date: 10/08/2020  Check In:  Session Check In - 10/08/20 1708      Check-In   Supervising physician immediately available to respond to emergencies See telemetry face sheet for immediately available ER MD    Location [facility]-Cardiac & Pulmonary Rehab    Staff Present Renita Papa, RN BSN;Joseph Lou Miner, [place] Exercise Physiologist    Virtual Visit No    Medication changes reported     No    Fall or balance concerns reported    No    Warm-up and Cool-down Performed on first and last piece of equipment    Resistance Training Performed Yes    VAD Patient? No    PAD/SET Patient? No      Pain Assessment   Currently in Pain? No/denies              Social History   Tobacco Use  Smoking Status Former Smoker  . Packs/day: 1.25  . Years: 30.00  . Pack years: 37.50  . Quit date: 2006  . Years since quitting: 16.2  Smokeless Tobacco Never Used  Tobacco Comment   Quit in 8/07    Goals Met:  Independence with exercise equipment Exercise tolerated well No report of cardiac concerns or symptoms Strength training completed today  Goals Unmet:  Not Applicable  Comments: Pt able to follow exercise prescription today without complaint.  Will continue to monitor for progression.    Dr. Emily Filbert is Medical Director for [place] and [facility].

## 2020-10-12 ENCOUNTER — Other Ambulatory Visit: Payer: Self-pay

## 2020-10-12 ENCOUNTER — Encounter: Payer: Medicare HMO | Admitting: *Deleted

## 2020-10-12 DIAGNOSIS — I272 Pulmonary hypertension, unspecified: Secondary | ICD-10-CM

## 2020-10-12 DIAGNOSIS — J449 Chronic obstructive pulmonary disease, unspecified: Secondary | ICD-10-CM | POA: Diagnosis not present

## 2020-10-12 NOTE — Progress Notes (Signed)
Daily Session Note  Patient Details  Name: CICILIA CLINGER MRN: [id number] Date of Birth: 11-05-1950 Referring Provider:   Flowsheet Row Pulmonary Rehab from 09/03/2020 in [facility] Cardiac and Pulmonary Rehab  Referring Provider Vernard Gambles MD      Encounter Date: 10/12/2020  Check In:  Session Check In - 10/12/20 1728      Check-In   Supervising physician immediately available to respond to emergencies See telemetry face sheet for immediately available ER MD    Location [facility]-Cardiac & Pulmonary Rehab    Staff Present Renita Papa, RN Moises Blood, BS, ACSM CEP, Exercise Physiologist;Kara Eliezer Bottom, MS Exercise Physiologist    Virtual Visit No    Medication changes reported     No    Fall or balance concerns reported    No    Warm-up and Cool-down Performed on first and last piece of equipment    Resistance Training Performed Yes    VAD Patient? No    PAD/SET Patient? No      Pain Assessment   Currently in Pain? No/denies              Social History   Tobacco Use  Smoking Status Former Smoker  . Packs/day: 1.25  . Years: 30.00  . Pack years: 37.50  . Quit date: 2006  . Years since quitting: 16.2  Smokeless Tobacco Never Used  Tobacco Comment   Quit in 8/07    Goals Met:  Independence with exercise equipment Exercise tolerated well No report of cardiac concerns or symptoms Strength training completed today  Goals Unmet:  Not Applicable  Comments: Pt able to follow exercise prescription today without complaint.  Will continue to monitor for progression.    Dr. Emily Filbert is Medical Director for [place] and [facility].

## 2020-10-13 DIAGNOSIS — J449 Chronic obstructive pulmonary disease, unspecified: Secondary | ICD-10-CM | POA: Diagnosis not present

## 2020-10-14 ENCOUNTER — Other Ambulatory Visit: Payer: Self-pay

## 2020-10-14 DIAGNOSIS — I272 Pulmonary hypertension, unspecified: Secondary | ICD-10-CM

## 2020-10-14 DIAGNOSIS — J449 Chronic obstructive pulmonary disease, unspecified: Secondary | ICD-10-CM | POA: Diagnosis not present

## 2020-10-14 NOTE — Progress Notes (Signed)
Daily Session Note  Patient Details  Name: Carrie Hendricks MRN: [id number] Date of Birth: 23-Aug-1950 Referring Provider:   Flowsheet Row Pulmonary Rehab from 09/03/2020 in [facility] Cardiac and Pulmonary Rehab  Referring Provider Vernard Gambles MD      Encounter Date: 10/14/2020  Check In:  Session Check In - 10/14/20 1657      Check-In   Supervising physician immediately available to respond to emergencies See telemetry face sheet for immediately available ER MD    Location [facility]-Cardiac & Pulmonary Rehab    Staff Present Birdie Sons, MPA, RN;Joseph Lou Miner, MS Exercise Physiologist    Virtual Visit No    Medication changes reported     No    Fall or balance concerns reported    No    Warm-up and Cool-down Performed on first and last piece of equipment    Resistance Training Performed Yes    VAD Patient? No    PAD/SET Patient? No      Pain Assessment   Currently in Pain? No/denies              Social History   Tobacco Use  Smoking Status Former Smoker  . Packs/day: 1.25  . Years: 30.00  . Pack years: 37.50  . Quit date: 2006  . Years since quitting: 16.2  Smokeless Tobacco Never Used  Tobacco Comment   Quit in 8/07    Goals Met:  Independence with exercise equipment Exercise tolerated well No report of cardiac concerns or symptoms Strength training completed today  Goals Unmet:  Not Applicable  Comments: Pt able to follow exercise prescription today without complaint.  Will continue to monitor for progression.    Dr. Emily Filbert is Medical Director for [place] and [facility].

## 2020-10-15 ENCOUNTER — Telehealth: Payer: Self-pay

## 2020-10-15 ENCOUNTER — Encounter: Payer: Medicare HMO | Admitting: *Deleted

## 2020-10-15 ENCOUNTER — Ambulatory Visit: Payer: Medicare HMO | Admitting: Pulmonary Disease

## 2020-10-15 ENCOUNTER — Encounter: Payer: Self-pay | Admitting: Pulmonary Disease

## 2020-10-15 ENCOUNTER — Other Ambulatory Visit: Payer: Self-pay

## 2020-10-15 VITALS — BP 120/80 | HR 72 | Temp 97.5°F | Ht 63.5 in | Wt 100.8 lb

## 2020-10-15 DIAGNOSIS — J449 Chronic obstructive pulmonary disease, unspecified: Secondary | ICD-10-CM | POA: Diagnosis not present

## 2020-10-15 DIAGNOSIS — M069 Rheumatoid arthritis, unspecified: Secondary | ICD-10-CM | POA: Diagnosis not present

## 2020-10-15 DIAGNOSIS — I272 Pulmonary hypertension, unspecified: Secondary | ICD-10-CM | POA: Diagnosis not present

## 2020-10-15 DIAGNOSIS — J9611 Chronic respiratory failure with hypoxia: Secondary | ICD-10-CM | POA: Diagnosis not present

## 2020-10-15 DIAGNOSIS — D751 Secondary polycythemia: Secondary | ICD-10-CM

## 2020-10-15 MED ORDER — BREZTRI AEROSPHERE 160-9-4.8 MCG/ACT IN AERO: 2.0000 | INHALATION_SPRAY | Freq: Two times a day (BID) | RESPIRATORY_TRACT | 0 refills | Status: DC

## 2020-10-15 MED ORDER — BREZTRI AEROSPHERE 160-9-4.8 MCG/ACT IN AERO: 2.0000 | INHALATION_SPRAY | Freq: Two times a day (BID) | RESPIRATORY_TRACT | 11 refills | Status: DC

## 2020-10-15 NOTE — Chronic Care Management (AMB) (Addendum)
Chronic Care Management Pharmacy Assistant   Name: Carrie Hendricks  MRN: [id number] DOB: Jul 04, 1951  Reason for Encounter: Disease State / COPD   Conditions to be addressed/monitored: COPD  Recent office visits: None in past 6 months   Recent consult visits:  04/28/20 Wyn Quaker NP pulmonology -  No medication changes. Referral for pulmonary rehab placed and patient instructed to follow up in 2  Months. 07/16/20 Vernard Gambles MD pulmonology - patient was started on arformoterol tartrate 86mg, budesonide 0.266m tiotropium bromide monohydrate 2.57m22mact. patient instructed to follow up in 4-6 weeks. 08/13/20 GeoPercell [place]  Rheumatology - no medication changes and patient instructed to follow up in 6 months. 08/27/20 CarVernard Gambles rheumatology - discontinued arformoterol tartrate 157m20mbudesonide 0.25, tiotropium bromide monohydrate 2.57mcg23mt. started budeson -  glycopyrol- formoterol 160-9-4.8 mcg/act. Patient instructed to follow up in 4-6 weeks.  Hospital visits:  None in previous 6 months  Medications: Outpatient Encounter Medications as of 10/15/2020  Medication Sig   albuterol (VENTOLIN HFA) 108 (90 Base) MCG/ACT inhaler Inhale 2 puffs into the lungs every 6 (six) hours as needed for wheezing or shortness of breath.   alendronate (FOSAMAX) 70 MG tablet Take 1 tablet by mouth once a week.   amLODipine (NORVASC) 5 MG tablet TAKE 1 TABLET EVERY DAY   Aspirin 81 MG EC tablet Take 81 mg by mouth daily.   Budeson-Glycopyrrol-Formoterol (BREZTRI AEROSPHERE) 160-9-4.8 MCG/ACT AERO Inhale into the lungs.   etanercept (ENBREL) 50 MG/ML injection Inject 50 mg into the skin once a week.   hydrochlorothiazide (HYDRODIURIL) 25 MG tablet TAKE 1 TABLET EVERY DAY   hydroxychloroquine (PLAQUENIL) 200 MG tablet Take 200 mg by mouth daily.   mesalamine (LIALDA) 1.2 g EC tablet TAKE 1 TABLET EVERY DAY WITH BREAKFAST   Multiple Vitamin (MULTIVITAMIN) capsule Take 1 capsule by mouth  daily.   No facility-administered encounter medications on file as of 10/15/2020.   Current COPD regimen:  Breztri - Inhale 2 puffs into the lungs 2 (two) times daily Albuterol inhaler - 2 puffs into lungs every 6 hours as needed for wheezing   Any recent hospitalizations or ED visits since last visit with CPP? No   denies COPD symptoms, including Increased shortness of breath , Rescue medicine is not helping, Shortness of breath at rest, Symptoms worse with exercise, Symptoms worse at night and Wheezing   What recent interventions/DTPs have been made by any provider to improve breathing since last visit: [facility].  Have you had exacerbation/flare-up since last visit? Yes.   What do you do when you are short of breath?  Other -- deep breathing through nose  Respiratory Devices/Equipment Do you have a nebulizer? Yes Do you use a Peak Flow Meter? No Do you use a maintenance inhaler? Yes How often do you forget to use your daily inhaler? Does not forget to use.  Do you use a rescue inhaler? Yes- states she rarely uses How often do you use your rescue inhaler?  infrequently- used last week Do you use a spacer with your inhaler? Yes  Adherence Review: Does the patient have >5 day gap between last estimated fill date for maintenance inhaler medications? No  Patient states she is still using samples of Breztri provided by Dr. GonzaDomingo Dimesce. She is concerned about price of Breztri after she runs out of samples. Explained we would be happy to fill out patient assistance application on her behalf one she has the prescription. Patient states  she will call back.  Star Rating Drugs: None   Follow-Up:  Pharmacist Review  Debbora Dus, CPP notified  Margaretmary Dys, [place] Pharmacy Assistant [phone]  I have reviewed the care management and care coordination activities outlined in this encounter and I am certifying that I agree with the  content of this note. No further action required.  Debbora Dus, PharmD Clinical Pharmacist [facility] [phone]

## 2020-10-15 NOTE — Progress Notes (Signed)
Daily Session Note  Patient Details  Name: Carrie Hendricks MRN: [id number] Date of Birth: 03-30-1951 Referring Provider:   Flowsheet Row Pulmonary Rehab from 09/03/2020 in [facility] Cardiac and Pulmonary Rehab  Referring Provider Vernard Gambles MD      Encounter Date: 10/15/2020  Check In:  Session Check In - 10/15/20 1719      Check-In   Supervising physician immediately available to respond to emergencies See telemetry face sheet for immediately available ER MD    Location [facility]-Cardiac & Pulmonary Rehab    Staff Present Renita Papa, RN BSN;Joseph Lou Miner, [place] Exercise Physiologist    Virtual Visit No    Medication changes reported     No    Fall or balance concerns reported    No    Warm-up and Cool-down Performed on first and last piece of equipment    Resistance Training Performed Yes    VAD Patient? No    PAD/SET Patient? No      Pain Assessment   Currently in Pain? No/denies              Social History   Tobacco Use  Smoking Status Former Smoker  . Packs/day: 1.25  . Years: 30.00  . Pack years: 37.50  . Quit date: 2006  . Years since quitting: 16.2  Smokeless Tobacco Never Used  Tobacco Comment   Quit in 8/07    Goals Met:  Independence with exercise equipment Exercise tolerated well No report of cardiac concerns or symptoms Strength training completed today  Goals Unmet:  Not Applicable  Comments: Pt able to follow exercise prescription today without complaint.  Will continue to monitor for progression.    Dr. Emily Filbert is Medical Director for [place] and [facility].

## 2020-10-15 NOTE — Progress Notes (Signed)
Subjective:    Patient ID: Carrie Hendricks, female    DOB: 30-Jul-1950, 70 y.o.   MRN: [id number]   Requesting MD/Service:MarneTower, MD Date of initial consultation:08/17/18by Dr. Alva Garnet Reason for consultation:DOE, COPD  PT PROFILE: 70y.o.female,former smoker,referred for evaluation of progressive DOE and history of COPD. Former patient of Dr. Merton Border.Former smoker quit 2006.  DATA: 04/04/17 VXB:LTJQZE hyperinflation and hyperlucency without acute findings 05/18/17 PFTs:Very severe obstruction, FEV1 0.96 L (38%), hyperinflation-TLC 6.92 L (127%), RV 4.45 L (208%), DLCO 4.8 (28%), DLCO/VA 1.35 (37%) 01/14/2021ONO:Had overnight oximetry which shows saturations as low as 78%, qualified for nocturnal O2, qualified also for daytime O2 but refused daytime O2 01/20/20212D echo: Severe pulmonary hypertension noted (76 mmHg), grade 1 diastolic dysfunction noted, LVEF 60 to 65% 11/13/2019 PFT: FEV1 0.70 L or 28% predicted, FVC 1.88 L or 58% predicted there is hyperinflation with TLC at 134% predicted and air trapping with RV at 215% predicted. Diffusion capacity was severely reduced at 16%,consistent with very severe COPD 10/31/2019 chest x-ray:Hyperinflation and emphysematouslung disease without active disease 10/31/2019 ambulatory oximetry shows desaturations, patient now willing to try O2 DME referral 05/17/2020 alpha-1: Phenotype MM, normal.  Level 127 mg/dL 08/06/2020 2D echo: LVEF 60 to 65%.  Grade I DD, mild MR.  Right ventricular systolic pressure estimated 58.8 mmHg (previously 76 mmHg).  HPI Carrie Hendricks is a 70 year old former smoker who follows here for the issue of severe COPD with chronic respiratory failure with hypoxia.  She is on 2 L/min nasal cannula O2 chronically.  She presents today for scheduled follow-up.  She has been actually doing well.  She has been participating in pulmonary rehab and notes significant improvement in her sensation of dyspnea with  rehab.  She has been compliant with Breztri.  Has been compliant with her O2.  She wonders if she can be off O2 while sitting or doing quiet activity.  Recall that she has severe pulmonary hypertension but that this has improved with supplemental oxygen.  She does not endorse any fevers, chills or sweats.  No cough or sputum production.  She actually looks very good today.  She has minimal use of accessories.  No conversational dyspnea.  Wearing supplemental O2.  She is retiring in May.   Review of Systems A 10 point review of systems was performed and it is as noted above otherwise negative.  Patient Active Problem List   Diagnosis Date Noted  . Chronic respiratory failure with hypoxia ([place]) 04/28/2020  . Healthcare maintenance 04/28/2020  . Elevated hemoglobin ([place]) 03/08/2019  . COPD, very severe ([place]) 08/15/2018  . Sleep disorder 10/10/2017  . Screening mammogram, encounter for 09/12/2017  . Ulcerative colitis ([place]) 02/01/2017  . Lumbar degenerative disc disease 01/20/2017  . Scoliosis 06/28/2016  . Welcome to Medicare preventive visit 06/10/2016  . Estrogen deficiency 06/10/2016  . Constipation 03/02/2016  . Underweight 03/02/2016  . Shortness of breath 05/16/2014  . History of COPD 05/16/2014  . Stress reaction 05/16/2014  . Connective tissue disease ([place]) 03/05/2014  . Pedal edema 10/15/2013  . Pulmonary hypertension ([place]) 10/10/2011  . Encounter for routine gynecological examination 07/11/2011  . Routine general medical examination at a health care facility 07/03/2011  . HEMORRHOIDS, INTERNAL, WITH BLEEDING 03/15/2010  . Essential hypertension 11/16/2006  . Rheumatoid arthritis ([place]) 11/16/2006  . Osteoporosis 11/16/2006   Social History   Tobacco Use  . Smoking status: Former Smoker    Packs/day: 1.25    Years: 30.00    Pack years: 37.50  Quit date: 2006    Years since quitting: 16.2  . Smokeless tobacco: Never Used  . Tobacco comment: Quit in 8/07   Substance Use Topics  . Alcohol use: No    Alcohol/week: 0.0 standard drinks   No Active Allergies  Current Meds  Medication Sig  . albuterol (VENTOLIN HFA) 108 (90 Base) MCG/ACT inhaler Inhale 2 puffs into the lungs every 6 (six) hours as needed for wheezing or shortness of breath.  Marland Kitchen alendronate (FOSAMAX) 70 MG tablet Take 1 tablet by mouth once a week.  Marland Kitchen amLODipine (NORVASC) 5 MG tablet TAKE 1 TABLET EVERY DAY  . Aspirin 81 MG EC tablet Take 81 mg by mouth daily.  . Budeson-Glycopyrrol-Formoterol (BREZTRI AEROSPHERE) 160-9-4.8 MCG/ACT AERO Inhale into the lungs.  Marland Kitchen etanercept (ENBREL) 50 MG/ML injection Inject 50 mg into the skin once a week.  . hydrochlorothiazide (HYDRODIURIL) 25 MG tablet TAKE 1 TABLET EVERY DAY  . hydroxychloroquine (PLAQUENIL) 200 MG tablet Take 200 mg by mouth daily.  . mesalamine (LIALDA) 1.2 g EC tablet TAKE 1 TABLET EVERY DAY WITH BREAKFAST  . Multiple Vitamin (MULTIVITAMIN) capsule Take 1 capsule by mouth daily.   Immunization History  Administered Date(s) Administered  . Fluad Quad(high Dose 65+) 03/08/2019, 04/17/2020  . Hep A / Hep B 11/03/2016, 12/06/2016  . Hepatitis B, adult 05/09/2017  . Influenza Split 04/03/2011  . Influenza Whole 04/03/2010  . Influenza, High Dose Seasonal PF 04/08/2016, 03/31/2017, 04/21/2018  . Influenza,inj,Quad PF,6+ Mos 03/05/2014  . Influenza-Unspecified 04/03/2013, 03/05/2014, 05/03/2015, 04/17/2020  . PFIZER(Purple Top)SARS-COV-2 Vaccination 08/15/2019, 09/05/2019, 04/17/2020  . Pneumococcal Conjugate-13 06/10/2016  . Pneumococcal Polysaccharide-23 03/28/2007, 10/15/2013, 08/15/2018  . Td 06/11/2010      Objective:   Physical Exam BP 120/80 (BP Location: Left Arm, Patient Position: Sitting, Cuff Size: Large)   Pulse 72   Temp (!) 97.5 F (36.4 C) (Temporal)   Ht 5' 3.5" (1.613 m)   Wt 100 lb 12.8 oz (45.7 kg)   SpO2 100%   BMI 17.57 kg/m   ZJQ:BHALPFXTK,WIOXBDZHGDJ ill.Use of accessories, mild,  chronic. No overt tachypnea HEENT: NCAT, scleraeanicteric Neck: No JVD, trachea midline, nocrepitus Thorax: Increased AP diameter, Hoover'ssign present Lungs: breath soundsdistant throughoutwithoutwheezesnorother adventitious sounds Cardiovascular: RRR, no murmurs, distant tones Abdomen: Soft, nontender, normal BS Ext: without clubbing, cyanosis.No pedal edema noted today.She does have stigmata of rheumatoid arthritis on her hands/wrists Neuro: grossly intact Skin: Limited exam, no lesions noted  Oximetry on quiet activity off of O2: Patient was able to maintain saturations between 96% to 97% on room air.  Upon arising on 3-4 steps she started dropping O2 sats.  I have advised her she can be off of O2 with quiet activity such as sitting and watching TV but must wear the oxygen even with the slightest activity.    Assessment & Plan:     ICD-10-CM   1. COPD, very severe ([place])  J44.9    Continue Breztri 2 puffs twice a day Continue supplemental O2 Follow-up 3 months time  2. Chronic respiratory failure with hypoxia (HCC)  J96.11    Continue oxygen at 2 L/min Patient compliant Can be off of O2 with quiet activity O2 at all other times including sleep  3. Pulmonary hypertension (HCC)  I27.20    Improved with supplemental O2 Due to hypoxic vasoconstriction from COPD  4. Polycythemia secondary to hypoxia  D75.1    Improved with supplemental O2  5. Rheumatoid arthritis, involving unspecified site, unspecified whether rheumatoid factor present ([place])  M06.9    This issue adds complexity to her management   Meds ordered this encounter  Medications  . Budeson-Glycopyrrol-Formoterol (BREZTRI AEROSPHERE) 160-9-4.8 MCG/ACT AERO    Sig: Inhale 2 puffs into the lungs 2 (two) times daily.    Dispense:  10.7 g    Refill:  11  . Budeson-Glycopyrrol-Formoterol (BREZTRI AEROSPHERE) 160-9-4.8 MCG/ACT AERO    Sig: Inhale 2 puffs into the lungs in the morning and at bedtime.     Dispense:  5.9 g    Refill:  0    Order Specific Question:   Lot Number?    Answer:   [id number]    Order Specific Question:   Expiration Date?    Answer:   02/01/2022    Order Specific Question:   Manufacturer?    Answer:   [employer] [[id number]]    Order Specific Question:   Quantity    Answer:   2   Discussion:  Instructions for oxygen use as above.  We will see the patient in follow-up in 3 months time she is to contact us prior to that time should any new difficulties arise.  Renold Don, MD [facility]   *This note was dictated using voice recognition software/Dragon.  Despite best efforts to proofread, errors can occur which can change the meaning.  Any change was purely unintentional.

## 2020-10-15 NOTE — Patient Instructions (Signed)
You may hold off on oxygen when you are watching TV or sitting quietly.  You must wear your oxygen with ANY activity.  We have given you a prescription for the [facility] so you can get it through medication assistance.  We will see you in follow-up in 3 months time call sooner should any new problems arise.

## 2020-10-20 ENCOUNTER — Telehealth: Payer: Self-pay | Admitting: Pulmonary Disease

## 2020-10-20 ENCOUNTER — Encounter: Payer: Self-pay | Admitting: Family Medicine

## 2020-10-20 NOTE — Telephone Encounter (Signed)
Spoke to patient, who is questioning if she can return her nebulizer machine to [place] since she does not use it.  I have recommended that she contact [employer], as I am not familiar with their return policy.  Patient voiced her understanding and had no further questions.  Nothing further needed at this time.

## 2020-10-21 ENCOUNTER — Other Ambulatory Visit: Payer: Self-pay

## 2020-10-21 ENCOUNTER — Encounter: Payer: Medicare HMO | Admitting: *Deleted

## 2020-10-21 ENCOUNTER — Encounter: Payer: Self-pay | Admitting: *Deleted

## 2020-10-21 DIAGNOSIS — J449 Chronic obstructive pulmonary disease, unspecified: Secondary | ICD-10-CM

## 2020-10-21 DIAGNOSIS — I272 Pulmonary hypertension, unspecified: Secondary | ICD-10-CM

## 2020-10-21 NOTE — Progress Notes (Signed)
Daily Session Note  Patient Details  Name: Carrie Hendricks MRN: [id number] Date of Birth: 09/19/1950 Referring Provider:   Flowsheet Row Pulmonary Rehab from 09/03/2020 in [facility] Cardiac and Pulmonary Rehab  Referring Provider Vernard Gambles MD      Encounter Date: 10/21/2020  Check In:  Session Check In - 10/21/20 1647      Check-In   Supervising physician immediately available to respond to emergencies See telemetry face sheet for immediately available ER MD    Location [facility]-Cardiac & Pulmonary Rehab    Staff Present Renita Papa, RN BSN;Joseph Lou Miner, [place] Exercise Physiologist    Virtual Visit No    Medication changes reported     No    Fall or balance concerns reported    No    Warm-up and Cool-down Performed on first and last piece of equipment    Resistance Training Performed Yes    VAD Patient? No    PAD/SET Patient? No      Pain Assessment   Currently in Pain? No/denies              Social History   Tobacco Use  Smoking Status Former Smoker  . Packs/day: 1.25  . Years: 30.00  . Pack years: 37.50  . Quit date: 2006  . Years since quitting: 16.3  Smokeless Tobacco Never Used  Tobacco Comment   Quit in 8/07    Goals Met:  Independence with exercise equipment Exercise tolerated well No report of cardiac concerns or symptoms Strength training completed today  Goals Unmet:  Not Applicable  Comments: Pt able to follow exercise prescription today without complaint.  Will continue to monitor for progression.    Dr. Emily Filbert is Medical Director for [place] and [facility].

## 2020-10-21 NOTE — Progress Notes (Signed)
Pulmonary Individual Treatment Plan  Patient Details  Name: Carrie Hendricks MRN: [id number] Date of Birth: 1950-12-22 Referring Provider:   Flowsheet Row Pulmonary Rehab from 09/03/2020 in [facility] Card[facility] and Pulmonary Rehab  Referring Provider Vernard Gambles MD      Initial Encounter Date:  Flowsheet Row Pulmonary Rehab from 09/03/2020 in [facility] Cardiac and Pulmonary Rehab  Date 09/03/20      Visit Diagnosis: Pulmonary hypertension ([place])  Patient's Home Medications on Admission:  Current Outpatient Medications:  .  albuterol (VENTOLIN HFA) 108 (90 Base) MCG/ACT inhaler, Inhale 2 puffs into the lungs every 6 (six) hours as needed for wheezing or shortness of breath., Disp: 54 g, Rfl: 1 .  alendronate (FOSAMAX) 70 MG tablet, Take 1 tablet by mouth once a week., Disp: , Rfl:  .  amLODipine (NORVASC) 5 MG tablet, TAKE 1 TABLET EVERY DAY, Disp: 90 tablet, Rfl: 0 .  Aspirin 81 MG EC tablet, Take 81 mg by mouth daily., Disp: , Rfl:  .  Budeson-Glycopyrrol-Formoterol (BREZTRI AEROSPHERE) 160-9-4.8 MCG/ACT AERO, Inhale into the lungs., Disp: , Rfl:  .  Budeson-Glycopyrrol-Formoterol (BREZTRI AEROSPHERE) 160-9-4.8 MCG/ACT AERO, Inhale 2 puffs into the lungs 2 (two) times daily., Disp: 10.7 g, Rfl: 11 .  Budeson-Glycopyrrol-Formoterol (BREZTRI AEROSPHERE) 160-9-4.8 MCG/ACT AERO, Inhale 2 puffs into the lungs in the morning and at bedtime., Disp: 5.9 g, Rfl: 0 .  etanercept (ENBREL) 50 MG/ML injection, Inject 50 mg into the skin once a week., Disp: , Rfl:  .  hydrochlorothiazide (HYDRODIURIL) 25 MG tablet, TAKE 1 TABLET EVERY DAY, Disp: 90 tablet, Rfl: 0 .  hydroxychloroquine (PLAQUENIL) 200 MG tablet, Take 200 mg by mouth daily., Disp: , Rfl:  .  mesalamine (LIALDA) 1.2 g EC tablet, TAKE 1 TABLET EVERY DAY WITH BREAKFAST, Disp: 90 tablet, Rfl: 3 .  Multiple Vitamin (MULTIVITAMIN) capsule, Take 1 capsule by mouth daily., Disp: , Rfl:   Past Medical History: Past Medical History:  Diagnosis Date   . Arthritis    rheumatoid  . Complication of anesthesia   . Hypertension   . Osteoporosis   . PONV (postoperative nausea and vomiting)   . Pulmonary hypertension ([place])   . Rheumatoid arthritis(714.0)    ? scleroderma    Tobacco Use: Social History   Tobacco Use  Smoking Status Former Smoker  . Packs/day: 1.25  . Years: 30.00  . Pack years: 37.50  . Quit date: 2006  . Years since quitting: 16.3  Smokeless Tobacco Never Used  Tobacco Comment   Quit in 8/07    Labs: Recent Review Flowsheet Data    Labs for ITP Cardiac and Pulmonary Rehab Latest Ref Rng & Units 10/15/2013 03/05/2014 05/15/2015 05/25/2016 08/14/2018   Cholestrol 0 - 200 mg/dL 178 195 177 197 186   LDLCALC 0 - 99 mg/dL 95 113(H) 93 118(H) 105(H)   HDL >39.00 mg/dL 65.50 69.80 70 64.30 63.30   Trlycerides 0.0 - 149.0 mg/dL 86.0 63.0 69 76.0 91.0       Pulmonary Assessment Scores:  Pulmonary Assessment Scores    Row Name 09/03/20 1718         ADL UCSD   ADL Phase Entry     SOB Score total 54     Rest 1     Walk 3     Stairs 5     Bath 2     Dress 3     Shop 3           CAT Score  CAT Score 27           mMRC Score   mMRC Score 2            UCSD: Self-administered rating of dyspnea associated with activities of daily living (ADLs) 6-point scale (0 = "not at all" to 5 = "maximal or unable to do because of breathlessness")  Scoring Scores range from 0 to 120.  Minimally important difference is 5 units  CAT: CAT can identify the health impairment of COPD patients and is better correlated with disease progression.  CAT has a scoring range of zero to 40. The CAT score is classified into four groups of low (less than 10), medium (10 - 20), high (21-30) and very high (31-40) based on the impact level of disease on health status. A CAT score over 10 suggests significant symptoms.  A worsening CAT score could be explained by an exacerbation, poor medication adherence, poor inhaler technique, or  progression of COPD or comorbid conditions.  CAT MCID is 2 points  mMRC: mMRC (Modified Medical Research Council) Dyspnea Scale is used to assess the degree of baseline functional disability in patients of respiratory disease due to dyspnea. No minimal important difference is established. A decrease in score of 1 point or greater is considered a positive change.   Pulmonary Function Assessment:   Exercise Target Goals: Exercise Program Goal: Individual exercise prescription set using results from initial 6 min walk test and THRR while considering  patient's activity barriers and safety.   Exercise Prescription Goal: Initial exercise prescription builds to 30-45 minutes a day of aerobic activity, 2-3 days per week.  Home exercise guidelines will be given to patient during program as part of exercise prescription that the participant will acknowledge.  Education: Aerobic Exercise: - Group verbal and visual presentation on the components of exercise prescription. Introduces F.I.T.T principle from ACSM for exercise prescriptions.  Reviews F.I.T.T. principles of aerobic exercise including progression. Written material given at graduation.   Education: Resistance Exercise: - Group verbal and visual presentation on the components of exercise prescription. Introduces F.I.T.T principle from ACSM for exercise prescriptions  Reviews F.I.T.T. principles of resistance exercise including progression. Written material given at graduation.    Education: Exercise & Equipment Safety: - Individual verbal instruction and demonstration of equipment use and safety with use of the equipment. Flowsheet Row Pulmonary Rehab from 10/14/2020 in [facility] Cardiac and Pulmonary Rehab  Education need identified 09/03/20  Date 09/03/20  Educator [place]  Instruction Review Code 1- Verbalizes Understanding      Education: Exercise Physiology & General Exercise Guidelines: - Group verbal and written instruction with models  to review the exercise physiology of the cardiovascular system and associated critical values. Provides general exercise guidelines with specific guidelines to those with heart or lung disease.  Flowsheet Row Pulmonary Rehab from 10/14/2020 in [facility] Cardiac and Pulmonary Rehab  Date 10/14/20  Educator AS  Instruction Review Code 1- Verbalizes Understanding      Education: Flexibility, Balance, Mind/Body Relaxation: - Group verbal and visual presentation with interactive activity on the components of exercise prescription. Introduces F.I.T.T principle from ACSM for exercise prescriptions. Reviews F.I.T.T. principles of flexibility and balance exercise training including progression. Also discusses the mind body connection.  Reviews various relaxation techniques to help reduce and manage stress (i.e. Deep breathing, progressive muscle relaxation, and visualization). Balance handout provided to take home. Written material given at graduation.   Activity Barriers & Risk Stratification:  Activity Barriers & Cardiac Risk Stratification -  09/03/20 1705      Activity Barriers & Cardiac Risk Stratification   Activity Barriers Arthritis;Shortness of Breath;Back Problems;Deconditioning;Other (comment)    Comments Sciatica, DDD           6 Minute Walk:  6 Minute Walk    Row Name 09/03/20 1711         6 Minute Walk   Phase Initial     Distance 400 feet     Walk Time 3 minutes     # of Rest Breaks 0     MPH 1.51     METS 1.91     RPE 13     Perceived Dyspnea  1     VO2 Peak 6.69     Symptoms No     Resting HR 77 bpm     Resting BP 126/72     Resting Oxygen Saturation  98 %     Exercise Oxygen Saturation  during 6 min walk 80 %  Stopped test     Max Ex. HR 89 bpm     Max Ex. BP 136/74     2 Minute Post BP 124/74           Interval HR   1 Minute HR 85     2 Minute HR 88     3 Minute HR 88     4 Minute HR 89     5 Minute HR --  Stopped test     6 Minute HR --  Stopped test     2  Minute Post HR 74     Interval Heart Rate? Yes           Interval Oxygen   Interval Oxygen? Yes     Baseline Oxygen Saturation % 98 %     1 Minute Oxygen Saturation % 90 %     1 Minute Liters of Oxygen 2 L     2 Minute Oxygen Saturation % 84 %     2 Minute Liters of Oxygen 2 L     3 Minute Oxygen Saturation % 81 %     3 Minute Liters of Oxygen 2 L     4 Minute Oxygen Saturation % 80 %     4 Minute Liters of Oxygen 2 L     5 Minute Oxygen Saturation % --  Stopped test     6 Minute Oxygen Saturation % --  Stopped test     2 Minute Post Oxygen Saturation % 94 %     2 Minute Post Liters of Oxygen 2 L           Oxygen Initial Assessment:  Oxygen Initial Assessment - 09/03/20 1717      Home Oxygen   Home Oxygen Device E-Tanks;Portable Concentrator;Home Concentrator    Sleep Oxygen Prescription Continuous    Liters per minute 2    Home Exercise Oxygen Prescription Continuous    Liters per minute 2    Home Resting Oxygen Prescription Continuous    Liters per minute 2    Compliance with Home Oxygen Use Yes      Initial 6 min Walk   Oxygen Used Continuous    Liters per minute 2      Program Oxygen Prescription   Program Oxygen Prescription Continuous    Liters per minute 2      Intervention   Short Term Goals To learn and understand importance of monitoring SPO2 with pulse oximeter and demonstrate accurate  use of the pulse oximeter.;To learn and demonstrate proper pursed lip breathing techniques or other breathing techniques.;To learn and understand importance of maintaining oxygen saturations>88%;To learn and demonstrate proper use of respiratory medications    Long  Term Goals Verbalizes importance of monitoring SPO2 with pulse oximeter and return demonstration;Maintenance of O2 saturations>88%;Exhibits proper breathing techniques, such as pursed lip breathing or other method taught during program session;Demonstrates proper use of MDI's;Compliance with respiratory medication            Oxygen Re-Evaluation:  Oxygen Re-Evaluation    Row Name 09/07/20 1701 10/08/20 1727           Program Oxygen Prescription   Program Oxygen Prescription Continuous Continuous;E-Tanks      Liters per minute 2 2             Home Oxygen   Home Oxygen Device E-Tanks;Portable Concentrator;Home Concentrator Home Concentrator;Portable Concentrator;E-Tanks      Sleep Oxygen Prescription Continuous Continuous      Liters per minute 2 2      Home Exercise Oxygen Prescription Continuous Continuous      Liters per minute 2 2      Home Resting Oxygen Prescription Continuous Continuous      Liters per minute 2 2      Compliance with Home Oxygen Use Yes Yes             Goals/Expected Outcomes   Short Term Goals To learn and understand importance of monitoring SPO2 with pulse oximeter and demonstrate accurate use of the pulse oximeter.;To learn and demonstrate proper pursed lip breathing techniques or other breathing techniques.;To learn and understand importance of maintaining oxygen saturations>88% To learn and understand importance of maintaining oxygen saturations>88%      Long  Term Goals Verbalizes importance of monitoring SPO2 with pulse oximeter and return demonstration;Maintenance of O2 saturations>88%;Exhibits proper breathing techniques, such as pursed lip breathing or other method taught during program session;Exhibits compliance with exercise, home and travel O2 prescription Maintenance of O2 saturations>88%      Comments Reviewed PLB technique with pt.  Talked about how it works and it's importance in maintaining their exercise saturations. She has a pulse oximeter to check her oxygen saturation at home. Informed her where to get one and explained why it is important to have one. Reviewed that oxygen saturations should be 88 percent and above. Patient has a pulse oximeter at home to check his oxygen.      Goals/Expected Outcomes Short: Become more profiecient at using PLB.    Long: Become independent at using PLB. Short: monitor oxygen at home with exertion. Long: maintain oxygen saturations above 88 percent independently.             Oxygen Discharge (Final Oxygen Re-Evaluation):  Oxygen Re-Evaluation - 10/08/20 1727      Program Oxygen Prescription   Program Oxygen Prescription Continuous;E-Tanks    Liters per minute 2      Home Oxygen   Home Oxygen Device Home Concentrator;Portable Concentrator;E-Tanks    Sleep Oxygen Prescription Continuous    Liters per minute 2    Home Exercise Oxygen Prescription Continuous    Liters per minute 2    Home Resting Oxygen Prescription Continuous    Liters per minute 2    Compliance with Home Oxygen Use Yes      Goals/Expected Outcomes   Short Term Goals To learn and understand importance of maintaining oxygen saturations>88%    Long  Term Goals Maintenance of  O2 saturations>88%    Comments She has a pulse oximeter to check her oxygen saturation at home. Informed her where to get one and explained why it is important to have one. Reviewed that oxygen saturations should be 88 percent and above. Patient has a pulse oximeter at home to check his oxygen.    Goals/Expected Outcomes Short: monitor oxygen at home with exertion. Long: maintain oxygen saturations above 88 percent independently.           Initial Exercise Prescription:  Initial Exercise Prescription - 09/03/20 1700      Date of Initial Exercise RX and Referring Provider   Date 09/03/20    Referring Provider Vernard Gambles MD      Oxygen   Oxygen Continuous    Liters 2      Treadmill   MPH 1.3    Grade 0    Minutes 15    METs 2      Recumbant Bike   Level 1    RPM 60    Watts 15    Minutes 15    METs 1.9      NuStep   Level 1    SPM 80    Minutes 15    METs 1.9      Prescription Details   Frequency (times per week) 3    Duration Progress to 30 minutes of continuous aerobic without signs/symptoms of physical distress       Intensity   THRR 40-80% of Max Heartrate 106-136    Ratings of Perceived Exertion 11-13    Perceived Dyspnea 0-4      Progression   Progression Continue to progress workloads to maintain intensity without signs/symptoms of physical distress.      Resistance Training   Training Prescription Yes    Weight Other   Using wrist weights due to severe arthritis   Reps 10-15           Perform Capillary Blood Glucose checks as needed.  Exercise Prescription Changes:  Exercise Prescription Changes    Row Name 09/03/20 1700 09/15/20 1300 09/23/20 1800 09/29/20 1600 10/13/20 1200     Response to Exercise   Blood Pressure (Admit) 126/72 136/72 -- 126/64 126/92   Blood Pressure (Exercise) 136/74 132/70 -- 138/66 126/74   Blood Pressure (Exit) 124/74 116/66 -- 122/68 102/60   Heart Rate (Admit) 77 bpm 80 bpm -- 87 bpm 80 bpm   Heart Rate (Exercise) 89 bpm 88 bpm -- 91 bpm 97 bpm   Heart Rate (Exit) 74 bpm 86 bpm -- 88 bpm 87 bpm   Oxygen Saturation (Admit) 98 % 96 % -- 96 % 94 %   Oxygen Saturation (Exercise) 80 % 93 % -- 95 % 93 %   Oxygen Saturation (Exit) 96 % 94 % -- 96 % 97 %   Rating of Perceived Exertion (Exercise) 13 14 -- 15 15   Perceived Dyspnea (Exercise) 1 -- -- 3 3   Symptoms none none -- SOB SOB   Comments walk test results -- -- -- --   Duration -- Continue with 30 min of aerobic exercise without signs/symptoms of physical distress. -- Continue with 30 min of aerobic exercise without signs/symptoms of physical distress. Continue with 30 min of aerobic exercise without signs/symptoms of physical distress.   Intensity -- THRR unchanged -- THRR unchanged THRR unchanged     Progression   Progression -- Continue to progress workloads to maintain intensity without signs/symptoms of physical distress. --  Continue to progress workloads to maintain intensity without signs/symptoms of physical distress. Continue to progress workloads to maintain intensity without signs/symptoms of  physical distress.   Average METs -- 2.57 -- 2.45 2.6     Resistance Training   Training Prescription -- Yes -- Yes Yes   Weight -- Other -- 1 lb 1 lb   Reps -- 10-15 -- 10-15 10-15     Interval Training   Interval Training -- -- -- No No     Oxygen   Oxygen -- Continuous -- -- --   Liters -- 2 -- -- --     Recumbant Bike   Level -- 1 -- 2 2   Watts -- -- -- 12 --   Minutes -- 15 -- 15 15   METs -- 3.74 -- 2.14 --     NuStep   Level -- 1 -- 2 2   SPM -- 80 -- -- --   Minutes -- 15 -- 15 15   METs -- 1.4 -- 2.2 2.3     Home Exercise Plan   Plans to continue exercise at -- -- Home (comment)  Recumbant bike, walking Home (comment)  Recumbant bike, walking Home (comment)  Recumbant bike, walking   Frequency -- -- Add 2 additional days to program exercise sessions. Add 2 additional days to program exercise sessions. Add 2 additional days to program exercise sessions.   Initial Home Exercises Provided -- -- 09/23/20 09/23/20 09/23/20          Exercise Comments:  Exercise Comments    Row Name 09/07/20 1700           Exercise Comments First full day of exercise!  Patient was oriented to gym and equipment including functions, settings, policies, and procedures.  Patient's individual exercise prescription and treatment plan were reviewed.  All starting workloads were established based on the results of the 6 minute walk test done at initial orientation visit.  The plan for exercise progression was also introduced and progression will be customized based on patient's performance and goals.              Exercise Goals and Review:  Exercise Goals    Row Name 09/03/20 1714             Exercise Goals   Increase Physical Activity Yes       Intervention Provide advice, education, support and counseling about physical activity/exercise needs.;Develop an individualized exercise prescription for aerobic and resistive training based on initial evaluation findings, risk  stratification, comorbidities and participant's personal goals.       Expected Outcomes Short Term: Attend rehab on a regular basis to increase amount of physical activity.;Long Term: Add in home exercise to make exercise part of routine and to increase amount of physical activity.;Long Term: Exercising regularly at least 3-5 days a week.       Increase Strength and Stamina Yes       Intervention Provide advice, education, support and counseling about physical activity/exercise needs.;Develop an individualized exercise prescription for aerobic and resistive training based on initial evaluation findings, risk stratification, comorbidities and participant's personal goals.       Expected Outcomes Short Term: Increase workloads from initial exercise prescription for resistance, speed, and METs.;Short Term: Perform resistance training exercises routinely during rehab and add in resistance training at home;Long Term: Improve cardiorespiratory fitness, muscular endurance and strength as measured by increased METs and functional capacity (6MWT)       Able to  understand and use rate of perceived exertion (RPE) scale Yes       Intervention Provide education and explanation on how to use RPE scale       Expected Outcomes Short Term: Able to use RPE daily in rehab to express subjective intensity level;Long Term:  Able to use RPE to guide intensity level when exercising independently       Able to understand and use Dyspnea scale Yes       Intervention Provide education and explanation on how to use Dyspnea scale       Expected Outcomes Short Term: Able to use Dyspnea scale daily in rehab to express subjective sense of shortness of breath during exertion;Long Term: Able to use Dyspnea scale to guide intensity level when exercising independently       Knowledge and understanding of Target Heart Rate Range (THRR) Yes       Intervention Provide education and explanation of THRR including how the numbers were predicted  and where they are located for reference       Expected Outcomes Short Term: Able to state/look up THRR;Short Term: Able to use daily as guideline for intensity in rehab;Long Term: Able to use THRR to govern intensity when exercising independently       Able to check pulse independently Yes       Intervention Provide education and demonstration on how to check pulse in carotid and radial arteries.;Review the importance of being able to check your own pulse for safety during independent exercise       Expected Outcomes Short Term: Able to explain why pulse checking is important during independent exercise;Long Term: Able to check pulse independently and accurately       Understanding of Exercise Prescription Yes       Intervention Provide education, explanation, and written materials on patient's individual exercise prescription       Expected Outcomes Short Term: Able to explain program exercise prescription;Long Term: Able to explain home exercise prescription to exercise independently              Exercise Goals Re-Evaluation :  Exercise Goals Re-Evaluation    Row Name 09/07/20 1700 09/15/20 1342 09/23/20 1824 09/29/20 1608 10/13/20 1217     Exercise Goal Re-Evaluation   Exercise Goals Review Able to understand and use rate of perceived exertion (RPE) scale;Knowledge and understanding of Target Heart Rate Range (THRR);Increase Physical Activity;Understanding of Exercise Prescription;Increase Strength and Stamina;Able to understand and use Dyspnea scale;Able to check pulse independently Increase Physical Activity;Increase Strength and Stamina Increase Physical Activity;Increase Strength and Stamina Increase Physical Activity;Increase Strength and Stamina;Understanding of Exercise Prescription --   Comments Reviewed RPE and dyspnea scales, THR and program prescription with pt today.  Pt voiced understanding and was given a copy of goals to take home. Lashaunda is tolerating exercise well.  She got  some  1 lb weights she can use - she cant hold regular DB due to arthritis.  We will monitor progress. Reviewed home exercise with pt today.  Pt plans to walk and use her recumbant bike for exercise.  Reviewed THR, pulse, RPE, sign and symptoms, pulse oximetery and when to call 911 or MD.  Also discussed weather considerations and indoor options.  Pt voiced understanding. Yasmine is doing well in rehab.  She holding steady with her METs and should be ready to start to increase workloads with her improved attendance.  We will continue to monitor her progress. kalaya attends consistently.  Oxygen stays  in the 90s. Staff will encourage increasing workloads on T4.   Expected Outcomes Short: Use RPE daily to regulate intensity. Long: Follow program prescription in THR. Short: attend consistently Long:  build stamina -- Short: Increase workloads Long: Continue to improve stamina. Short: Increase workloads Long: Continue to improve stamina.          Discharge Exercise Prescription (Final Exercise Prescription Changes):  Exercise Prescription Changes - 10/13/20 1200      Response to Exercise   Blood Pressure (Admit) 126/92    Blood Pressure (Exercise) 126/74    Blood Pressure (Exit) 102/60    Heart Rate (Admit) 80 bpm    Heart Rate (Exercise) 97 bpm    Heart Rate (Exit) 87 bpm    Oxygen Saturation (Admit) 94 %    Oxygen Saturation (Exercise) 93 %    Oxygen Saturation (Exit) 97 %    Rating of Perceived Exertion (Exercise) 15    Perceived Dyspnea (Exercise) 3    Symptoms SOB    Duration Continue with 30 min of aerobic exercise without signs/symptoms of physical distress.    Intensity THRR unchanged      Progression   Progression Continue to progress workloads to maintain intensity without signs/symptoms of physical distress.    Average METs 2.6      Resistance Training   Training Prescription Yes    Weight 1 lb    Reps 10-15      Interval Training   Interval Training No      Recumbant Bike    Level 2    Minutes 15      NuStep   Level 2    Minutes 15    METs 2.3      Home Exercise Plan   Plans to continue exercise at Home (comment)   Recumbant bike, walking   Frequency Add 2 additional days to program exercise sessions.    Initial Home Exercises Provided 09/23/20           Nutrition:  Target Goals: Understanding of nutrition guidelines, daily intake of sodium <1564m, cholesterol <2096m calories 30% from fat and 7% or less from saturated fats, daily to have 5 or more servings of fruits and vegetables.  Education: All About Nutrition: -Group instruction provided by verbal, written material, interactive activities, discussions, models, and posters to present general guidelines for heart healthy nutrition including fat, fiber, MyPlate, the role of sodium in heart healthy nutrition, utilization of the nutrition label, and utilization of this knowledge for meal planning. Follow up email sent as well. Written material given at graduation. Flowsheet Row [facility]  Date 09/09/20  [facility] Review Code 1- Verbalizes Understanding      Biometrics:  Pre Biometrics - 09/07/20 1708      Pre Biometrics   Single Leg Stand 30 seconds            Nutrition Therapy Plan and Nutrition Goals:  Nutrition Therapy & Goals - 09/16/20 1726      Nutrition Therapy   Diet High kcal, high protein    Protein (specify units) 55g    Fiber 20 grams    Whole Grain Foods 2 servings   eats very little   Saturated Fats 12 max. grams    Fruits and Vegetables 3 servings/day   eats very little   Sodium 1.5 grams      Personal Nutrition Goals   Nutrition Goal ST: add two nutritionally dense  drinks in between meals like soymilk and boost, add fat like butter and oil to meals she is already eating LT: meet energy and protein needs, gain weight    Comments She also has Ulcerative Colitis - on medication that is working  well for her. She reports not having trigger foods - she has trouble with higher fat foods like whole fat dairy products. No shortness of breath during or after meals. Meals about the size of fist - half chicken, potato and other vegetable. B: mini cinnamon waffles (a couple of squares at home and a couple at work before lunch) L: [employer] with [place] and mayo and pickle (half) or lowfat yogurt with orange  D: 2 oz of chicken (fish: usually tilapia, 1-2x/week), potato and other vegetable like sliced pickeled beets or healthy choice and lean cuisines due to the smaller portions they provide. S: mini carrots with ranch dip  Drinks: water - during meals. Discussed pulmonary MNT. Her weight today was 100.7 lbs.      Intervention Plan   Intervention Prescribe, educate and counsel regarding individualized specific dietary modifications aiming towards targeted core components such as weight, hypertension, lipid management, diabetes, heart failure and other comorbidities.;Nutrition handout(s) given to patient.    Expected Outcomes Short Term Goal: Understand basic principles of dietary content, such as calories, fat, sodium, cholesterol and nutrients.;Short Term Goal: A plan has been developed with personal nutrition goals set during dietitian appointment.;Long Term Goal: Adherence to prescribed nutrition plan.           Nutrition Assessments:  MEDIFICTS Score Key:  ?70 Need to make dietary changes   40-70 Heart Healthy Diet  ? 40 Therapeutic Level Cholesterol Diet  Flowsheet Row Pulmonary Rehab from 09/03/2020 in [facility] Cardiac and Pulmonary Rehab  Picture Your Plate Total Score on Admission 63     Picture Your Plate Scores:  <97 Unhealthy dietary pattern with much room for improvement.  41-50 Dietary pattern unlikely to meet recommendations for good health and room for improvement.  51-60 More healthful dietary pattern, with some room for improvement.   >60 Healthy dietary pattern,  although there may be some specific behaviors that could be improved.   Nutrition Goals Re-Evaluation:  Nutrition Goals Re-Evaluation    [place] Name 09/23/20 1806 10/08/20 1732 10/14/20 1804         Goals   Current Weight 101 lb 1.6 oz (45.9 kg) 101 lb (45.8 kg) 100 lb 12.8 oz (45.7 kg)     Nutrition Goal -- Gain weight Short: intake more protein and continue snacks. drink ensure optimally 3 times a day, at least 2. Long: gain 5 pounds.     Comment Previous weight: 100.7lb. Spoke with daughter about weight concerns. Rebakah wants to try to gain more weight. She has been doing more snaking throughout the day. She is happy that she has gained a few pounds since she started the program and wants to continue. Discussed drinking ensure optimally 3 times a day, at least 2 - she is only drinking one right now. Discussed adding fat to meals such as avocado, oil, and butter.     Expected Outcome -- Short: intake more protein and continue snacks. Long: gain 5 pounds. Short: intake more protein and continue snacks. Long: gain 5 pounds.            Nutrition Goals Discharge (Final Nutrition Goals Re-Evaluation):  Nutrition Goals Re-Evaluation - 10/14/20 1804      Goals   Current Weight 100 lb  12.8 oz (45.7 kg)    Nutrition Goal Short: intake more protein and continue snacks. drink ensure optimally 3 times a day, at least 2. Long: gain 5 pounds.    Comment Discussed drinking ensure optimally 3 times a day, at least 2 - she is only drinking one right now. Discussed adding fat to meals such as avocado, oil, and butter.    Expected Outcome Short: intake more protein and continue snacks. Long: gain 5 pounds.           Psychosocial: Target Goals: Acknowledge presence or absence of significant depression and/or stress, maximize coping skills, provide positive support system. Participant is able to verbalize types and ability to use techniques and skills needed for reducing stress and depression.    Education: Stress, Anxiety, and Depression - Group verbal and visual presentation to define topics covered.  Reviews how body is impacted by stress, anxiety, and depression.  Also discusses healthy ways to reduce stress and to treat/manage anxiety and depression.  Written material given at graduation. Flowsheet Row Pulmonary Rehab from 10/14/2020 in [facility] Cardiac and Pulmonary Rehab  Date 10/07/20  Educator [facility]  Instruction Review Code 1- [employer] Understanding      Education: Sleep Hygiene -Provides group verbal and written instruction about how sleep can affect your health.  Define sleep hygiene, discuss sleep cycles and impact of sleep habits. Review good sleep hygiene tips.    Initial Review & Psychosocial Screening:  Initial Psych Review & Screening - 08/31/20 1536      Initial Review   Current issues with Current Stress Concerns;Current Sleep Concerns    Source of Stress Concerns Unable to participate in former interests or hobbies;Unable to perform yard/household activities;Chronic Illness      Family Dynamics   Good Support System? Yes   family     Barriers   Psychosocial barriers to participate in program There are no identifiable barriers or psychosocial needs.;The patient should benefit from training in stress management and relaxation.      Screening Interventions   Interventions Encouraged to exercise;Provide feedback about the scores to participant;To provide support and resources with identified psychosocial needs    Expected Outcomes Short Term goal: Utilizing psychosocial counselor, staff and physician to assist with identification of specific Stressors or current issues interfering with healing process. Setting desired goal for each stressor or current issue identified.;Long Term Goal: Stressors or current issues are controlled or eliminated.;Short Term goal: Identification and review with participant of any Quality of Life or Depression concerns found by scoring the  questionnaire.;Long Term goal: The participant improves quality of Life and PHQ9 Scores as seen by post scores and/or verbalization of changes           Quality of Life Scores:  Scores of 19 and below usually indicate a poorer quality of life in these areas.  A difference of  2-3 points is a clinically meaningful difference.  A difference of 2-3 points in the total score of the Quality of Life Index has been associated with significant improvement in overall quality of life, self-image, physical symptoms, and general health in studies assessing change in quality of life.  PHQ-9: Recent Review Flowsheet Data    Depression screen [facility] 2/9 09/30/2020 09/03/2020 01/03/2020 08/14/2018   Decreased Interest 0 0 2 0   Down, Depressed, Hopeless 0 3  1 0   PHQ - 2 Score 0 3 3 0   Altered sleeping 1 2 2  0   Tired, decreased energy 1 3  2 0   Change in appetite 1 2  2  0   Feeling bad or failure about yourself  0 2 0 0   Trouble concentrating 0 1 0 0   Moving slowly or fidgety/restless 0 0 0 0   Suicidal thoughts 0 0 0 0   PHQ-9 Score 3 13 9  0   Difficult doing work/chores Not difficult at all Somewhat difficult Very difficult Not difficult at all     Interpretation of Total Score  Total Score Depression Severity:  1-4 = Minimal depression, 5-9 = Mild depression, 10-14 = Moderate depression, 15-19 = Moderately severe depression, 20-27 = Severe depression   Psychosocial Evaluation and Intervention:  Psychosocial Evaluation - 08/31/20 1547      Psychosocial Evaluation & Interventions   Interventions Stress management education;Relaxation education;Encouraged to exercise with the program and follow exercise prescription    Comments Ms. Miyoshi states she is very excited to start Pulmonary Rehab to help with her shortness of breath. She has also lost weight unintentionally and is looking forward to speaking with the dietician. She is currently working and expresses some stress related to that as well as  stress concerning her chronic breathing issues. She does have a good support system that she cherishes. She has had to increase her oxygen use and is trialing different inhalers in hopes that will help with her constant feeling short of breath. She wants to impore her quality of life and gain some stamina.    Expected Outcomes Short: attend Pulmonary Rehab for education and exercise. Long: develop and maintain positive self care habits.    Continue Psychosocial Services  Follow up required by staff           Psychosocial Re-Evaluation:  Psychosocial Re-Evaluation    [place] Name 10/08/20 1733             Psychosocial Re-Evaluation   Current issues with Current Stress Concerns;Current Sleep Concerns       Comments Rayette wears oxygen at night and has got used to it. She is able to sleep better now that she is used to her oxygen. She is not as anxious now that she is able to breath a little better.       Expected Outcomes Short: Continue to exercise regularly to support mental health and notify staff of any changes. Long: maintain mental health and well being through teaching of rehab or prescribed medications independently.       Interventions Encouraged to attend Pulmonary Rehabilitation for the exercise       Continue Psychosocial Services  Follow up required by staff              Psychosocial Discharge (Final Psychosocial Re-Evaluation):  Psychosocial Re-Evaluation - 10/08/20 1733      Psychosocial Re-Evaluation   Current issues with Current Stress Concerns;Current Sleep Concerns    Comments Ritisha wears oxygen at night and has got used to it. She is able to sleep better now that she is used to her oxygen. She is not as anxious now that she is able to breath a little better.    Expected Outcomes Short: Continue to exercise regularly to support mental health and notify staff of any changes. Long: maintain mental health and well being through teaching of rehab or prescribed medications  independently.    Interventions Encouraged to attend Pulmonary Rehabilitation for the exercise    Continue Psychosocial Services  Follow up required by staff  Education: Education Goals: Education classes will be provided on a weekly basis, covering required topics. Participant will state understanding/return demonstration of topics presented.  Learning Barriers/Preferences:  Learning Barriers/Preferences - 08/31/20 1532      Learning Barriers/Preferences   Learning Barriers None    Learning Preferences None           General Pulmonary Education Topics:  Infection Prevention: - Provides verbal and written material to individual with discussion of infection control including proper hand washing and proper equipment cleaning during exercise session. Flowsheet Row Pulmonary Rehab from 10/14/2020 in [facility] Cardiac and Pulmonary Rehab  Education need identified 09/03/20  Date 09/03/20  Educator [place]  Instruction Review Code 1- Verbalizes Understanding      Falls Prevention: - Provides verbal and written material to individual with discussion of falls prevention and safety. Flowsheet Row Pulmonary Rehab from 10/14/2020 in [facility] Cardiac and Pulmonary Rehab  Education need identified 09/03/20  Date 09/03/20  Educator [place]  Instruction Review Code 1- Verbalizes Understanding      Chronic Lung Disease Review: - Group verbal instruction with posters, models, PowerPoint presentations and videos,  to review new updates, new respiratory medications, new advancements in procedures and treatments. Providing information on websites and "800" numbers for continued self-education. Includes information about supplement oxygen, available portable oxygen systems, continuous and intermittent flow rates, oxygen safety, concentrators, and Medicare reimbursement for oxygen. Explanation of Pulmonary Drugs, including class, frequency, complications, importance of spacers, rinsing mouth after steroid  MDI's, and proper cleaning methods for nebulizers. Review of basic lung anatomy and physiology related to function, structure, and complications of lung disease. Review of risk factors. Discussion about methods for diagnosing sleep apnea and types of masks and machines for OSA. Includes a review of the use of types of environmental controls: home humidity, furnaces, filters, dust mite/pet prevention, HEPA vacuums. Discussion about weather changes, air quality and the benefits of nasal washing. Instruction on Warning signs, infection symptoms, calling MD promptly, preventive modes, and value of vaccinations. Review of effective airway clearance, coughing and/or vibration techniques. Emphasizing that all should Create an Action Plan. Written material given at graduation. Flowsheet Row Pulmonary Rehab from 10/14/2020 in [facility] Cardiac and Pulmonary Rehab  Date 09/30/20  Educator [facility]  Instruction Review Code 1- Verbalizes Understanding      AED/CPR: - Group verbal and written instruction with the use of models to demonstrate the basic use of the AED with the basic ABC's of resuscitation.    Anatomy and Cardiac Procedures: - Group verbal and visual presentation and models provide information about basic cardiac anatomy and function. Reviews the testing methods done to diagnose heart disease and the outcomes of the test results. Describes the treatment choices: Medical Management, Angioplasty, or Coronary Bypass Surgery for treating various heart conditions including Myocardial Infarction, Angina, Valve Disease, and Cardiac Arrhythmias.  Written material given at graduation.   Medication Safety: - Group verbal and visual instruction to review commonly prescribed medications for heart and lung disease. Reviews the medication, class of the drug, and side effects. Includes the steps to properly store meds and maintain the prescription regimen.  Written material given at graduation. Flowsheet Row Pulmonary  Rehab from 10/14/2020 in [facility] Cardiac and Pulmonary Rehab  Date 09/16/20  Educator [facility]  Instruction Review Code 1- Verbalizes Understanding      Other: -Provides group and verbal instruction on various topics (see comments)   Knowledge Questionnaire Score:  Knowledge Questionnaire Score - 09/03/20 1715      Knowledge  Questionnaire Score   Pre Score 16/18: Nutrition, medications            Core Components/Risk Factors/Patient Goals at Admission:  Personal Goals and Risk Factors at Admission - 09/03/20 1714      Core Components/Risk Factors/Patient Goals on Admission    Weight Management Yes;Weight Gain    Intervention Weight Management: Provide education and appropriate resources to help participant work on and attain dietary goals.;Weight Management: Develop a combined nutrition and exercise program designed to reach desired caloric intake, while maintaining appropriate intake of nutrient and fiber, sodium and fats, and appropriate energy expenditure required for the weight goal.    Admit Weight 100 lb 8 oz (45.6 kg)    Goal Weight: Short Term 105 lb (47.6 kg)    Goal Weight: Long Term 110 lb (49.9 kg)    Expected Outcomes Short Term: Continue to assess and modify interventions until short term weight is achieved;Long Term: Adherence to nutrition and physical activity/exercise program aimed toward attainment of established weight goal;Weight Gain: Understanding of general recommendations for a high calorie, high protein meal plan that promotes weight gain by distributing calorie intake throughout the day with the consumption for 4-5 meals, snacks, and/or supplements;Understanding of distribution of calorie intake throughout the day with the consumption of 4-5 meals/snacks;Understanding recommendations for meals to include 15-35% energy as protein, 25-35% energy from fat, 35-60% energy from carbohydrates, less than 290m of dietary cholesterol, 20-35 gm of total fiber daily    Improve  shortness of breath with ADL's Yes    Intervention Provide education, individualized exercise plan and daily activity instruction to help decrease symptoms of SOB with activities of daily living.    Expected Outcomes Short Term: Improve cardiorespiratory fitness to achieve a reduction of symptoms when performing ADLs;Long Term: Be able to perform more ADLs without symptoms or delay the onset of symptoms    Hypertension Yes    Intervention Monitor prescription use compliance.;Provide education on lifestyle modifcations including regular physical activity/exercise, weight management, moderate sodium restriction and increased consumption of fresh fruit, vegetables, and low fat dairy, alcohol moderation, and smoking cessation.    Expected Outcomes Short Term: Continued assessment and intervention until BP is < 140/913mHG in hypertensive participants. < 130/8093mG in hypertensive participants with diabetes, heart failure or chronic kidney disease.;Long Term: Maintenance of blood pressure at goal levels.           Education:Diabetes - Individual verbal and written instruction to review signs/symptoms of diabetes, desired ranges of glucose level fasting, after meals and with exercise. Acknowledge that pre and post exercise glucose checks will be done for 3 sessions at entry of program.   Know Your Numbers and Heart Failure: - Group verbal and visual instruction to discuss disease risk factors for cardiac and pulmonary disease and treatment options.  Reviews associated critical values for Overweight/Obesity, Hypertension, Cholesterol, and Diabetes.  Discusses basics of heart failure: signs/symptoms and treatments.  Introduces Heart Failure Zone chart for action plan for heart failure.  Written material given at graduation. Flowsheet Row [facility]  Date 09/23/20  Educ[facility] 1- Verbalizes Understanding      Core  Components/Risk Factors/Patient Goals Review:   Goals and Risk Factor Review    Row Name 10/08/20 1731             Core Components/Risk Factors/Patient Goals Review   Personal Goals Review Improve shortness of breath with ADL's  Review Spoke to patient about their shortness of breath and what they can do to improve. Patient has been informed of breathing techniques when starting the program. Patient is informed to tell staff if they have had any med changes and that certain meds they are taking or not taking can be causing shortness of breath.       Expected Outcomes Short: Attend [facility] regularly to improve shortness of breath with ADL's. Long: maintain independence with ADL's              Core Components/Risk Factors/Patient Goals at Discharge (Final Review):   Goals and Risk Factor Review - 10/08/20 1731      Core Components/Risk Factors/Patient Goals Review   Personal Goals Review Improve shortness of breath with ADL's    Review Spoke to patient about their shortness of breath and what they can do to improve. Patient has been informed of breathing techniques when starting the program. Patient is informed to tell staff if they have had any med changes and that certain meds they are taking or not taking can be causing shortness of breath.    Expected Outcomes Short: Attend [facility] regularly to improve shortness of breath with ADL's. Long: maintain independence with ADL's           ITP Comments:  ITP Comments    Row Name 08/31/20 1544 09/03/20 1701 09/07/20 1659 09/23/20 0753 10/21/20 1006   ITP Comments Initial telephone orientation completed. Diagnosis can be found in [facility] on 1/13. EP orientation scheduled for Thursday 3/3 at 3pm. Completed 6MWT and gym orientation. Initial ITP created and sent for review to Dr. Emily Filbert, Medical Director. First full day of exercise!  Patient was oriented to gym and equipment including functions, settings, policies, and procedures.   Patient's individual exercise prescription and treatment plan were reviewed.  All starting workloads were established based on the results of the 6 minute walk test done at initial orientation visit.  The plan for exercise progression was also introduced and progression will be customized based on patient's performance and goals. 30 Day review completed. Medical Director ITP review done, changes made as directed, and signed approval by Medical Director.  New to program 30 Day review completed. Medical Director ITP review done, changes made as directed, and signed approval by Medical Director.          Comments:

## 2020-10-22 ENCOUNTER — Encounter: Payer: Medicare HMO | Admitting: *Deleted

## 2020-10-22 DIAGNOSIS — I272 Pulmonary hypertension, unspecified: Secondary | ICD-10-CM

## 2020-10-22 DIAGNOSIS — J449 Chronic obstructive pulmonary disease, unspecified: Secondary | ICD-10-CM | POA: Diagnosis not present

## 2020-10-22 NOTE — Progress Notes (Signed)
Daily Session Note  Patient Details  Name: Carrie Hendricks MRN: [id number] Date of Birth: Jun 24, 1951 Referring Provider:   Flowsheet Row Pulmonary Rehab from 09/03/2020 in [facility] Cardiac and Pulmonary Rehab  Referring Provider Vernard Gambles MD      Encounter Date: 10/22/2020  Check In:  Session Check In - 10/22/20 1705      Check-In   Supervising physician immediately available to respond to emergencies See telemetry face sheet for immediately available ER MD    Location [facility]-Cardiac & Pulmonary Rehab    Staff Present Renita Papa, RN BSN;Joseph Lou Miner, [place] Exercise Physiologist    Virtual Visit No    Medication changes reported     No    Fall or balance concerns reported    No    Warm-up and Cool-down Performed on first and last piece of equipment    Resistance Training Performed Yes    VAD Patient? No    PAD/SET Patient? No      Pain Assessment   Currently in Pain? No/denies              Social History   Tobacco Use  Smoking Status Former Smoker  . Packs/day: 1.25  . Years: 30.00  . Pack years: 37.50  . Quit date: 2006  . Years since quitting: 16.3  Smokeless Tobacco Never Used  Tobacco Comment   Quit in 8/07    Goals Met:  Independence with exercise equipment Exercise tolerated well No report of cardiac concerns or symptoms Strength training completed today  Goals Unmet:  Not Applicable  Comments: Pt able to follow exercise prescription today without complaint.  Will continue to monitor for progression.    Dr. Emily Filbert is Medical Director for [place] and [facility] Rehabilitation.

## 2020-10-26 ENCOUNTER — Encounter: Payer: Medicare HMO | Admitting: *Deleted

## 2020-10-26 ENCOUNTER — Other Ambulatory Visit: Payer: Self-pay

## 2020-10-26 DIAGNOSIS — J449 Chronic obstructive pulmonary disease, unspecified: Secondary | ICD-10-CM

## 2020-10-26 DIAGNOSIS — I272 Pulmonary hypertension, unspecified: Secondary | ICD-10-CM

## 2020-10-26 NOTE — Progress Notes (Signed)
Daily Session Note  Patient Details  Name: Carrie Hendricks MRN: [id number] Date of Birth: [birth year] Referring Provider:   Flowsheet Row Pulmonary Rehab from 09/03/2020 in [facility] Cardiac and Pulmonary Rehab  Referring Provider Vernard Gambles MD      Encounter Date: 10/26/2020  Check In:  Session Check In - 10/26/20 1710      Check-In   Supervising physician immediately available to respond to emergencies See telemetry face sheet for immediately available ER MD    Location [facility]-Cardiac & Pulmonary Rehab    Staff Present Renita Papa, RN Moises Blood, BS, ACSM CEP, Exercise Physiologist;Kara Eliezer Bottom, MS Exercise Physiologist    Virtual Visit No    Medication changes reported     No    Fall or balance concerns reported    No    Warm-up and Cool-down Performed on first and last piece of equipment    Resistance Training Performed Yes    VAD Patient? No    PAD/SET Patient? No      Pain Assessment   Currently in Pain? No/denies              Social History   Tobacco Use  Smoking Status Former Smoker  . Packs/day: 1.25  . Years: 30.00  . Pack years: 37.50  . Quit date: 2006  . Years since quitting: 16.3  Smokeless Tobacco Never Used  Tobacco Comment   Quit in 8/07    Goals Met:  Independence with exercise equipment Exercise tolerated well No report of cardiac concerns or symptoms Strength training completed today  Goals Unmet:  Not Applicable  Comments: Pt able to follow exercise prescription today without complaint.  Will continue to monitor for progression.    Dr. Emily Filbert is Medical Director for [facility] and [facility].

## 2020-10-28 ENCOUNTER — Other Ambulatory Visit: Payer: Self-pay

## 2020-10-28 DIAGNOSIS — J449 Chronic obstructive pulmonary disease, unspecified: Secondary | ICD-10-CM | POA: Diagnosis not present

## 2020-10-28 DIAGNOSIS — I272 Pulmonary hypertension, unspecified: Secondary | ICD-10-CM

## 2020-10-28 NOTE — Progress Notes (Signed)
Daily Session Note  Patient Details  Name: Carrie Hendricks MRN: [id number] Date of Birth: 1950/07/09 Referring Provider:   Flowsheet Row Pulmonary Rehab from 09/03/2020 in [facility] Cardiac and Pulmonary Rehab  Referring Provider Vernard Gambles MD      Encounter Date: 10/28/2020  Check In:  Session Check In - 10/28/20 1707      Check-In   Supervising physician immediately available to respond to emergencies See telemetry face sheet for immediately available ER MD    Location [facility]-Cardiac & Pulmonary Rehab    Staff Present Birdie Sons, MPA, RN;Melissa Caiola RDN, LDN;Joseph Lou Miner, MS Exercise Physiologist    Virtual Visit No    Medication changes reported     No    Fall or balance concerns reported    No    Warm-up and Cool-down Performed on first and last piece of equipment    Resistance Training Performed Yes    VAD Patient? No    PAD/SET Patient? No      Pain Assessment   Currently in Pain? No/denies              Social History   Tobacco Use  Smoking Status Former Smoker  . Packs/day: 1.25  . Years: 30.00  . Pack years: 37.50  . Quit date: 2006  . Years since quitting: 16.3  Smokeless Tobacco Never Used  Tobacco Comment   Quit in 8/07    Goals Met:  Independence with exercise equipment Exercise tolerated well No report of cardiac concerns or symptoms Strength training completed today  Goals Unmet:  Not Applicable  Comments: Pt able to follow exercise prescription today without complaint.  Will continue to monitor for progression.    Dr. Emily Filbert is Medical Director for [place] and [facility].

## 2020-10-29 ENCOUNTER — Encounter: Payer: Medicare HMO | Admitting: *Deleted

## 2020-10-29 ENCOUNTER — Telehealth: Payer: Self-pay | Admitting: Family Medicine

## 2020-10-29 DIAGNOSIS — J449 Chronic obstructive pulmonary disease, unspecified: Secondary | ICD-10-CM

## 2020-10-29 DIAGNOSIS — I272 Pulmonary hypertension, unspecified: Secondary | ICD-10-CM | POA: Diagnosis not present

## 2020-10-29 DIAGNOSIS — M810 Age-related osteoporosis without current pathological fracture: Secondary | ICD-10-CM

## 2020-10-29 DIAGNOSIS — I10 Essential (primary) hypertension: Secondary | ICD-10-CM

## 2020-10-29 NOTE — Progress Notes (Signed)
Daily Session Note  Patient Details  Name: Carrie Hendricks MRN: [id number] Date of Birth: 10/18/1950 Referring Provider:   Flowsheet Row Pulmonary Rehab from 09/03/2020 in [facility] Cardiac and Pulmonary Rehab  Referring Provider Vernard Gambles MD      Encounter Date: 10/29/2020  Check In:  Session Check In - 10/29/20 1708      Check-In   Supervising physician immediately available to respond to emergencies See telemetry face sheet for immediately available ER MD    Location [facility]-Cardiac & Pulmonary Rehab    Staff Present Justin Mend RCP,RRT,BSRT;Aury Scollard Sherryll Burger, RN Vickki Hearing, BA, ACSM CEP, Exercise Physiologist    Virtual Visit No    Medication changes reported     No    Fall or balance concerns reported    No    Warm-up and Cool-down Performed on first and last piece of equipment    Resistance Training Performed Yes    VAD Patient? No    PAD/SET Patient? No      Pain Assessment   Currently in Pain? No/denies              Social History   Tobacco Use  Smoking Status Former Smoker  . Packs/day: 1.25  . Years: 30.00  . Pack years: 37.50  . Quit date: 2006  . Years since quitting: 16.3  Smokeless Tobacco Never Used  Tobacco Comment   Quit in 8/07    Goals Met:  Independence with exercise equipment Exercise tolerated well No report of cardiac concerns or symptoms Strength training completed today  Goals Unmet:  Not Applicable  Comments: Pt able to follow exercise prescription today without complaint.  Will continue to monitor for progression.    Dr. Emily Filbert is Medical Director for [place] and [facility].

## 2020-10-29 NOTE — Telephone Encounter (Signed)
-----   Message from Cloyd Stagers, RT sent at 10/13/2020  1:39 PM EDT ----- Regarding: Lab Orders for Friday 4.29.2022 Please place lab orders for Friday 4.29.2022, office visit for physical on Wednesday 5.4.2022 Thank you, Dyke Maes RT(R)

## 2020-10-30 ENCOUNTER — Other Ambulatory Visit: Payer: Self-pay

## 2020-10-30 ENCOUNTER — Other Ambulatory Visit (INDEPENDENT_AMBULATORY_CARE_PROVIDER_SITE_OTHER): Payer: Medicare HMO

## 2020-10-30 ENCOUNTER — Telehealth: Payer: Self-pay | Admitting: Family Medicine

## 2020-10-30 DIAGNOSIS — Z20822 Contact with and (suspected) exposure to covid-19: Secondary | ICD-10-CM | POA: Diagnosis not present

## 2020-10-30 DIAGNOSIS — M810 Age-related osteoporosis without current pathological fracture: Secondary | ICD-10-CM

## 2020-10-30 DIAGNOSIS — I10 Essential (primary) hypertension: Secondary | ICD-10-CM | POA: Diagnosis not present

## 2020-10-30 LAB — LIPID PANEL
Cholesterol: 156 mg/dL (ref 0–200)
HDL: 69.1 mg/dL (ref >39.00)
LDL Cholesterol: 78 mg/dL (ref 0–99)
NonHDL: 86.97
Total CHOL/HDL Ratio: 2
Triglycerides: 45 mg/dL (ref 0.0–149.0)
VLDL: 9 mg/dL (ref 0.0–40.0)

## 2020-10-30 LAB — COMPREHENSIVE METABOLIC PANEL
ALT: 30 U/L (ref 0–35)
AST: 30 U/L (ref 0–37)
Albumin: 4 g/dL (ref 3.5–5.2)
Alkaline Phosphatase: 43 U/L (ref 39–117)
BUN: 10 mg/dL (ref 6–23)
CO2: 32 mEq/L (ref 19–32)
Calcium: 9.4 mg/dL (ref 8.4–10.5)
Chloride: 100 mEq/L (ref 96–112)
Creatinine, Ser: 0.68 mg/dL (ref 0.40–1.20)
GFR: 88.54 mL/min (ref >60.00)
Glucose, Bld: 101 mg/dL — ABNORMAL HIGH (ref 70–99)
Potassium: 3.6 mEq/L (ref 3.5–5.1)
Sodium: 139 mEq/L (ref 135–145)
Total Bilirubin: 0.8 mg/dL (ref 0.2–1.2)
Total Protein: 6.4 g/dL (ref 6.0–8.3)

## 2020-10-30 LAB — CBC WITH DIFFERENTIAL/PLATELET
Basophils Absolute: 0 10*3/uL (ref 0.0–0.1)
Basophils Relative: 1.2 % (ref 0.0–3.0)
Eosinophils Absolute: 0.1 10*3/uL (ref 0.0–0.7)
Eosinophils Relative: 3.1 % (ref 0.0–5.0)
HCT: 41.8 % (ref 36.0–46.0)
Hemoglobin: 14.5 g/dL (ref 12.0–15.0)
Lymphocytes Relative: 34.1 % (ref 12.0–46.0)
Lymphs Abs: 1 10*3/uL (ref 0.7–4.0)
MCHC: 34.6 g/dL (ref 30.0–36.0)
MCV: 97.6 fl (ref 78.0–100.0)
Monocytes Absolute: 0.3 10*3/uL (ref 0.1–1.0)
Monocytes Relative: 11.1 % (ref 3.0–12.0)
Neutro Abs: 1.5 10*3/uL (ref 1.4–7.7)
Neutrophils Relative %: 50.5 % (ref 43.0–77.0)
Platelets: 155 10*3/uL (ref 150.0–400.0)
RBC: 4.28 Mil/uL (ref 3.87–5.11)
RDW: 12.3 % (ref 11.5–15.5)
WBC: 3 10*3/uL — ABNORMAL LOW (ref 4.0–10.5)

## 2020-10-30 LAB — TSH: TSH: 1.74 u[IU]/mL (ref 0.35–4.50)

## 2020-10-30 LAB — VITAMIN D 25 HYDROXY (VIT D DEFICIENCY, FRACTURES): VITD: 41.85 ng/mL (ref 30.00–100.00)

## 2020-10-30 NOTE — Telephone Encounter (Signed)
Spoke to pt and relayed Dr. Alba Cory message. Pt went ahead and got tested just to be safe.

## 2020-10-30 NOTE — Telephone Encounter (Signed)
Pt called in and stated that her manager tested positive for covid Tuesday and she was not close to him.  Talked to Mrs. Larene Beach and stated that it was okay and wanted to give [facility]

## 2020-10-30 NOTE — Telephone Encounter (Signed)
Of course watch for symptoms but if not close I'm not too concerned.  Keep masking

## 2020-11-02 ENCOUNTER — Other Ambulatory Visit: Payer: Self-pay

## 2020-11-02 ENCOUNTER — Encounter: Payer: Medicare HMO | Attending: Pulmonary Disease | Admitting: *Deleted

## 2020-11-02 DIAGNOSIS — J449 Chronic obstructive pulmonary disease, unspecified: Secondary | ICD-10-CM | POA: Diagnosis not present

## 2020-11-02 DIAGNOSIS — I272 Pulmonary hypertension, unspecified: Secondary | ICD-10-CM | POA: Diagnosis not present

## 2020-11-02 NOTE — Progress Notes (Signed)
Daily Session Note  Patient Details  Name: Carrie Hendricks MRN: [id number] Date of Birth: May 17, 1951 Referring Provider:   Flowsheet Row Pulmonary Rehab from 09/03/2020 in [facility] Cardiac and Pulmonary Rehab  Referring Provider Vernard Gambles MD      Encounter Date: 11/02/2020  Check In:  Session Check In - 11/02/20 1715      Check-In   Supervising physician immediately available to respond to emergencies See telemetry face sheet for immediately available ER MD    Location [facility]-Cardiac & Pulmonary Rehab    Staff Present Renita Papa, RN Moises Blood, BS, ACSM CEP, Exercise Physiologist;Kara Eliezer Bottom, MS Exercise Physiologist    Virtual Visit No    Medication changes reported     No    Fall or balance concerns reported    No    Warm-up and Cool-down Performed on first and last piece of equipment    Resistance Training Performed Yes    VAD Patient? No    PAD/SET Patient? No      Pain Assessment   Currently in Pain? No/denies              Social History   Tobacco Use  Smoking Status Former Smoker  . Packs/day: 1.25  . Years: 30.00  . Pack years: 37.50  . Quit date: 2006  . Years since quitting: 16.3  Smokeless Tobacco Never Used  Tobacco Comment   Quit in 8/07    Goals Met:  Independence with exercise equipment Exercise tolerated well No report of cardiac concerns or symptoms Strength training completed today  Goals Unmet:  Not Applicable  Comments: Pt able to follow exercise prescription today without complaint.  Will continue to monitor for progression.    Dr. Emily Filbert is Medical Director for [place] and [facility].

## 2020-11-04 ENCOUNTER — Other Ambulatory Visit: Payer: Self-pay

## 2020-11-04 ENCOUNTER — Ambulatory Visit (INDEPENDENT_AMBULATORY_CARE_PROVIDER_SITE_OTHER): Payer: Medicare HMO | Admitting: Family Medicine

## 2020-11-04 ENCOUNTER — Encounter: Payer: Self-pay | Admitting: Family Medicine

## 2020-11-04 VITALS — BP 124/66 | HR 81 | Temp 96.9°F | Ht 64.25 in | Wt 100.2 lb

## 2020-11-04 DIAGNOSIS — M810 Age-related osteoporosis without current pathological fracture: Secondary | ICD-10-CM

## 2020-11-04 DIAGNOSIS — M069 Rheumatoid arthritis, unspecified: Secondary | ICD-10-CM | POA: Diagnosis not present

## 2020-11-04 DIAGNOSIS — Z0000 Encounter for general adult medical examination without abnormal findings: Secondary | ICD-10-CM

## 2020-11-04 DIAGNOSIS — I272 Pulmonary hypertension, unspecified: Secondary | ICD-10-CM

## 2020-11-04 DIAGNOSIS — K5190 Ulcerative colitis, unspecified, without complications: Secondary | ICD-10-CM

## 2020-11-04 DIAGNOSIS — I10 Essential (primary) hypertension: Secondary | ICD-10-CM

## 2020-11-04 DIAGNOSIS — J449 Chronic obstructive pulmonary disease, unspecified: Secondary | ICD-10-CM

## 2020-11-04 DIAGNOSIS — R600 Localized edema: Secondary | ICD-10-CM | POA: Diagnosis not present

## 2020-11-04 DIAGNOSIS — R636 Underweight: Secondary | ICD-10-CM

## 2020-11-04 DIAGNOSIS — D582 Other hemoglobinopathies: Secondary | ICD-10-CM

## 2020-11-04 MED ORDER — HYDROCHLOROTHIAZIDE 25 MG PO TABS: 1.0000 | ORAL_TABLET | Freq: Every day | ORAL | 3 refills | Status: AC

## 2020-11-04 MED ORDER — AMLODIPINE BESYLATE 5 MG PO TABS: 1.0000 | ORAL_TABLET | Freq: Every day | ORAL | 3 refills | Status: DC

## 2020-11-04 NOTE — Patient Instructions (Addendum)
Talk to Dr Jefm Bryant about a 2nd booster for covid  Please work on advance directive with the blue booklet  Get it notarized and we can scan a copy  No change in medicines Labs are stable   Take good care of yourself

## 2020-11-04 NOTE — Assessment & Plan Note (Signed)
dexa 2018 Followed by rheumatology  D is tx On alendronate No falls or fractures Better exercise lately

## 2020-11-04 NOTE — Assessment & Plan Note (Signed)
bp in fair control at this time  BP Readings from Last 1 Encounters:  11/04/20 124/66   No changes needed Most recent labs reviewed  Disc lifstyle change with low sodium diet and exercise  Plan to continue amlodipine 5 mg daily and hctz 25 mg daily

## 2020-11-04 NOTE — Assessment & Plan Note (Signed)
Continues mesalamine  Fairly stable  Colonoscopy utd Under care of GI

## 2020-11-04 NOTE — Progress Notes (Signed)
Daily Session Note  Patient Details  Name: Carrie Hendricks MRN: [id number] Date of Birth: 1951/04/14 Referring Provider:   Flowsheet Row Pulmonary Rehab from 09/03/2020 in [facility] Cardiac and Pulmonary Rehab  Referring Provider Vernard Gambles MD      Encounter Date: 11/04/2020  Check In:  Session Check In - 11/04/20 1736      Check-In   Supervising physician immediately available to respond to emergencies See telemetry face sheet for immediately available ER MD    Location [facility]-Cardiac & Pulmonary Rehab    Staff Present Birdie Sons, MPA, RN;Joseph Lou Miner, MS Exercise Physiologist;Melissa Caiola RDN, LDN    Virtual Visit No    Medication changes reported     No    Fall or balance concerns reported    No    Warm-up and Cool-down Performed on first and last piece of equipment    Resistance Training Performed Yes    VAD Patient? No    PAD/SET Patient? No      Pain Assessment   Currently in Pain? No/denies              Social History   Tobacco Use  Smoking Status Former Smoker  . Packs/day: 1.25  . Years: 30.00  . Pack years: 37.50  . Quit date: 2006  . Years since quitting: 16.3  Smokeless Tobacco Never Used  Tobacco Comment   Quit in 8/07    Goals Met:  Independence with exercise equipment Exercise tolerated well No report of cardiac concerns or symptoms Strength training completed today  Goals Unmet:  Not Applicable  Comments: Pt able to follow exercise prescription today without complaint.  Will continue to monitor for progression.    Dr. Emily Filbert is Medical Director for [place] and [facility].

## 2020-11-04 NOTE — Assessment & Plan Note (Signed)
Continues enbrel and plaquenil per rheumatology

## 2020-11-04 NOTE — Progress Notes (Signed)
Subjective:    Patient ID: Carrie Hendricks, female    DOB: [birth year], 70 y.o.   MRN: [id number]  This visit occurred during the SARS-CoV-2 public health emergency.  Safety protocols were in place, including screening questions prior to the visit, additional usage of staff PPE, and extensive cleaning of exam room while observing appropriate contact time as indicated for disinfecting solutions.    HPI Pt presents for amw and health mt exam   I have personally reviewed the Medicare Annual Wellness questionnaire and have noted 1. The patient's medical and social history 2. Their use of alcohol, tobacco or illicit drugs 3. Their current medications and supplements 4. The patient's functional ability including ADL's, fall risks, home safety risks and hearing or visual             impairment. 5. Diet and physical activities 6. Evidence for depression or mood disorders  The patients weight, height, BMI have been recorded in the chart and visual acuity is per eye clinic.  I have made referrals, counseling and provided education to the patient based review of the above and I have provided the pt with a written personalized care plan for preventive services. Reviewed and updated provider list, see scanned forms.  See scanned forms.  Routine anticipatory guidance given to patient.  See health maintenance. Colon cancer screening 6/19 colonoscopy with 5 y recall  In setting of treated UC with Lial[facility] This has been stable under GI care  Breast cancer screening  Mammogram 8/21  Self breast exam- no lumps or changes  Flu vaccine 10/21 covid imm with booster, planning 2nd booster due to RA Tetanus vaccine  12/11 Pneumovax utd Zoster vaccine- Dr Jefm Bryant said she cannot get that/ on rheum med  Dexa 1/18   OP - watched by rhuem Taking alendronate-tolerates well  Falls none Fractures-none Supplements-taking mvi and D level is fine (was high previously)  Exercise - goes to cardio/pulm rehab  since march 3 times per week and it is a challenge  Advance directive-does not have, given materials to work on that  Cognitive function addressed- see scanned forms- and if abnormal then additional documentation follows.  Doing well with memory and cognition- overall good Handles her own affairs occ walks into a room and forgets why  Works as well   [place] and [place] reviewed  Meds, vitals, and allergies reviewed.   ROS: See HPI.  Otherwise negative.    Weight : Wt Readings from Last 3 Encounters:  11/04/20 100 lb 3 oz (45.4 kg)  10/15/20 100 lb 12.8 oz (45.7 kg)  09/03/20 100 lb 8 oz (45.6 kg)   17.06 kg/m Underweight  Ensure regularly  Snacks  Regular meals   Hearing/vision:  Hearing Screening   125Hz  250Hz  500Hz  1000Hz  2000Hz  3000Hz  4000Hz  6000Hz  8000Hz   Right ear:   0 40 40  40    Left ear:   0 40 40  0    Vision Screening Comments: Eye exam in Sept 2021 with Dr. Kerin Ransom  She notices more trouble with L ear  Has not interfered with anything yet/ does not desire further eval   Care team: [place]  She is excited to turn 15 and retire! It will be better for your health  Thinks dust at work has made her worse  May look for part time work- likes to stay busy   HTN  (in setting of pulmonary HTN) with pedal edema  bp is stable today  No cp or palpitations or headaches or edema  No side effects to medicines  BP Readings from Last 3 Encounters:  11/04/20 124/66  10/15/20 120/80  08/27/20 (!) 144/78     Amlodipine 5 mg daily  hctz 25 mg daily  Pulse Readings from Last 3 Encounters:  11/04/20 81  10/15/20 72  08/27/20 66    Copd- symptoms are better with pulmonary rehab  Wears 02 full time now   RA and connective tissue dz Sees rheumatology enbrel and plaquenil tx  Stress reaction /mood with PHQ score of 11  Cholesterol  Lab Results  Component Value Date   CHOL 156 10/30/2020   CHOL 186 08/14/2018   CHOL 197  05/25/2016   Lab Results  Component Value Date   HDL 69.10 10/30/2020   HDL 63.30 08/14/2018   HDL 64.30 05/25/2016   Lab Results  Component Value Date   LDLCALC 78 10/30/2020   LDLCALC 105 (H) 08/14/2018   LDLCALC 118 (H) 05/25/2016   Lab Results  Component Value Date   TRIG 45.0 10/30/2020   TRIG 91.0 08/14/2018   TRIG 76.0 05/25/2016   Lab Results  Component Value Date   CHOLHDL 2 10/30/2020   CHOLHDL 3 08/14/2018   CHOLHDL 3 05/25/2016   No results found for: LDLDIRECT She is eating better  She eats carrots for her snack  Fish- more also  Healthy   Lab Results  Component Value Date   WBC 3.0 (L) 10/30/2020   HGB 14.5 10/30/2020   HCT 41.8 10/30/2020   MCV 97.6 10/30/2020   PLT 155.0 10/30/2020   Lab Results  Component Value Date   CREATININE 0.68 10/30/2020   BUN 10 10/30/2020   NA 139 10/30/2020   K 3.6 10/30/2020   CL 100 10/30/2020   CO2 32 10/30/2020   Lab Results  Component Value Date   ALT 30 10/30/2020   AST 30 10/30/2020   ALKPHOS 43 10/30/2020   BILITOT 0.8 10/30/2020   Lab Results  Component Value Date   TSH 1.74 10/30/2020    Patient Active Problem List   Diagnosis Date Noted  . Medicare annual wellness visit, subsequent 11/04/2020  . Chronic respiratory failure with hypoxia ([place]) 04/28/2020  . Healthcare maintenance 04/28/2020  . Elevated hemoglobin ([place]) 03/08/2019  . COPD, very severe ([place]) 08/15/2018  . Sleep disorder 10/10/2017  . Screening mammogram, encounter for 09/12/2017  . Ulcerative colitis ([place]) 02/01/2017  . Lumbar degenerative disc disease 01/20/2017  . Scoliosis 06/28/2016  . Welcome to Medicare preventive visit 06/10/2016  . Estrogen deficiency 06/10/2016  . Constipation 03/02/2016  . Underweight 03/02/2016  . Shortness of breath 05/16/2014  . History of COPD 05/16/2014  . Stress reaction 05/16/2014  . Connective tissue disease ([place]) 03/05/2014  . Pedal edema 10/15/2013  . Pulmonary hypertension ([place])  10/10/2011  . Encounter for routine gynecological examination 07/11/2011  . Routine general medical examination at a health care facility 07/03/2011  . HEMORRHOIDS, INTERNAL, WITH BLEEDING 03/15/2010  . Essential hypertension 11/16/2006  . Rheumatoid arthritis ([place]) 11/16/2006  . Osteoporosis 11/16/2006   Past Medical History:  Diagnosis Date  . Arthritis    rheumatoid  . Complication of anesthesia   . Hypertension   . Osteoporosis   . PONV (postoperative nausea and vomiting)   . Pulmonary hypertension ([place])   . Rheumatoid arthritis(714.0)    ? scleroderma   Past Surgical History:  Procedure Laterality Date  . colonosc neg  11/08  . COLONOSCOPY WITH  PROPOFOL N/A 09/30/2016   Procedure: COLONOSCOPY WITH PROPOFOL;  Surgeon: Jonathon Bellows, MD;  Location: [facility] ENDOSCOPY;  Service: Endoscopy;  Laterality: N/A;  . COLONOSCOPY WITH PROPOFOL N/A 12/08/2017   Procedure: COLONOSCOPY WITH PROPOFOL;  Surgeon: Jonathon Bellows, MD;  Location: [facility] ENDOSCOPY;  Service: Gastroenterology;  Laterality: N/A;  . Dexa  07/2005   OP hip- 3.0  . ESOPHAGOGASTRODUODENOSCOPY (EGD) WITH PROPOFOL N/A 07/01/2016   Procedure: ESOPHAGOGASTRODUODENOSCOPY (EGD) WITH PROPOFOL;  Surgeon: Jonathon Bellows, MD;  Location: [facility] ENDOSCOPY;  Service: Endoscopy;  Laterality: N/A;  . FLEXIBLE SIGMOIDOSCOPY N/A 07/01/2016   Procedure: FLEXIBLE SIGMOIDOSCOPY;  Surgeon: Jonathon Bellows, MD;  Location: [facility] ENDOSCOPY;  Service: Endoscopy;  Laterality: N/A;  . LEEP  2005  . Surgery- tendon release in hand  2006  . TUBAL LIGATION     Social History   Tobacco Use  . Smoking status: Former Smoker    Packs/day: 1.25    Years: 30.00    Pack years: 22.50    Quit date: 2006    Years since quitting: 16.3  . Smokeless tobacco: Never Used  . Tobacco comment: Quit in 8/07  Vaping Use  . Vaping Use: Never used  Substance Use Topics  . Alcohol use: No    Alcohol/week: 0.0 standard drinks  . Drug use: No   Family History  Problem Relation Age  of Onset  . Heart disease Mother   . Diabetes Mother   . Hypertension Father   . Macular degeneration Father   . Dementia Father        early  . Arthritis Father        RA  . Alcohol abuse Father   . Breast cancer Sister 29   No Known Allergies Current Outpatient Medications on File Prior to Visit  Medication Sig Dispense Refill  . albuterol (VENTOLIN HFA) 108 (90 Base) MCG/ACT inhaler Inhale 2 puffs into the lungs every 6 (six) hours as needed for wheezing or shortness of breath. 54 g 1  . alendronate (FOSAMAX) 70 MG tablet Take 1 tablet by mouth once a week.    . Aspirin 81 MG EC tablet Take 81 mg by mouth daily.    . Budeson-Glycopyrrol-Formoterol (BREZTRI AEROSPHERE) 160-9-4.8 MCG/ACT AERO Inhale 2 puffs into the lungs in the morning and at bedtime. 5.9 g 0  . etanercept (ENBREL) 50 MG/ML injection Inject 50 mg into the skin once a week.    . hydroxychloroquine (PLAQUENIL) 200 MG tablet Take 200 mg by mouth daily.    . mesalamine (LIALDA) 1.2 g EC tablet TAKE 1 TABLET EVERY DAY WITH BREAKFAST 90 tablet 3  . Multiple Vitamin (MULTIVITAMIN) capsule Take 1 capsule by mouth daily.     No current facility-administered medications on file prior to visit.    Review of Systems  Constitutional: Positive for fatigue. Negative for activity change, appetite change, fever and unexpected weight change.  HENT: Negative for congestion, ear pain, rhinorrhea, sinus pressure and sore throat.   Eyes: Negative for pain, redness and visual disturbance.  Respiratory: Positive for shortness of breath. Negative for cough and wheezing.   Cardiovascular: Negative for chest pain and palpitations.  Gastrointestinal: Negative for abdominal pain, blood in stool, constipation and diarrhea.  Endocrine: Negative for polydipsia and polyuria.  Genitourinary: Negative for dysuria, frequency and urgency.  Musculoskeletal: Positive for arthralgias and back pain. Negative for myalgias.  Skin: Negative for pallor and  rash.  Allergic/Immunologic: Negative for environmental allergies.  Neurological: Negative for dizziness, syncope and headaches.  Hematological: Negative for adenopathy. Does not bruise/bleed easily.  Psychiatric/Behavioral: Negative for decreased concentration and dysphoric mood. The patient is not nervous/anxious.        Objective:   Physical Exam Constitutional:      General: She is not in acute distress.    Appearance: Normal appearance. She is well-developed. She is not ill-appearing or diaphoretic.     Comments: Underweight  Frail appearing   HENT:     Head: Normocephalic and atraumatic.     Right Ear: Tympanic membrane, ear canal and external ear normal.     Left Ear: Tympanic membrane, ear canal and external ear normal.     Nose: Nose normal. No congestion.     Mouth/Throat:     Mouth: Mucous membranes are moist.     Pharynx: Oropharynx is clear. No posterior oropharyngeal erythema.  Eyes:     General: No scleral icterus.    Extraocular Movements: Extraocular movements intact.     Conjunctiva/sclera: Conjunctivae normal.     Pupils: Pupils are equal, round, and reactive to light.  Neck:     Thyroid: No thyromegaly.     Vascular: No carotid bruit or JVD.  Cardiovascular:     Rate and Rhythm: Normal rate and regular rhythm.     Pulses: Normal pulses.     Heart sounds: Normal heart sounds. No gallop.   Pulmonary:     Effort: Pulmonary effort is normal. No respiratory distress.     Breath sounds: Normal breath sounds. No wheezing.     Comments: Diffusely distant bs No wheeze Chest:     Chest wall: No tenderness.  Abdominal:     General: Bowel sounds are normal. There is no distension or abdominal bruit.     Palpations: Abdomen is soft. There is no mass.     Tenderness: There is no abdominal tenderness.     Hernia: No hernia is present.  Genitourinary:    Comments: Breast exam: No mass, nodules, thickening, tenderness, bulging, retraction, inflamation, nipple  discharge or skin changes noted.  No axillary or clavicular LA.     Musculoskeletal:        General: No tenderness. Normal range of motion.     Cervical back: Normal range of motion and neck supple. No rigidity. No muscular tenderness.     Right lower leg: No edema.     Left lower leg: No edema.     Comments: Mild kyphosis   Lymphadenopathy:     Cervical: No cervical adenopathy.  Skin:    General: Skin is warm and dry.     Coloration: Skin is not pale.     Findings: No erythema or rash.     Comments: Solar lentigines diffusely sks diffusely  Some thin skin and bruising on extremities   Neurological:     Mental Status: She is alert. Mental status is at baseline.     Cranial Nerves: No cranial nerve deficit.     Motor: No abnormal muscle tone.     Coordination: Coordination normal.     Gait: Gait normal.     Deep Tendon Reflexes: Reflexes are normal and symmetric. Reflexes normal.  Psychiatric:        Mood and Affect: Mood normal.        Cognition and Memory: Cognition and memory normal.     Comments: Mood is good Positive outlook            Assessment & Plan:   Problem List Items Addressed This  Visit      Cardiovascular and Mediastinum   Essential hypertension    bp in fair control at this time  BP Readings from Last 1 Encounters:  11/04/20 124/66   No changes needed Most recent labs reviewed  Disc lifstyle change with low sodium diet and exercise  Plan to continue amlodipine 5 mg daily and hctz 25 mg daily      Relevant Medications   amLODipine (NORVASC) 5 MG tablet   hydrochlorothiazide (HYDRODIURIL) 25 MG tablet   Pulmonary hypertension (HCC)    Doing well with cardio/pulm rehab 02 around the clock now      Relevant Medications   amLODipine (NORVASC) 5 MG tablet   hydrochlorothiazide (HYDRODIURIL) 25 MG tablet     Respiratory   COPD, very severe (HCC)    In cardiopulm rehab 02 ATC This has been helpful         Digestive   Ulcerative colitis  ([place])    Continues mesalamine  Fairly stable  Colonoscopy utd Under care of GI        Musculoskeletal and Integument   Rheumatoid arthritis ([place])    Continues enbrel and plaquenil per rheumatology      Osteoporosis    dexa 2018 Followed by rheumatology  D is tx On alendronate No falls or fractures Better exercise lately        Other   Routine general medical examination at a health care facility    Reviewed health habits including diet and exercise and skin cancer prevention Reviewed appropriate screening tests for age  Also reviewed health mt list, fam hx and immunization status , as well as social and family history   See HPI Labs reviewed utd colonoscopy and mammogram  utd imms (per pt cannot take the shingrix) dexa 1/18 and followed by rheumatology No falls or fx with OP Takes mvi (holding D and level is ok) Better exercise with pulm rehab Given materials to work on [employer] directive No cognitive concerns Some abn on hearing screen/pt declines further eval at this time utd eye and vision care      Pedal edema    Continues hctz Labs are stable      Underweight    Continues to work on protein intake Former smoker with copd        Elevated hemoglobin ([place])    Normal cbc today      Medicare annual wellness visit, subsequent - Primary    Reviewed health habits including diet and exercise and skin cancer prevention Reviewed appropriate screening tests for age  Also reviewed health mt list, fam hx and immunization status , as well as social and family history   See HPI Labs reviewed utd colonoscopy and mammogram  utd imms (per pt cannot take the shingrix) dexa 1/18 and followed by rheumatology No falls or fx with OP Takes mvi (holding D and level is ok) Better exercise with pulm rehab Given materials to work on adv directive No cognitive concerns Some abn on hearing screen/pt declines further eval at this time utd eye and vision care

## 2020-11-04 NOTE — Assessment & Plan Note (Signed)
Normal cbc today

## 2020-11-04 NOTE — Assessment & Plan Note (Signed)
Reviewed health habits including diet and exercise and skin cancer prevention Reviewed appropriate screening tests for age  Also reviewed health mt list, fam hx and immunization status , as well as social and family history   See HPI Labs reviewed utd colonoscopy and mammogram  utd imms (per pt cannot take the shingrix) dexa 1/18 and followed by rheumatology No falls or fx with OP Takes mvi (holding D and level is ok) Better exercise with pulm rehab Given materials to work on adv directive No cognitive concerns Some abn on hearing screen/pt declines further eval at this time utd eye and vision care

## 2020-11-04 NOTE — Assessment & Plan Note (Signed)
Continues to work on protein intake Former smoker with copd

## 2020-11-04 NOTE — Assessment & Plan Note (Signed)
Doing well with cardio/pulm rehab 02 around the clock now

## 2020-11-04 NOTE — Assessment & Plan Note (Signed)
In cardiopulm rehab 02 ATC This has been helpful

## 2020-11-04 NOTE — Assessment & Plan Note (Signed)
Continues hctz Labs are stable

## 2020-11-05 ENCOUNTER — Ambulatory Visit: Payer: Medicare HMO | Admitting: Pulmonary Disease

## 2020-11-05 ENCOUNTER — Encounter: Payer: Medicare HMO | Admitting: *Deleted

## 2020-11-05 DIAGNOSIS — I272 Pulmonary hypertension, unspecified: Secondary | ICD-10-CM | POA: Diagnosis not present

## 2020-11-05 DIAGNOSIS — J449 Chronic obstructive pulmonary disease, unspecified: Secondary | ICD-10-CM | POA: Diagnosis not present

## 2020-11-05 NOTE — Progress Notes (Signed)
Daily Session Note  Patient Details  Name: Carrie Hendricks MRN: [id number] Date of Birth: 12/09/1950 Referring Provider:   Flowsheet Row Pulmonary Rehab from 09/03/2020 in [facility]  Referring Provider Gonzalez, Carmen MD      Encounter Date: 11/05/2020  Check In:  Session Check In - 11/05/20 1719      Check-In   Supervising physician immediately available to respond to emergencies See telemetry face sheet for immediately available ER MD    Location [facility]    Staff Present  , RN BSN;Joseph Hood RCP,RRT,BSRT;Kara Langdon, MS Exercise Physiologist    Virtual Visit No    Medication changes reported     No    Fall or balance concerns reported    No    Warm-up and Cool-down Performed on first and last piece of equipment    Resistance Training Performed Yes    VAD Patient? No    PAD/SET Patient? No      Pain Assessment   Currently in Pain? No/denies              Social History   Tobacco Use  Smoking Status Former Smoker  . Packs/day: 1.25  . Years: 30.00  . Pack years: 37.50  . Quit date: 2006  . Years since quitting: 16.3  Smokeless Tobacco Never Used  Tobacco Comment   Quit in 8/07    Goals Met:  Independence with exercise equipment Exercise tolerated well No report of cardiac concerns or symptoms Strength training completed today  Goals Unmet:  Not Applicable  Comments: Pt able to follow exercise prescription today without complaint.  Will continue to monitor for progression.    Dr. Mark Miller is Medical Director for [facility] and [facility]. 

## 2020-11-09 ENCOUNTER — Other Ambulatory Visit: Payer: Self-pay

## 2020-11-09 ENCOUNTER — Encounter: Payer: Medicare HMO | Admitting: *Deleted

## 2020-11-09 DIAGNOSIS — I272 Pulmonary hypertension, unspecified: Secondary | ICD-10-CM | POA: Diagnosis not present

## 2020-11-09 DIAGNOSIS — J449 Chronic obstructive pulmonary disease, unspecified: Secondary | ICD-10-CM

## 2020-11-09 NOTE — Progress Notes (Signed)
Daily Session Note  Patient Details  Name: Carrie Hendricks MRN: [id number] Date of Birth: 07/12/1950 Referring Provider:   Flowsheet Row Pulmonary Rehab from 09/03/2020 in [facility]  Referring Provider Gonzalez, Carmen MD      Encounter Date: 11/09/2020  Check In:  Session Check In - 11/09/20 1701      Check-In   Supervising physician immediately available to respond to emergencies See telemetry face sheet for immediately available ER MD    Location [facility]    Staff Present  , RN BSN;Kelly Hayes, BS, ACSM CEP, Exercise Physiologist;Kara Langdon, MS Exercise Physiologist    Virtual Visit No    Medication changes reported     No    Fall or balance concerns reported    No    Warm-up and Cool-down Performed on first and last piece of equipment    Resistance Training Performed Yes    VAD Patient? No    PAD/SET Patient? No      Pain Assessment   Currently in Pain? No/denies              Social History   Tobacco Use  Smoking Status Former Smoker  . Packs/day: 1.25  . Years: 30.00  . Pack years: 37.50  . Quit date: 2006  . Years since quitting: 16.3  Smokeless Tobacco Never Used  Tobacco Comment   Quit in 8/07    Goals Met:  Independence with exercise equipment Exercise tolerated well No report of cardiac concerns or symptoms Strength training completed today  Goals Unmet:  Not Applicable  Comments: Pt able to follow exercise prescription today without complaint.  Will continue to monitor for progression.    Dr. Mark Miller is Medical Director for [facility] and [facility]. 

## 2020-11-10 DIAGNOSIS — K5190 Ulcerative colitis, unspecified, without complications: Secondary | ICD-10-CM | POA: Diagnosis not present

## 2020-11-11 ENCOUNTER — Other Ambulatory Visit: Payer: Self-pay

## 2020-11-11 DIAGNOSIS — I272 Pulmonary hypertension, unspecified: Secondary | ICD-10-CM

## 2020-11-11 DIAGNOSIS — J449 Chronic obstructive pulmonary disease, unspecified: Secondary | ICD-10-CM | POA: Diagnosis not present

## 2020-11-11 NOTE — Progress Notes (Signed)
Daily Session Note  Patient Details  Name: JULIANI LADUKE MRN: [id number] Date of Birth: 1950/12/24 Referring Provider:   Flowsheet Row Pulmonary Rehab from 09/03/2020 in [facility] Pul[facility] Rehab  Referring Provider Vernard Gambles MD      Encounter Date: 11/11/2020  Check In:  Session Check In - 11/11/20 1723      Check-In   Supervising physician immediately available to respond to emergencies See telemetry face sheet for immediately available ER MD    Location [facility]    Staff Present Birdie Sons, MPA, RN;Meredith Sherryll Burger, RN BSN;Joseph Lou Min[place], [place] Exercise Physiologist    Virtual Visit No    Medication changes reported     No    Fall or balance concerns reported    No    Warm-up and Cool-down Performed on first and last piece of equipment    Resistance Training Performed Yes    VAD Patient? No    PAD/SET Patient? No      Pain Assessment   Currently in Pain? No/denies              Social History   Tobacco Use  Smoking Status Former Smoker  . Packs/day: 1.25  . Years: 30.00  . Pack years: 37.50  . Quit date: 2006  . Years since quitting: 16.3  Smokeless Tobacco Never Used  Tobacco Comment   Quit in 8/07    Goals Met:  Independence with exercise equipment Exercise tolerated well No report of cardiac concerns or symptoms Strength training completed today  Goals Unmet:  Not Applicable  Comments: Pt able to follow exercise prescription today without complaint.  Will continue to monitor for progression.    Dr. Emily Filbert is Medical Director for [facility] and [facility].

## 2020-11-12 ENCOUNTER — Encounter: Payer: Medicare HMO | Admitting: *Deleted

## 2020-11-12 DIAGNOSIS — J449 Chronic obstructive pulmonary disease, unspecified: Secondary | ICD-10-CM | POA: Diagnosis not present

## 2020-11-12 DIAGNOSIS — I272 Pulmonary hypertension, unspecified: Secondary | ICD-10-CM

## 2020-11-12 NOTE — Progress Notes (Signed)
Daily Session Note  Patient Details  Name: Carrie Hendricks MRN: [id number] Date of Birth: 05/20/1951 Referring Provider:   Flowsheet Row Pulmonary Rehab from 09/03/2020 in [facility] Card[facility] and Pulmonary Rehab  Referring Provider Vernard Gambles MD      Encounter Date: 11/12/2020  Check In:  Session Check In - 11/12/20 1752      Check-In   Supervising physician immediately available to respond to emergencies See telemetry face sheet for immediately available ER MD    Location [facility]    Staff Present Heath Lark, RN, BSN, CCRP;Joseph Foy [place], [place], ACSM CEP, Exercise Physiologist    Virtual Visit No    Medication changes reported     No    Fall or balance concerns reported    No    Warm-up and Cool-down Performed on first and last piece of equipment    Resistance Training Performed Yes    VAD Patient? No    PAD/SET Patient? No      Pain Assessment   Currently in Pain? No/denies              Social History   Tobacco Use  Smoking Status Former Smoker  . Packs/day: 1.25  . Years: 30.00  . Pack years: 37.50  . Quit date: 2006  . Years since quitting: 16.3  Smokeless Tobacco Never Used  Tobacco Comment   Quit in 8/07    Goals Met:  Proper associated with RPD/PD & O2 Sat Independence with exercise equipment Exercise tolerated well No report of cardiac concerns or symptoms  Goals Unmet:  Not Applicable  Comments: Pt able to follow exercise prescription today without complaint.  Will continue to monitor for progression.    Dr. Emily Filbert is Medical Director for [facility] and [facility].

## 2020-11-14 DIAGNOSIS — J449 Chronic obstructive pulmonary disease, unspecified: Secondary | ICD-10-CM | POA: Diagnosis not present

## 2020-11-16 ENCOUNTER — Encounter: Payer: Medicare HMO | Admitting: *Deleted

## 2020-11-16 ENCOUNTER — Other Ambulatory Visit: Payer: Self-pay

## 2020-11-16 DIAGNOSIS — J449 Chronic obstructive pulmonary disease, unspecified: Secondary | ICD-10-CM

## 2020-11-16 DIAGNOSIS — I272 Pulmonary hypertension, unspecified: Secondary | ICD-10-CM | POA: Diagnosis not present

## 2020-11-16 NOTE — Progress Notes (Signed)
Daily Session Note  Patient Details  Name: Carrie Hendricks MRN: [id number] Date of Birth: 1950/10/18 Referring Provider:   Flowsheet Row Pulmonary Rehab from 09/03/2020 in [facility] Cardiac and Pulmonary Rehab  Referring Provider Vernard Gambles MD      Encounter Date: 11/16/2020  Check In:  Session Check In - 11/16/20 1713      Check-In   Supervising physician immediately available to respond to emergencies See telemetry face sheet for immediately available ER MD    Location [facility]-Cardiac & Pulmonary Rehab    Staff Present Renita Papa, RN Moises Blood, BS, ACSM CEP, Exercise Physiologist;Amanda Oletta Darter, BA, ACSM CEP, Exercise Physiologist;Laureen Owens Shark, BS, RRT, CPFT    Virtual Visit No    Medication changes reported     No    Fall or balance concerns reported    No    Warm-up and Cool-down Performed on first and last piece of equipment    Resistance Training Performed Yes    VAD Patient? No    PAD/SET Patient? No      Pain Assessment   Currently in Pain? No/denies              Social History   Tobacco Use  Smoking Status Former Smoker  . Packs/day: 1.25  . Years: 30.00  . Pack years: 37.50  . Quit date: 2006  . Years since quitting: 16.3  Smokeless Tobacco Never Used  Tobacco Comment   Quit in 8/07    Goals Met:  Independence with exercise equipment Exercise tolerated well No report of cardiac concerns or symptoms Strength training completed today  Goals Unmet:  Not Applicable  Comments: Pt able to follow exercise prescription today without complaint.  Will continue to monitor for progression.  6 Minute Walk    Row Name 09/03/20 1711         6 Minute Walk   Phase Initial     Distance 400 feet     Walk Time 3 minutes     # of Rest Breaks 0     MPH 1.51     METS 1.91     RPE 13     Perceived Dyspnea  1     VO2 Peak 6.69     Symptoms No     Resting HR 77 bpm     Resting BP 126/72     Resting Oxygen Saturation  98 %     Exercise Oxygen  Saturation  during 6 min walk 80 %  Stopped test     Max Ex. HR 89 bpm     Max Ex. BP 136/74     2 Minute Post BP 124/74           Interval HR   1 Minute HR 85     2 Minute HR 88     3 Minute HR 88     4 Minute HR 89     5 Minute HR --  Stopped test     6 Minute HR --  Stopped test     2 Minute Post HR 74     Interval Heart Rate? Yes           Interval Oxygen   Interval Oxygen? Yes     Baseline Oxygen Saturation % 98 %     1 Minute Oxygen Saturation % 90 %     1 Minute Liters of Oxygen 2 L     2 Minute Oxygen Saturation % 84 %  2 Minute Liters of Oxygen 2 L     3 Minute Oxygen Saturation % 81 %     3 Minute Liters of Oxygen 2 L     4 Minute Oxygen Saturation % 80 %     4 Minute Liters of Oxygen 2 L     5 Minute Oxygen Saturation % --  Stopped test     6 Minute Oxygen Saturation % --  Stopped test     2 Minute Post Oxygen Saturation % 94 %     2 Minute Post Liters of Oxygen 2 L              Dr. Emily Filbert is Medical Director for [facility].

## 2020-11-17 ENCOUNTER — Ambulatory Visit: Payer: Medicare HMO | Admitting: Gastroenterology

## 2020-11-17 ENCOUNTER — Encounter: Payer: Self-pay | Admitting: Gastroenterology

## 2020-11-17 VITALS — BP 118/70 | HR 80 | Ht 64.25 in | Wt 98.8 lb

## 2020-11-17 DIAGNOSIS — K5190 Ulcerative colitis, unspecified, without complications: Secondary | ICD-10-CM

## 2020-11-17 MED ORDER — SULFASALAZINE 500 MG PO TABS: 500.0000 mg | ORAL_TABLET | Freq: Four times a day (QID) | ORAL | 3 refills | Status: DC

## 2020-11-17 NOTE — Progress Notes (Signed)
Jonathon Bellows MD, MRCP(U.K) [address]  [place], [place] [address]  Main: [phone]  Fax: [phone]   Primary Care Physician: Tower, Wynelle Fanny, MD  Primary Gastroenterologist:  Dr. Jonathon Bellows   Ulcerative colitis follow up   [name] is a 70 y.o. female  Summary of history :  Is here to follow-up for ulcerative colitis pan colitis since 06/2016 .   Was seen and evaluated by Dr. 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   EGD 06/2016: biopsies showed active chronic gastritis with H pylori , marked duodenitis , normal esophageal biopsies.H pylori stool antigen is negative 08/16/16   Colonoscopy 06/2016 : unable to perform colonoscopy as she had severe colitis on the left side of the colon - we had to abort the procedure at the 30 cm mark.Biopsies confirmed Active colitis with chronicity   Colonoscopy :  3/3/0/18 and bx of the terminal ileum was normal but biopsies of the ascending colon , transverse , descending, sigmoid and rectum showed some areas of inflammation and areas with chronic inflammation changes.   Colonoscopy 12/2017 -single adenoma resected . Colon bx throughout the colon showed no active colitis.  07/2016: DEXA scan normal immune to hepatitis a and B.  Interval history 08/29/2019-11/17/2020  11/10/2020:  CBC,CRP,CMP- normal.   Uptodate with Hepatitis, Pneumococcal vaccine, COVID shot and flu shot  Has been on sulfasalazine in the past but presently on mesalamine but would like it to be changed back to sulfasalazine due to cost.  Having 1 bowel movement per day.  No GI symptoms.  Doing well.  No complaints.  No blood in the stools.  No urgency.  07/25/2019: B12 normal, hemoglobin 15.2, folate 43.    Current Outpatient Medications  Medication Sig Dispense Refill  . albuterol (VENTOLIN HFA) 108 (90 Base) MCG/ACT inhaler Inhale 2 puffs into the lungs every 6 (six) hours as needed for wheezing or  shortness of breath. 54 g 1  . alendronate (FOSAMAX) 70 MG tablet Take 1 tablet by mouth once a week.    Marland Kitchen amLODipine (NORVASC) 5 MG tablet Take 1 tablet (5 mg total) by mouth daily. 90 tablet 3  . Aspirin 81 MG EC tablet Take 81 mg by mouth daily.    . Budeson-Glycopyrrol-Formoterol (BREZTRI AEROSPHERE) 160-9-4.8 MCG/ACT AERO Inhale 2 puffs into the lungs in the morning and at bedtime. 5.9 g 0  . etanercept (ENBREL) 50 MG/ML injection Inject 50 mg into the skin once a week.    . hydrochlorothiazide (HYDRODIURIL) 25 MG tablet Take 1 tablet (25 mg total) by mouth daily. 90 tablet 3  . hydroxychloroquine (PLAQUENIL) 200 MG tablet Take 200 mg by mouth daily.    . mesalamine (LIALDA) 1.2 g EC tablet TAKE 1 TABLET EVERY DAY WITH BREAKFAST 90 tablet 3  . Multiple Vitamin (MULTIVITAMIN) capsule Take 1 capsule by mouth daily.     No current facility-administered medications for this visit.    Allergies as of 11/17/2020  . (No Known Allergies)    ROS:  General: Negative for anorexia, weight loss, fever, chills, fatigue, weakness. ENT: Negative for hoarseness, difficulty swallowing , nasal congestion. CV: Negative for chest pain, angina, palpitations, dyspnea on exertion, peripheral edema.  Respiratory: Negative for dyspnea at rest, dyspnea on exertion, cough, sputum, wheezing.  GI: See history of present illness. GU:  Negative for dysuria, hematuria, urinary incontinence, urinary frequency, nocturnal urination.  Endo: Negative for unusual weight change.  Physical Examination:   BP 118/70 (BP Location: Left Arm, Patient Position: Sitting, Cuff Size: Small)   Pulse 80   Ht 5' 4.25" (1.632 m)   Wt 98 lb 12.8 oz (44.8 kg)   BMI 16.83 kg/m   General: Appears thin and cachectic on oxygen Eyes: No icterus. Conjunctivae pink. Neuro: Alert and oriented x 3.  Grossly intact. Skin: Warm and dry, no jaundice.   Psych: Alert and cooperative, normal mood and affect.   Imaging Studies: No  results found.  Assessment and Plan:   Carrie Hendricks is a 70 y.o. y/o female here to follow up forUlcerativecolitis:.  Clinically in remission.  Presently on Fosamax, Enbrel prescribed by her rheumatologist.  Up-to-date with her vaccination.  Plan 1.  Suggest Tower, Wynelle Fanny, MD to monitor CBC, CMP every for 4- 6 monthly  Dr Jonathon Bellows  MD,MRCP [facility]) Follow up in 1 year video visit

## 2020-11-18 ENCOUNTER — Telehealth: Payer: Self-pay | Admitting: Pulmonary Disease

## 2020-11-18 ENCOUNTER — Encounter: Payer: Self-pay | Admitting: *Deleted

## 2020-11-18 ENCOUNTER — Other Ambulatory Visit: Payer: Self-pay

## 2020-11-18 DIAGNOSIS — I272 Pulmonary hypertension, unspecified: Secondary | ICD-10-CM

## 2020-11-18 DIAGNOSIS — J449 Chronic obstructive pulmonary disease, unspecified: Secondary | ICD-10-CM | POA: Diagnosis not present

## 2020-11-18 MED ORDER — ALBUTEROL SULFATE HFA 108 (90 BASE) MCG/ACT IN AERS: 2.0000 | INHALATION_SPRAY | Freq: Four times a day (QID) | RESPIRATORY_TRACT | 0 refills | Status: DC | PRN

## 2020-11-18 NOTE — Progress Notes (Signed)
Daily Session Note  Patient Details  Name: Carrie Hendricks MRN: [id number] Date of Birth: 06/13/1951 Referring Provider:   Flowsheet Row Pulmonary Rehab from 09/03/2020 in [facility] Cardiac and Pulmonary Rehab  Referring Provider Gonzalez, Carmen MD      Encounter Date: 11/18/2020  Check In:  Session Check In - 11/18/20 1715      Check-In   Supervising physician immediately available to respond to emergencies See telemetry face sheet for immediately available ER MD    Location [facility]-Cardiac & Pulmonary Rehab    Staff Present  , MPA, RN;Joseph Hood RCP,RRT,BSRT;Kara Langdon, MS Exercise Physiologist;Melissa Caiola RDN, LDN    Virtual Visit No    Medication changes reported     No    Fall or balance concerns reported    No    Warm-up and Cool-down Performed on first and last piece of equipment    Resistance Training Performed Yes    VAD Patient? No    PAD/SET Patient? No      Pain Assessment   Currently in Pain? No/denies              Social History   Tobacco Use  Smoking Status Former Smoker  . Packs/day: 1.25  . Years: 30.00  . Pack years: 37.50  . Quit date: 2006  . Years since quitting: 16.3  Smokeless Tobacco Never Used  Tobacco Comment   Quit in 8/07    Goals Met:  Independence with exercise equipment Exercise tolerated well No report of cardiac concerns or symptoms Strength training completed today  Goals Unmet:  Not Applicable  Comments: Pt able to follow exercise prescription today without complaint.  Will continue to monitor for progression.    Dr. Mark Miller is Medical Director for [facility] and [facility]. 

## 2020-11-18 NOTE — Progress Notes (Signed)
Pulmonary Individual Treatment Plan  Patient Details  Name: Carrie Hendricks MRN: [id number] Date of Birth: 04/05/1951 Referring Provider:   Flowsheet Row Pulmonary Rehab from 09/03/2020 in [facility] Cardiac and Pulmonary Rehab  Referring Provider Vernard Gambles MD      Initial Encounter Date:  Flowsheet Row Pulmonary Rehab from 09/03/2020 in [facility] Cardiac and Pulmonary Rehab  Date 09/03/20      Visit Diagnosis: Pulmonary hypertension ([place])  Patient's Home Medications on Admission:  Current Outpatient Medications:  .  albuterol (VENTOLIN HFA) 108 (90 Base) MCG/ACT inhaler, Inhale 2 puffs into the lungs every 6 (six) hours as needed for wheezing or shortness of breath., Disp: 54 g, Rfl: 1 .  alendronate (FOSAMAX) 70 MG tablet, Take 1 tablet by mouth once a week., Disp: , Rfl:  .  amLODipine (NORVASC) 5 MG tablet, Take 1 tablet (5 mg total) by mouth daily., Disp: 90 tablet, Rfl: 3 .  Aspirin 81 MG EC tablet, Take 81 mg by mouth daily., Disp: , Rfl:  .  Budeson-Glycopyrrol-Formoterol (BREZTRI AEROSPHERE) 160-9-4.8 MCG/ACT AERO, Inhale 2 puffs into the lungs in the morning and at bedtime., Disp: 5.9 g, Rfl: 0 .  etanercept (ENBREL) 50 MG/ML injection, Inject 50 mg into the skin once a week., Disp: , Rfl:  .  hydrochlorothiazide (HYDRODIURIL) 25 MG tablet, Take 1 tablet (25 mg total) by mouth daily., Disp: 90 tablet, Rfl: 3 .  hydroxychloroquine (PLAQUENIL) 200 MG tablet, Take 200 mg by mouth daily., Disp: , Rfl:  .  Multiple Vitamin (MULTIVITAMIN) capsule, Take 1 capsule by mouth daily., Disp: , Rfl:  .  sulfaSALAzine (AZULFIDINE) 500 MG tablet, Take 1 tablet (500 mg total) by mouth 4 (four) times daily., Disp: 360 tablet, Rfl: 3  Past Medical History: Past Medical History:  Diagnosis Date  . Arthritis    rheumatoid  . Complication of anesthesia   . Hypertension   . Osteoporosis   . PONV (postoperative nausea and vomiting)   . Pulmonary hypertension ([place])   . Rheumatoid  arthritis(714.0)    ? scleroderma    Tobacco Use: Social History   Tobacco Use  Smoking Status Former Smoker  . Packs/day: 1.25  . Years: 30.00  . Pack years: 37.50  . Quit date: 2006  . Years since quitting: 16.3  Smokeless Tobacco Never Used  Tobacco Comment   Quit in 8/07    Labs: Recent Review Flowsheet Data    Labs for ITP Cardiac and Pulmonary Rehab Latest Ref Rng & Units 03/05/2014 05/15/2015 05/25/2016 08/14/2018 10/30/2020   Cholestrol 0 - 200 mg/dL 195 177 197 186 156   LDLCALC 0 - 99 mg/dL 113(H) 93 118(H) 105(H) 78   HDL >39.00 mg/dL 69.80 70 64.30 63.30 69.10   Trlycerides 0.0 - 149.0 mg/dL 63.0 69 76.0 91.0 45.0       Pulmonary Assessment Scores:  Pulmonary Assessment Scores    Row Name 09/03/20 1718         ADL UCSD   ADL Phase Entry     SOB Score total 54     Rest 1     Walk 3     Stairs 5     Bath 2     Dress 3     Shop 3           CAT Score   CAT Score 27           mMRC Score   mMRC Score 2  UCSD: Self-administered rating of dyspnea associated with activities of daily living (ADLs) 6-point scale (0 = "not at all" to 5 = "maximal or unable to do because of breathlessness")  Scoring Scores range from 0 to 120.  Minimally important difference is 5 units  CAT: CAT can identify the health impairment of COPD patients and is better correlated with disease progression.  CAT has a scoring range of zero to 40. The CAT score is classified into four groups of low (less than 10), medium (10 - 20), high (21-30) and very high (31-40) based on the impact level of disease on health status. A CAT score over 10 suggests significant symptoms.  A worsening CAT score could be explained by an exacerbation, poor medication adherence, poor inhaler technique, or progression of COPD or comorbid conditions.  CAT MCID is 2 points  mMRC: mMRC (Modified Medical Research Council) Dyspnea Scale is used to assess the degree of baseline functional disability  in patients of respiratory disease due to dyspnea. No minimal important difference is established. A decrease in score of 1 point or greater is considered a positive change.   Pulmonary Function Assessment:   Exercise Target Goals: Exercise Program Goal: Individual exercise prescription set using results from initial 6 min walk test and THRR while considering  patient's activity barriers and safety.   Exercise Prescription Goal: Initial exercise prescription builds to 30-45 minutes a day of aerobic activity, 2-3 days per week.  Home exercise guidelines will be given to patient during program as part of exercise prescription that the participant will acknowledge.  Education: Aerobic Exercise: - Group verbal and visual presentation on the components of exercise prescription. Introduces F.I.T.T principle from ACSM for exercise prescriptions.  Reviews F.I.T.T. principles of aerobic exercise including progression. Written material given at graduation. Flowsheet Row Pulmonary Rehab from 11/11/2020 in [facility] Cardiac and Pulmonary Rehab  Date 10/21/20  Educator AS  Instruction Review Code 1- Verbalizes Understanding      Education: Resistance Exercise: - Group verbal and visual presentation on the components of exercise prescription. Introduces F.I.T.T principle from ACSM for exercise prescriptions  Reviews F.I.T.T. principles of resistance exercise including progression. Written material given at graduation. Flowsheet Row Pulmonary Rehab from 11/11/2020 in [facility] Cardiac and Pulmonary Rehab  Date 10/28/20  Educator AS  Instruction Review Code 1- Verbalizes Understanding       Education: Exercise & Equipment Safety: - Individual verbal instruction and demonstration of equipment use and safety with use of the equipment. Flowsheet Row Pulmonary Rehab from 11/11/2020 in [facility] Cardiac and Pulmonary Rehab  Education need identified 09/03/20  Date 09/03/20  Educator [place]  Instruction Review Code 1-  Verbalizes Understanding      Education: Exercise Physiology & General Exercise Guidelines: - Group verbal and written instruction with models to review the exercise physiology of the cardiovascular system and associated critical values. Provides general exercise guidelines with specific guidelines to those with heart or lung disease.  Flowsheet Row Pulmonary Rehab from 11/11/2020 in [facility] Cardiac and Pulmonary Rehab  Date 10/14/20  Educator AS  Instruction Review Code 1- Verbalizes Understanding      Education: Flexibility, Balance, Mind/Body Relaxation: - Group verbal and visual presentation with interactive activity on the components of exercise prescription. Introduces F.I.T.T principle from ACSM for exercise prescriptions. Reviews F.I.T.T. principles of flexibility and balance exercise training including progression. Also discusses the mind body connection.  Reviews various relaxation techniques to help reduce and manage stress (i.e. Deep breathing, progressive muscle relaxation, and visualization). Balance  handout provided to take home. Written material given at graduation.   Activity Barriers & Risk Stratification:  Activity Barriers & Cardiac Risk Stratification - 09/03/20 1705      Activity Barriers & Cardiac Risk Stratification   Activity Barriers Arthritis;Shortness of Breath;Back Problems;Deconditioning;Other (comment)    Comments Sciatica, DDD           6 Minute Walk:  6 Minute Walk    Row Name 09/03/20 1711 11/16/20 1739       6 Minute Walk   Phase Initial --    Distance 400 feet 800 feet    Distance % Change -- 100 %    Distance Feet Change -- 400 ft    Walk Time 3 minutes 6 minutes    # of Rest Breaks 0 0    MPH 1.51 1.51    METS 1.91 2.93    RPE 13 13    Perceived Dyspnea  1 2    VO2 Peak 6.69 10.25    Symptoms No No    Resting HR 77 bpm 76 bpm    Resting BP 126/72 138/82    Resting Oxygen Saturation  98 % 93 %    Exercise Oxygen Saturation  during 6  min walk 80 %  Stopped test 91 %    Max Ex. HR 89 bpm 102 bpm    Max Ex. BP 136/74 134/68    2 Minute Post BP 124/74 --         Interval HR   1 Minute HR 85 86    2 Minute HR 88 95    3 Minute HR 88 96    4 Minute HR 89 96    5 Minute HR --  Stopped test 101    6 Minute HR --  Stopped test 102    2 Minute Post HR 74 --    Interval Heart Rate? Yes Yes         Interval Oxygen   Interval Oxygen? Yes Yes    Baseline Oxygen Saturation % 98 % 93 %    1 Minute Oxygen Saturation % 90 % 97 %    1 Minute Liters of Oxygen 2 L 2 L    2 Minute Oxygen Saturation % 84 % 94 %    2 Minute Liters of Oxygen 2 L 2 L    3 Minute Oxygen Saturation % 81 % 94 %    3 Minute Liters of Oxygen 2 L 2 L    4 Minute Oxygen Saturation % 80 % 92 %    4 Minute Liters of Oxygen 2 L 2 L    5 Minute Oxygen Saturation % --  Stopped test 91 %    5 Minute Liters of Oxygen -- 2 L    6 Minute Oxygen Saturation % --  Stopped test 91 %    6 Minute Liters of Oxygen -- 2 L    2 Minute Post Oxygen Saturation % 94 % --    2 Minute Post Liters of Oxygen 2 L --          Oxygen Initial Assessment:  Oxygen Initial Assessment - 09/03/20 1717      Home Oxygen   Home Oxygen Device E-Tanks;Portable Concentrator;Home Concentrator    Sleep Oxygen Prescription Continuous    Liters per minute 2    Home Exercise Oxygen Prescription Continuous    Liters per minute 2    Home Resting Oxygen Prescription Continuous  Liters per minute 2    Compliance with Home Oxygen Use Yes      Initial 6 min Walk   Oxygen Used Continuous    Liters per minute 2      Program Oxygen Prescription   Program Oxygen Prescription Continuous    Liters per minute 2      Intervention   Short Term Goals To learn and understand importance of monitoring SPO2 with pulse oximeter and demonstrate accurate use of the pulse oximeter.;To learn and demonstrate proper pursed lip breathing techniques or other breathing techniques.;To learn and understand  importance of maintaining oxygen saturations>88%;To learn and demonstrate proper use of respiratory medications    Long  Term Goals Verbalizes importance of monitoring SPO2 with pulse oximeter and return demonstration;Maintenance of O2 saturations>88%;Exhibits proper breathing techniques, such as pursed lip breathing or other method taught during program session;Demonstrates proper use of MDI's;Compliance with respiratory medication           Oxygen Re-Evaluation:  Oxygen Re-Evaluation    Row Name 09/07/20 1701 10/08/20 1727 11/05/20 1723         Program Oxygen Prescription   Program Oxygen Prescription Continuous Continuous;E-Tanks Continuous;E-Tanks     Liters per minute 2 2 2            Home Oxygen   Home Oxygen Device E-Tanks;Portable Concentrator;Home Concentrator Home Concentrator;Portable Concentrator;E-Tanks Home Concentrator;Portable Concentrator;E-Tanks     Sleep Oxygen Prescription Continuous Continuous Continuous     Liters per minute 2 2 2      Home Exercise Oxygen Prescription Continuous Continuous Continuous     Liters per minute 2 2 2      Home Resting Oxygen Prescription Continuous Continuous Continuous     Liters per minute 2 2 2      Compliance with Home Oxygen Use Yes Yes Yes           Goals/Expected Outcomes   Short Term Goals To learn and understand importance of monitoring SPO2 with pulse oximeter and demonstrate accurate use of the pulse oximeter.;To learn and demonstrate proper pursed lip breathing techniques or other breathing techniques.;To learn and understand importance of maintaining oxygen saturations>88% To learn and understand importance of maintaining oxygen saturations>88% To learn and understand importance of maintaining oxygen saturations>88%     Long  Term Goals Verbalizes importance of monitoring SPO2 with pulse oximeter and return demonstration;Maintenance of O2 saturations>88%;Exhibits proper breathing techniques, such as pursed lip breathing or  other method taught during program session;Exhibits compliance with exercise, home and travel O2 prescription Maintenance of O2 saturations>88% Maintenance of O2 saturations>88%     Comments Reviewed PLB technique with pt.  Talked about how it works and it's importance in maintaining their exercise saturations. She has a pulse oximeter to check her oxygen saturation at home. Informed her where to get one and explained why it is important to have one. Reviewed that oxygen saturations should be 88 percent and above. Patient has a pulse oximeter at home to check his oxygen. Livvy is continuing to check her O2 at home with her pulse oximeter. She doesn't notice that she drops below 88% at home, but is aware to stop and PLB if she needs to.     Goals/Expected Outcomes Short: Become more profiecient at using PLB.   Long: Become independent at using PLB. Short: monitor oxygen at home with exertion. Long: maintain oxygen saturations above 88 percent independently. Short: Continue compliance with checking O2 and maintaining above 88% Long: Practice PLB technique for continuous maintenance  Oxygen Discharge (Final Oxygen Re-Evaluation):  Oxygen Re-Evaluation - 11/05/20 1723      Program Oxygen Prescription   Program Oxygen Prescription Continuous;E-Tanks    Liters per minute 2      Home Oxygen   Home Oxygen Device Home Concentrator;Portable Concentrator;E-Tanks    Sleep Oxygen Prescription Continuous    Liters per minute 2    Home Exercise Oxygen Prescription Continuous    Liters per minute 2    Home Resting Oxygen Prescription Continuous    Liters per minute 2    Compliance with Home Oxygen Use Yes      Goals/Expected Outcomes   Short Term Goals To learn and understand importance of maintaining oxygen saturations>88%    Long  Term Goals Maintenance of O2 saturations>88%    Comments Leilyn is continuing to check her O2 at home with her pulse oximeter. She doesn't notice that she drops  below 88% at home, but is aware to stop and PLB if she needs to.    Goals/Expected Outcomes Short: Continue compliance with checking O2 and maintaining above 88% Long: Practice PLB technique for continuous maintenance           Initial Exercise Prescription:  Initial Exercise Prescription - 09/03/20 1700      Date of Initial Exercise RX and Referring Provider   Date 09/03/20    Referring Provider Vernard Gambles MD      Oxygen   Oxygen Continuous    Liters 2      Treadmill   MPH 1.3    Grade 0    Minutes 15    METs 2      Recumbant Bike   Level 1    RPM 60    Watts 15    Minutes 15    METs 1.9      NuStep   Level 1    SPM 80    Minutes 15    METs 1.9      Prescription Details   Frequency (times per week) 3    Duration Progress to 30 minutes of continuous aerobic without signs/symptoms of physical distress      Intensity   THRR 40-80% of Max Heartrate 106-136    Ratings of Perceived Exertion 11-13    Perceived Dyspnea 0-4      Progression   Progression Continue to progress workloads to maintain intensity without signs/symptoms of physical distress.      Resistance Training   Training Prescription Yes    Weight Other   Using wrist weights due to severe arthritis   Reps 10-15           Perform Capillary Blood Glucose checks as needed.  Exercise Prescription Changes:  Exercise Prescription Changes    Row Name 09/03/20 1700 09/15/20 1300 09/23/20 1800 09/29/20 1600 10/13/20 1200     Response to Exercise   Blood Pressure (Admit) 126/72 136/72 -- 126/64 126/92   Blood Pressure (Exercise) 136/74 132/70 -- 138/66 126/74   Blood Pressure (Exit) 124/74 116/66 -- 122/68 102/60   Heart Rate (Admit) 77 bpm 80 bpm -- 87 bpm 80 bpm   Heart Rate (Exercise) 89 bpm 88 bpm -- 91 bpm 97 bpm   Heart Rate (Exit) 74 bpm 86 bpm -- 88 bpm 87 bpm   Oxygen Saturation (Admit) 98 % 96 % -- 96 % 94 %   Oxygen Saturation (Exercise) 80 % 93 % -- 95 % 93 %   Oxygen Saturation  (Exit) 96 % 94 % --  96 % 97 %   Rating of Perceived Exertion (Exercise) 13 14 -- 15 15   Perceived Dyspnea (Exercise) 1 -- -- 3 3   Symptoms none none -- SOB SOB   Comments walk test results -- -- -- --   Duration -- Continue with 30 min of aerobic exercise without signs/symptoms of physical distress. -- Continue with 30 min of aerobic exercise without signs/symptoms of physical distress. Continue with 30 min of aerobic exercise without signs/symptoms of physical distress.   Intensity -- THRR unchanged -- THRR unchanged THRR unchanged     Progression   Progression -- Continue to progress workloads to maintain intensity without signs/symptoms of physical distress. -- Continue to progress workloads to maintain intensity without signs/symptoms of physical distress. Continue to progress workloads to maintain intensity without signs/symptoms of physical distress.   Average METs -- 2.57 -- 2.45 2.6     Resistance Training   Training Prescription -- Yes -- Yes Yes   Weight -- Other -- 1 lb 1 lb   Reps -- 10-15 -- 10-15 10-15     Interval Training   Interval Training -- -- -- No No     Oxygen   Oxygen -- Continuous -- -- --   Liters -- 2 -- -- --     Recumbant Bike   Level -- 1 -- 2 2   Watts -- -- -- 12 --   Minutes -- 15 -- 15 15   METs -- 3.74 -- 2.14 --     NuStep   Level -- 1 -- 2 2   SPM -- 80 -- -- --   Minutes -- 15 -- 15 15   METs -- 1.4 -- 2.2 2.3     Home Exercise Plan   Plans to continue exercise at -- -- Home (comment)  Recumbant bike, walking Home (comment)  Recumbant bike, walking Home (comment)  Recumbant bike, walking   Frequency -- -- Add 2 additional days to program exercise sessions. Add 2 additional days to program exercise sessions. Add 2 additional days to program exercise sessions.   Initial Home Exercises Provided -- -- 09/23/20 09/23/20 09/23/20   Row Name 10/29/20 1200 11/10/20 1400           Response to Exercise   Blood Pressure (Admit) 116/62 102/60       Blood Pressure (Exercise) 102/60 128/72      Blood Pressure (Exit) 112/62 112/82      Heart Rate (Admit) 80 bpm 71 bpm      Heart Rate (Exercise) 90 bpm 89 bpm      Heart Rate (Exit) 83 bpm 79 bpm      Oxygen Saturation (Admit) 98 % 94 %      Oxygen Saturation (Exercise) 91 % 92 %      Oxygen Saturation (Exit) 99 % 97 %      Rating of Perceived Exertion (Exercise) 17 13      Perceived Dyspnea (Exercise) 3 3      Symptoms SOB SOB      Duration Continue with 30 min of aerobic exercise without signs/symptoms of physical distress. Continue with 30 min of aerobic exercise without signs/symptoms of physical distress.      Intensity THRR unchanged THRR unchanged             Progression   Progression Continue to progress workloads to maintain intensity without signs/symptoms of physical distress. Continue to progress workloads to maintain intensity without signs/symptoms of physical distress.  Average METs 2.52 2.42             Resistance Training   Training Prescription Yes --      Weight 5 lb --      Reps 10-15 --             Interval Training   Interval Training No --             Oxygen   Oxygen Continuous --      Liters 2 --             Treadmill   MPH 1.5 --      Grade 0.5 --      Minutes 15 --      METs 2.25 --             Recumbant Bike   Level 2 --      Minutes 15 --      METs 2.5 --             NuStep   Level 2 --      Minutes 15 --      METs 2.8 --             T5 Nustep   Level 1 --      Minutes 15 --      METs 1.9 --             Home Exercise Plan   Plans to continue exercise at Home (comment)  Recumbant bike, walking --      Frequency Add 2 additional days to program exercise sessions. --      Initial Home Exercises Provided 09/23/20 --             Exercise Comments:  Exercise Comments    Row Name 09/07/20 1700           Exercise Comments First full day of exercise!  Patient was oriented to gym and equipment including functions,  settings, policies, and procedures.  Patient's individual exercise prescription and treatment plan were reviewed.  All starting workloads were established based on the results of the 6 minute walk test done at initial orientation visit.  The plan for exercise progression was also introduced and progression will be customized based on patient's performance and goals.              Exercise Goals and Review:  Exercise Goals    Row Name 09/03/20 1714             Exercise Goals   Increase Physical Activity Yes       Intervention Provide advice, education, support and counseling about physical activity/exercise needs.;Develop an individualized exercise prescription for aerobic and resistive training based on initial evaluation findings, risk stratification, comorbidities and participant's personal goals.       Expected Outcomes Short Term: Attend rehab on a regular basis to increase amount of physical activity.;Long Term: Add in home exercise to make exercise part of routine and to increase amount of physical activity.;Long Term: Exercising regularly at least 3-5 days a week.       Increase Strength and Stamina Yes       Intervention Provide advice, education, support and counseling about physical activity/exercise needs.;Develop an individualized exercise prescription for aerobic and resistive training based on initial evaluation findings, risk stratification, comorbidities and participant's personal goals.       Expected Outcomes Short Term: Increase workloads from initial exercise prescription for resistance, speed, and METs.;Short Term:  Perform resistance training exercises routinely during rehab and add in resistance training at home;Long Term: Improve cardiorespiratory fitness, muscular endurance and strength as measured by increased METs and functional capacity (6MWT)       Able to understand and use rate of perceived exertion (RPE) scale Yes       Intervention Provide education and  explanation on how to use RPE scale       Expected Outcomes Short Term: Able to use RPE daily in rehab to express subjective intensity level;Long Term:  Able to use RPE to guide intensity level when exercising independently       Able to understand and use Dyspnea scale Yes       Intervention Provide education and explanation on how to use Dyspnea scale       Expected Outcomes Short Term: Able to use Dyspnea scale daily in rehab to express subjective sense of shortness of breath during exertion;Long Term: Able to use Dyspnea scale to guide intensity level when exercising independently       Knowledge and understanding of Target Heart Rate Range (THRR) Yes       Intervention Provide education and explanation of THRR including how the numbers were predicted and where they are located for reference       Expected Outcomes Short Term: Able to state/look up THRR;Short Term: Able to use daily as guideline for intensity in rehab;Long Term: Able to use THRR to govern intensity when exercising independently       Able to check pulse independently Yes       Intervention Provide education and demonstration on how to check pulse in carotid and radial arteries.;Review the importance of being able to check your own pulse for safety during independent exercise       Expected Outcomes Short Term: Able to explain why pulse checking is important during independent exercise;Long Term: Able to check pulse independently and accurately       Understanding of Exercise Prescription Yes       Intervention Provide education, explanation, and written materials on patient's individual exercise prescription       Expected Outcomes Short Term: Able to explain program exercise prescription;Long Term: Able to explain home exercise prescription to exercise independently              Exercise Goals Re-Evaluation :  Exercise Goals Re-Evaluation    Row Name 09/07/20 1700 09/15/20 1342 09/23/20 1824 09/29/20 1608 10/13/20 1217      Exercise Goal Re-Evaluation   Exercise Goals Review Able to understand and use rate of perceived exertion (RPE) scale;Knowledge and understanding of Target Heart Rate Range (THRR);Increase Physical Activity;Understanding of Exercise Prescription;Increase Strength and Stamina;Able to understand and use Dyspnea scale;Able to check pulse independently Increase Physical Activity;Increase Strength and Stamina Increase Physical Activity;Increase Strength and Stamina Increase Physical Activity;Increase Strength and Stamina;Understanding of Exercise Prescription --   Comments Reviewed RPE and dyspnea scales, THR and program prescription with pt today.  Pt voiced understanding and was given a copy of goals to take home. Zanita is tolerating exercise well.  She got some  1 lb weights she can use - she cant hold regular DB due to arthritis.  We will monitor progress. Reviewed home exercise with pt today.  Pt plans to walk and use her recumbant bike for exercise.  Reviewed THR, pulse, RPE, sign and symptoms, pulse oximetery and when to call 911 or MD.  Also discussed weather considerations and indoor options.  Pt voiced understanding. Rosann Auerbach  is doing well in rehab.  She holding steady with her METs and should be ready to start to increase workloads with her improved attendance.  We will continue to monitor her progress. ima attends consistently.  Oxygen stays in the 90s. Staff will encourage increasing workloads on T4.   Expected Outcomes Short: Use RPE daily to regulate intensity. Long: Follow program prescription in THR. Short: attend consistently Long:  build stamina -- Short: Increase workloads Long: Continue to improve stamina. Short: Increase workloads Long: Continue to improve stamina.   [place] Name 10/29/20 1250 11/05/20 1728 11/10/20 1427         Exercise Goal Re-Evaluation   Exercise Goals Review Increase Physical Activity;Increase Strength and Stamina;Understanding of Exercise Prescription Increase  Physical Activity;Increase Strength and Stamina;Understanding of Exercise Prescription Increase Physical Activity;Increase Strength and Stamina     Comments Jayani has been doing well in rehab.  She is now using 5 lb hand weights!!  She is also up to 2.8 METs on the [place].  We will continue to monitor her progress. Akiera is doing well with home exercise. She is continuing to exercise on her recumbant bike at home for about 15-20 minutes. She goes on her bike 3 times per week at home, on her off days from rehab. We discussed increasing that duration to minimum 30 minutes. She is interested in joining the [place] when she is close to graduating. Airiana attends consistently and works at [employer].  She wil have her post 6MWT in a couple sessions.  Staff will monitor progress.     Expected Outcomes Short: Continue to improve workloads  Long: Continue to improve stamina Short: Increase duration on home exercise sessions Long: Continue to work at appropriate exercise prescription at home Short: continue to attend consistently Long: complete LW program            Discharge Exercise Prescription (Final Exercise Prescription Changes):  Exercise Prescription Changes - 11/10/20 1400      Response to Exercise   Blood Pressure (Admit) 102/60    Blood Pressure (Exercise) 128/72    Blood Pressure (Exit) 112/82    Heart Rate (Admit) 71 bpm    Heart Rate (Exercise) 89 bpm    Heart Rate (Exit) 79 bpm    Oxygen Saturation (Admit) 94 %    Oxygen Saturation (Exercise) 92 %    Oxygen Saturation (Exit) 97 %    Rating of Perceived Exertion (Exercise) 13    Perceived Dyspnea (Exercise) 3    Symptoms SOB    Duration Continue with 30 min of aerobic exercise without signs/symptoms of physical distress.    Intensity THRR unchanged      Progression   Progression Continue to progress workloads to maintain intensity without signs/symptoms of physical distress.    Average METs 2.42           Nutrition:  Target  Goals: Understanding of nutrition guidelines, daily intake of sodium <1559m, cholesterol <2014m calories 30% from fat and 7% or less from saturated fats, daily to have 5 or more servings of fruits and vegetables.  Education: All About Nutrition: -Group instruction provided by verbal, written material, interactive activities, discussions, models, and posters to present general guidelines for heart healthy nutrition including fat, fiber, MyPlate, the role of sodium in heart healthy nutrition, utilization of the nutrition label, and utilization of this knowledge for meal planning. Follow up email sent as well. Written material given at graduation. Flowsheet Row [facility]  Date 09/09/20  Educator [place]  Instruction Review Code 1- Verbalizes Understanding      Biometrics:  Pre Biometrics - 09/07/20 1708      Pre Biometrics   Single Leg Stand 30 seconds            Nutrition Therapy Plan and Nutrition Goals:  Nutrition Therapy & Goals - 09/16/20 1726      Nutrition Therapy   Diet High kcal, high protein    Protein (specify units) 55g    Fiber 20 grams    Whole Grain Foods 2 servings   eats very little   Saturated Fats 12 max. grams    Fruits and Vegetables 3 servings/day   eats very little   Sodium 1.5 grams      Personal Nutrition Goals   Nutrition Goal ST: add two nutritionally dense drinks in between meals like soymilk and boost, add fat like butter and oil to meals she is already eating LT: meet energy and protein needs, gain weight    Comments She also has Ulcerative Colitis - on medication that is working well for her. She reports not having trigger foods - she has trouble with higher fat foods like whole fat dairy products. No shortness of breath during or after meals. Meals about the size of fist - half chicken, potato and other vegetable. B: mini cinnamon waffles (a couple of squares at home and a couple at work before  lunch) L: [employer] with [place] and mayo and pickle (half) or lowfat yogurt with orange  D: 2 oz of chicken (fish: usually tilapia, 1-2x/week), potato and other vegetable like sliced pickeled beets or healthy choice and lean cuisines due to the smaller portions they provide. S: mini carrots with ranch dip  Drinks: water - during meals. Discussed pulmonary MNT. Her weight today was 100.7 lbs.      Intervention Plan   Intervention Prescribe, educate and counsel regarding individualized specific dietary modifications aiming towards targeted core components such as weight, hypertension, lipid management, diabetes, heart failure and other comorbidities.;Nutrition handout(s) given to patient.    Expected Outcomes Short Term Goal: Understand basic principles of dietary content, such as calories, fat, sodium, cholesterol and nutrients.;Short Term Goal: A plan has been developed with personal nutrition goals set during dietitian appointment.;Long Term Goal: Adherence to prescribed nutrition plan.           Nutrition Assessments:  MEDIFICTS Score Key:  ?70 Need to make dietary changes   40-70 Heart Healthy Diet  ? 40 Therapeutic Level Cholesterol Diet  Flowsheet Row Pulmonary Rehab from 09/03/2020 in [facility] Cardiac and Pulmonary Rehab  Picture Your Plate Total Score on Admission 63     Picture Your Plate Scores:  <96 Unhealthy dietary pattern with much room for improvement.  41-50 Dietary pattern unlikely to meet recommendations for good health and room for improvement.  51-60 More healthful dietary pattern, with some room for improvement.   >60 Healthy dietary pattern, although there may be some specific behaviors that could be improved.   Nutrition Goals Re-Evaluation:  Nutrition Goals Re-Evaluation    Row Name 09/23/20 1806 10/08/20 1732 10/14/20 1804 10/21/20 1641 11/05/20 1753     Goals   Current Weight 101 lb 1.6 oz (45.9 kg) 101 lb (45.8 kg) 100 lb 12.8 oz (45.7 kg) 100 lb 8 oz  (45.6 kg) --   Nutrition Goal -- Gain weight Short: intake more protein and continue snacks. drink ensure optimally 3 times a day,  at least 2. Long: gain 5 pounds. -- Short: intake more protein and continue snacks. drink ensure optimally 3 times a day, at least 2. Long: gain 5 pounds.   Comment Previous weight: 100.7lb. Spoke with daughter about weight concerns. Yenifer wants to try to gain more weight. She has been doing more snaking throughout the day. She is happy that she has gained a few pounds since she started the program and wants to continue. Discussed drinking ensure optimally 3 times a day, at least 2 - she is only drinking one right now. Discussed adding fat to meals such as avocado, oil, and butter. -- [place] is working on [employer] but has had no luck so far. She is only taking in 1 ensure as it bothers her stomach. She is looking to increase more protein in her diet, espcially chicken. We talked about other useful  exmaples to incorporate in.   Expected Outcome -- Short: intake more protein and continue snacks. Long: gain 5 pounds. Short: intake more protein and continue snacks. Long: gain 5 pounds. -- Short: Incorporate more protein rich snacks Long: Gain weight optimal for health   [place] Name 11/09/20 1738             Goals   Nutrition Goal Short: intake more protein and continue snacks. drink ensure optimally 3 times a day, at least 2. - try out some lactose free options like ensure plant based protein or ensure clear or [employer] or plant-based orgain or Boost Long: gain 5 pounds.       Comment Abiha is working on [employer] but has had no luck so far. She is only taking in 1 ensure as it bothers her stomach. Discussed how this may be due to the lactose -suggested other types of supplements to try - if not we can try to make a protein drink  recipe that works for her needs. She is trying to eat more protein. She is not meeting her needs through diet alone, continue trying.        Expected Outcome Short: intake more protein and continue snacks. drink ensure optimally 3 times a day, at least 2. - try out some lactose free options like ensure plant based protein or ensure clear or [employer] Long: gain 5 pounds.              Nutrition Goals Discharge (Final Nutrition Goals Re-Evaluation):  Nutrition Goals Re-Evaluation - 11/09/20 1738      Goals   Nutrition Goal Short: intake more protein and continue snacks. drink ensure optimally 3 times a day, at least 2. - try out some lactose free options like ensure plant based protein or ensure clear or [employer] or plant-based orgain or Boost Long: gain 5 pounds.    Comment Ieasha is working on [employer] but has had no luck so far. She is only taking in 1 ensure as it bothers her stomach. Discussed how this may be due to the lactose -suggested other types of supplements to try - if not we can try to make a protein drink  recipe that works for her needs. She is trying to eat more protein. She is not meeting her needs through diet alone, continue trying.    Expected Outcome Short: intake more protein and continue snacks. drink ensure optimally 3 times a day, at least 2. - try out some lactose free options like ensure plant based protein or ensure clear or [employer] Long: gain 5 pounds.  Psychosocial: Target Goals: Acknowledge presence or absence of significant depression and/or stress, maximize coping skills, provide positive support system. Participant is able to verbalize types and ability to use techniques and skills needed for reducing stress and depression.   Education: Stress, Anxiety, and Depression - Group verbal and visual presentation to define topics covered.  Reviews how body is impacted by stress, anxiety, and depression.  Also discusses healthy ways to reduce stress and to treat/manage anxiety and depression.  Written material given at graduation. Flowsheet Row Pulmonary Rehab from 11/11/2020 in [facility]  Cardiac and Pulmonary Rehab  Date 10/07/20  Educator [facility]  Instruction Review Code 1- [employer] Understanding      Education: Sleep Hygiene -Provides group verbal and written instruction about how sleep can affect your health.  Define sleep hygiene, discuss sleep cycles and impact of sleep habits. Review good sleep hygiene tips.    Initial Review & Psychosocial Screening:  Initial Psych Review & Screening - 08/31/20 1536      Initial Review   Current issues with Current Stress Concerns;Current Sleep Concerns    Source of Stress Concerns Unable to participate in former interests or hobbies;Unable to perform yard/household activities;Chronic Illness      Family Dynamics   Good Support System? Yes   family     Barriers   Psychosocial barriers to participate in program There are no identifiable barriers or psychosocial needs.;The patient should benefit from training in stress management and relaxation.      Screening Interventions   Interventions Encouraged to exercise;Provide feedback about the scores to participant;To provide support and resources with identified psychosocial needs    Expected Outcomes Short Term goal: Utilizing psychosocial counselor, staff and physician to assist with identification of specific Stressors or current issues interfering with healing process. Setting desired goal for each stressor or current issue identified.;Long Term Goal: Stressors or current issues are controlled or eliminated.;Short Term goal: Identification and review with participant of any Quality of Life or Depression concerns found by scoring the questionnaire.;Long Term goal: The participant improves quality of Life and PHQ9 Scores as seen by post scores and/or verbalization of changes           Quality of Life Scores:  Scores of 19 and below usually indicate a poorer quality of life in these areas.  A difference of  2-3 points is a clinically meaningful difference.  A difference of 2-3 points  in the total score of the Quality of Life Index has been associated with significant improvement in overall quality of life, self-image, physical symptoms, and general health in studies assessing change in quality of life.  PHQ-9: Recent Review Flowsheet Data    Depression screen [facility] 2/9 11/04/2020 10/28/2020 09/30/2020 09/03/2020 01/03/2020   Decreased Interest 0 1 0 0 2   Down, Depressed, Hopeless 0 1 0 3  1   PHQ - 2 Score 0 2 0 3 3   Altered sleeping - 2 1 2 2    Tired, decreased energy - 2 1 3 2    Change in appetite - 2 1 2  2    Feeling bad or failure about yourself  - 2 0 2 0   Trouble concentrating - 1 0 1 0   Moving slowly or fidgety/restless - 0 0 0 0   Suicidal thoughts - 0 0 0 0   PHQ-9 Score - 11 3 13 9    Difficult doing work/chores - Somewhat difficult Not difficult at all Somewhat difficult Very difficult  Interpretation of Total Score  Total Score Depression Severity:  1-4 = Minimal depression, 5-9 = Mild depression, 10-14 = Moderate depression, 15-19 = Moderately severe depression, 20-27 = Severe depression   Psychosocial Evaluation and Intervention:  Psychosocial Evaluation - 08/31/20 1547      Psychosocial Evaluation & Interventions   Interventions Stress management education;Relaxation education;Encouraged to exercise with the program and follow exercise prescription    Comments Ms. Jhoana states she is very excited to start Pulmonary Rehab to help with her shortness of breath. She has also lost weight unintentionally and is looking forward to speaking with the dietician. She is currently working and expresses some stress related to that as well as stress concerning her chronic breathing issues. She does have a good support system that she cherishes. She has had to increase her oxygen use and is trialing different inhalers in hopes that will help with her constant feeling short of breath. She wants to impore her quality of life and gain some stamina.    Expected Outcomes  Short: attend Pulmonary Rehab for education and exercise. Long: develop and maintain positive self care habits.    Continue Psychosocial Services  Follow up required by staff           Psychosocial Re-Evaluation:  Psychosocial Re-Evaluation    [place] Name 10/08/20 1733 11/05/20 1736           Psychosocial Re-Evaluation   Current issues with Current Stress Concerns;Current Sleep Concerns Current Stress Concerns;Current Sleep Concerns      Comments Kareema wears oxygen at night and has got used to it. She is able to sleep better now that she is used to her oxygen. She is not as anxious now that she is able to breath a little better. Brylei is doing well mentally. She is currently stressed about retiring at the end of this month and unsure of her next steps. Though, she is also excited to have more free time to focus on her health. She is not interested in any oral medications to help with anxiety but overall she feels her SOB has been better to an extent. Sleep has been better, she has some nights that she is uncomfortable due to her oxygen, but she is getting used to it more.      Expected Outcomes Short: Continue to exercise regularly to support mental health and notify staff of any changes. Long: maintain mental health and well being through teaching of rehab or prescribed medications independently. Short: Continue attendance with pulmonary rehab Long: Continue to maintain positive attitude utilizing exercise      Interventions Encouraged to attend Pulmonary Rehabilitation for the exercise Encouraged to attend Pulmonary Rehabilitation for the exercise      Continue Psychosocial Services  Follow up required by staff Follow up required by staff             Psychosocial Discharge (Final Psychosocial Re-Evaluation):  Psychosocial Re-Evaluation - 11/05/20 1736      Psychosocial Re-Evaluation   Current issues with Current Stress Concerns;Current Sleep Concerns    Comments Shaunte is doing well  mentally. She is currently stressed about retiring at the end of this month and unsure of her next steps. Though, she is also excited to have more free time to focus on her health. She is not interested in any oral medications to help with anxiety but overall she feels her SOB has been better to an extent. Sleep has been better, she has some nights that she is uncomfortable  due to her oxygen, but she is getting used to it more.    Expected Outcomes Short: Continue attendance with pulmonary rehab Long: Continue to maintain positive attitude utilizing exercise    Interventions Encouraged to attend Pulmonary Rehabilitation for the exercise    Continue Psychosocial Services  Follow up required by staff           Education: Education Goals: Education classes will be provided on a weekly basis, covering required topics. Participant will state understanding/return demonstration of topics presented.  Learning Barriers/Preferences:  Learning Barriers/Preferences - 08/31/20 1532      Learning Barriers/Preferences   Learning Barriers None    Learning Preferences None           General Pulmonary Education Topics:  Infection Prevention: - Provides verbal and written material to individual with discussion of infection control including proper hand washing and proper equipment cleaning during exercise session. Flowsheet Row Pulmonary Rehab from 11/11/2020 in [facility] Cardiac and Pulmonary Rehab  Education need identified 09/03/20  Date 09/03/20  Educator [place]  Instruction Review Code 1- Verbalizes Understanding      Falls Prevention: - Provides verbal and written material to individual with discussion of falls prevention and safety. Flowsheet Row Pulmonary Rehab from 11/11/2020 in [facility] Cardiac and Pulmonary Rehab  Education need identified 09/03/20  Date 09/03/20  Educator [place]  Instruction Review Code 1- Verbalizes Understanding      Chronic Lung Disease Review: - Group verbal instruction with  posters, models, PowerPoint presentations and videos,  to review new updates, new respiratory medications, new advancements in procedures and treatments. Providing information on websites and "800" numbers for continued self-education. Includes information about supplement oxygen, available portable oxygen systems, continuous and intermittent flow rates, oxygen safety, concentrators, and Medicare reimbursement for oxygen. Explanation of Pulmonary Drugs, including class, frequency, complications, importance of spacers, rinsing mouth after steroid MDI's, and proper cleaning methods for nebulizers. Review of basic lung anatomy and physiology related to function, structure, and complications of lung disease. Review of risk factors. Discussion about methods for diagnosing sleep apnea and types of masks and machines for OSA. Includes a review of the use of types of environmental controls: home humidity, furnaces, filters, dust mite/pet prevention, HEPA vacuums. Discussion about weather changes, air quality and the benefits of nasal washing. Instruction on Warning signs, infection symptoms, calling MD promptly, preventive modes, and value of vaccinations. Review of effective airway clearance, coughing and/or vibration techniques. Emphasizing that all should Create an Action Plan. Written material given at graduation. Flowsheet Row Pulmonary Rehab from 11/11/2020 in [facility] Cardiac and Pulmonary Rehab  Date 09/30/20  Educator [facility]  Instruction Review Code 1- Verbalizes Understanding      AED/CPR: - Group verbal and written instruction with the use of models to demonstrate the basic use of the AED with the basic ABC's of resuscitation.    Anatomy and Cardiac Procedures: - Group verbal and visual presentation and models provide information about basic cardiac anatomy and function. Reviews the testing methods done to diagnose heart disease and the outcomes of the test results. Describes the treatment choices: Medical  Management, Angioplasty, or Coronary Bypass Surgery for treating various heart conditions including Myocardial Infarction, Angina, Valve Disease, and Cardiac Arrhythmias.  Written material given at graduation. Flowsheet Row Pulmonary Rehab from 11/11/2020 in [facility] Cardiac and Pulmonary Rehab  Date 10/28/20  Educator [facility]  Instruction Review Code 1- Verbalizes Understanding      Medication Safety: - Group verbal and visual instruction to review commonly prescribed  medications for heart and lung disease. Reviews the medication, class of the drug, and side effects. Includes the steps to properly store meds and maintain the prescription regimen.  Written material given at graduation. Flowsheet Row Pulmonary Rehab from 11/11/2020 in [facility] Cardiac and Pulmonary Rehab  Date 09/16/20  Educator [facility]  Instruction Review Code 1- Verbalizes Understanding      Other: -Provides group and verbal instruction on various topics (see comments)   Knowledge Questionnaire Score:  Knowledge Questionnaire Score - 09/03/20 1715      Knowledge Questionnaire Score   Pre Score 16/18: Nutrition, medications            Core Components/Risk Factors/Patient Goals at Admission:  Personal Goals and Risk Factors at Admission - 09/03/20 1714      Core Components/Risk Factors/Patient Goals on Admission    Weight Management Yes;Weight Gain    Intervention Weight Management: Provide education and appropriate resources to help participant work on and attain dietary goals.;Weight Management: Develop a combined nutrition and exercise program designed to reach desired caloric intake, while maintaining appropriate intake of nutrient and fiber, sodium and fats, and appropriate energy expenditure required for the weight goal.    Admit Weight 100 lb 8 oz (45.6 kg)    Goal Weight: Short Term 105 lb (47.6 kg)    Goal Weight: Long Term 110 lb (49.9 kg)    Expected Outcomes Short Term: Continue to assess and modify interventions until  short term weight is achieved;Long Term: Adherence to nutrition and physical activity/exercise program aimed toward attainment of established weight goal;Weight Gain: Understanding of general recommendations for a high calorie, high protein meal plan that promotes weight gain by distributing calorie intake throughout the day with the consumption for 4-5 meals, snacks, and/or supplements;Understanding of distribution of calorie intake throughout the day with the consumption of 4-5 meals/snacks;Understanding recommendations for meals to include 15-35% energy as protein, 25-35% energy from fat, 35-60% energy from carbohydrates, less than 283m of dietary cholesterol, 20-35 gm of total fiber daily    Improve shortness of breath with ADL's Yes    Intervention Provide education, individualized exercise plan and daily activity instruction to help decrease symptoms of SOB with activities of daily living.    Expected Outcomes Short Term: Improve cardiorespiratory fitness to achieve a reduction of symptoms when performing ADLs;Long Term: Be able to perform more ADLs without symptoms or delay the onset of symptoms    Hypertension Yes    Intervention Monitor prescription use compliance.;Provide education on lifestyle modifcations including regular physical activity/exercise, weight management, moderate sodium restriction and increased consumption of fresh fruit, vegetables, and low fat dairy, alcohol moderation, and smoking cessation.    Expected Outcomes Short Term: Continued assessment and intervention until BP is < 140/985mHG in hypertensive participants. < 130/8020mG in hypertensive participants with diabetes, heart failure or chronic kidney disease.;Long Term: Maintenance of blood pressure at goal levels.           Education:Diabetes - Individual verbal and written instruction to review signs/symptoms of diabetes, desired ranges of glucose level fasting, after meals and with exercise. Acknowledge that pre  and post exercise glucose checks will be done for 3 sessions at entry of program.   Know Your Numbers and Heart Failure: - Group verbal and visual instruction to discuss disease risk factors for cardiac and pulmonary disease and treatment options.  Reviews associated critical values for Overweight/Obesity, Hypertension, Cholesterol, and Diabetes.  Discusses basics of heart failure: signs/symptoms and treatments.  Introduces Heart  Failure Zone chart for action plan for heart failure.  Written material given at graduation. Flowsheet Row Pulmonary Rehab from 11/11/2020 in [facility] Cardiac and Pulmonary Rehab  Date 09/23/20  Educator [facility]  Instruction Review Code 1- Verbalizes Understanding      Core Components/Risk Factors/Patient Goals Review:   Goals and Risk Factor Review    Row Name 10/08/20 1731 11/05/20 1746           Core Components/Risk Factors/Patient Goals Review   Personal Goals Review Improve shortness of breath with ADL's Weight Management/Obesity;Improve shortness of breath with ADL's      Review Spoke to patient about their shortness of breath and what they can do to improve. Patient has been informed of breathing techniques when starting the program. Patient is informed to tell staff if they have had any med changes and that certain meds they are taking or not taking can be causing shortness of breath. Danyiel has been monitoring her weight at home and has been staying stable, she still wants to gain weight but is hard for her due to her ulcer. colitis. She has been able to do more house cleaning without having any problems. She gets around [facility] and is looking forward to having more free time when she retires.      Expected Outcomes Short: Attend [facility] regularly to improve shortness of breath with ADL's. Long: maintain independence with ADL's Short: Continue exercise to help improve ADL Long: Continue to manage lifestyle risk factors             Core Components/Risk Factors/Patient  Goals at Discharge (Final Review):   Goals and Risk Factor Review - 11/05/20 1746      Core Components/Risk Factors/Patient Goals Review   Personal Goals Review Weight Management/Obesity;Improve shortness of breath with ADL's    Review Cena has been monitoring her weight at home and has been staying stable, she still wants to gain weight but is hard for her due to her ulcer. colitis. She has been able to do more house cleaning without having any problems. She gets around [facility] and is looking forward to having more free time when she retires.    Expected Outcomes Short: Continue exercise to help improve ADL Long: Continue to manage lifestyle risk factors           ITP Comments:  ITP Comments    Row Name 08/31/20 1544 09/03/20 1701 09/07/20 1659 09/23/20 0753 10/21/20 1006   ITP Comments Initial telephone orientation completed. Diagnosis can be found in [facility] on 1/13. EP orientation scheduled for Thursday 3/3 at 3pm. Completed 6MWT and gym orientation. Initial ITP created and sent for review to Dr. Emily Filbert, Medical Director. First full day of exercise!  Patient was oriented to gym and equipment including functions, settings, policies, and procedures.  Patient's individual exercise prescription and treatment plan were reviewed.  All starting workloads were established based on the results of the 6 minute walk test done at initial orientation visit.  The plan for exercise progression was also introduced and progression will be customized based on patient's performance and goals. 30 Day review completed. Medical Director ITP review done, changes made as directed, and signed approval by Medical Director.  New to program 30 Day review completed. Medical Director ITP review done, changes made as directed, and signed approval by Medical Director.   Ritchie Name 11/18/20 0712           ITP Comments 30 Day review completed. Medical Director ITP review done,  changes made as directed, and signed approval by  Medical Director.              Comments:

## 2020-11-18 NOTE — Telephone Encounter (Signed)
Spoke to patient, who is requesting for 30 day supply of ventolin to be sent to local pharmacy and 90 day supply to [facility].  Rx has been sent to preferred pharmacy.  Patient is aware and voiced her understanding.  Nothing further needed at this time.

## 2020-11-19 ENCOUNTER — Encounter: Payer: Medicare HMO | Admitting: *Deleted

## 2020-11-19 DIAGNOSIS — I272 Pulmonary hypertension, unspecified: Secondary | ICD-10-CM | POA: Diagnosis not present

## 2020-11-19 DIAGNOSIS — J449 Chronic obstructive pulmonary disease, unspecified: Secondary | ICD-10-CM

## 2020-11-19 NOTE — Progress Notes (Signed)
Daily Session Note  Patient Details  Name: Carrie Hendricks MRN: [id number] Date of Birth: 1951-04-01 Referring Provider:   Flowsheet Row Pulmonary Rehab from 09/03/2020 in [facility] Cardiac and Pulmonary Rehab  Referring Provider Vernard Gambles MD      Encounter Date: 11/19/2020  Check In:  Session Check In - 11/19/20 1724      Check-In   Supervising physician immediately available to respond to emergencies See telemetry face sheet for immediately available ER MD    Location [facility]-Cardiac & Pulmonary Rehab    Staff Present Renita Papa, RN BSN;Joseph Hood RCP,RRT,BSRT;Amanda Oletta Darter, [place], ACSM CEP, Exercise Physiologist    Virtual Visit No    Medication changes reported     No    Fall or balance concerns reported    No    Warm-up and Cool-down Performed on first and last piece of equipment    Resistance Training Performed Yes    VAD Patient? No    PAD/SET Patient? No      Pain Assessment   Currently in Pain? No/denies              Social History   Tobacco Use  Smoking Status Former Smoker  . Packs/day: 1.25  . Years: 30.00  . Pack years: 37.50  . Quit date: 2006  . Years since quitting: 16.3  Smokeless Tobacco Never Used  Tobacco Comment   Quit in 8/07    Goals Met:  Independence with exercise equipment Exercise tolerated well No report of cardiac concerns or symptoms Strength training completed today  Goals Unmet:  Not Applicable  Comments: Pt able to follow exercise prescription today without complaint.  Will continue to monitor for progression.    Dr. Emily Filbert is Medical Director for [place] and [facility] Pulmonary Rehabilitation.

## 2020-11-21 LAB — COMPREHENSIVE METABOLIC PANEL
ALT: 33 IU/L — ABNORMAL HIGH (ref 0–32)
AST: 29 IU/L (ref 0–40)
Albumin/Globulin Ratio: 1.7 (ref 1.2–2.2)
Albumin: 4.5 g/dL (ref 3.8–4.8)
Alkaline Phosphatase: 56 IU/L (ref 44–121)
BUN/Creatinine Ratio: 13 (ref 12–28)
BUN: 9 mg/dL (ref 8–27)
Bilirubin Total: 0.7 mg/dL (ref 0.0–1.2)
CO2: 27 mmol/L (ref 20–29)
Calcium: 10.1 mg/dL (ref 8.7–10.3)
Chloride: 95 mmol/L — ABNORMAL LOW (ref 96–106)
Creatinine, Ser: 0.72 mg/dL (ref 0.57–1.00)
Globulin, Total: 2.6 g/dL (ref 1.5–4.5)
Glucose: 98 mg/dL (ref 65–99)
Potassium: 3.6 mmol/L (ref 3.5–5.2)
Sodium: 140 mmol/L (ref 134–144)
Total Protein: 7.1 g/dL (ref 6.0–8.5)
eGFR: 90 mL/min/{1.73_m2} (ref >59)

## 2020-11-21 LAB — CBC WITH DIFFERENTIAL/PLATELET
Basophils Absolute: 0 10*3/uL (ref 0.0–0.2)
Basos: 1 %
EOS (ABSOLUTE): 0 10*3/uL (ref 0.0–0.4)
Eos: 1 %
Hematocrit: 42.5 % (ref 34.0–46.6)
Hemoglobin: 15 g/dL (ref 11.1–15.9)
Immature Grans (Abs): 0 10*3/uL (ref 0.0–0.1)
Immature Granulocytes: 0 %
Lymphocytes Absolute: 1.5 10*3/uL (ref 0.7–3.1)
Lymphs: 26 %
MCH: 33.3 pg — ABNORMAL HIGH (ref 26.6–33.0)
MCHC: 35.3 g/dL (ref 31.5–35.7)
MCV: 94 fL (ref 79–97)
Monocytes Absolute: 0.6 10*3/uL (ref 0.1–0.9)
Monocytes: 10 %
Neutrophils Absolute: 3.5 10*3/uL (ref 1.4–7.0)
Neutrophils: 62 %
Platelets: 206 10*3/uL (ref 150–450)
RBC: 4.51 x10E6/uL (ref 3.77–5.28)
RDW: 11.8 % (ref 11.7–15.4)
WBC: 5.6 10*3/uL (ref 3.4–10.8)

## 2020-11-21 LAB — VITAMIN D 1,25 DIHYDROXY
Vitamin D 1, 25 (OH)2 Total: 15 pg/mL — ABNORMAL LOW
Vitamin D2 1, 25 (OH)2: <10 pg/mL
Vitamin D3 1, 25 (OH)2: 15 pg/mL

## 2020-11-21 LAB — C-REACTIVE PROTEIN: CRP: 3 mg/L (ref 0–10)

## 2020-11-23 ENCOUNTER — Encounter: Payer: Medicare HMO | Admitting: *Deleted

## 2020-11-23 ENCOUNTER — Telehealth: Payer: Self-pay

## 2020-11-23 ENCOUNTER — Other Ambulatory Visit: Payer: Self-pay

## 2020-11-23 DIAGNOSIS — J449 Chronic obstructive pulmonary disease, unspecified: Secondary | ICD-10-CM | POA: Diagnosis not present

## 2020-11-23 DIAGNOSIS — I272 Pulmonary hypertension, unspecified: Secondary | ICD-10-CM | POA: Diagnosis not present

## 2020-11-23 NOTE — Progress Notes (Signed)
Daily Session Note  Patient Details  Name: Carrie Hendricks MRN: [id number] Date of Birth: 26-Jan-1951 Referring Provider:   Flowsheet Row Pulmonary Rehab from 09/03/2020 in [facility] Cardiac and Pulmonary Rehab  Referring Provider Vernard Gambles MD      Encounter Date: 11/23/2020  Check In:  Session Check In - 11/23/20 1719      Check-In   Supervising physician immediately available to respond to emergencies See telemetry face sheet for immediately available ER MD    Location [facility]-Cardiac & Pulmonary Rehab    Staff Present Renita Papa, RN Moises Blood, BS, ACSM CEP, Exercise Physiologist;Kara Eliezer Bottom, MS, ASCM CEP, Exercise Physiologist    Virtual Visit No    Medication changes reported     No    Fall or balance concerns reported    No    Warm-up and Cool-down Performed on first and last piece of equipment    Resistance Training Performed Yes    VAD Patient? No    PAD/SET Patient? No      Pain Assessment   Currently in Pain? No/denies              Social History   Tobacco Use  Smoking Status Former Smoker  . Packs/day: 1.25  . Years: 30.00  . Pack years: 37.50  . Quit date: 2006  . Years since quitting: 16.4  Smokeless Tobacco Never Used  Tobacco Comment   Quit in 8/07    Goals Met:  Independence with exercise equipment Exercise tolerated well No report of cardiac concerns or symptoms Strength training completed today  Goals Unmet:  Not Applicable  Comments: Pt able to follow exercise prescription today without complaint.  Will continue to monitor for progression.    Dr. Emily Filbert is Medical Director for [place].  Dr. Ottie Glazier is Medical Director for [facility] Pulmonary Rehabilitation.

## 2020-11-23 NOTE — Telephone Encounter (Signed)
-----   Message from Jonathon Bellows, MD sent at 11/23/2020  3:00 PM EDT ----- Typo in previous  message:   inform vitamin D levels are low

## 2020-11-23 NOTE — Telephone Encounter (Signed)
Informed patient of lab results and Dr. Georgeann Oppenheim comments/recommendations. Pt verbalized understanding.

## 2020-11-25 ENCOUNTER — Other Ambulatory Visit: Payer: Self-pay

## 2020-11-25 DIAGNOSIS — I272 Pulmonary hypertension, unspecified: Secondary | ICD-10-CM | POA: Diagnosis not present

## 2020-11-25 DIAGNOSIS — J449 Chronic obstructive pulmonary disease, unspecified: Secondary | ICD-10-CM | POA: Diagnosis not present

## 2020-11-25 NOTE — Patient Instructions (Signed)
Discharge Patient Instructions  Patient Details  Name: Carrie Hendricks MRN: [id number] Date of Birth: October 16, 1950 Referring Provider:  Tyler Pita, MD   Number of Visits: 77  Reason for Discharge:  Patient reached a stable level of exercise. Patient independent in their exercise. Patient has met program and personal goals.  Smoking History:  Social History   Tobacco Use  Smoking Status Former Smoker  . Packs/day: 1.25  . Years: 30.00  . Pack years: 37.50  . Quit date: 2006  . Years since quitting: 16.4  Smokeless Tobacco Never Used  Tobacco Comment   Quit in 8/07    Diagnosis:  Pulmonary hypertension (HCC)  Chronic obstructive pulmonary disease, unspecified COPD type ([place])  Initial Exercise Prescription:  Initial Exercise Prescription - 09/03/20 1700      Date of Initial Exercise RX and Referring Provider   Date 09/03/20    Referring Provider Vernard Gambles MD      Oxygen   Oxygen Continuous    Liters 2      Treadmill   MPH 1.3    Grade 0    Minutes 15    METs 2      Recumbant Bike   Level 1    RPM 60    Watts 15    Minutes 15    METs 1.9      NuStep   Level 1    SPM 80    Minutes 15    METs 1.9      Prescription Details   Frequency (times per week) 3    Duration Progress to 30 minutes of continuous aerobic without signs/symptoms of physical distress      Intensity   THRR 40-80% of Max Heartrate 106-136    Ratings of Perceived Exertion 11-13    Perceived Dyspnea 0-4      Progression   Progression Continue to progress workloads to maintain intensity without signs/symptoms of physical distress.      Resistance Training   Training Prescription Yes    Weight Other   Using wrist weights due to severe arthritis   Reps 10-15           Discharge Exercise Prescription (Final Exercise Prescription Changes):  Exercise Prescription Changes - 11/10/20 1400      Response to Exercise   Blood Pressure (Admit) 102/60    Blood Pressure  (Exercise) 128/72    Blood Pressure (Exit) 112/82    Heart Rate (Admit) 71 bpm    Heart Rate (Exercise) 89 bpm    Heart Rate (Exit) 79 bpm    Oxygen Saturation (Admit) 94 %    Oxygen Saturation (Exercise) 92 %    Oxygen Saturation (Exit) 97 %    Rating of Perceived Exertion (Exercise) 13    Perceived Dyspnea (Exercise) 3    Symptoms SOB    Duration Continue with 30 min of aerobic exercise without signs/symptoms of physical distress.    Intensity THRR unchanged      Progression   Progression Continue to progress workloads to maintain intensity without signs/symptoms of physical distress.    Average METs 2.42           Functional Capacity:  6 Minute Walk    Row Name 09/03/20 1711 11/16/20 1739       6 Minute Walk   Phase Initial --    Distance 400 feet 800 feet    Distance % Change -- 100 %    Distance Feet Change -- 400 ft  Walk Time 3 minutes 6 minutes    # of Rest Breaks 0 0    MPH 1.51 1.51    METS 1.91 2.93    RPE 13 13    Perceived Dyspnea  1 2    VO2 Peak 6.69 10.25    Symptoms No No    Resting HR 77 bpm 76 bpm    Resting BP 126/72 138/82    Resting Oxygen Saturation  98 % 93 %    Exercise Oxygen Saturation  during 6 min walk 80 %  Stopped test 91 %    Max Ex. HR 89 bpm 102 bpm    Max Ex. BP 136/74 134/68    2 Minute Post BP 124/74 --         Interval HR   1 Minute HR 85 86    2 Minute HR 88 95    3 Minute HR 88 96    4 Minute HR 89 96    5 Minute HR --  Stopped test 101    6 Minute HR --  Stopped test 102    2 Minute Post HR 74 --    Interval Heart Rate? Yes Yes         Interval Oxygen   Interval Oxygen? Yes Yes    Baseline Oxygen Saturation % 98 % 93 %    1 Minute Oxygen Saturation % 90 % 97 %    1 Minute Liters of Oxygen 2 L 2 L    2 Minute Oxygen Saturation % 84 % 94 %    2 Minute Liters of Oxygen 2 L 2 L    3 Minute Oxygen Saturation % 81 % 94 %    3 Minute Liters of Oxygen 2 L 2 L    4 Minute Oxygen Saturation % 80 % 92 %    4 Minute  Liters of Oxygen 2 L 2 L    5 Minute Oxygen Saturation % --  Stopped test 91 %    5 Minute Liters of Oxygen -- 2 L    6 Minute Oxygen Saturation % --  Stopped test 91 %    6 Minute Liters of Oxygen -- 2 L    2 Minute Post Oxygen Saturation % 94 % --    2 Minute Post Liters of Oxygen 2 L --          Nutrition:  Nutrition Therapy & Goals - 09/16/20 1726      Nutrition Therapy   Diet High kcal, high protein    Protein (specify units) 55g    Fiber 20 grams    Whole Grain Foods 2 servings   eats very little   Saturated Fats 12 max. grams    Fruits and Vegetables 3 servings/day   eats very little   Sodium 1.5 grams      Personal Nutrition Goals   Nutrition Goal ST: add two nutritionally dense drinks in between meals like soymilk and boost, add fat like butter and oil to meals she is already eating LT: meet energy and protein needs, gain weight    Comments She also has Ulcerative Colitis - on medication that is working well for her. She reports not having trigger foods - she has trouble with higher fat foods like whole fat dairy products. No shortness of breath during or after meals. Meals about the size of fist - half chicken, potato and other vegetable. B: mini cinnamon waffles (a couple of squares at home  and a couple at work before lunch) L: [employer] with [place] and mayo and pickle (half) or lowfat yogurt with orange  D: 2 oz of chicken (fish: usually tilapia, 1-2x/week), potato and other vegetable like sliced pickeled beets or healthy choice and lean cuisines due to the smaller portions they provide. S: mini carrots with ranch dip  Drinks: water - during meals. Discussed pulmonary MNT. Her weight today was 100.7 lbs.      Intervention Plan   Intervention Prescribe, educate and counsel regarding individualized specific dietary modifications aiming towards targeted core components such as weight, hypertension, lipid management, diabetes, heart failure and other comorbidities.;Nutrition  handout(s) given to patient.    Expected Outcomes Short Term Goal: Understand basic principles of dietary content, such as calories, fat, sodium, cholesterol and nutrients.;Short Term Goal: A plan has been developed with personal nutrition goals set during dietitian appointment.;Long Term Goal: Adherence to prescribed nutrition plan.          Goals reviewed with patient; copy given to patient.

## 2020-11-25 NOTE — Progress Notes (Signed)
Daily Session Note  Patient Details  Name: Carrie Hendricks MRN: [id number] Date of Birth: 1950-07-22 Referring Provider:   Flowsheet Row Pulmonary Rehab from 09/03/2020 in [facility] Cardiac and Pulmonary Rehab  Referring Provider Vernard Gambles MD      Encounter Date: 11/25/2020  Check In:  Session Check In - 11/25/20 1711      Check-In   Supervising physician immediately available to respond to emergencies See telemetry face sheet for immediately available ER MD    Location [facility]-Cardiac & Pulmonary Rehab    Staff Present Birdie Sons, MPA, RN;Joseph Lou Miner, MS, ASCM CEP, Exercise Physiologist;Melissa Caiola RDN, LDN    Virtual Visit No    Medication changes reported     No    Fall or balance concerns reported    No    Warm-up and Cool-down Performed on first and last piece of equipment    Resistance Training Performed Yes    VAD Patient? No    PAD/SET Patient? No      Pain Assessment   Currently in Pain? No/denies              Social History   Tobacco Use  Smoking Status Former Smoker  . Packs/day: 1.25  . Years: 30.00  . Pack years: 37.50  . Quit date: 2006  . Years since quitting: 16.4  Smokeless Tobacco Never Used  Tobacco Comment   Quit in 8/07    Goals Met:  Independence with exercise equipment Exercise tolerated well No report of cardiac concerns or symptoms Strength training completed today  Goals Unmet:  Not Applicable  Comments: Pt able to follow exercise prescription today without complaint.  Will continue to monitor for progression.    Dr. Emily Filbert is Medical Director for [place].  Dr. Ottie Glazier is Medical Director for [facility] Pulmonary Rehabilitation.

## 2020-11-26 ENCOUNTER — Encounter: Payer: Medicare HMO | Admitting: *Deleted

## 2020-11-26 DIAGNOSIS — I272 Pulmonary hypertension, unspecified: Secondary | ICD-10-CM

## 2020-11-26 DIAGNOSIS — J449 Chronic obstructive pulmonary disease, unspecified: Secondary | ICD-10-CM | POA: Diagnosis not present

## 2020-11-26 NOTE — Progress Notes (Signed)
Discharge Progress Report  Patient Details  Name: Carrie Hendricks MRN: [id number] Date of Birth: 31-Jul-1950 Referring Provider:   Flowsheet Row Pulmonary Rehab from 09/03/2020 in [facility] Cardiac and Pulmonary Rehab  Referring Provider Vernard Gambles MD       Number of Visits: 36  Reason for Discharge:  Patient reached a stable level of exercise. Patient independent in their exercise. Patient has met program and personal goals.  Smoking History:  Social History   Tobacco Use  Smoking Status Former Smoker  . Packs/day: 1.25  . Years: 30.00  . Pack years: 37.50  . Quit date: 2006  . Years since quitting: 16.4  Smokeless Tobacco Never Used  Tobacco Comment   Quit in 8/07    Diagnosis:  Pulmonary hypertension (HCC)  Chronic obstructive pulmonary disease, unspecified COPD type ([place])  ADL UCSD:  Pulmonary Assessment Scores    Row Name 09/03/20 1718 11/26/20 1752       ADL UCSD   ADL Phase Entry Exit    SOB Score total 54 74    Rest 1 1    Walk 3 2    Stairs 5 4    Bath 2 2    Dress 3 2    Shop 3 3         CAT Score   CAT Score 27 19         mMRC Score   mMRC Score 2 --           Initial Exercise Prescription:  Initial Exercise Prescription - 09/03/20 1700      Date of Initial Exercise RX and Referring Provider   Date 09/03/20    Referring Provider Vernard Gambles MD      Oxygen   Oxygen Continuous    Liters 2      Treadmill   MPH 1.3    Grade 0    Minutes 15    METs 2      Recumbant Bike   Level 1    RPM 60    Watts 15    Minutes 15    METs 1.9      NuStep   Level 1    SPM 80    Minutes 15    METs 1.9      Prescription Details   Frequency (times per week) 3    Duration Progress to 30 minutes of continuous aerobic without signs/symptoms of physical distress      Intensity   THRR 40-80% of Max Heartrate 106-136    Ratings of Perceived Exertion 11-13    Perceived Dyspnea 0-4      Progression   Progression Continue to  progress workloads to maintain intensity without signs/symptoms of physical distress.      Resistance Training   Training Prescription Yes    Weight Other   Using wrist weights due to severe arthritis   Reps 10-15           Discharge Exercise Prescription (Final Exercise Prescription Changes):  Exercise Prescription Changes - 11/10/20 1400      Response to Exercise   Blood Pressure (Admit) 102/60    Blood Pressure (Exercise) 128/72    Blood Pressure (Exit) 112/82    Heart Rate (Admit) 71 bpm    Heart Rate (Exercise) 89 bpm    Heart Rate (Exit) 79 bpm    Oxygen Saturation (Admit) 94 %    Oxygen Saturation (Exercise) 92 %    Oxygen Saturation (Exit) 97 %  Rating of Perceived Exertion (Exercise) 13    Perceived Dyspnea (Exercise) 3    Symptoms SOB    Duration Continue with 30 min of aerobic exercise without signs/symptoms of physical distress.    Intensity THRR unchanged      Progression   Progression Continue to progress workloads to maintain intensity without signs/symptoms of physical distress.    Average METs 2.42           Functional Capacity:  6 Minute Walk    Row Name 09/03/20 1711 11/16/20 1739       6 Minute Walk   Phase Initial --    Distance 400 feet 800 feet    Distance % Change -- 100 %    Distance Feet Change -- 400 ft    Walk Time 3 minutes 6 minutes    # of Rest Breaks 0 0    MPH 1.51 1.51    METS 1.91 2.93    RPE 13 13    Perceived Dyspnea  1 2    VO2 Peak 6.69 10.25    Symptoms No No    Resting HR 77 bpm 76 bpm    Resting BP 126/72 138/82    Resting Oxygen Saturation  98 % 93 %    Exercise Oxygen Saturation  during 6 min walk 80 %  Stopped test 91 %    Max Ex. HR 89 bpm 102 bpm    Max Ex. BP 136/74 134/68    2 Minute Post BP 124/74 --         Interval HR   1 Minute HR 85 86    2 Minute HR 88 95    3 Minute HR 88 96    4 Minute HR 89 96    5 Minute HR --  Stopped test 101    6 Minute HR --  Stopped test 102    2 Minute Post HR 74  --    Interval Heart Rate? Yes Yes         Interval Oxygen   Interval Oxygen? Yes Yes    Baseline Oxygen Saturation % 98 % 93 %    1 Minute Oxygen Saturation % 90 % 97 %    1 Minute Liters of Oxygen 2 L 2 L    2 Minute Oxygen Saturation % 84 % 94 %    2 Minute Liters of Oxygen 2 L 2 L    3 Minute Oxygen Saturation % 81 % 94 %    3 Minute Liters of Oxygen 2 L 2 L    4 Minute Oxygen Saturation % 80 % 92 %    4 Minute Liters of Oxygen 2 L 2 L    5 Minute Oxygen Saturation % --  Stopped test 91 %    5 Minute Liters of Oxygen -- 2 L    6 Minute Oxygen Saturation % --  Stopped test 91 %    6 Minute Liters of Oxygen -- 2 L    2 Minute Post Oxygen Saturation % 94 % --    2 Minute Post Liters of Oxygen 2 L --           Psychological, QOL, Others - Outcomes: PHQ 2/9: Depression screen [facility] 2/9 11/26/2020 11/04/2020 10/28/2020 09/30/2020 09/03/2020  Decreased Interest 0 0 1 0 0  Down, Depressed, Hopeless 0 0 1 0 3  PHQ - 2 Score 0 0 2 0 3  Altered sleeping 2 - 2 1 2  Tired, decreased energy 2 - 2 1 3   Change in appetite 2 - 2 1 2   Feeling bad or failure about yourself  - - 2 0 2  Trouble concentrating 0 - 1 0 1  Moving slowly or fidgety/restless 0 - 0 0 0  Suicidal thoughts 0 - 0 0 0  PHQ-9 Score 6 - 11 3 13   Difficult doing work/chores - - Somewhat difficult Not difficult at all Somewhat difficult  Some recent data might be hidden   Nutrition & Weight - Outcomes:  Pre Biometrics - 09/07/20 1708      Pre Biometrics   Single Leg Stand 30 seconds           Nutrition:  Nutrition Therapy & Goals - 09/16/20 1726      Nutrition Therapy   Diet High kcal, high protein    Protein (specify units) 55g    Fiber 20 grams    Whole Grain Foods 2 servings   eats very little   Saturated Fats 12 max. grams    Fruits and Vegetables 3 servings/day   eats very little   Sodium 1.5 grams      Personal Nutrition Goals   Nutrition Goal ST: add two nutritionally dense drinks in between meals  like soymilk and boost, add fat like butter and oil to meals she is already eating LT: meet energy and protein needs, gain weight    Comments She also has Ulcerative Colitis - on medication that is working well for her. She reports not having trigger foods - she has trouble with higher fat foods like whole fat dairy products. No shortness of breath during or after meals. Meals about the size of fist - half chicken, potato and other vegetable. B: mini cinnamon waffles (a couple of squares at home and a couple at work before lunch) L: [employer] with [place] and mayo and pickle (half) or lowfat yogurt with orange  D: 2 oz of chicken (fish: usually tilapia, 1-2x/week), potato and other vegetable like sliced pickeled beets or healthy choice and lean cuisines due to the smaller portions they provide. S: mini carrots with ranch dip  Drinks: water - during meals. Discussed pulmonary MNT. Her weight today was 100.7 lbs.      Intervention Plan   Intervention Prescribe, educate and counsel regarding individualized specific dietary modifications aiming towards targeted core components such as weight, hypertension, lipid management, diabetes, heart failure and other comorbidities.;Nutrition handout(s) given to patient.    Expected Outcomes Short Term Goal: Understand basic principles of dietary content, such as calories, fat, sodium, cholesterol and nutrients.;Short Term Goal: A plan has been developed with personal nutrition goals set during dietitian appointment.;Long Term Goal: Adherence to prescribed nutrition plan.           Nutrition Discharge:   Education Questionnaire Score:  Knowledge Questionnaire Score - 11/26/20 1756      Knowledge Questionnaire Score   Post Score 18/18           Goals reviewed with patient; copy given to patient.

## 2020-11-26 NOTE — Progress Notes (Signed)
Daily Session Note  Patient Details  Name: Carrie Hendricks MRN: [id number] Date of Birth: June 17, 1951 Referring Provider:   Flowsheet Row Pulmonary Rehab from 09/03/2020 in [facility]  Referring Provider Vernard Gambles MD      Encounter Date: 11/26/2020  Check In:  Session Check In - 11/26/20 1702      Check-In   Supervising physician immediately available to respond to emergencies See telemetry face sheet for immediately available ER MD    Location [facility]    Staff Present Renita Papa, RN BSN;Melissa Caiola RDN, Rowe Pavy, BA, ACSM CEP, Exercise Physiologist    Virtual Visit No    Medication changes reported     No    Fall or balance concerns reported    No    Warm-up and Cool-down Performed on first and last piece of equipment    Resistance Training Performed Yes    VAD Patient? No    PAD/SET Patient? No      Pain Assessment   Currently in Pain? No/denies              Social History   Tobacco Use  Smoking Status Former Smoker  . Packs/day: 1.25  . Years: 30.00  . Pack years: 37.50  . Quit date: 2006  . Years since quitting: 16.4  Smokeless Tobacco Never Used  Tobacco Comment   Quit in 8/07    Goals Met:  Independence with exercise equipment Exercise tolerated well No report of cardiac concerns or symptoms Strength training completed today  Goals Unmet:  Not Applicable  Comments:  Tela graduated today from  rehab with 36 sessions completed.  Details of the patient's exercise prescription and what She needs to do in order to continue the prescription and progress were discussed with patient.  Patient was given a copy of prescription and goals.  Patient verbalized understanding.  Ethelyne plans to continue to exercise by attending the [facility].     Dr. Emily Filbert is Medical Director for [place].  Dr. Ottie Glazier is Medical Director for [facility].

## 2020-11-26 NOTE — Progress Notes (Signed)
Pulmonary Individual Treatment Plan  Patient Details  Name: Carrie Hendricks MRN: [id number] Date of Birth: [birth year] Referring Provider:   Flowsheet Row Pulmonary Rehab from 09/03/2020 in [facility] Cardiac and Pulmonary Rehab  Referring Provider Vernard Gambles MD      Initial Encounter Date:  Flowsheet Row Pulmonary Rehab from 09/03/2020 in [facility] Cardiac and Pulmonary Rehab  Date 09/03/20      Visit Diagnosis: Pulmonary hypertension ([place])  Chronic obstructive pulmonary disease, unspecified COPD type ([place])  Patient's Home Medications on Admission:  Current Outpatient Medications:  .  albuterol (VENTOLIN HFA) 108 (90 Base) MCG/ACT inhaler, Inhale 2 puffs into the lungs every 6 (six) hours as needed for wheezing or shortness of breath., Disp: 54 g, Rfl: 0 .  alendronate (FOSAMAX) 70 MG tablet, Take 1 tablet by mouth once a week., Disp: , Rfl:  .  amLODipine (NORVASC) 5 MG tablet, Take 1 tablet (5 mg total) by mouth daily., Disp: 90 tablet, Rfl: 3 .  Aspirin 81 MG EC tablet, Take 81 mg by mouth daily., Disp: , Rfl:  .  Budeson-Glycopyrrol-Formoterol (BREZTRI AEROSPHERE) 160-9-4.8 MCG/ACT AERO, Inhale 2 puffs into the lungs in the morning and at bedtime., Disp: 5.9 g, Rfl: 0 .  etanercept (ENBREL) 50 MG/ML injection, Inject 50 mg into the skin once a week., Disp: , Rfl:  .  hydrochlorothiazide (HYDRODIURIL) 25 MG tablet, Take 1 tablet (25 mg total) by mouth daily., Disp: 90 tablet, Rfl: 3 .  hydroxychloroquine (PLAQUENIL) 200 MG tablet, Take 200 mg by mouth daily., Disp: , Rfl:  .  Multiple Vitamin (MULTIVITAMIN) capsule, Take 1 capsule by mouth daily., Disp: , Rfl:  .  sulfaSALAzine (AZULFIDINE) 500 MG tablet, Take 1 tablet (500 mg total) by mouth 4 (four) times daily., Disp: 360 tablet, Rfl: 3  Past Medical History: Past Medical History:  Diagnosis Date  . Arthritis    rheumatoid  . Complication of anesthesia   . Hypertension   . Osteoporosis   . PONV (postoperative nausea and  vomiting)   . Pulmonary hypertension ([place])   . Rheumatoid arthritis(714.0)    ? scleroderma    Tobacco Use: Social History   Tobacco Use  Smoking Status Former Smoker  . Packs/day: 1.25  . Years: 30.00  . Pack years: 37.50  . Quit date: 2006  . Years since quitting: 16.4  Smokeless Tobacco Never Used  Tobacco Comment   Quit in 8/07    Labs: Recent Review Flowsheet Data    Labs for ITP Cardiac and Pulmonary Rehab Latest Ref Rng & Units 03/05/2014 05/15/2015 05/25/2016 08/14/2018 10/30/2020   Cholestrol 0 - 200 mg/dL 195 177 197 186 156   LDLCALC 0 - 99 mg/dL 113(H) 93 118(H) 105(H) 78   HDL >39.00 mg/dL 69.80 70 64.30 63.30 69.10   Trlycerides 0.0 - 149.0 mg/dL 63.0 69 76.0 91.0 45.0       Pulmonary Assessment Scores:  Pulmonary Assessment Scores    Row Name 09/03/20 1718 11/26/20 1752       ADL UCSD   ADL Phase Entry Exit    SOB Score total 54 74    Rest 1 1    Walk 3 2    Stairs 5 4    Bath 2 2    Dress 3 2    Shop 3 3         CAT Score   CAT Score 27 19         mMRC Score   mMRC Score 2 --  UCSD: Self-administered rating of dyspnea associated with activities of daily living (ADLs) 6-point scale (0 = "not at all" to 5 = "maximal or unable to do because of breathlessness")  Scoring Scores range from 0 to 120.  Minimally important difference is 5 units  CAT: CAT can identify the health impairment of COPD patients and is better correlated with disease progression.  CAT has a scoring range of zero to 40. The CAT score is classified into four groups of low (less than 10), medium (10 - 20), high (21-30) and very high (31-40) based on the impact level of disease on health status. A CAT score over 10 suggests significant symptoms.  A worsening CAT score could be explained by an exacerbation, poor medication adherence, poor inhaler technique, or progression of COPD or comorbid conditions.  CAT MCID is 2 points  mMRC: mMRC (Modified Medical Research  Council) Dyspnea Scale is used to assess the degree of baseline functional disability in patients of respiratory disease due to dyspnea. No minimal important difference is established. A decrease in score of 1 point or greater is considered a positive change.   Pulmonary Function Assessment:   Exercise Target Goals: Exercise Program Goal: Individual exercise prescription set using results from initial 6 min walk test and THRR while considering  patient's activity barriers and safety.   Exercise Prescription Goal: Initial exercise prescription builds to 30-45 minutes a day of aerobic activity, 2-3 days per week.  Home exercise guidelines will be given to patient during program as part of exercise prescription that the participant will acknowledge.  Education: Aerobic Exercise: - Group verbal and visual presentation on the components of exercise prescription. Introduces F.I.T.T principle from ACSM for exercise prescriptions.  Reviews F.I.T.T. principles of aerobic exercise including progression. Written material given at graduation. Flowsheet Row Pulmonary Rehab from 11/11/2020 in [facility] Cardiac and Pulmonary Rehab  Date 10/21/20  Educator AS  Instruction Review Code 1- Verbalizes Understanding      Education: Resistance Exercise: - Group verbal and visual presentation on the components of exercise prescription. Introduces F.I.T.T principle from ACSM for exercise prescriptions  Reviews F.I.T.T. principles of resistance exercise including progression. Written material given at graduation. Flowsheet Row Pulmonary Rehab from 11/11/2020 in [facility] Cardiac and Pulmonary Rehab  Date 10/28/20  Educator AS  Instruction Review Code 1- Verbalizes Understanding       Education: Exercise & Equipment Safety: - Individual verbal instruction and demonstration of equipment use and safety with use of the equipment. Flowsheet Row Pulmonary Rehab from 11/11/2020 in [facility] Cardiac and Pulmonary Rehab  Education  need identified 09/03/20  Date 09/03/20  Educator [place]  Instruction Review Code 1- Verbalizes Understanding      Education: Exercise Physiology & General Exercise Guidelines: - Group verbal and written instruction with models to review the exercise physiology of the cardiovascular system and associated critical values. Provides general exercise guidelines with specific guidelines to those with heart or lung disease.  Flowsheet Row Pulmonary Rehab from 11/11/2020 in [facility] Cardiac and Pulmonary Rehab  Date 10/14/20  Educator AS  Instruction Review Code 1- Verbalizes Understanding      Education: Flexibility, Balance, Mind/Body Relaxation: - Group verbal and visual presentation with interactive activity on the components of exercise prescription. Introduces F.I.T.T principle from ACSM for exercise prescriptions. Reviews F.I.T.T. principles of flexibility and balance exercise training including progression. Also discusses the mind body connection.  Reviews various relaxation techniques to help reduce and manage stress (i.e. Deep breathing, progressive muscle relaxation, and visualization). Balance  handout provided to take home. Written material given at graduation.   Activity Barriers & Risk Stratification:  Activity Barriers & Cardiac Risk Stratification - 09/03/20 1705      Activity Barriers & Cardiac Risk Stratification   Activity Barriers Arthritis;Shortness of Breath;Back Problems;Deconditioning;Other (comment)    Comments Sciatica, DDD           6 Minute Walk:  6 Minute Walk    Row Name 09/03/20 1711 11/16/20 1739       6 Minute Walk   Phase Initial --    Distance 400 feet 800 feet    Distance % Change -- 100 %    Distance Feet Change -- 400 ft    Walk Time 3 minutes 6 minutes    # of Rest Breaks 0 0    MPH 1.51 1.51    METS 1.91 2.93    RPE 13 13    Perceived Dyspnea  1 2    VO2 Peak 6.69 10.25    Symptoms No No    Resting HR 77 bpm 76 bpm    Resting BP 126/72 138/82     Resting Oxygen Saturation  98 % 93 %    Exercise Oxygen Saturation  during 6 min walk 80 %  Stopped test 91 %    Max Ex. HR 89 bpm 102 bpm    Max Ex. BP 136/74 134/68    2 Minute Post BP 124/74 --         Interval HR   1 Minute HR 85 86    2 Minute HR 88 95    3 Minute HR 88 96    4 Minute HR 89 96    5 Minute HR --  Stopped test 101    6 Minute HR --  Stopped test 102    2 Minute Post HR 74 --    Interval Heart Rate? Yes Yes         Interval Oxygen   Interval Oxygen? Yes Yes    Baseline Oxygen Saturation % 98 % 93 %    1 Minute Oxygen Saturation % 90 % 97 %    1 Minute Liters of Oxygen 2 L 2 L    2 Minute Oxygen Saturation % 84 % 94 %    2 Minute Liters of Oxygen 2 L 2 L    3 Minute Oxygen Saturation % 81 % 94 %    3 Minute Liters of Oxygen 2 L 2 L    4 Minute Oxygen Saturation % 80 % 92 %    4 Minute Liters of Oxygen 2 L 2 L    5 Minute Oxygen Saturation % --  Stopped test 91 %    5 Minute Liters of Oxygen -- 2 L    6 Minute Oxygen Saturation % --  Stopped test 91 %    6 Minute Liters of Oxygen -- 2 L    2 Minute Post Oxygen Saturation % 94 % --    2 Minute Post Liters of Oxygen 2 L --          Oxygen Initial Assessment:  Oxygen Initial Assessment - 09/03/20 1717      Home Oxygen   Home Oxygen Device E-Tanks;Portable Concentrator;Home Concentrator    Sleep Oxygen Prescription Continuous    Liters per minute 2    Home Exercise Oxygen Prescription Continuous    Liters per minute 2    Home Resting Oxygen Prescription Continuous  Liters per minute 2    Compliance with Home Oxygen Use Yes      Initial 6 min Walk   Oxygen Used Continuous    Liters per minute 2      Program Oxygen Prescription   Program Oxygen Prescription Continuous    Liters per minute 2      Intervention   Short Term Goals To learn and understand importance of monitoring SPO2 with pulse oximeter and demonstrate accurate use of the pulse oximeter.;To learn and demonstrate proper pursed  lip breathing techniques or other breathing techniques.;To learn and understand importance of maintaining oxygen saturations>88%;To learn and demonstrate proper use of respiratory medications    Long  Term Goals Verbalizes importance of monitoring SPO2 with pulse oximeter and return demonstration;Maintenance of O2 saturations>88%;Exhibits proper breathing techniques, such as pursed lip breathing or other method taught during program session;Demonstrates proper use of MDI's;Compliance with respiratory medication           Oxygen Re-Evaluation:  Oxygen Re-Evaluation    Row Name 09/07/20 1701 10/08/20 1727 11/05/20 1723         Program Oxygen Prescription   Program Oxygen Prescription Continuous Continuous;E-Tanks Continuous;E-Tanks     Liters per minute 2 2 2            Home Oxygen   Home Oxygen Device E-Tanks;Portable Concentrator;Home Concentrator Home Concentrator;Portable Concentrator;E-Tanks Home Concentrator;Portable Concentrator;E-Tanks     Sleep Oxygen Prescription Continuous Continuous Continuous     Liters per minute 2 2 2      Home Exercise Oxygen Prescription Continuous Continuous Continuous     Liters per minute 2 2 2      Home Resting Oxygen Prescription Continuous Continuous Continuous     Liters per minute 2 2 2      Compliance with Home Oxygen Use Yes Yes Yes           Goals/Expected Outcomes   Short Term Goals To learn and understand importance of monitoring SPO2 with pulse oximeter and demonstrate accurate use of the pulse oximeter.;To learn and demonstrate proper pursed lip breathing techniques or other breathing techniques.;To learn and understand importance of maintaining oxygen saturations>88% To learn and understand importance of maintaining oxygen saturations>88% To learn and understand importance of maintaining oxygen saturations>88%     Long  Term Goals Verbalizes importance of monitoring SPO2 with pulse oximeter and return demonstration;Maintenance of O2  saturations>88%;Exhibits proper breathing techniques, such as pursed lip breathing or other method taught during program session;Exhibits compliance with exercise, home and travel O2 prescription Maintenance of O2 saturations>88% Maintenance of O2 saturations>88%     Comments Reviewed PLB technique with pt.  Talked about how it works and it's importance in maintaining their exercise saturations. She has a pulse oximeter to check her oxygen saturation at home. Informed her where to get one and explained why it is important to have one. Reviewed that oxygen saturations should be 88 percent and above. Patient has a pulse oximeter at home to check his oxygen. Carrie Hendricks is continuing to check her O2 at home with her pulse oximeter. She doesn't notice that she drops below 88% at home, but is aware to stop and PLB if she needs to.     Goals/Expected Outcomes Short: Become more profiecient at using PLB.   Long: Become independent at using PLB. Short: monitor oxygen at home with exertion. Long: maintain oxygen saturations above 88 percent independently. Short: Continue compliance with checking O2 and maintaining above 88% Long: Practice PLB technique for continuous maintenance  Oxygen Discharge (Final Oxygen Re-Evaluation):  Oxygen Re-Evaluation - 11/05/20 1723      Program Oxygen Prescription   Program Oxygen Prescription Continuous;E-Tanks    Liters per minute 2      Home Oxygen   Home Oxygen Device Home Concentrator;Portable Concentrator;E-Tanks    Sleep Oxygen Prescription Continuous    Liters per minute 2    Home Exercise Oxygen Prescription Continuous    Liters per minute 2    Home Resting Oxygen Prescription Continuous    Liters per minute 2    Compliance with Home Oxygen Use Yes      Goals/Expected Outcomes   Short Term Goals To learn and understand importance of maintaining oxygen saturations>88%    Long  Term Goals Maintenance of O2 saturations>88%    Comments Carrie Hendricks is  continuing to check her O2 at home with her pulse oximeter. She doesn't notice that she drops below 88% at home, but is aware to stop and PLB if she needs to.    Goals/Expected Outcomes Short: Continue compliance with checking O2 and maintaining above 88% Long: Practice PLB technique for continuous maintenance           Initial Exercise Prescription:  Initial Exercise Prescription - 09/03/20 1700      Date of Initial Exercise RX and Referring Provider   Date 09/03/20    Referring Provider Vernard Gambles MD      Oxygen   Oxygen Continuous    Liters 2      Treadmill   MPH 1.3    Grade 0    Minutes 15    METs 2      Recumbant Bike   Level 1    RPM 60    Watts 15    Minutes 15    METs 1.9      NuStep   Level 1    SPM 80    Minutes 15    METs 1.9      Prescription Details   Frequency (times per week) 3    Duration Progress to 30 minutes of continuous aerobic without signs/symptoms of physical distress      Intensity   THRR 40-80% of Max Heartrate 106-136    Ratings of Perceived Exertion 11-13    Perceived Dyspnea 0-4      Progression   Progression Continue to progress workloads to maintain intensity without signs/symptoms of physical distress.      Resistance Training   Training Prescription Yes    Weight Other   Using wrist weights due to severe arthritis   Reps 10-15           Perform Capillary Blood Glucose checks as needed.  Exercise Prescription Changes:   Exercise Prescription Changes    Row Name 09/03/20 1700 09/15/20 1300 09/23/20 1800 09/29/20 1600 10/13/20 1200     Response to Exercise   Blood Pressure (Admit) 126/72 136/72 -- 126/64 126/92   Blood Pressure (Exercise) 136/74 132/70 -- 138/66 126/74   Blood Pressure (Exit) 124/74 116/66 -- 122/68 102/60   Heart Rate (Admit) 77 bpm 80 bpm -- 87 bpm 80 bpm   Heart Rate (Exercise) 89 bpm 88 bpm -- 91 bpm 97 bpm   Heart Rate (Exit) 74 bpm 86 bpm -- 88 bpm 87 bpm   Oxygen Saturation (Admit) 98  % 96 % -- 96 % 94 %   Oxygen Saturation (Exercise) 80 % 93 % -- 95 % 93 %   Oxygen Saturation (Exit) 96 % 94 % --  96 % 97 %   Rating of Perceived Exertion (Exercise) 13 14 -- 15 15   Perceived Dyspnea (Exercise) 1 -- -- 3 3   Symptoms none none -- SOB SOB   Comments walk test results -- -- -- --   Duration -- Continue with 30 min of aerobic exercise without signs/symptoms of physical distress. -- Continue with 30 min of aerobic exercise without signs/symptoms of physical distress. Continue with 30 min of aerobic exercise without signs/symptoms of physical distress.   Intensity -- THRR unchanged -- THRR unchanged THRR unchanged     Progression   Progression -- Continue to progress workloads to maintain intensity without signs/symptoms of physical distress. -- Continue to progress workloads to maintain intensity without signs/symptoms of physical distress. Continue to progress workloads to maintain intensity without signs/symptoms of physical distress.   Average METs -- 2.57 -- 2.45 2.6     Resistance Training   Training Prescription -- Yes -- Yes Yes   Weight -- Other -- 1 lb 1 lb   Reps -- 10-15 -- 10-15 10-15     Interval Training   Interval Training -- -- -- No No     Oxygen   Oxygen -- Continuous -- -- --   Liters -- 2 -- -- --     Recumbant Bike   Level -- 1 -- 2 2   Watts -- -- -- 12 --   Minutes -- 15 -- 15 15   METs -- 3.74 -- 2.14 --     NuStep   Level -- 1 -- 2 2   SPM -- 80 -- -- --   Minutes -- 15 -- 15 15   METs -- 1.4 -- 2.2 2.3     Home Exercise Plan   Plans to continue exercise at -- -- Home (comment)  Recumbant bike, walking Home (comment)  Recumbant bike, walking Home (comment)  Recumbant bike, walking   Frequency -- -- Add 2 additional days to program exercise sessions. Add 2 additional days to program exercise sessions. Add 2 additional days to program exercise sessions.   Initial Home Exercises Provided -- -- 09/23/20 09/23/20 09/23/20   Row Name 10/29/20  1200 11/10/20 1400           Response to Exercise   Blood Pressure (Admit) 116/62 102/60      Blood Pressure (Exercise) 102/60 128/72      Blood Pressure (Exit) 112/62 112/82      Heart Rate (Admit) 80 bpm 71 bpm      Heart Rate (Exercise) 90 bpm 89 bpm      Heart Rate (Exit) 83 bpm 79 bpm      Oxygen Saturation (Admit) 98 % 94 %      Oxygen Saturation (Exercise) 91 % 92 %      Oxygen Saturation (Exit) 99 % 97 %      Rating of Perceived Exertion (Exercise) 17 13      Perceived Dyspnea (Exercise) 3 3      Symptoms SOB SOB      Duration Continue with 30 min of aerobic exercise without signs/symptoms of physical distress. Continue with 30 min of aerobic exercise without signs/symptoms of physical distress.      Intensity THRR unchanged THRR unchanged             Progression   Progression Continue to progress workloads to maintain intensity without signs/symptoms of physical distress. Continue to progress workloads to maintain intensity without signs/symptoms of physical distress.  Average METs 2.52 2.42             Resistance Training   Training Prescription Yes --      Weight 5 lb --      Reps 10-15 --             Interval Training   Interval Training No --             Oxygen   Oxygen Continuous --      Liters 2 --             Treadmill   MPH 1.5 --      Grade 0.5 --      Minutes 15 --      METs 2.25 --             Recumbant Bike   Level 2 --      Minutes 15 --      METs 2.5 --             NuStep   Level 2 --      Minutes 15 --      METs 2.8 --             T5 Nustep   Level 1 --      Minutes 15 --      METs 1.9 --             Home Exercise Plan   Plans to continue exercise at Home (comment)  Recumbant bike, walking --      Frequency Add 2 additional days to program exercise sessions. --      Initial Home Exercises Provided 09/23/20 --             Exercise Comments:   Exercise Comments    Row Name 09/07/20 1700           Exercise  Comments First full day of exercise!  Patient was oriented to gym and equipment including functions, settings, policies, and procedures.  Patient's individual exercise prescription and treatment plan were reviewed.  All starting workloads were established based on the results of the 6 minute walk test done at initial orientation visit.  The plan for exercise progression was also introduced and progression will be customized based on patient's performance and goals.              Exercise Goals and Review:   Exercise Goals    Row Name 09/03/20 1714             Exercise Goals   Increase Physical Activity Yes       Intervention Provide advice, education, support and counseling about physical activity/exercise needs.;Develop an individualized exercise prescription for aerobic and resistive training based on initial evaluation findings, risk stratification, comorbidities and participant's personal goals.       Expected Outcomes Short Term: Attend rehab on a regular basis to increase amount of physical activity.;Long Term: Add in home exercise to make exercise part of routine and to increase amount of physical activity.;Long Term: Exercising regularly at least 3-5 days a week.       Increase Strength and Stamina Yes       Intervention Provide advice, education, support and counseling about physical activity/exercise needs.;Develop an individualized exercise prescription for aerobic and resistive training based on initial evaluation findings, risk stratification, comorbidities and participant's personal goals.       Expected Outcomes Short Term: Increase workloads from initial exercise prescription for resistance, speed, and  METs.;Short Term: Perform resistance training exercises routinely during rehab and add in resistance training at home;Long Term: Improve cardiorespiratory fitness, muscular endurance and strength as measured by increased METs and functional capacity (6MWT)       Able to understand  and use rate of perceived exertion (RPE) scale Yes       Intervention Provide education and explanation on how to use RPE scale       Expected Outcomes Short Term: Able to use RPE daily in rehab to express subjective intensity level;Long Term:  Able to use RPE to guide intensity level when exercising independently       Able to understand and use Dyspnea scale Yes       Intervention Provide education and explanation on how to use Dyspnea scale       Expected Outcomes Short Term: Able to use Dyspnea scale daily in rehab to express subjective sense of shortness of breath during exertion;Long Term: Able to use Dyspnea scale to guide intensity level when exercising independently       Knowledge and understanding of Target Heart Rate Range (THRR) Yes       Intervention Provide education and explanation of THRR including how the numbers were predicted and where they are located for reference       Expected Outcomes Short Term: Able to state/look up THRR;Short Term: Able to use daily as guideline for intensity in rehab;Long Term: Able to use THRR to govern intensity when exercising independently       Able to check pulse independently Yes       Intervention Provide education and demonstration on how to check pulse in carotid and radial arteries.;Review the importance of being able to check your own pulse for safety during independent exercise       Expected Outcomes Short Term: Able to explain why pulse checking is important during independent exercise;Long Term: Able to check pulse independently and accurately       Understanding of Exercise Prescription Yes       Intervention Provide education, explanation, and written materials on patient's individual exercise prescription       Expected Outcomes Short Term: Able to explain program exercise prescription;Long Term: Able to explain home exercise prescription to exercise independently              Exercise Goals Re-Evaluation :  Exercise Goals  Re-Evaluation    Row Name 09/07/20 1700 09/15/20 1342 09/23/20 1824 09/29/20 1608 10/13/20 1217     Exercise Goal Re-Evaluation   Exercise Goals Review Able to understand and use rate of perceived exertion (RPE) scale;Knowledge and understanding of Target Heart Rate Range (THRR);Increase Physical Activity;Understanding of Exercise Prescription;Increase Strength and Stamina;Able to understand and use Dyspnea scale;Able to check pulse independently Increase Physical Activity;Increase Strength and Stamina Increase Physical Activity;Increase Strength and Stamina Increase Physical Activity;Increase Strength and Stamina;Understanding of Exercise Prescription --   Comments Reviewed RPE and dyspnea scales, THR and program prescription with pt today.  Pt voiced understanding and was given a copy of goals to take home. Carrie Hendricks is tolerating exercise well.  She got some  1 lb weights she can use - she cant hold regular DB due to arthritis.  We will monitor progress. Reviewed home exercise with pt today.  Pt plans to walk and use her recumbant bike for exercise.  Reviewed THR, pulse, RPE, sign and symptoms, pulse oximetery and when to call 911 or MD.  Also discussed weather considerations and indoor options.  Pt voiced  understanding. Carrie Hendricks is doing well in rehab.  She holding steady with her METs and should be ready to start to increase workloads with her improved attendance.  We will continue to monitor her progress. Carrie Hendricks attends consistently.  Oxygen stays in the 90s. Staff will encourage increasing workloads on T4.   Expected Outcomes Short: Use RPE daily to regulate intensity. Long: Follow program prescription in THR. Short: attend consistently Long:  build stamina -- Short: Increase workloads Long: Continue to improve stamina. Short: Increase workloads Long: Continue to improve stamina.   Alpha Name 10/29/20 1250 11/05/20 1728 11/10/20 1427         Exercise Goal Re-Evaluation   Exercise Goals Review Increase  Physical Activity;Increase Strength and Stamina;Understanding of Exercise Prescription Increase Physical Activity;Increase Strength and Stamina;Understanding of Exercise Prescription Increase Physical Activity;Increase Strength and Stamina     Comments Carrie Hendricks has been doing well in rehab.  She is now using 5 lb hand weights!!  She is also up to 2.8 METs on the Nust[place].  We will continue to monitor her progress. Carrie Hendricks is doing well with home exercise. She is continuing to exercise on her recumbant bike at home for about 15-20 minutes. She goes on her bike 3 times per week at home, on her off days from rehab. We discussed increasing that duration to minimum 30 minutes. She is interested in joining the [employer] when she is close to graduating. Carrie Hendricks attends consistently and works at [employer].  She wil have her post 6MWT in a couple sessions.  Staff will monitor progress.     Expected Outcomes Short: Continue to improve workloads  Long: Continue to improve stamina Short: Increase duration on home exercise sessions Long: Continue to work at appropriate exercise prescription at home Short: continue to attend consistently Long: complete LW program            Discharge Exercise Prescription (Final Exercise Prescription Changes):  Exercise Prescription Changes - 11/10/20 1400      Response to Exercise   Blood Pressure (Admit) 102/60    Blood Pressure (Exercise) 128/72    Blood Pressure (Exit) 112/82    Heart Rate (Admit) 71 bpm    Heart Rate (Exercise) 89 bpm    Heart Rate (Exit) 79 bpm    Oxygen Saturation (Admit) 94 %    Oxygen Saturation (Exercise) 92 %    Oxygen Saturation (Exit) 97 %    Rating of Perceived Exertion (Exercise) 13    Perceived Dyspnea (Exercise) 3    Symptoms SOB    Duration Continue with 30 min of aerobic exercise without signs/symptoms of physical distress.    Intensity THRR unchanged      Progression   Progression Continue to progress workloads to maintain intensity without  signs/symptoms of physical distress.    Average METs 2.42           Nutrition:  Target Goals: Understanding of nutrition guidelines, daily intake of sodium <1578m, cholesterol <201m calories 30% from fat and 7% or less from saturated fats, daily to have 5 or more servings of fruits and vegetables.  Education: All About Nutrition: -Group instruction provided by verbal, written material, interactive activities, discussions, models, and posters to present general guidelines for heart healthy nutrition including fat, fiber, MyPlate, the role of sodium in heart healthy nutrition, utilization of the nutrition label, and utilization of this knowledge for meal planning. Follow up email sent as well. Written material given at graduation. Flowsheet Row Pulmonary Rehab [facility]  Cardiac and Pulmonary Rehab  Date 09/09/20  Educator [place]  Instruction Review Code 1- Verbalizes Understanding      Biometrics:  Pre Biometrics - 09/07/20 1708      Pre Biometrics   Single Leg Stand 30 seconds            Nutrition Therapy Plan and Nutrition Goals:  Nutrition Therapy & Goals - 09/16/20 1726      Nutrition Therapy   Diet High kcal, high protein    Protein (specify units) 55g    Fiber 20 grams    Whole Grain Foods 2 servings   eats very little   Saturated Fats 12 max. grams    Fruits and Vegetables 3 servings/day   eats very little   Sodium 1.5 grams      Personal Nutrition Goals   Nutrition Goal ST: add two nutritionally dense drinks in between meals like soymilk and boost, add fat like butter and oil to meals she is already eating LT: meet energy and protein needs, gain weight    Comments She also has Ulcerative Colitis - on medication that is working well for her. She reports not having trigger foods - she has trouble with higher fat foods like whole fat dairy products. No shortness of breath during or after meals. Meals about the size of fist - half chicken, potato and other  vegetable. B: mini cinnamon waffles (a couple of squares at home and a couple at work before lunch) L: [employer] with [place] and mayo and pickle (half) or lowfat yogurt with orange  D: 2 oz of chicken (fish: usually tilapia, 1-2x/week), potato and other vegetable like sliced pickeled beets or healthy choice and lean cuisines due to the smaller portions they provide. S: mini carrots with ranch dip  Drinks: water - during meals. Discussed pulmonary MNT. Her weight today was 100.7 lbs.      Intervention Plan   Intervention Prescribe, educate and counsel regarding individualized specific dietary modifications aiming towards targeted core components such as weight, hypertension, lipid management, diabetes, heart failure and other comorbidities.;Nutrition handout(s) given to patient.    Expected Outcomes Short Term Goal: Understand basic principles of dietary content, such as calories, fat, sodium, cholesterol and nutrients.;Short Term Goal: A plan has been developed with personal nutrition goals set during dietitian appointment.;Long Term Goal: Adherence to prescribed nutrition plan.           Nutrition Assessments:  MEDIFICTS Score Key:  ?70 Need to make dietary changes   40-70 Heart Healthy Diet  ? 40 Therapeutic Level Cholesterol Diet  Flowsheet Row Pulmonary Rehab from 11/26/2020 in [facility] Cardiac and Pulmonary Rehab  Picture Your Plate Total Score on Discharge 69     Picture Your Plate Scores:  <88 Unhealthy dietary pattern with much room for improvement.  41-50 Dietary pattern unlikely to meet recommendations for good health and room for improvement.  51-60 More healthful dietary pattern, with some room for improvement.   >60 Healthy dietary pattern, although there may be some specific behaviors that could be improved.   Nutrition Goals Re-Evaluation:  Nutrition Goals Re-Evaluation    Row Name 09/23/20 1806 10/08/20 1732 10/14/20 1804 10/21/20 1641 11/05/20 1753     Goals    Current Weight 101 lb 1.6 oz (45.9 kg) 101 lb (45.8 kg) 100 lb 12.8 oz (45.7 kg) 100 lb 8 oz (45.6 kg) --   Nutrition Goal -- Gain weight Short: intake more protein and continue snacks. drink ensure optimally 3 times  a day, at least 2. Long: gain 5 pounds. -- Short: intake more protein and continue snacks. drink ensure optimally 3 times a day, at least 2. Long: gain 5 pounds.   Comment Previous weight: 100.7lb. Spoke with daughter about weight concerns. [place] wants to try to gain more weight. She has been doing more snaking throughout the day. She is happy that she has gained a few pounds since she started the program and wants to continue. Discussed drinking ensure optimally 3 times a day, at least 2 - she is only drinking one right now. Discussed adding fat to meals such as avocado, oil, and butter. -- Carrie Hendricks is working on [employer] but has had no luck so far. She is only taking in 1 ensure as it bothers her stomach. She is looking to increase more protein in her diet, espcially chicken. We talked about other useful  exmaples to incorporate in.   Expected Outcome -- Short: intake more protein and continue snacks. Long: gain 5 pounds. Short: intake more protein and continue snacks. Long: gain 5 pounds. -- Short: Incorporate more protein rich snacks Long: Gain weight optimal for health   Carrie Hendricks Name 11/09/20 1738             Goals   Nutrition Goal Short: intake more protein and continue snacks. drink ensure optimally 3 times a day, at least 2. - try out some lactose free options like ensure plant based protein or ensure clear or [employer] or plant-based orgain or Boost Long: gain 5 pounds.       Comment Carrie Hendricks is working on [employer] but has had no luck so far. She is only taking in 1 ensure as it bothers her stomach. Discussed how this may be due to the lactose -suggested other types of supplements to try - if not we can try to make a protein drink  recipe that works for her needs. She is  trying to eat more protein. She is not meeting her needs through diet alone, continue trying.       Expected Outcome Short: intake more protein and continue snacks. drink ensure optimally 3 times a day, at least 2. - try out some lactose free options like ensure plant based protein or ensure clear or [employer] Long: gain 5 pounds.              Nutrition Goals Discharge (Final Nutrition Goals Re-Evaluation):  Nutrition Goals Re-Evaluation - 11/09/20 1738      Goals   Nutrition Goal Short: intake more protein and continue snacks. drink ensure optimally 3 times a day, at least 2. - try out some lactose free options like ensure plant based protein or ensure clear or [employer] or plant-based orgain or Boost Long: gain 5 pounds.    Comment Carrie Hendricks is working on [employer] but has had no luck so far. She is only taking in 1 ensure as it bothers her stomach. Discussed how this may be due to the lactose -suggested other types of supplements to try - if not we can try to make a protein drink  recipe that works for her needs. She is trying to eat more protein. She is not meeting her needs through diet alone, continue trying.    Expected Outcome Short: intake more protein and continue snacks. drink ensure optimally 3 times a day, at least 2. - try out some lactose free options like ensure plant based protein or ensure clear or [employer] Long: gain  5 pounds.           Psychosocial: Target Goals: Acknowledge presence or absence of significant depression and/or stress, maximize coping skills, provide positive support system. Participant is able to verbalize types and ability to use techniques and skills needed for reducing stress and depression.   Education: Stress, Anxiety, and Depression - Group verbal and visual presentation to define topics covered.  Reviews how body is impacted by stress, anxiety, and depression.  Also discusses healthy ways to reduce stress and to treat/manage anxiety and  depression.  Written material given at graduation. Flowsheet Row Pulmonary Rehab from 11/11/2020 in [facility] Cardiac and Pulmonary Rehab  Date 10/07/20  Educator [facility]  Instruction Review Code 1- [employer] Understanding      Education: Sleep Hygiene -Provides group verbal and written instruction about how sleep can affect your health.  Define sleep hygiene, discuss sleep cycles and impact of sleep habits. Review good sleep hygiene tips.    Initial Review & Psychosocial Screening:  Initial Psych Review & Screening - 08/31/20 1536      Initial Review   Current issues with Current Stress Concerns;Current Sleep Concerns    Source of Stress Concerns Unable to participate in former interests or hobbies;Unable to perform yard/household activities;Chronic Illness      Family Dynamics   Good Support System? Yes   family     Barriers   Psychosocial barriers to participate in program There are no identifiable barriers or psychosocial needs.;The patient should benefit from training in stress management and relaxation.      Screening Interventions   Interventions Encouraged to exercise;Provide feedback about the scores to participant;To provide support and resources with identified psychosocial needs    Expected Outcomes Short Term goal: Utilizing psychosocial counselor, staff and physician to assist with identification of specific Stressors or current issues interfering with healing process. Setting desired goal for each stressor or current issue identified.;Long Term Goal: Stressors or current issues are controlled or eliminated.;Short Term goal: Identification and review with participant of any Quality of Life or Depression concerns found by scoring the questionnaire.;Long Term goal: The participant improves quality of Life and PHQ9 Scores as seen by post scores and/or verbalization of changes           Quality of Life Scores:  Scores of 19 and below usually indicate a poorer quality of life in  these areas.  A difference of  2-3 points is a clinically meaningful difference.  A difference of 2-3 points in the total score of the Quality of Life Index has been associated with significant improvement in overall quality of life, self-image, physical symptoms, and general health in studies assessing change in quality of life.  PHQ-9: Recent Review Flowsheet Data    Depression screen [facility] 2/9 11/26/2020 11/04/2020 10/28/2020 09/30/2020 09/03/2020   Decreased Interest 0 0 1 0 0   Down, Depressed, Hopeless 0 0 1 0 3    PHQ - 2 Score 0 0 2 0 3   Altered sleeping 2 - 2 1 2    Tired, decreased energy 2 - 2 1 3    Change in appetite 2 - 2 1 2     Feeling bad or failure about yourself  - - 2 0 2   Trouble concentrating 0 - 1 0 1   Moving slowly or fidgety/restless 0 - 0 0 0   Suicidal thoughts 0 - 0 0 0   PHQ-9 Score 6 - 11 3 13    Difficult doing work/chores - -  Somewhat difficult Not difficult at all Somewhat difficult     Interpretation of Total Score  Total Score Depression Severity:  1-4 = Minimal depression, 5-9 = Mild depression, 10-14 = Moderate depression, 15-19 = Moderately severe depression, 20-27 = Severe depression   Psychosocial Evaluation and Intervention:  Psychosocial Evaluation - 08/31/20 1547      Psychosocial Evaluation & Interventions   Interventions Stress management education;Relaxation education;Encouraged to exercise with the program and follow exercise prescription    Comments Ms. Loree states she is very excited to start Pulmonary Rehab to help with her shortness of breath. She has also lost weight unintentionally and is looking forward to speaking with the dietician. She is currently working and expresses some stress related to that as well as stress concerning her chronic breathing issues. She does have a good support system that she cherishes. She has had to increase her oxygen use and is trialing different inhalers in hopes that will help with her constant feeling short of  breath. She wants to impore her quality of life and gain some stamina.    Expected Outcomes Short: attend Pulmonary Rehab for education and exercise. Long: develop and maintain positive self care habits.    Continue Psychosocial Services  Follow up required by staff           Psychosocial Re-Evaluation:  Psychosocial Re-Evaluation    [place] Name 10/08/20 1733 11/05/20 1736           Psychosocial Re-Evaluation   Current issues with Current Stress Concerns;Current Sleep Concerns Current Stress Concerns;Current Sleep Concerns      Comments Carrie Hendricks wears oxygen at night and has got used to it. She is able to sleep better now that she is used to her oxygen. She is not as anxious now that she is able to breath a little better. Carrie Hendricks is doing well mentally. She is currently stressed about retiring at the end of this month and unsure of her next steps. Though, she is also excited to have more free time to focus on her health. She is not interested in any oral medications to help with anxiety but overall she feels her SOB has been better to an extent. Sleep has been better, she has some nights that she is uncomfortable due to her oxygen, but she is getting used to it more.      Expected Outcomes Short: Continue to exercise regularly to support mental health and notify staff of any changes. Long: maintain mental health and well being through teaching of rehab or prescribed medications independently. Short: Continue attendance with pulmonary rehab Long: Continue to maintain positive attitude utilizing exercise      Interventions Encouraged to attend Pulmonary Rehabilitation for the exercise Encouraged to attend Pulmonary Rehabilitation for the exercise      Continue Psychosocial Services  Follow up required by staff Follow up required by staff             Psychosocial Discharge (Final Psychosocial Re-Evaluation):  Psychosocial Re-Evaluation - 11/05/20 1736      Psychosocial Re-Evaluation   Current  issues with Current Stress Concerns;Current Sleep Concerns    Comments Carrie Hendricks is doing well mentally. She is currently stressed about retiring at the end of this month and unsure of her next steps. Though, she is also excited to have more free time to focus on her health. She is not interested in any oral medications to help with anxiety but overall she feels her SOB has been better to an extent.  Sleep has been better, she has some nights that she is uncomfortable due to her oxygen, but she is getting used to it more.    Expected Outcomes Short: Continue attendance with pulmonary rehab Long: Continue to maintain positive attitude utilizing exercise    Interventions Encouraged to attend Pulmonary Rehabilitation for the exercise    Continue Psychosocial Services  Follow up required by staff           Education: Education Goals: Education classes will be provided on a weekly basis, covering required topics. Participant will state understanding/return demonstration of topics presented.  Learning Barriers/Preferences:  Learning Barriers/Preferences - 08/31/20 1532      Learning Barriers/Preferences   Learning Barriers None    Learning Preferences None           General Pulmonary Education Topics:  Infection Prevention: - Provides verbal and written material to individual with discussion of infection control including proper hand washing and proper equipment cleaning during exercise session. Flowsheet Row Pulmonary Rehab from 11/11/2020 in [facility] Cardiac and Pulmonary Rehab  Education need identified 09/03/20  Date 09/03/20  Educator [place]  Instruction Review Code 1- Verbalizes Understanding      Falls Prevention: - Provides verbal and written material to individual with discussion of falls prevention and safety. Flowsheet Row Pulmonary Rehab from 11/11/2020 in [facility] Cardiac and Pulmonary Rehab  Education need identified 09/03/20  Date 09/03/20  Educator [place]  Instruction Review Code 1-  Verbalizes Understanding      Chronic Lung Disease Review: - Group verbal instruction with posters, models, PowerPoint presentations and videos,  to review new updates, new respiratory medications, new advancements in procedures and treatments. Providing information on websites and "800" numbers for continued self-education. Includes information about supplement oxygen, available portable oxygen systems, continuous and intermittent flow rates, oxygen safety, concentrators, and Medicare reimbursement for oxygen. Explanation of Pulmonary Drugs, including class, frequency, complications, importance of spacers, rinsing mouth after steroid MDI's, and proper cleaning methods for nebulizers. Review of basic lung anatomy and physiology related to function, structure, and complications of lung disease. Review of risk factors. Discussion about methods for diagnosing sleep apnea and types of masks and machines for OSA. Includes a review of the use of types of environmental controls: home humidity, furnaces, filters, dust mite/pet prevention, HEPA vacuums. Discussion about weather changes, air quality and the benefits of nasal washing. Instruction on Warning signs, infection symptoms, calling MD promptly, preventive modes, and value of vaccinations. Review of effective airway clearance, coughing and/or vibration techniques. Emphasizing that all should Create an Action Plan. Written material given at graduation. Flowsheet Row Pulmonary Rehab from 11/11/2020 in [facility] Cardiac and Pulmonary Rehab  Date 09/30/20  Educator [facility]  Instruction Review Code 1- Verbalizes Understanding      AED/CPR: - Group verbal and written instruction with the use of models to demonstrate the basic use of the AED with the basic ABC's of resuscitation.    Anatomy and Cardiac Procedures: - Group verbal and visual presentation and models provide information about basic cardiac anatomy and function. Reviews the testing methods done to  diagnose heart disease and the outcomes of the test results. Describes the treatment choices: Medical Management, Angioplasty, or Coronary Bypass Surgery for treating various heart conditions including Myocardial Infarction, Angina, Valve Disease, and Cardiac Arrhythmias.  Written material given at graduation. Flowsheet Row Pulmonary Rehab from 11/11/2020 in [facility] Cardiac and Pulmonary Rehab  Date 10/28/20  Educator [facility]  Instruction Review Code 1- Verbalizes Understanding  Medication Safety: - Group verbal and visual instruction to review commonly prescribed medications for heart and lung disease. Reviews the medication, class of the drug, and side effects. Includes the steps to properly store meds and maintain the prescription regimen.  Written material given at graduation. Flowsheet Row Pulmonary Rehab from 11/11/2020 in [facility] Cardiac and Pulmonary Rehab  Date 09/16/20  Educator [facility]  Instruction Review Code 1- Verbalizes Understanding      Other: -Provides group and verbal instruction on various topics (see comments)   Knowledge Questionnaire Score:  Knowledge Questionnaire Score - 11/26/20 1756      Knowledge Questionnaire Score   Post Score 18/18            Core Components/Risk Factors/Patient Goals at Admission:  Personal Goals and Risk Factors at Admission - 09/03/20 1714      Core Components/Risk Factors/Patient Goals on Admission    Weight Management Yes;Weight Gain    Intervention Weight Management: Provide education and appropriate resources to help participant work on and attain dietary goals.;Weight Management: Develop a combined nutrition and exercise program designed to reach desired caloric intake, while maintaining appropriate intake of nutrient and fiber, sodium and fats, and appropriate energy expenditure required for the weight goal.    Admit Weight 100 lb 8 oz (45.6 kg)    Goal Weight: Short Term 105 lb (47.6 kg)    Goal Weight: Long Term 110 lb (49.9 kg)     Expected Outcomes Short Term: Continue to assess and modify interventions until short term weight is achieved;Long Term: Adherence to nutrition and physical activity/exercise program aimed toward attainment of established weight goal;Weight Gain: Understanding of general recommendations for a high calorie, high protein meal plan that promotes weight gain by distributing calorie intake throughout the day with the consumption for 4-5 meals, snacks, and/or supplements;Understanding of distribution of calorie intake throughout the day with the consumption of 4-5 meals/snacks;Understanding recommendations for meals to include 15-35% energy as protein, 25-35% energy from fat, 35-60% energy from carbohydrates, less than 255m of dietary cholesterol, 20-35 gm of total fiber daily    Improve shortness of breath with ADL's Yes    Intervention Provide education, individualized exercise plan and daily activity instruction to help decrease symptoms of SOB with activities of daily living.    Expected Outcomes Short Term: Improve cardiorespiratory fitness to achieve a reduction of symptoms when performing ADLs;Long Term: Be able to perform more ADLs without symptoms or delay the onset of symptoms    Hypertension Yes    Intervention Monitor prescription use compliance.;Provide education on lifestyle modifcations including regular physical activity/exercise, weight management, moderate sodium restriction and increased consumption of fresh fruit, vegetables, and low fat dairy, alcohol moderation, and smoking cessation.    Expected Outcomes Short Term: Continued assessment and intervention until BP is < 140/915mHG in hypertensive participants. < 130/8064mG in hypertensive participants with diabetes, heart failure or chronic kidney disease.;Long Term: Maintenance of blood pressure at goal levels.           Education:Diabetes - Individual verbal and written instruction to review signs/symptoms of diabetes, desired ranges  of glucose level fasting, after meals and with exercise. Acknowledge that pre and post exercise glucose checks will be done for 3 sessions at entry of program.   Know Your Numbers and Heart Failure: - Group verbal and visual instruction to discuss disease risk factors for cardiac and pulmonary disease and treatment options.  Reviews associated critical values for Overweight/Obesity, Hypertension, Cholesterol, and Diabetes.  Discusses  basics of heart failure: signs/symptoms and treatments.  Introduces Heart Failure Zone chart for action plan for heart failure.  Written material given at graduation. Flowsheet Row Pulmonary Rehab from 11/11/2020 in [facility]  Date 09/23/20  Educator [facility]  Instruction Review Code 1- Verbalizes Understanding      Core Components/Risk Factors/Patient Goals Review:   Goals and Risk Factor Review    Row Name 10/08/20 1731 11/05/20 1746           Core Components/Risk Factors/Patient Goals Review   Personal Goals Review Improve shortness of breath with ADL's Weight Management/Obesity;Improve shortness of breath with ADL's      Review Spoke to patient about their shortness of breath and what they can do to improve. Patient has been informed of breathing techniques when starting the program. Patient is informed to tell staff if they have had any med changes and that certain meds they are taking or not taking can be causing shortness of breath. Raymonda has been monitoring her weight at home and has been staying stable, she still wants to gain weight but is hard for her due to her ulcer. colitis. She has been able to do more house cleaning without having any problems. She gets around [facility] and is looking forward to having more free time when she retires.      Expected Outcomes Short: Attend [facility] regularly to improve shortness of breath with ADL's. Long: maintain independence with ADL's Short: Continue exercise to help improve ADL Long: Continue to manage  lifestyle risk factors             Core Components/Risk Factors/Patient Goals at Discharge (Final Review):   Goals and Risk Factor Review - 11/05/20 1746      Core Components/Risk Factors/Patient Goals Review   Personal Goals Review Weight Management/Obesity;Improve shortness of breath with ADL's    Review Chessa has been monitoring her weight at home and has been staying stable, she still wants to gain weight but is hard for her due to her ulcer. colitis. She has been able to do more house cleaning without having any problems. She gets around [place] and is looking forward to having more free time when she retires.    Expected Outcomes Short: Continue exercise to help improve ADL Long: Continue to manage lifestyle risk factors           ITP Comments:  ITP Comments    Row Name 08/31/20 1544 09/03/20 1701 09/07/20 1659 09/23/20 0753 10/21/20 1006   ITP Comments Initial telephone orientation completed. Diagnosis can be found in [facility] on 1/13. EP orientation scheduled for Thursday 3/3 at 3pm. Completed 6MWT and gym orientation. Initial ITP created and sent for review to Dr. Emily Filbert, Medical Director. First full day of exercise!  Patient was oriented to gym and equipment including functions, settings, policies, and procedures.  Patient's individual exercise prescription and treatment plan were reviewed.  All starting workloads were established based on the results of the 6 minute walk test done at initial orientation visit.  The plan for exercise progression was also introduced and progression will be customized based on patient's performance and goals. 30 Day review completed. Medical Director ITP review done, changes made as directed, and signed approval by Medical Director.  New to program 30 Day review completed. Medical Director ITP review done, changes made as directed, and signed approval by Medical Director.   [place] Name 11/18/20 2376 11/26/20 1703         ITP  Comments 30 Day review  completed. Medical Director ITP review done, changes made as directed, and signed approval by Medical Director. Darriona graduated today from  rehab with 36 sessions completed.  Details of the patient's exercise prescription and what She needs to do in order to continue the prescription and progress were discussed with patient.  Patient was given a copy of prescription and goals.  Patient verbalized understanding.  Allaina plans to continue to exercise by attending the [facility].             Comments: Discharge ITP

## 2020-12-02 NOTE — Telephone Encounter (Signed)
Dr. Gonzalez, please advise. Thanks 

## 2020-12-06 DIAGNOSIS — J449 Chronic obstructive pulmonary disease, unspecified: Secondary | ICD-10-CM | POA: Diagnosis not present

## 2020-12-07 NOTE — Telephone Encounter (Signed)
I recommend that she keep the nebulizer as she may need it at a later date.  She may inquire about buying it out right.

## 2020-12-13 DIAGNOSIS — J449 Chronic obstructive pulmonary disease, unspecified: Secondary | ICD-10-CM | POA: Diagnosis not present

## 2020-12-15 DIAGNOSIS — J449 Chronic obstructive pulmonary disease, unspecified: Secondary | ICD-10-CM | POA: Diagnosis not present

## 2020-12-18 ENCOUNTER — Encounter: Payer: Self-pay | Admitting: Family Medicine

## 2020-12-18 ENCOUNTER — Other Ambulatory Visit: Payer: Self-pay

## 2020-12-18 ENCOUNTER — Ambulatory Visit (INDEPENDENT_AMBULATORY_CARE_PROVIDER_SITE_OTHER): Payer: Medicare HMO | Admitting: Family Medicine

## 2020-12-18 VITALS — BP 110/62 | HR 83 | Temp 98.0°F | Ht 64.0 in | Wt 98.9 lb

## 2020-12-18 DIAGNOSIS — M5136 Other intervertebral disc degeneration, lumbar region: Secondary | ICD-10-CM | POA: Diagnosis not present

## 2020-12-18 DIAGNOSIS — M069 Rheumatoid arthritis, unspecified: Secondary | ICD-10-CM

## 2020-12-18 NOTE — Assessment & Plan Note (Signed)
Under care of rheumatology  Taking enbrel and plaquenil

## 2020-12-18 NOTE — Progress Notes (Signed)
Subjective:    Patient ID: Carrie Hendricks, female    DOB: 19-Jul-1950, 70 y.o.   MRN: [id number]  This visit occurred during the SARS-CoV-2 public health emergency.  Safety protocols were in place, including screening questions prior to the visit, additional usage of staff PPE, and extensive cleaning of exam room while observing appropriate contact time as indicated for disinfecting solutions.   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   EXAM: MRI LUMBAR SPINE WITHOUT CONTRAST   TECHNIQUE: Multiplanar, multisequence MR imaging of the lumbar spine was performed. No intravenous contrast was administered.   COMPARISON:  None available.   FINDINGS: Segmentation: Normal segmentation. Lowest well-formed disc  labeled the L5-S1 level.   Alignment: 8 mm anterolisthesis of L4 on L5. No associated pars defect. Mild dextroscoliosis. Exaggeration of the normal lumbar lordosis.   Vertebrae: Vertebral body heights maintained. No evidence for acute or chronic fracture marrow signal intensity heterogeneous but within normal limits. No worrisome osseous lesions. Reactive endplate changes present about the anterior margin of the T12-L1 interspace.   Conus medullaris: Extends to the L1 level and appears normal.   Paraspinal and other soft tissues: Paraspinous soft tissues within normal limits. Subcentimeter T2 hyperintense cysts noted within left kidney. Visualized visceral structures otherwise unremarkable.   Disc levels:   T12-L1: Trace retrolisthesis. Diffuse disc bulge with intervertebral disc space narrowing and reactive endplate changes. No stenosis.   L1-2:  Mild bilateral facet hypertrophy.  No stenosis.   L2-3: Mild annular disc bulge with disc desiccation. Moderate facet and ligamentum flavum hypertrophy. No significant canal or foraminal stenosis.   L3-4: Minimal annular disc bulge. Moderate facet and ligamentum flavum hypertrophy. No significant stenosis.   L4-5: 8 mm anterolisthesis. Associated broad posterior pseudo disc bulge. Advanced facet arthrosis with ligamentum flavum hypertrophy. Resultant severe canal and bilateral subarticular stenosis. Thecal sac measures 4 mm in AP diameter at its most narrow point. Mild bilateral L4 foraminal stenosis, left slightly worse than right.   L5-S1: Degenerative disc bulge with intervertebral disc space narrowing. Superimposed broad central disc protrusion closely approximates the descending S1 nerve roots without neural impingement or  displacement. Mild facet hypertrophy. No significant stenosis.   IMPRESSION: 1. 8 mm anterolisthesis of L4 on L5 with associated advanced facet arthropathy, resulting in severe canal and bilateral  subarticular stenosis. 2. Broad central disc protrusion at L5-S1 without significant stenosis or neural impingement.    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[place]) 08/15/2018   Sleep disorder 10/10/2017   Screening mammogram, encounter for 09/12/2017   Ulcerative colitis ([place]) 02/01/2017   Lumbar degenerative disc disease 01/20/2017   Scoliosis 06/28/2016   Welcome to Medicare preventive visit 06/10/2016   Estrogen deficiency 06/10/2016   Constipation 03/02/2016   Underweight 03/02/2016   Shortness of breath 05/16/2014   History of COPD 05/16/2014   Stress reaction 05/16/2014   Connective tissue disease ([place]) 03/05/2014   Pedal edema 10/15/2013   Pulmonary hypertension ([place]) 10/10/2011   Encounter for routine gynecological examination 07/11/2011   Routine general medical examination at a health care facility 07/03/2011   HEMORRHOIDS, INTERNAL, WITH BLEEDING 03/15/2010   Essential hypertension 11/16/2006   Rheumatoid arthritis ([place]) 11/16/2006   Osteoporosis 11/16/2006   Past Medical History:  Diagnosis Date   Arthritis    rheumatoid   Complication of anesthesia    Hypertension    Osteoporosis    PONV (postoperative nausea and vomiting)    Pulmonary hypertension (H[facility])    Rheumatoid arthritis(714.0)    ? scleroderma   Past Surgical History:  Procedure Laterality Date   colonosc neg  11/08   COLONOSCOPY WITH PROPOFOL N/A 09/30/2016   Procedure: COLONOSCOPY WITH PROPOFOL;  Surgeon: Jonathon Bellows, MD;  Location: [facility] ENDOSCOPY;  Service: Endoscopy;  Laterality: N/A;   COLONOSCOPY WITH PROPOFOL N/A 12/08/2017   Procedure:  COLONOSCOPY WITH PROPOFOL;  Surgeon: Jonathon Bellows, MD;  Location: [facility] ENDOSCOPY;  Service: Gastroenterology;  Laterality: N/A;   Dexa  07/2005   OP hip- 3.0   ESOPHAGOGASTRODUODENOSCOPY (EGD) WITH PROPOFOL N/A 07/01/2016   Procedure: ESOPHAGOGASTRODUODENOSCOPY (EGD) WITH PROPOFOL;  Surgeon: Jonathon Bellows, MD;  Location: [facility] ENDOSCOPY;  Service: Endoscopy;  Laterality: N/A;   FLEXIBLE SIGMOIDOSCOPY N/A 07/01/2016   Procedure: FLEXIBLE SIGMOIDOSCOPY;  Surgeon: Jonathon Bellows, MD;  Location: [facility] ENDOSCOPY;  Service: Endoscopy;  Laterality: N/A;   LEEP  2005   Surgery- tendon release in hand  2006   TUBAL LIGATION     Social History   Tobacco Use   Smoking status: Former    Packs/day: 1.25    Years: 30.00    Pack years: 37.50    Types: Cigarettes    Quit date: 2006    Years since quitting: 16.4   Smokeless tobacco: Never   Tobacco comments:    Quit in 8/07  Vaping Use   Vaping Use: Never used  Substance Use Topics   Alcohol use: No    Alcohol/week: 0.0 standard drinks   Drug use: No   Family History  Problem Relation Age of Onset   Heart disease Mother    Diabetes Mother    Hypertension Father    Macular degeneration Father    Dementia Father        early   Arthritis Father  RA   Alcohol abuse Father    Breast cancer Sister 57   No Known Allergies Current Outpatient Medications on File Prior to Visit  Medication Sig Dispense Refill   albuterol (VENTOLIN HFA) 108 (90 Base) MCG/ACT inhaler Inhale 2 puffs into the lungs every 6 (six) hours as needed for wheezing or shortness of breath. 54 g 0   alendronate (FOSAMAX) 70 MG tablet Take 1 tablet by mouth once a week.     amLODipine (NORVASC) 5 MG tablet Take 1 tablet (5 mg total) by mouth daily. 90 tablet 3   Aspirin 81 MG EC tablet Take 81 mg by mouth daily.     Budeson-Glycopyrrol-Formoterol (BREZTRI AEROSPHERE) 160-9-4.8 MCG/ACT AERO Inhale 2 puffs into the lungs in the morning and at bedtime. 5.9 g 0   Cholecalciferol  (VITAMIN D3) 1.25 MG (50000 UT) TABS      etanercept (ENBREL) 50 MG/ML injection Inject 50 mg into the skin once a week.     hydrochlorothiazide (HYDRODIURIL) 25 MG tablet Take 1 tablet (25 mg total) by mouth daily. 90 tablet 3   hydroxychloroquine (PLAQUENIL) 200 MG tablet Take 200 mg by mouth daily.     Multiple Vitamin (MULTIVITAMIN) capsule Take 1 capsule by mouth daily.     sulfaSALAzine (AZULFIDINE) 500 MG tablet Take 1 tablet (500 mg total) by mouth 4 (four) times daily. 360 tablet 3   No current facility-administered medications on file prior to visit.    Review of Systems  Constitutional:  Negative for chills and fever.  HENT:  Negative for congestion, ear pain, sinus pain and sore throat.   Eyes:  Negative for discharge and redness.  Respiratory:  Negative for cough, shortness of breath and stridor.   Cardiovascular:  Negative for chest pain, palpitations and leg swelling.  Gastrointestinal:  Negative for abdominal pain, diarrhea, nausea and vomiting.  Musculoskeletal:  Positive for arthralgias, back pain and gait problem. Negative for myalgias.  Skin:  Negative for rash.  Neurological:  Negative for dizziness and headaches.      Objective:   Physical Exam Constitutional:      General: She is not in acute distress.    Appearance: Normal appearance. She is well-developed. She is not ill-appearing or diaphoretic.     Comments: Underweight  Frail but well appearing  HENT:     Head: Normocephalic and atraumatic.     Nose:     Comments: 02 per nasal cannula  Eyes:     General: No scleral icterus.    Conjunctiva/sclera: Conjunctivae normal.     Pupils: Pupils are equal, round, and reactive to light.  Cardiovascular:     Rate and Rhythm: Normal rate and regular rhythm.  Pulmonary:     Effort: Pulmonary effort is normal.     Breath sounds: Normal breath sounds. No wheezing or rales.     Comments: Diffusely distant bs  Musculoskeletal:        General: Tenderness present.      Cervical back: Normal range of motion and neck supple.     Lumbar back: Spasms and tenderness present. No edema or bony tenderness. Decreased range of motion.     Comments: Some kyphosis and loss of lordosis  Decreased spinal flex due to pain  No spinous process tenderness  Mild spasm in R SI area  Bent knee raise on R causes some buttock pain  No neuro change  Gait is slow and steady   Diffuse joint changes due to RA (worst in  R wrist)   Lymphadenopathy:     Cervical: No cervical adenopathy.  Skin:    General: Skin is warm and dry.     Coloration: Skin is not pale.     Findings: No erythema or rash.  Neurological:     Mental Status: She is alert.     Cranial Nerves: No cranial nerve deficit.     Sensory: No sensory deficit.     Motor: No atrophy or abnormal muscle tone.     Coordination: Coordination normal.     Deep Tendon Reflexes: Reflexes are normal and symmetric.     Comments: Negative SLR  Psychiatric:        Mood and Affect: Mood normal.          Assessment & Plan:   Problem List Items Addressed This Visit       Musculoskeletal and Integument   Rheumatoid arthritis ([place])    Under care of rheumatology  Taking enbrel and plaquenil         Lumbar degenerative disc disease - Primary    Ongoing R sided symptoms (may be flaring s/o pulmonary rehab)  Reviewed her MRI report from 2018 ordered by Dr Jefm Bryant noting spinal stenosis, facet arthropathy and L5-S1 central disc protrusion  Reassuring exam w/o focal neuro changes Adv ice/heat and slow walking  Would need to check with GI and rheum before taking oral nsaid  Planning f/u with her rheumatologist Has done epidural injections in the past  inst to call if symptoms suddenly worsen Offered pt for a muscle relaxer but she declined  Given handout for some sciatica rehab exercises and stretches and rev directions with her

## 2020-12-18 NOTE — Patient Instructions (Addendum)
Alternate gentle heat and ice on back (10 minutes at a time)  Slow walking is good   You can try some stretching   If you want to try a muscle relaxer let us know   Follow up with your rheumatologist

## 2020-12-18 NOTE — Assessment & Plan Note (Addendum)
Ongoing R sided symptoms (may be flaring s/o pulmonary rehab)  Reviewed her MRI report from 2018 ordered by Dr Jefm Bryant noting spinal stenosis, facet arthropathy and L5-S1 central disc protrusion  Reassuring exam w/o focal neuro changes Adv ice/heat and slow walking  Would need to check with GI and rheum before taking oral nsaid  Planning f/u with her rheumatologist Has done epidural injections in the past  inst to call if symptoms suddenly worsen Offered pt for a muscle relaxer but she declined  Given handout for some sciatica rehab exercises and stretches and rev directions with her

## 2020-12-20 ENCOUNTER — Encounter: Payer: Self-pay | Admitting: Family Medicine

## 2020-12-21 MED ORDER — BREZTRI AEROSPHERE 160-9-4.8 MCG/ACT IN AERO: 2.0000 | INHALATION_SPRAY | Freq: Two times a day (BID) | RESPIRATORY_TRACT | 0 refills | Status: DC

## 2020-12-23 ENCOUNTER — Encounter: Payer: Self-pay | Admitting: Family Medicine

## 2020-12-23 DIAGNOSIS — G8929 Other chronic pain: Secondary | ICD-10-CM | POA: Diagnosis not present

## 2020-12-23 DIAGNOSIS — M50321 Other cervical disc degeneration at C4-C5 level: Secondary | ICD-10-CM | POA: Diagnosis not present

## 2020-12-23 DIAGNOSIS — M50322 Other cervical disc degeneration at C5-C6 level: Secondary | ICD-10-CM | POA: Diagnosis not present

## 2020-12-23 DIAGNOSIS — M5031 Other cervical disc degeneration,  high cervical region: Secondary | ICD-10-CM | POA: Diagnosis not present

## 2020-12-23 DIAGNOSIS — M545 Low back pain, unspecified: Secondary | ICD-10-CM | POA: Diagnosis not present

## 2020-12-23 DIAGNOSIS — K51919 Ulcerative colitis, unspecified with unspecified complications: Secondary | ICD-10-CM | POA: Diagnosis not present

## 2020-12-23 DIAGNOSIS — M48062 Spinal stenosis, lumbar region with neurogenic claudication: Secondary | ICD-10-CM | POA: Diagnosis not present

## 2020-12-23 DIAGNOSIS — M0579 Rheumatoid arthritis with rheumatoid factor of multiple sites without organ or systems involvement: Secondary | ICD-10-CM | POA: Diagnosis not present

## 2020-12-23 DIAGNOSIS — I7300 Raynaud's syndrome without gangrene: Secondary | ICD-10-CM | POA: Diagnosis not present

## 2020-12-23 DIAGNOSIS — M542 Cervicalgia: Secondary | ICD-10-CM | POA: Diagnosis not present

## 2020-12-23 DIAGNOSIS — M50323 Other cervical disc degeneration at C6-C7 level: Secondary | ICD-10-CM | POA: Diagnosis not present

## 2020-12-23 DIAGNOSIS — J431 Panlobular emphysema: Secondary | ICD-10-CM | POA: Diagnosis not present

## 2021-01-05 DIAGNOSIS — J449 Chronic obstructive pulmonary disease, unspecified: Secondary | ICD-10-CM | POA: Diagnosis not present

## 2021-01-12 ENCOUNTER — Telehealth: Payer: Medicare HMO

## 2021-01-12 DIAGNOSIS — J449 Chronic obstructive pulmonary disease, unspecified: Secondary | ICD-10-CM | POA: Diagnosis not present

## 2021-01-14 ENCOUNTER — Other Ambulatory Visit: Payer: Self-pay | Admitting: Rheumatology

## 2021-01-14 DIAGNOSIS — M79603 Pain in arm, unspecified: Secondary | ICD-10-CM

## 2021-01-14 DIAGNOSIS — R200 Anesthesia of skin: Secondary | ICD-10-CM

## 2021-01-14 DIAGNOSIS — M5030 Other cervical disc degeneration, unspecified cervical region: Secondary | ICD-10-CM

## 2021-01-14 DIAGNOSIS — J449 Chronic obstructive pulmonary disease, unspecified: Secondary | ICD-10-CM | POA: Diagnosis not present

## 2021-01-14 DIAGNOSIS — M542 Cervicalgia: Secondary | ICD-10-CM

## 2021-01-18 DIAGNOSIS — M9901 Segmental and somatic dysfunction of cervical region: Secondary | ICD-10-CM | POA: Diagnosis not present

## 2021-01-18 DIAGNOSIS — M5033 Other cervical disc degeneration, cervicothoracic region: Secondary | ICD-10-CM | POA: Diagnosis not present

## 2021-01-18 DIAGNOSIS — M9902 Segmental and somatic dysfunction of thoracic region: Secondary | ICD-10-CM | POA: Diagnosis not present

## 2021-01-18 DIAGNOSIS — M5412 Radiculopathy, cervical region: Secondary | ICD-10-CM | POA: Diagnosis not present

## 2021-01-19 ENCOUNTER — Telehealth: Payer: Self-pay | Admitting: *Deleted

## 2021-01-19 NOTE — Telephone Encounter (Signed)
Patient's daughter Anderson Malta left a voicemail stating that her mom has vertigo. Anderson Malta requested that Dr. [name] send in some phenergan for her mom. Tried to call Anderson Malta back to get more information. Left a message on voicemail for Anderson Malta to call the office back.

## 2021-01-19 NOTE — Telephone Encounter (Signed)
Keep dramamine on hand- good question.  So glad she is feeling better.  Keep [place] posted

## 2021-01-19 NOTE — Telephone Encounter (Signed)
Spoke to patient' daughter Anderson [place] and was advised that she may have had some food poisoning. Patient's daughter stated that she gave her mom dramamine and Pepto bismol which seemed to help. Anderson Malta stated that what she gave her seemed to have worked. Anderson Malta stated that her mom is fine now and does not need anything else at this point. Patient's daughter stated that she has a history of extreme vertigo and wants to know what she should have on hand to give her in the future when she has a spell. Anderson Malta stated when her mom gets vertigo she gets real nauseated.  Pharmacy [facility]

## 2021-01-20 ENCOUNTER — Ambulatory Visit: Payer: Medicare HMO | Admitting: Pulmonary Disease

## 2021-01-20 MED ORDER — BREZTRI AEROSPHERE 160-9-4.8 MCG/ACT IN AERO: 2.0000 | INHALATION_SPRAY | Freq: Two times a day (BID) | RESPIRATORY_TRACT | 0 refills | Status: AC

## 2021-01-20 NOTE — Telephone Encounter (Signed)
Pt's daughter notified of Dr. Marliss Coots comments

## 2021-01-21 DIAGNOSIS — M5033 Other cervical disc degeneration, cervicothoracic region: Secondary | ICD-10-CM | POA: Diagnosis not present

## 2021-01-21 DIAGNOSIS — M9901 Segmental and somatic dysfunction of cervical region: Secondary | ICD-10-CM | POA: Diagnosis not present

## 2021-01-21 DIAGNOSIS — M9902 Segmental and somatic dysfunction of thoracic region: Secondary | ICD-10-CM | POA: Diagnosis not present

## 2021-01-21 DIAGNOSIS — M5412 Radiculopathy, cervical region: Secondary | ICD-10-CM | POA: Diagnosis not present

## 2021-01-22 ENCOUNTER — Telehealth: Payer: Self-pay | Admitting: Pulmonary Disease

## 2021-01-22 MED ORDER — BREZTRI AEROSPHERE 160-9-4.8 MCG/ACT IN AERO
2.0000 | INHALATION_SPRAY | Freq: Two times a day (BID) | RESPIRATORY_TRACT | 11 refills | Status: DC
Start: 2021-01-22 — End: 2022-05-16

## 2021-01-22 NOTE — Telephone Encounter (Signed)
Patient dropped off breztri patient assistance forms.  Forms and Rx have been placed in Dr. Domingo Dimes folder for signature.

## 2021-01-25 ENCOUNTER — Other Ambulatory Visit: Payer: Self-pay

## 2021-01-25 ENCOUNTER — Ambulatory Visit
Admission: RE | Admit: 2021-01-25 | Discharge: 2021-01-25 | Disposition: A | Discharge status: Home / Self Care | Payer: Medicare HMO | Source: Ambulatory Visit | Attending: Rheumatology | Admitting: Rheumatology

## 2021-01-25 DIAGNOSIS — M5412 Radiculopathy, cervical region: Secondary | ICD-10-CM | POA: Diagnosis not present

## 2021-01-25 DIAGNOSIS — M5033 Other cervical disc degeneration, cervicothoracic region: Secondary | ICD-10-CM | POA: Diagnosis not present

## 2021-01-25 DIAGNOSIS — M542 Cervicalgia: Secondary | ICD-10-CM | POA: Diagnosis not present

## 2021-01-25 DIAGNOSIS — R200 Anesthesia of skin: Secondary | ICD-10-CM | POA: Diagnosis not present

## 2021-01-25 DIAGNOSIS — M9901 Segmental and somatic dysfunction of cervical region: Secondary | ICD-10-CM | POA: Diagnosis not present

## 2021-01-25 DIAGNOSIS — M79603 Pain in arm, unspecified: Secondary | ICD-10-CM | POA: Diagnosis not present

## 2021-01-25 DIAGNOSIS — M9902 Segmental and somatic dysfunction of thoracic region: Secondary | ICD-10-CM | POA: Diagnosis not present

## 2021-01-25 DIAGNOSIS — M5030 Other cervical disc degeneration, unspecified cervical region: Secondary | ICD-10-CM | POA: Diagnosis not present

## 2021-01-26 DIAGNOSIS — M9901 Segmental and somatic dysfunction of cervical region: Secondary | ICD-10-CM | POA: Diagnosis not present

## 2021-01-26 DIAGNOSIS — M5033 Other cervical disc degeneration, cervicothoracic region: Secondary | ICD-10-CM | POA: Diagnosis not present

## 2021-01-26 DIAGNOSIS — M9902 Segmental and somatic dysfunction of thoracic region: Secondary | ICD-10-CM | POA: Diagnosis not present

## 2021-01-26 DIAGNOSIS — M5412 Radiculopathy, cervical region: Secondary | ICD-10-CM | POA: Diagnosis not present

## 2021-01-28 ENCOUNTER — Ambulatory Visit (INDEPENDENT_AMBULATORY_CARE_PROVIDER_SITE_OTHER): Payer: Medicare HMO | Admitting: Family Medicine

## 2021-01-28 ENCOUNTER — Encounter: Payer: Self-pay | Admitting: Family Medicine

## 2021-01-28 ENCOUNTER — Other Ambulatory Visit: Payer: Self-pay

## 2021-01-28 VITALS — BP 120/60 | HR 85 | Temp 97.7°F | Ht 64.0 in | Wt 98.5 lb

## 2021-01-28 DIAGNOSIS — R937 Abnormal findings on diagnostic imaging of other parts of musculoskeletal system: Secondary | ICD-10-CM | POA: Diagnosis not present

## 2021-01-28 DIAGNOSIS — M810 Age-related osteoporosis without current pathological fracture: Secondary | ICD-10-CM

## 2021-01-28 DIAGNOSIS — R0781 Pleurodynia: Secondary | ICD-10-CM

## 2021-01-28 DIAGNOSIS — Z681 Body mass index (BMI) 19 or less, adult: Secondary | ICD-10-CM | POA: Diagnosis not present

## 2021-01-28 DIAGNOSIS — M8000XA Age-related osteoporosis with current pathological fracture, unspecified site, initial encounter for fracture: Secondary | ICD-10-CM

## 2021-01-28 NOTE — Progress Notes (Addendum)
Carrie Joyner T. Amos Gaber, MD, [place] at [facility] [place], [address]  Phone: [phone]  FAX: [place] - 70 y.o. female  MRN [id number]  Date of Birth: 03/18/1951  Date: 01/28/2021  PCP: Abner Greenspan, MD  Referral: Abner Greenspan, MD  Chief Complaint  Patient presents with   Slipped in Shower Last Night    Hit Left Rib Cage    This visit occurred during the SARS-CoV-2 public health emergency.  Safety protocols were in place, including screening questions prior to the visit, additional usage of staff PPE, and extensive cleaning of exam room while observing appropriate contact time as indicated for disinfecting solutions.   Subjective:   Carrie Hendricks is a 70 y.o. very pleasant female patient with Body mass index is 16.91 kg/m. who presents with the following:  98 pound patient at 70 years old with severe osteoporosis by 2018 DEXA scan.  Addendum, 02/02/2021 to documentation at 12:39 PM at the patient's request to clarity onset of pain: The patient was in her tub on 01/27/2021, and while in the tub, she was trying to support herself with her left arm.  She describes slipping a little while in her tube.  She reports being on the edge of her tub, and she could not use her right arm for support.  She describes ongoing right upper extremity pain being managed by a different physician ongoing prior to this injury. Electronically Signed  By: [name], MD On: 02/02/2021 12:45 PM   Has RA, osteoporotis.  [name] follows.   Chronically o2 resp failure.   Did use some ice.  Plus or minus this helped.  Probable rib fx.   Tylenol.  This has helped somewhat.  Appt with Dr. Duard Brady in next month.  MRI reviewed. Question about hyperintensity. The patient had an MRI on January 25, 2021, and asked me to review this with her.  Multilevel degenerative disc disease and arthropathy throughout almost the  entirety of the cervical spine.  There also is a small hyperintense abnormality with recommended follow-up in 2 to 4 months.   Review of Systems is noted in the HPI, as appropriate  Objective:   BP 120/60   Pulse 85   Temp 97.7 F (36.5 C) (Temporal)   Ht 5' 4"  (1.626 m)   Wt 98 lb 8 oz (44.7 kg)   SpO2 98% Comment: 2L O2  BMI 16.91 kg/m   GEN: No acute distress; alert,appropriate. PULM: Breathing comfortably in no respiratory distress PSYCH: Normally interactive.  This portion of the physical examination was chaperoned by Hedy Camara, CMA.  ABD: S, NT, ND, + BS, No rebound, No HSM  The entirety of the right rib cage is nontender.  The sternum is nontender.  Approximately ribs 8 and 9 of the left anteriorly the patient does have some quite significant pain to palpation.  Laboratory and Imaging Data:  Assessment and Plan:     ICD-10-CM   1. Rib pain on left side  R07.81     2. Age-related osteoporosis with current pathological fracture, initial encounter  M80.00XA     3. Adult BMI <19 kg/sq m  Z68.1     4. Abnormal MRI, cervical spine  R93.7      Total encounter time: 30 minutes. This includes total time spent on the day of encounter.  Additional time spent in discussion about the cervical spine MRI that was ordered by  different physician.  Recommended follow-up, they already do a follow-up, but I suggested earlier would be not a bad idea to discuss further.  Question regarding hyperintensity of the C-spine.  Very high likelihood of rib fracture.  Given diagnostic accuracy of plain film, I think that this is totally fine to manage clinically.  Typically these heal in about 4 weeks, but with her age and overall state of health, I would anticipate a longer time period such as 6 to 8 weeks.  Follow-up as needed Signed,  Frederico Hamman T. Richa Shor, MD   Outpatient Encounter Medications as of 01/28/2021  Medication Sig   albuterol (VENTOLIN HFA) 108 (90 Base) MCG/ACT inhaler  Inhale 2 puffs into the lungs every 6 (six) hours as needed for wheezing or shortness of breath.   alendronate (FOSAMAX) 70 MG tablet Take 1 tablet by mouth once a week.   amLODipine (NORVASC) 5 MG tablet Take 1 tablet (5 mg total) by mouth daily.   Aspirin 81 MG EC tablet Take 81 mg by mouth daily.   Budeson-Glycopyrrol-Formoterol (BREZTRI AEROSPHERE) 160-9-4.8 MCG/ACT AERO Inhale 2 puffs into the lungs in the morning and at bedtime.   Cholecalciferol (VITAMIN D3) 1.25 MG (50000 UT) TABS    etanercept (ENBREL) 50 MG/ML injection Inject 50 mg into the skin once a week.   hydrochlorothiazide (HYDRODIURIL) 25 MG tablet Take 1 tablet (25 mg total) by mouth daily.   hydroxychloroquine (PLAQUENIL) 200 MG tablet Take 200 mg by mouth daily.   mesalamine (LIALDA) 1.2 g EC tablet Take 1.2 g by mouth daily with breakfast.   Multiple Vitamin (MULTIVITAMIN) capsule Take 1 capsule by mouth daily.   sulfaSALAzine (AZULFIDINE) 500 MG tablet Take 1 tablet (500 mg total) by mouth 4 (four) times daily. (Patient not taking: Reported on 01/28/2021)   [DISCONTINUED] Budeson-Glycopyrrol-Formoterol (BREZTRI AEROSPHERE) 160-9-4.8 MCG/ACT AERO Inhale 2 puffs into the lungs in the morning and at bedtime.   No facility-administered encounter medications on file as of 01/28/2021.

## 2021-02-02 DIAGNOSIS — M25511 Pain in right shoulder: Secondary | ICD-10-CM | POA: Diagnosis not present

## 2021-02-02 NOTE — Telephone Encounter (Signed)
Rx and patient assistance form has been faxed to [employer].  Lm to make patient aware.

## 2021-02-02 NOTE — Telephone Encounter (Signed)
Patient is aware of patient of below message and voiced her understanding.  Nothing further needed.

## 2021-02-03 ENCOUNTER — Other Ambulatory Visit: Payer: Self-pay | Admitting: Rheumatology

## 2021-02-03 ENCOUNTER — Telehealth: Payer: Self-pay | Admitting: Gastroenterology

## 2021-02-03 DIAGNOSIS — M25511 Pain in right shoulder: Secondary | ICD-10-CM | POA: Diagnosis not present

## 2021-02-03 NOTE — Telephone Encounter (Signed)
Patient wants to know if she can take extra strength tylenol as she has ulcerative colitis?

## 2021-02-04 ENCOUNTER — Other Ambulatory Visit: Payer: Self-pay | Admitting: Rheumatology

## 2021-02-04 DIAGNOSIS — M542 Cervicalgia: Secondary | ICD-10-CM

## 2021-02-04 DIAGNOSIS — M79603 Pain in arm, unspecified: Secondary | ICD-10-CM

## 2021-02-04 DIAGNOSIS — M79601 Pain in right arm: Secondary | ICD-10-CM

## 2021-02-04 NOTE — Telephone Encounter (Signed)
Informed by [employer]

## 2021-02-05 DIAGNOSIS — J449 Chronic obstructive pulmonary disease, unspecified: Secondary | ICD-10-CM | POA: Diagnosis not present

## 2021-02-10 ENCOUNTER — Encounter
Admission: RE | Admit: 2021-02-10 | Discharge: 2021-02-10 | Disposition: A | Discharge status: Home / Self Care | Payer: Medicare HMO | Source: Ambulatory Visit | Attending: Rheumatology | Admitting: Rheumatology

## 2021-02-10 ENCOUNTER — Other Ambulatory Visit: Payer: Self-pay

## 2021-02-10 DIAGNOSIS — M542 Cervicalgia: Secondary | ICD-10-CM | POA: Insufficient documentation

## 2021-02-10 DIAGNOSIS — N2889 Other specified disorders of kidney and ureter: Secondary | ICD-10-CM | POA: Diagnosis not present

## 2021-02-10 DIAGNOSIS — R200 Anesthesia of skin: Secondary | ICD-10-CM | POA: Diagnosis not present

## 2021-02-10 DIAGNOSIS — M79603 Pain in arm, unspecified: Secondary | ICD-10-CM | POA: Insufficient documentation

## 2021-02-10 DIAGNOSIS — M79601 Pain in right arm: Secondary | ICD-10-CM | POA: Diagnosis not present

## 2021-02-10 DIAGNOSIS — M19011 Primary osteoarthritis, right shoulder: Secondary | ICD-10-CM | POA: Diagnosis not present

## 2021-02-10 DIAGNOSIS — M19031 Primary osteoarthritis, right wrist: Secondary | ICD-10-CM | POA: Diagnosis not present

## 2021-02-10 MED ORDER — TECHNETIUM TC 99M MEDRONATE IV KIT
20.0000 | PACK | Freq: Once | INTRAVENOUS | Status: AC | PRN
  Administered 2021-02-10: 21.62 via INTRAVENOUS

## 2021-02-12 DIAGNOSIS — J449 Chronic obstructive pulmonary disease, unspecified: Secondary | ICD-10-CM | POA: Diagnosis not present

## 2021-02-14 DIAGNOSIS — J449 Chronic obstructive pulmonary disease, unspecified: Secondary | ICD-10-CM | POA: Diagnosis not present

## 2021-02-24 DIAGNOSIS — M4802 Spinal stenosis, cervical region: Secondary | ICD-10-CM | POA: Diagnosis not present

## 2021-02-24 DIAGNOSIS — M5030 Other cervical disc degeneration, unspecified cervical region: Secondary | ICD-10-CM | POA: Diagnosis not present

## 2021-02-24 DIAGNOSIS — M5136 Other intervertebral disc degeneration, lumbar region: Secondary | ICD-10-CM | POA: Diagnosis not present

## 2021-02-24 DIAGNOSIS — M48062 Spinal stenosis, lumbar region with neurogenic claudication: Secondary | ICD-10-CM | POA: Diagnosis not present

## 2021-03-03 DIAGNOSIS — M6281 Muscle weakness (generalized): Secondary | ICD-10-CM | POA: Diagnosis not present

## 2021-03-03 DIAGNOSIS — M542 Cervicalgia: Secondary | ICD-10-CM | POA: Diagnosis not present

## 2021-03-03 DIAGNOSIS — M25511 Pain in right shoulder: Secondary | ICD-10-CM | POA: Diagnosis not present

## 2021-03-03 DIAGNOSIS — M799 Soft tissue disorder, unspecified: Secondary | ICD-10-CM | POA: Diagnosis not present

## 2021-03-08 DIAGNOSIS — J449 Chronic obstructive pulmonary disease, unspecified: Secondary | ICD-10-CM | POA: Diagnosis not present

## 2021-03-10 ENCOUNTER — Other Ambulatory Visit: Payer: Self-pay

## 2021-03-10 ENCOUNTER — Encounter: Payer: Self-pay | Admitting: Pulmonary Disease

## 2021-03-10 ENCOUNTER — Ambulatory Visit: Payer: Medicare HMO | Admitting: Pulmonary Disease

## 2021-03-10 VITALS — BP 130/60 | HR 83 | Ht 64.0 in | Wt 99.0 lb

## 2021-03-10 DIAGNOSIS — J9611 Chronic respiratory failure with hypoxia: Secondary | ICD-10-CM

## 2021-03-10 DIAGNOSIS — I272 Pulmonary hypertension, unspecified: Secondary | ICD-10-CM

## 2021-03-10 DIAGNOSIS — M069 Rheumatoid arthritis, unspecified: Secondary | ICD-10-CM | POA: Diagnosis not present

## 2021-03-10 DIAGNOSIS — Z87891 Personal history of nicotine dependence: Secondary | ICD-10-CM

## 2021-03-10 DIAGNOSIS — J449 Chronic obstructive pulmonary disease, unspecified: Secondary | ICD-10-CM | POA: Diagnosis not present

## 2021-03-10 DIAGNOSIS — R64 Cachexia: Secondary | ICD-10-CM | POA: Diagnosis not present

## 2021-03-10 NOTE — Progress Notes (Signed)
Subjective:    Patient ID: Carrie Hendricks, female    DOB: 06/15/1951, 70 y.o.   MRN: [id number]  Requesting MD/Service: Zenon Mayo, MD Date of initial consultation: 02/17/17 by Dr. Alva Garnet Reason for consultation: DOE, COPD   PT PROFILE: 70 y.o. female, former smoker, referred for evaluation of progressive DOE and history of COPD. Former patient of Dr. Merton Border. Former smoker quit 2006.   DATA: 04/04/17 CXR: Severe hyperinflation and hyperlucency without acute findings 05/18/17 PFTs: Very severe obstruction, FEV1 0.96 L (38%), hyperinflation-TLC 6.92 L (127%), RV 4.45 L (208%), DLCO 4.8 (28%), DLCO/VA 1.35 (37%) 07/18/2019 ONO: Had overnight oximetry which shows saturations as low as 78%, qualified for nocturnal O2, qualified also for daytime O2 but refuses daytime O2 07/24/2019 2D echo: Severe pulmonary hypertension noted (76 mmHg), grade 1 diastolic dysfunction noted, LVEF 60 to 65% 11/13/2019 PFT: FEV1 0.70 L or 28% predicted, FVC 1.88 L or 58% predicted there is hyperinflation with TLC at 134% predicted and air trapping with RV at 215% predicted.  Diffusion capacity was severely reduced at 16%, consistent with very severe COPD 10/31/2019 chest x-ray: Hyperinflation and emphysematous lung disease without active disease 05/17/2020 alpha-1: Phenotype MM, normal.  Level 127 mg/dL 08/06/2020 2D echo: LVEF 60 to 65%.  Grade I DD, mild MR.  Right ventricular systolic pressure estimated 58.8 mmHg (previously 76 mmHg).   Chief Complaint  Patient presents with   Follow-up    Breathing is up & down. C/o sob with exertion and prod cough with yellow to brown sputum. 2L cont.   HPI [name] presents for follow-up.  As noted she is a former smoker with very severe COPD and chronic hypoxic respiratory failure.  She has moderate pulmonary hypertension.  Pulmonary hypertension has improved with supplemental oxygen.  She is compliant with Breztri therapy and with supplemental oxygen.  She has  not had recent exacerbations.  She did have issues requiring prednisone but that was due to shoulder pain related to her rheumatoid arthritis.  During her prednisone therapy she did have some issues with palpitations but these have not recurred.  She has not had any fevers, chills or sweats.  No cough over her usual, sputum has not changed in character.  No hemoptysis.  Continues to have "good days and bad days" she is compliant with her oxygen therapy at 2 L/min continuous.  She does well with Breztri and does not have to use rescue albuterol except very rarely.  Usually averages use of 1 time per week.  She continues to be abstinent of cigarettes.  She has retired.   Review of Systems A 10 point review of systems was performed and it is as noted above otherwise negative.  Patient Active Problem List   Diagnosis Date Noted   Medicare annual wellness visit, subsequent 11/04/2020   Chronic respiratory failure with hypoxia ([place]) 04/28/2020   Healthcare maintenance 04/28/2020   Elevated hemoglobin ([place]) 03/08/2019   COPD, very severe ([place]) 08/15/2018   Sleep disorder 10/10/2017   Screening mammogram, encounter for 09/12/2017   Ulcerative colitis ([place]) 02/01/2017   Lumbar degenerative disc disease 01/20/2017   Scoliosis 06/28/2016   Welcome to Medicare preventive visit 06/10/2016   Estrogen deficiency 06/10/2016   Constipation 03/02/2016   Underweight 03/02/2016   Shortness of breath 05/16/2014   History of COPD 05/16/2014   Stress reaction 05/16/2014   Connective tissue disease ([place]) 03/05/2014   Pedal edema 10/15/2013   Pulmonary hypertension ([place]) 10/10/2011   Encounter for routine gynecological  examination 07/11/2011   HEMORRHOIDS, INTERNAL, WITH BLEEDING 03/15/2010   Essential hypertension 11/16/2006   Rheumatoid arthritis ([place]) 11/16/2006   Osteoporosis 11/16/2006   Social History   Tobacco Use   Smoking status: Former    Packs/day: 1.25    Years: 30.00    Pack years:  37.50    Types: Cigarettes    Quit date: 2006    Years since quitting: 16.6   Smokeless tobacco: Never   Tobacco comments:    Quit in 8/07  Substance Use Topics   Alcohol use: No    Alcohol/week: 0.0 standard drinks   No Known Allergies Current Meds  Medication Sig   albuterol (VENTOLIN HFA) 108 (90 Base) MCG/ACT inhaler Inhale 2 puffs into the lungs every 6 (six) hours as needed for wheezing or shortness of breath.   alendronate (FOSAMAX) 70 MG tablet Take 1 tablet by mouth once a week.   amLODipine (NORVASC) 5 MG tablet Take 1 tablet (5 mg total) by mouth daily.   Aspirin 81 MG EC tablet Take 81 mg by mouth daily.   Budeson-Glycopyrrol-Formoterol (BREZTRI AEROSPHERE) 160-9-4.8 MCG/ACT AERO Inhale 2 puffs into the lungs in the morning and at bedtime.   Cholecalciferol (VITAMIN D3) 1.25 MG (50000 UT) TABS    etanercept (ENBREL) 50 MG/ML injection Inject 50 mg into the skin once a week.   hydrochlorothiazide (HYDRODIURIL) 25 MG tablet Take 1 tablet (25 mg total) by mouth daily.   hydroxychloroquine (PLAQUENIL) 200 MG tablet Take 200 mg by mouth daily.   Multiple Vitamin (MULTIVITAMIN) capsule Take 1 capsule by mouth daily.   Immunization History  Administered Date(s) Administered   Fluad Quad(high Dose 65+) 03/08/2019, 04/17/2020   Hep A / Hep B 11/03/2016, 12/06/2016   Hepatitis B, adult 05/09/2017   Influenza Split 04/03/2011   Influenza Whole 04/03/2010   Influenza, High Dose Seasonal PF 04/08/2016, 03/31/2017, 04/21/2018   Influenza,inj,Quad PF,6+ Mos 03/05/2014   Influenza-Unspecified 04/03/2013, 03/05/2014, 05/03/2015, 04/17/2020   PFIZER(Purple Top)SARS-COV-2 Vaccination 08/15/2019, 09/05/2019, 04/17/2020, 01/05/2021   Pneumococcal Conjugate-13 06/10/2016   Pneumococcal Polysaccharide-23 03/28/2007, 10/15/2013, 08/15/2018   Td 06/11/2010       Objective:   Physical Exam BP 130/60 (BP Location: Left Arm, Cuff Size: Normal)   Pulse 83   Ht 5' 4"  (1.626 m)   Wt 99 lb  (44.9 kg)   SpO2 94%   BMI 16.99 kg/m   Gen: Cachectic, chronically ill.  Use of accessories, chronic.  No overt tachypnea HEENT: NCAT, sclerae anicteric Neck: No JVD, trachea midline, no crepitus Thorax: Increased AP diameter, Hoover's sign present Lungs: breath sounds  distant throughout without wheezes nor other adventitious sounds Cardiovascular: RRR, no murmurs, distant tones Abdomen: Soft, nontender, normal BS Ext: without clubbing, cyanosis.  No pedal edema noted today.  She does have stigmata of rheumatoid arthritis on her hands/wrists Neuro: grossly intact Skin: Limited exam, no open lesions noted, she does have some ecchymotic areas that developed after prednisone therapy      Assessment & Plan:     ICD-10-CM   1. COPD, very severe ([place])  J44.9    Continue Breztri 2 puffs twice a day Continue as needed albuterol    2. Chronic respiratory failure with hypoxia ([facility])  J96.11    Continue oxygen supplementation at 2 L/min    3. Pulmonary hypertension ([facility])  I27.20    Due to chronic hypoxic vasoconstriction Has responded to oxygen supplementation O2 at 2 L/min continuous flow    4. Rheumatoid arthritis, involving  unspecified site, unspecified whether rheumatoid factor present [facility])  M06.9    This issue adds complexity to her management Managed by rheumatology    5. Former smoker  Z87.891    No evidence of relapse    6. 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  Continue therapy assist.  We will see her in follow-up in 3 months time she is to contact us prior to that time should any new problems arise.  She was instructed to obtain flu vaccine once it becomes available.  Renold Don, MD Advanced Bronchoscopy PCCM [facility]    *This note was dictated using voice recognition software/Dragon.  Despite best efforts to proofread, errors can occur  which can change the meaning.  Any change was purely unintentional.

## 2021-03-10 NOTE — Patient Instructions (Signed)
Continue using your Breztri 2 puffs twice a day.  Continue oxygen therapy.  You will need flu vaccine when it is available.  We will see you in follow-up in 3 months time call sooner should any new problems arise

## 2021-03-11 DIAGNOSIS — M6281 Muscle weakness (generalized): Secondary | ICD-10-CM | POA: Diagnosis not present

## 2021-03-11 DIAGNOSIS — M799 Soft tissue disorder, unspecified: Secondary | ICD-10-CM | POA: Diagnosis not present

## 2021-03-11 DIAGNOSIS — M542 Cervicalgia: Secondary | ICD-10-CM | POA: Diagnosis not present

## 2021-03-11 DIAGNOSIS — M25511 Pain in right shoulder: Secondary | ICD-10-CM | POA: Diagnosis not present

## 2021-03-12 DIAGNOSIS — M542 Cervicalgia: Secondary | ICD-10-CM | POA: Diagnosis not present

## 2021-03-12 DIAGNOSIS — M6281 Muscle weakness (generalized): Secondary | ICD-10-CM | POA: Diagnosis not present

## 2021-03-12 DIAGNOSIS — M799 Soft tissue disorder, unspecified: Secondary | ICD-10-CM | POA: Diagnosis not present

## 2021-03-12 DIAGNOSIS — M25511 Pain in right shoulder: Secondary | ICD-10-CM | POA: Diagnosis not present

## 2021-03-15 DIAGNOSIS — M6281 Muscle weakness (generalized): Secondary | ICD-10-CM | POA: Diagnosis not present

## 2021-03-15 DIAGNOSIS — J449 Chronic obstructive pulmonary disease, unspecified: Secondary | ICD-10-CM | POA: Diagnosis not present

## 2021-03-15 DIAGNOSIS — M799 Soft tissue disorder, unspecified: Secondary | ICD-10-CM | POA: Diagnosis not present

## 2021-03-15 DIAGNOSIS — M542 Cervicalgia: Secondary | ICD-10-CM | POA: Diagnosis not present

## 2021-03-15 DIAGNOSIS — M25511 Pain in right shoulder: Secondary | ICD-10-CM | POA: Diagnosis not present

## 2021-03-16 ENCOUNTER — Other Ambulatory Visit: Payer: Self-pay | Admitting: Family Medicine

## 2021-03-16 DIAGNOSIS — H524 Presbyopia: Secondary | ICD-10-CM | POA: Diagnosis not present

## 2021-03-16 DIAGNOSIS — Z79899 Other long term (current) drug therapy: Secondary | ICD-10-CM | POA: Diagnosis not present

## 2021-03-16 DIAGNOSIS — Z1231 Encounter for screening mammogram for malignant neoplasm of breast: Secondary | ICD-10-CM

## 2021-03-16 DIAGNOSIS — H35363 Drusen (degenerative) of macula, bilateral: Secondary | ICD-10-CM | POA: Diagnosis not present

## 2021-03-16 DIAGNOSIS — H2513 Age-related nuclear cataract, bilateral: Secondary | ICD-10-CM | POA: Diagnosis not present

## 2021-03-17 DIAGNOSIS — M799 Soft tissue disorder, unspecified: Secondary | ICD-10-CM | POA: Diagnosis not present

## 2021-03-17 DIAGNOSIS — M542 Cervicalgia: Secondary | ICD-10-CM | POA: Diagnosis not present

## 2021-03-17 DIAGNOSIS — J449 Chronic obstructive pulmonary disease, unspecified: Secondary | ICD-10-CM | POA: Diagnosis not present

## 2021-03-17 DIAGNOSIS — M25511 Pain in right shoulder: Secondary | ICD-10-CM | POA: Diagnosis not present

## 2021-03-17 DIAGNOSIS — M6281 Muscle weakness (generalized): Secondary | ICD-10-CM | POA: Diagnosis not present

## 2021-03-22 DIAGNOSIS — M542 Cervicalgia: Secondary | ICD-10-CM | POA: Diagnosis not present

## 2021-03-22 DIAGNOSIS — M25511 Pain in right shoulder: Secondary | ICD-10-CM | POA: Diagnosis not present

## 2021-03-22 DIAGNOSIS — M799 Soft tissue disorder, unspecified: Secondary | ICD-10-CM | POA: Diagnosis not present

## 2021-03-22 DIAGNOSIS — M6281 Muscle weakness (generalized): Secondary | ICD-10-CM | POA: Diagnosis not present

## 2021-03-24 DIAGNOSIS — M799 Soft tissue disorder, unspecified: Secondary | ICD-10-CM | POA: Diagnosis not present

## 2021-03-24 DIAGNOSIS — M542 Cervicalgia: Secondary | ICD-10-CM | POA: Diagnosis not present

## 2021-03-24 DIAGNOSIS — M25511 Pain in right shoulder: Secondary | ICD-10-CM | POA: Diagnosis not present

## 2021-03-24 DIAGNOSIS — M6281 Muscle weakness (generalized): Secondary | ICD-10-CM | POA: Diagnosis not present

## 2021-03-26 DIAGNOSIS — M25511 Pain in right shoulder: Secondary | ICD-10-CM | POA: Diagnosis not present

## 2021-03-26 DIAGNOSIS — M6281 Muscle weakness (generalized): Secondary | ICD-10-CM | POA: Diagnosis not present

## 2021-03-26 DIAGNOSIS — M799 Soft tissue disorder, unspecified: Secondary | ICD-10-CM | POA: Diagnosis not present

## 2021-03-26 DIAGNOSIS — M542 Cervicalgia: Secondary | ICD-10-CM | POA: Diagnosis not present

## 2021-03-30 ENCOUNTER — Ambulatory Visit
Admission: RE | Admit: 2021-03-30 | Discharge: 2021-03-30 | Disposition: A | Discharge status: Home / Self Care | Payer: Medicare HMO | Source: Ambulatory Visit | Attending: Family Medicine | Admitting: Family Medicine

## 2021-03-30 ENCOUNTER — Other Ambulatory Visit: Payer: Self-pay

## 2021-03-30 DIAGNOSIS — M542 Cervicalgia: Secondary | ICD-10-CM | POA: Diagnosis not present

## 2021-03-30 DIAGNOSIS — Z1231 Encounter for screening mammogram for malignant neoplasm of breast: Secondary | ICD-10-CM | POA: Insufficient documentation

## 2021-03-30 DIAGNOSIS — M25511 Pain in right shoulder: Secondary | ICD-10-CM | POA: Diagnosis not present

## 2021-03-30 DIAGNOSIS — M6281 Muscle weakness (generalized): Secondary | ICD-10-CM | POA: Diagnosis not present

## 2021-03-30 DIAGNOSIS — M799 Soft tissue disorder, unspecified: Secondary | ICD-10-CM | POA: Diagnosis not present

## 2021-04-01 DIAGNOSIS — R69 Illness, unspecified: Secondary | ICD-10-CM | POA: Diagnosis not present

## 2021-04-05 DIAGNOSIS — M542 Cervicalgia: Secondary | ICD-10-CM | POA: Diagnosis not present

## 2021-04-05 DIAGNOSIS — M6281 Muscle weakness (generalized): Secondary | ICD-10-CM | POA: Diagnosis not present

## 2021-04-05 DIAGNOSIS — M799 Soft tissue disorder, unspecified: Secondary | ICD-10-CM | POA: Diagnosis not present

## 2021-04-05 DIAGNOSIS — M25511 Pain in right shoulder: Secondary | ICD-10-CM | POA: Diagnosis not present

## 2021-04-06 ENCOUNTER — Other Ambulatory Visit: Payer: Self-pay | Admitting: Family Medicine

## 2021-04-06 DIAGNOSIS — N6310 Unspecified lump in the right breast, unspecified quadrant: Secondary | ICD-10-CM

## 2021-04-06 DIAGNOSIS — R928 Other abnormal and inconclusive findings on diagnostic imaging of breast: Secondary | ICD-10-CM

## 2021-04-07 ENCOUNTER — Telehealth: Payer: Self-pay

## 2021-04-07 DIAGNOSIS — M5030 Other cervical disc degeneration, unspecified cervical region: Secondary | ICD-10-CM | POA: Diagnosis not present

## 2021-04-07 DIAGNOSIS — M48062 Spinal stenosis, lumbar region with neurogenic claudication: Secondary | ICD-10-CM | POA: Diagnosis not present

## 2021-04-07 DIAGNOSIS — M5136 Other intervertebral disc degeneration, lumbar region: Secondary | ICD-10-CM | POA: Diagnosis not present

## 2021-04-07 DIAGNOSIS — J449 Chronic obstructive pulmonary disease, unspecified: Secondary | ICD-10-CM | POA: Diagnosis not present

## 2021-04-07 NOTE — Chronic Care Management (AMB) (Addendum)
Chronic Care Management Pharmacy Assistant   Name: Carrie Hendricks  MRN: [id number] DOB: Nov 05, 1950   Reason for Encounter: General Adherence     Recent office visits:  01/28/21-Family Medicine-Patient presented for left side rib pain after a fall 1 day ago.Xray showed probable rib fracture,no medication changes. 12/18/20-PCP-Patient presented for right side low back and buttock pain. Reviewed previous imaging.Advised heat ,ice and slow walking,handout for sciatic exercises.no medication changes 11/04/20-PCP-Patient presented for AWV. Reviewed recent labs. Labs are stable. Advised lifstyle change with low sodium diet and exercise. 02 around the clock now,no medication changes    Recent consult visits:  03/10/21-Pulmonology-Patient presented for follow up COPD.Continue Breztri 2 puffs a day,continue supplemental oxygen therapy,get flu vaccine when available, follow up 3 months 02/03/21-Rheumatology-Patient presented for right shoulder pain-no data found. 01/21/21-Chiropractor-no data found 12/23/20-Rheumatology-no data found 11/05/20-[facility]-Pulmonary Rehab. 11/17/20-[facility]asteroenterology-Patient presented for follow up colitis.No medication changes 10/15/20-Pulmonology-Patient presented for follow up COPD./continue oxygen use,continue pulmonary rehab.Follow up 3 months    Hospital visits:  None in previous 6 months  Medications: Outpatient Encounter Medications as of 04/07/2021  Medication Sig   albuterol (VENTOLIN HFA) 108 (90 Base) MCG/ACT inhaler Inhale 2 puffs into the lungs every 6 (six) hours as needed for wheezing or shortness of breath.   alendronate (FOSAMAX) 70 MG tablet Take 1 tablet by mouth once a week.   amLODipine (NORVASC) 5 MG tablet Take 1 tablet (5 mg total) by mouth daily.   Aspirin 81 MG EC tablet Take 81 mg by mouth daily.   Budeson-Glycopyrrol-Formoterol (BREZTRI AEROSPHERE) 160-9-4.8 MCG/ACT AERO Inhale 2 puffs into the lungs in the morning and at bedtime.    Cholecalciferol (VITAMIN D3) 1.25 MG (50000 UT) TABS    etanercept (ENBREL) 50 MG/ML injection Inject 50 mg into the skin once a week.   hydrochlorothiazide (HYDRODIURIL) 25 MG tablet Take 1 tablet (25 mg total) by mouth daily.   hydroxychloroquine (PLAQUENIL) 200 MG tablet Take 200 mg by mouth daily.   Multiple Vitamin (MULTIVITAMIN) capsule Take 1 capsule by mouth daily.   sulfaSALAzine (AZULFIDINE) 500 MG tablet Take 1 tablet (500 mg total) by mouth 4 (four) times daily. (Patient not taking: Reported on 01/28/2021)   No facility-administered encounter medications on file as of 04/07/2021.    Contacted [name] on 04/09/21 for general disease state and medication adherence call.   Patient is not > 5 days past due for refill on the following medications per chart history: no star medications identified   Star Medications: Medication Name/mg Last Fill Days Supply None identified     What concerns do you have about your medications? The patient reports no concerns at this time   The patient denies side effects with her medications.   How often do you forget or accidentally miss a dose? Never  Do you use a pillbox? Yes The patient reports she puts out weekly   Are you having any problems getting your medications from your pharmacy? No  Has the cost of your medications been a concern? Yes  The patient reports she gets Breztri from [facility]  Since last visit with CPP, the following interventions have been made: The patient continues in pulmonary rehab and now physical therapy for improvement of muscular pain, helps with breathing issues   The patient has not had an ED visit since last contact.   The patient denies problems with their health. The patient reports she has been going to Physical Therapy and improving from aches and pain.  she denies  concerns or questions for Debbora Dus, Pharm. D at this time.   Counseled patient on:  [place] job taking medications,  Importance of taking medication daily without missed doses, Benefits of adherence packaging or a pillbox, and Access to CCM team for any cost, medication or pharmacy concerns.   Care Gaps: Annual wellness visit in last year? Yes  11/04/20 Most Recent BP reading:  130/60  83-P  03/10/21   No appointments scheduled within the next 30 days.   Debbora Dus, CPP notified  Avel Sensor, [place] Assistant [phone]  I have reviewed the care management and care coordination activities outlined in this encounter and I am certifying that I agree with the content of this note. No further action required.  Debbora Dus, PharmD Clinical Pharmacist [facility] at [facility] [phone]

## 2021-04-08 DIAGNOSIS — M799 Soft tissue disorder, unspecified: Secondary | ICD-10-CM | POA: Diagnosis not present

## 2021-04-08 DIAGNOSIS — M542 Cervicalgia: Secondary | ICD-10-CM | POA: Diagnosis not present

## 2021-04-08 DIAGNOSIS — M6281 Muscle weakness (generalized): Secondary | ICD-10-CM | POA: Diagnosis not present

## 2021-04-08 DIAGNOSIS — M25511 Pain in right shoulder: Secondary | ICD-10-CM | POA: Diagnosis not present

## 2021-04-13 ENCOUNTER — Other Ambulatory Visit: Payer: Self-pay

## 2021-04-13 ENCOUNTER — Ambulatory Visit
Admission: RE | Admit: 2021-04-13 | Discharge: 2021-04-13 | Disposition: A | Discharge status: Home / Self Care | Payer: Medicare HMO | Source: Ambulatory Visit | Attending: Family Medicine | Admitting: Family Medicine

## 2021-04-13 DIAGNOSIS — M799 Soft tissue disorder, unspecified: Secondary | ICD-10-CM | POA: Diagnosis not present

## 2021-04-13 DIAGNOSIS — M542 Cervicalgia: Secondary | ICD-10-CM | POA: Diagnosis not present

## 2021-04-13 DIAGNOSIS — R928 Other abnormal and inconclusive findings on diagnostic imaging of breast: Secondary | ICD-10-CM | POA: Diagnosis not present

## 2021-04-13 DIAGNOSIS — R922 Inconclusive mammogram: Secondary | ICD-10-CM | POA: Diagnosis not present

## 2021-04-13 DIAGNOSIS — M25511 Pain in right shoulder: Secondary | ICD-10-CM | POA: Diagnosis not present

## 2021-04-13 DIAGNOSIS — N6310 Unspecified lump in the right breast, unspecified quadrant: Secondary | ICD-10-CM | POA: Diagnosis not present

## 2021-04-13 DIAGNOSIS — M6281 Muscle weakness (generalized): Secondary | ICD-10-CM | POA: Diagnosis not present

## 2021-04-14 DIAGNOSIS — J449 Chronic obstructive pulmonary disease, unspecified: Secondary | ICD-10-CM | POA: Diagnosis not present

## 2021-04-15 DIAGNOSIS — M25511 Pain in right shoulder: Secondary | ICD-10-CM | POA: Diagnosis not present

## 2021-04-15 DIAGNOSIS — M799 Soft tissue disorder, unspecified: Secondary | ICD-10-CM | POA: Diagnosis not present

## 2021-04-15 DIAGNOSIS — M542 Cervicalgia: Secondary | ICD-10-CM | POA: Diagnosis not present

## 2021-04-15 DIAGNOSIS — M6281 Muscle weakness (generalized): Secondary | ICD-10-CM | POA: Diagnosis not present

## 2021-04-16 DIAGNOSIS — J449 Chronic obstructive pulmonary disease, unspecified: Secondary | ICD-10-CM | POA: Diagnosis not present

## 2021-04-20 DIAGNOSIS — M25511 Pain in right shoulder: Secondary | ICD-10-CM | POA: Diagnosis not present

## 2021-04-20 DIAGNOSIS — M6281 Muscle weakness (generalized): Secondary | ICD-10-CM | POA: Diagnosis not present

## 2021-04-20 DIAGNOSIS — M542 Cervicalgia: Secondary | ICD-10-CM | POA: Diagnosis not present

## 2021-04-20 DIAGNOSIS — M799 Soft tissue disorder, unspecified: Secondary | ICD-10-CM | POA: Diagnosis not present

## 2021-04-22 DIAGNOSIS — M542 Cervicalgia: Secondary | ICD-10-CM | POA: Diagnosis not present

## 2021-04-22 DIAGNOSIS — M6281 Muscle weakness (generalized): Secondary | ICD-10-CM | POA: Diagnosis not present

## 2021-04-22 DIAGNOSIS — M799 Soft tissue disorder, unspecified: Secondary | ICD-10-CM | POA: Diagnosis not present

## 2021-04-22 DIAGNOSIS — M25511 Pain in right shoulder: Secondary | ICD-10-CM | POA: Diagnosis not present

## 2021-04-27 DIAGNOSIS — K51919 Ulcerative colitis, unspecified with unspecified complications: Secondary | ICD-10-CM | POA: Diagnosis not present

## 2021-04-27 DIAGNOSIS — I7300 Raynaud's syndrome without gangrene: Secondary | ICD-10-CM | POA: Diagnosis not present

## 2021-04-27 DIAGNOSIS — M0579 Rheumatoid arthritis with rheumatoid factor of multiple sites without organ or systems involvement: Secondary | ICD-10-CM | POA: Diagnosis not present

## 2021-04-27 DIAGNOSIS — M542 Cervicalgia: Secondary | ICD-10-CM | POA: Diagnosis not present

## 2021-04-27 DIAGNOSIS — J431 Panlobular emphysema: Secondary | ICD-10-CM | POA: Diagnosis not present

## 2021-04-27 DIAGNOSIS — M810 Age-related osteoporosis without current pathological fracture: Secondary | ICD-10-CM | POA: Diagnosis not present

## 2021-04-27 DIAGNOSIS — Z79899 Other long term (current) drug therapy: Secondary | ICD-10-CM | POA: Diagnosis not present

## 2021-04-28 DIAGNOSIS — M25511 Pain in right shoulder: Secondary | ICD-10-CM | POA: Diagnosis not present

## 2021-04-28 DIAGNOSIS — M6281 Muscle weakness (generalized): Secondary | ICD-10-CM | POA: Diagnosis not present

## 2021-04-28 DIAGNOSIS — M542 Cervicalgia: Secondary | ICD-10-CM | POA: Diagnosis not present

## 2021-04-28 DIAGNOSIS — M799 Soft tissue disorder, unspecified: Secondary | ICD-10-CM | POA: Diagnosis not present

## 2021-05-08 DIAGNOSIS — J449 Chronic obstructive pulmonary disease, unspecified: Secondary | ICD-10-CM | POA: Diagnosis not present

## 2021-05-12 ENCOUNTER — Telehealth: Payer: Self-pay | Admitting: Pulmonary Disease

## 2021-05-12 NOTE — Telephone Encounter (Signed)
Spoke to patient.  Patient stated that she is in need of a new spacer. Patient aware that we do not currently have free spacers. She is aware that an order can be placed to [facility]. She would like to purchase a spacer offline.  She will call back if anything further is needed.

## 2021-05-15 DIAGNOSIS — J449 Chronic obstructive pulmonary disease, unspecified: Secondary | ICD-10-CM | POA: Diagnosis not present

## 2021-06-14 ENCOUNTER — Telehealth: Payer: Self-pay | Admitting: Pulmonary Disease

## 2021-06-14 MED ORDER — FLUCONAZOLE 100 MG PO TABS: 100.0000 mg | ORAL_TABLET | Freq: Every day | ORAL | 0 refills | Status: DC

## 2021-06-14 NOTE — Telephone Encounter (Signed)
Called and spoke to patient about Dr Patsey Berthold recommendation. I have sent in Fluconazole into patient preferred pharmacy. I have the spacer for patient at my desk. She will come and pick this up on 12/12 in the morning.

## 2021-06-14 NOTE — Telephone Encounter (Signed)
Fluconazole 100 mg p.o. daily x10 days.  We can give her a new spacer.

## 2021-06-14 NOTE — Telephone Encounter (Signed)
Dr. Patsey Berthold Please advise:     Pt states she thinks she may have thrush, despite rinsing with warm water and baking soda. Pt wanting to know if there is something else she can do for the thrush. Pt also thinks she may need a new spacer, she has had it over a year and washes it frequently.

## 2021-06-14 NOTE — Addendum Note (Signed)
Addended by: Carlisle Cater on: 06/14/2021 03:31 PM   Modules accepted: Orders

## 2021-06-14 NOTE — Telephone Encounter (Signed)
Pt states she thinks she may have thrush, despite rinsing with warm water and baking soda. Pt wanting to know if there is something else she can do for the thrush. Pt also thinks she may need a new spacer, she has had it over a year and washes it frequently.  Please advise [phone]

## 2021-06-14 NOTE — Telephone Encounter (Signed)
Spoke to patient, who stated that Carrie Hendricks is not affordable. Co pay is over 3,000 dollars. She would like to hold off on switching to bevespi for now and complete course of diflucan.  She is aware that spacer is available for pickup.  Nothing further needed at this time.

## 2021-07-07 IMAGING — CR DG CHEST 2V
1 series · 2 of 2 positions shown · non-contrast
Comparison: 04/04/2017

CLINICAL DATA: Dyspnea

EXAM:
CHEST - 2 VIEW

[Series 1: dg chest 2 view · 0.14mm/px · 2 of 2 slices shown]
[im 1/2]
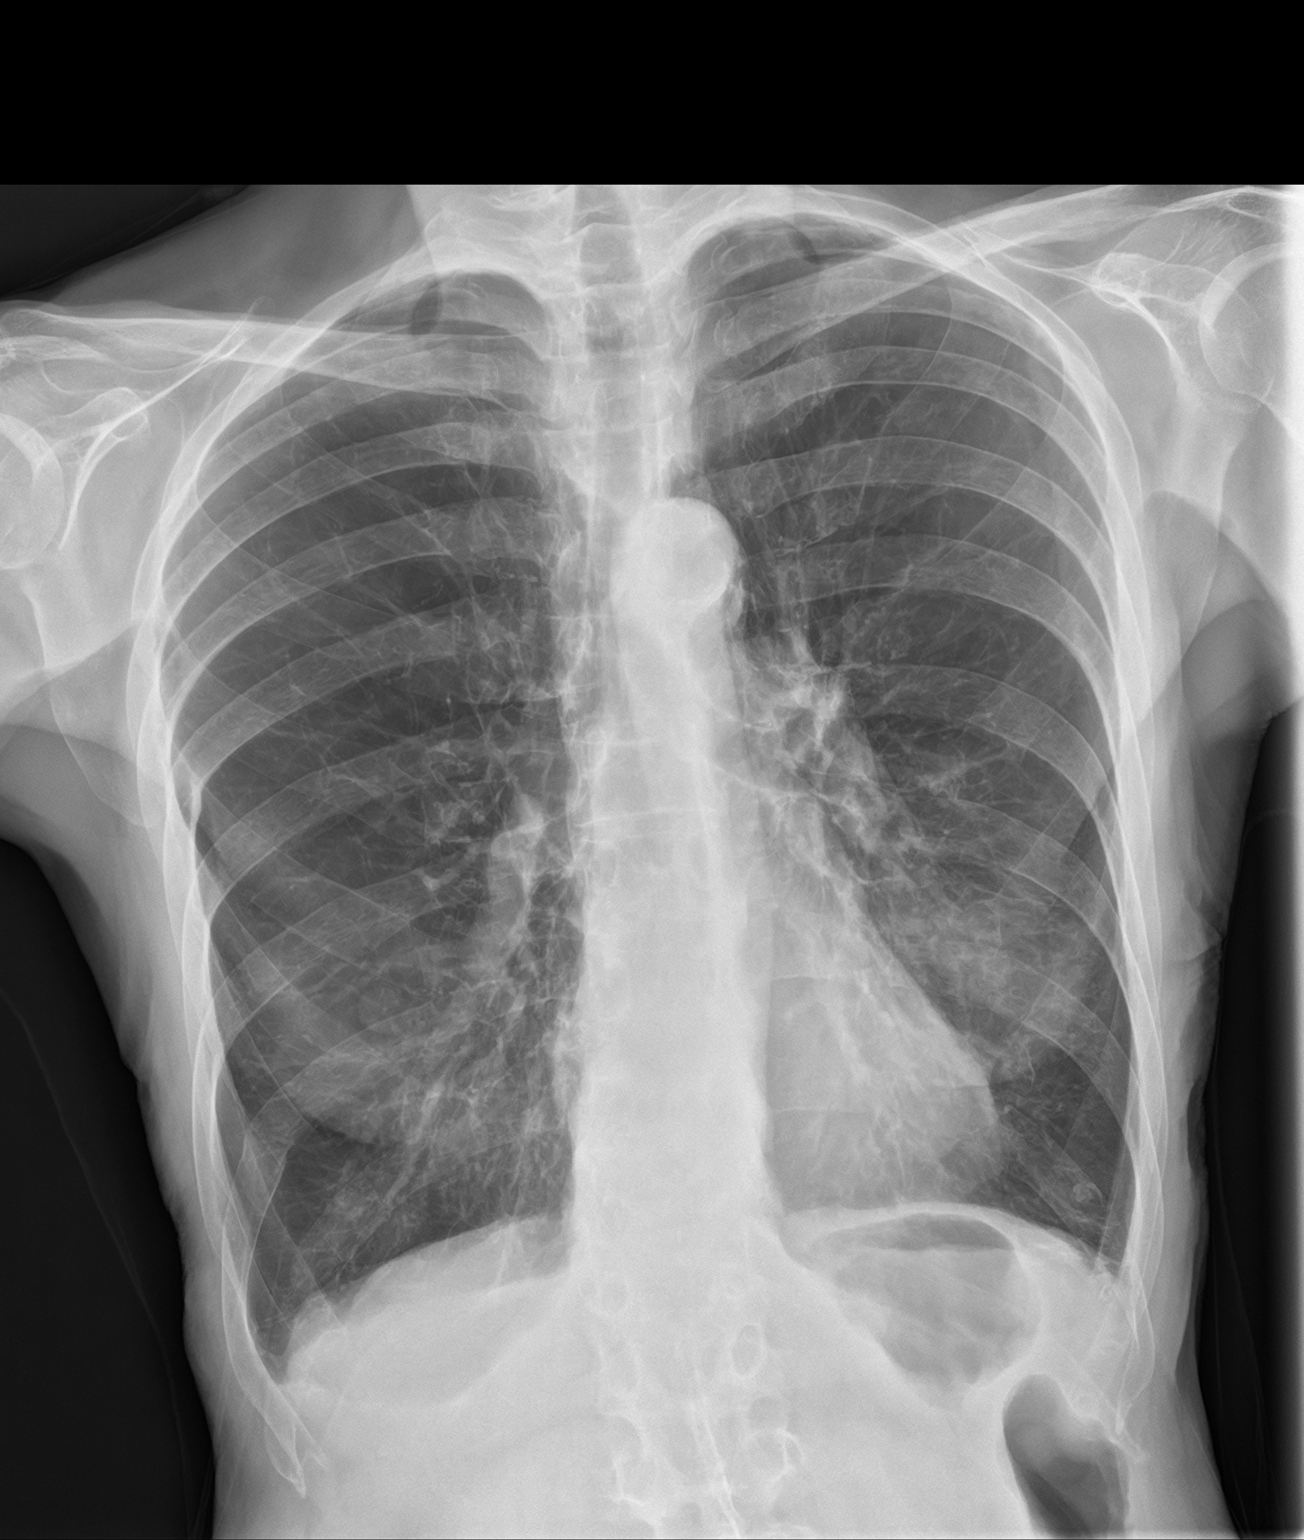
[im 2/2]
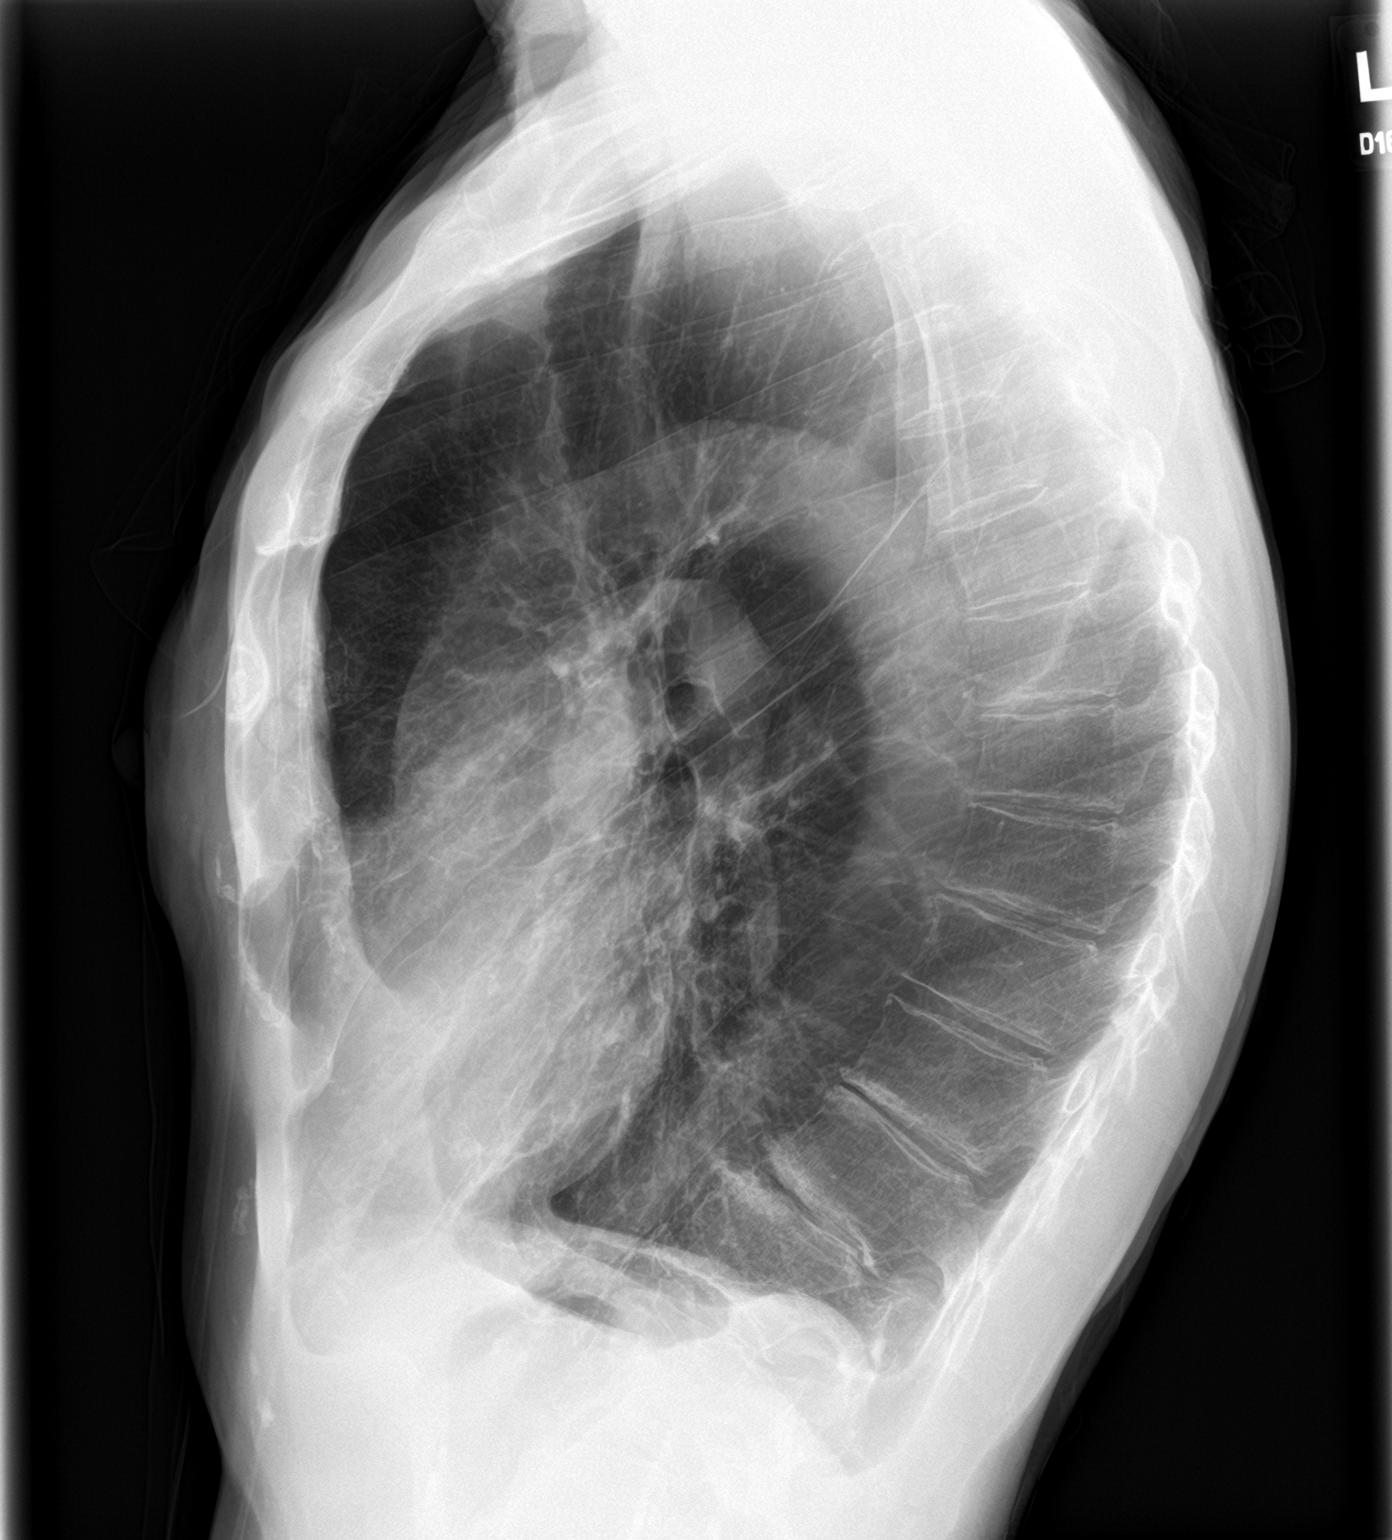

[2 of 2 positions shown; findings below may reference images not displayed]

FINDINGS: Hyperinflation with emphysematous disease. No acute consolidation or
effusion. Stable cardiomediastinal silhouette. Aortic
atherosclerosis. No pneumothorax. Probable nipple shadows over the
lower lungs.
IMPRESSION: No active cardiopulmonary disease. Hyperinflation with emphysematous
disease.

## 2021-07-19 ENCOUNTER — Telehealth: Payer: Self-pay

## 2021-07-19 NOTE — Progress Notes (Addendum)
Chronic Care Management Pharmacy Assistant   Name: Carrie Hendricks  MRN: [id number] DOB: 1951-06-15  Reason for Encounter: CCM (COPD Disease State)   Recent office visits:  None since last CCM contact  Recent consult visits:  06/14/2021 - Pulmonary - Telephone - Start: due to Thrush: fluconazole (DIFLUCAN) 100 MG tablet. 04/27/2021 - Rheumatology - Patient presented for arthritis.  04/13/2021 - [facility] - Patient presented for Mammogram. Abnormal findings; [place] BRST LTD UNI RT INC AX 20.  Physical Therapy visits: Patient presented for physical therapy for cervicalgia, soft tissue disorder, muscle weakness and pain in right shoulder. 04/28/2021 04/22/2021 04/20/2021 04/15/2021 04/08/2021   Hospital visits:  None in previous 6 months  Medications: Outpatient Encounter Medications as of 07/19/2021  Medication Sig   albuterol (VENTOLIN HFA) 108 (90 Base) MCG/ACT inhaler Inhale 2 puffs into the lungs every 6 (six) hours as needed for wheezing or shortness of breath.   alendronate (FOSAMAX) 70 MG tablet Take 1 tablet by mouth once a week.   amLODipine (NORVASC) 5 MG tablet Take 1 tablet (5 mg total) by mouth daily.   Aspirin 81 MG EC tablet Take 81 mg by mouth daily.   Budeson-Glycopyrrol-Formoterol (BREZTRI AEROSPHERE) 160-9-4.8 MCG/ACT AERO Inhale 2 puffs into the lungs in the morning and at bedtime.   Cholecalciferol (VITAMIN D3) 1.25 MG (50000 UT) TABS    etanercept (ENBREL) 50 MG/ML injection Inject 50 mg into the skin once a week.   fluconazole (DIFLUCAN) 100 MG tablet Take 1 tablet (100 mg total) by mouth daily.   hydrochlorothiazide (HYDRODIURIL) 25 MG tablet Take 1 tablet (25 mg total) by mouth daily.   hydroxychloroquine (PLAQUENIL) 200 MG tablet Take 200 mg by mouth daily.   Multiple Vitamin (MULTIVITAMIN) capsule Take 1 capsule by mouth daily.   sulfaSALAzine (AZULFIDINE) 500 MG tablet Take 1 tablet (500 mg total) by mouth 4 (four) times daily. (Patient not  taking: Reported on 01/28/2021)   No facility-administered encounter medications on file as of 07/19/2021.   Contacted patient to discuss COPD disease state  Current COPD regimen:  Bretztri - Inhale 2 puffs twice daily Albuterol - 2 puffs every 6 hours for wheezing, shortness of breath  Any recent hospitalizations or ED visits since last visit with CPP? No  denies COPD symptoms, including Increased shortness of breath , Rescue medicine is not helping, Shortness of breath at rest, Symptoms worse with exercise, Symptoms worse at night, and Wheezing   What recent interventions/DTPs have been made by any provider to improve breathing since last visit:  Continue Breztri 2 puffs twice a day and continue Albuterol as needed.    Have you had exacerbation/flare-up since last visit? Yes  What do you do when you are short of breath?  Other Patient states she does breathing exercises when she is short of breath. Patient states she only uses her rescue inhaler 1 times a week or less.   Current tobacco use? Quit 27 years ago.  Respiratory Devices/Equipment Do you have a nebulizer? Yes - Provider told patient to no longer use it.  Do you use a Peak Flow Meter? No Do you use a maintenance inhaler? Yes - Breztri How often do you forget to use your daily inhaler? Never Do you use a rescue inhaler? Yes - Albuiterol How often do you use your rescue inhaler?   less than 1x a week Do you use a spacer with your inhaler? Yes  Adherence Review: Does the patient have >5 day gap  between last estimated fill date for maintenance inhaler medications? No - Patient gets it from manufacturer. Patient is approved for Breztri until 07/03/2022.   PCP appointment on 07/22/2021 Scheduled follow up visit with Debbora Dus on 10/04/2021 at 11:00 AM.  Care Gaps: Annual wellness visit in last year? Yes 11/04/2020  Most Recent BP reading: 130/60 on 03/10/2021  Star Rated Drugs Medication Name  Last Fill Date  Day  supply No start rating drugs noted  Debbora Dus, CPP notified  Marijean Niemann, [place] Assistant [phone]  I have reviewed the care management and care coordination activities outlined in this encounter and I am certifying that I agree with the content of this note. No further action required.  Debbora Dus, PharmD Clinical Pharmacist [facility] [phone]

## 2021-07-22 ENCOUNTER — Ambulatory Visit: Payer: Medicare HMO | Admitting: Pulmonary Disease

## 2021-07-22 ENCOUNTER — Other Ambulatory Visit: Payer: Self-pay

## 2021-07-22 ENCOUNTER — Encounter: Payer: Self-pay | Admitting: Pulmonary Disease

## 2021-07-22 ENCOUNTER — Other Ambulatory Visit
Admission: RE | Admit: 2021-07-22 | Discharge: 2021-07-22 | Disposition: A | Discharge status: Home / Self Care | Payer: Medicare HMO | Attending: Pulmonary Disease | Admitting: Pulmonary Disease

## 2021-07-22 VITALS — BP 136/82 | HR 81 | Temp 98.0°F | Ht 66.0 in | Wt 103.4 lb

## 2021-07-22 DIAGNOSIS — I272 Pulmonary hypertension, unspecified: Secondary | ICD-10-CM | POA: Diagnosis not present

## 2021-07-22 DIAGNOSIS — J9611 Chronic respiratory failure with hypoxia: Secondary | ICD-10-CM | POA: Diagnosis not present

## 2021-07-22 DIAGNOSIS — J449 Chronic obstructive pulmonary disease, unspecified: Secondary | ICD-10-CM | POA: Diagnosis not present

## 2021-07-22 DIAGNOSIS — M0569 Rheumatoid arthritis of multiple sites with involvement of other organs and systems: Secondary | ICD-10-CM | POA: Diagnosis not present

## 2021-07-22 DIAGNOSIS — M3500 Sicca syndrome, unspecified: Secondary | ICD-10-CM | POA: Insufficient documentation

## 2021-07-22 DIAGNOSIS — R64 Cachexia: Secondary | ICD-10-CM | POA: Diagnosis not present

## 2021-07-22 NOTE — Progress Notes (Signed)
Subjective:    Patient ID: Carrie Hendricks, female    DOB: 1951/04/13, 71 y.o.   MRN: [id number] Chief Complaint  Patient presents with   Follow-up   Requesting MD/Service: Zenon Mayo, MD Date of initial consultation: 02/17/17 by Dr. Alva Garnet Reason for consultation: DOE, COPD   PT PROFILE: 71 y.o. female, former smoker, referred for evaluation of progressive DOE and history of COPD. Former patient of Dr. Merton Border. Former smoker quit 2006.   DATA: 04/04/17 CXR: Severe hyperinflation and hyperlucency without acute findings 05/18/17 PFTs: Very severe obstruction, FEV1 0.96 L (38%), hyperinflation-TLC 6.92 L (127%), RV 4.45 L (208%), DLCO 4.8 (28%), DLCO/VA 1.35 (37%) 07/18/2019 ONO: Had overnight oximetry which shows saturations as low as 78%, qualified for nocturnal O2, qualified also for daytime O2 but refuses daytime O2 07/24/2019 2D echo: Severe pulmonary hypertension noted (76 mmHg), grade 1 diastolic dysfunction noted, LVEF 60 to 65% 11/13/2019 PFT: FEV1 0.70 L or 28% predicted, FVC 1.88 L or 58% predicted there is hyperinflation with TLC at 134% predicted and air trapping with RV at 215% predicted.  Diffusion capacity was severely reduced at 16%, consistent with very severe COPD 10/31/2019 chest x-ray: Hyperinflation and emphysematous lung disease without active disease 05/17/2020 alpha-1: Phenotype MM, normal.  Level 127 mg/dL 08/06/2020 2D echo: LVEF 60 to 65%.  Grade I DD, mild MR.  Right ventricular systolic pressure estimated 58.8 mmHg (previously 76 mmHg).   HPI Carrie Hendricks presents for follow-up.  We last saw her on 10 March 2021.  As noted she is a former smoker with very severe COPD and chronic hypoxic respiratory failure.  She has moderate pulmonary hypertension.  Pulmonary hypertension has improved with supplemental oxygen.  She is compliant with Breztri therapy and with supplemental oxygen.  She has not had recent exacerbations.  She has not had any fevers, chills or  sweats.  No cough over her usual, sputum has not changed in character.  No hemoptysis.  Continues to have "good days and bad days" she is compliant with her oxygen therapy at 2 L/min continuous or 3 L intermittent.  She does well with Breztri and does not have to use rescue albuterol except very rarely.  Previously she had had some issues with thrush related to [facility] due to not rinsing her mouth afterwards.  She is now very meticulous with doing this after Breztri use and has not had any issues.  Her only complaint is that of very dry mouth which predated the [facility] therapy.  This has been longstanding for her.  Recall that she has pretty significant rheumatoid arthritis.  She usually averages use of albuterol at 1 time per week. She continues to be abstinent of cigarettes.  She does not endorse any other symptomatology.  Her joint pain is well controlled.  She is on Plaquenil, she follows with rheumatology at [facility].    Review of Systems A 10 point review of systems was performed and it is as noted above otherwise negative.  Patient Active Problem List   Diagnosis Date Noted   Medicare annual wellness visit, subsequent 11/04/2020   Chronic respiratory failure with hypoxia ([place]) 04/28/2020   Healthcare maintenance 04/28/2020   Elevated hemoglobin ([place]) 03/08/2019   COPD, very severe ([place]) 08/15/2018   Sleep disorder 10/10/2017   Screening mammogram, encounter for 09/12/2017   Ulcerative colitis ([place]) 02/01/2017   Lumbar degenerative disc disease 01/20/2017   Scoliosis 06/28/2016   Welcome to Medicare preventive visit 06/10/2016   Estrogen deficiency 06/10/2016  Constipation 03/02/2016   Underweight 03/02/2016   Shortness of breath 05/16/2014   History of COPD 05/16/2014   Stress reaction 05/16/2014   Connective tissue disease ([place]) 03/05/2014   Pedal edema 10/15/2013   Pulmonary hypertension ([place]) 10/10/2011   Encounter for routine gynecological examination 07/11/2011    HEMORRHOIDS, INTERNAL, WITH BLEEDING 03/15/2010   Essential hypertension 11/16/2006   Rheumatoid arthritis ([place]) 11/16/2006   Osteoporosis 11/16/2006   Social History   Tobacco Use   Smoking status: Former    Packs/day: 1.25    Years: 30.00    Pack years: 37.50    Types: Cigarettes    Quit date: 2006    Years since quitting: 17.0   Smokeless tobacco: Never   Tobacco comments:    Quit in 8/07  Substance Use Topics   Alcohol use: No    Alcohol/week: 0.0 standard drinks   No Known Allergies  Current Meds  Medication Sig   albuterol (VENTOLIN HFA) 108 (90 Base) MCG/ACT inhaler Inhale 2 puffs into the lungs every 6 (six) hours as needed for wheezing or shortness of breath.   alendronate (FOSAMAX) 70 MG tablet Take 1 tablet by mouth once a week.   amLODipine (NORVASC) 5 MG tablet Take 1 tablet (5 mg total) by mouth daily.   Aspirin 81 MG EC tablet Take 81 mg by mouth daily.   Budeson-Glycopyrrol-Formoterol (BREZTRI AEROSPHERE) 160-9-4.8 MCG/ACT AERO Inhale 2 puffs into the lungs in the morning and at bedtime.   Cholecalciferol (VITAMIN D3) 1.25 MG (50000 UT) TABS    etanercept (ENBREL) 50 MG/ML injection Inject 50 mg into the skin once a week.   hydrochlorothiazide (HYDRODIURIL) 25 MG tablet Take 1 tablet (25 mg total) by mouth daily.   hydroxychloroquine (PLAQUENIL) 200 MG tablet Take 200 mg by mouth daily.   Multiple Vitamin (MULTIVITAMIN) capsule Take 1 capsule by mouth daily.   Immunization History  Administered Date(s) Administered   Fluad Quad(high Dose 65+) 03/08/2019, 04/17/2020   Hep A / Hep B 11/03/2016, 12/06/2016   Hepatitis B, adult 05/09/2017   Influenza Split 04/03/2011   Influenza Whole 04/03/2010   Influenza, High Dose Seasonal PF 04/08/2016, 03/31/2017, 04/21/2018   Influenza,inj,Quad PF,6+ Mos 03/05/2014   Influenza-Unspecified 04/03/2013, 03/05/2014, 05/03/2015, 04/17/2020, 04/17/2021   PFIZER Comirnaty(Gray Top)Covid-19 Tri-Sucrose Vaccine 08/15/2019,  09/05/2019, 04/17/2020   PFIZER(Purple Top)SARS-COV-2 Vaccination 08/15/2019, 09/05/2019, 04/17/2020, 01/05/2021   Pneumococcal Conjugate-13 06/10/2016   Pneumococcal Polysaccharide-23 03/28/2007, 10/15/2013, 08/15/2018   Td 06/11/2010       Objective:   Physical Exam BP 136/82 (BP Location: Left Arm, Patient Position: Sitting, Cuff Size: Normal)    Pulse 81    Temp 98 F (36.7 C) (Oral)    Ht 5' 6"  (1.676 m)    Wt 103 lb 6.4 oz (46.9 kg)    SpO2 90%    BMI 16.69 kg/m  Gen: Cachectic, chronically ill.  Use of accessories, chronic.  No overt tachypnea HEENT: NCAT, sclerae anicteric, dry mucous membranes, no thrush.  No oral lesions. Neck: No JVD, trachea midline, no crepitus Thorax: Increased AP diameter, Hoover's sign present Lungs: breath sounds  distant throughout without wheezes nor other adventitious sounds Cardiovascular: RRR, no murmurs, distant tones Abdomen: Soft, nontender, normal BS Ext: without clubbing, cyanosis.  No pedal edema noted today.  She does have stigmata of rheumatoid arthritis on her hands/wrists Neuro: grossly intact Skin: Limited exam, no open lesions noted, she does have some ecchymoses/senile purpuras    Assessment & Plan:     ICD-10-CM  1. Sicca, unspecified type ([place])  M35.00 Sjogrens syndrome-A extractable nuclear antibody    Sjogrens syndrome-B extractable nuclear antibody   We will check Sjogren's panel today She has pretty advanced rheumatoid arthritis May be Sjogren's associated with this    2. Rheumatoid arthritis of multiple sites with involvement of other organs and systems ([place])  M05.69    On Plaquenil Follows with [facility]ology at [facility] This issue adds complexity to her management    3. COPD, very severe ([place])  J44.9    Continue Breztri 2 puffs twice a day Continuously and albuterol Remains abstinent of cigarettes    4. Chronic respiratory failure with hypoxia (HCC)  J96.11    Continue oxygen at 2 L/min continuous for 3  L/min pulsed Patient compliant with therapy    5. Pulmonary hypertension (HCC)  I27.20    This has improved with oxygen supplementation Continue oxygen    6. 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  Continue her current therapy.  We are checking Sjogren's panel given her sicca symptoms which predated use of Breztri.  We will see her in follow-up in 3 months time she is to contact us prior to that time should any new difficulties arise.  Renold Don, MD Advanced Bronchoscopy PCCM [facility]    *This note was dictated using voice recognition software/Dragon.  Despite best efforts to proofread, errors can occur which can change the meaning. Any transcriptional errors that result from this process are unintentional and may not be fully corrected at the time of dictation.

## 2021-07-22 NOTE — Patient Instructions (Signed)
We have put in an order for you to get blood work at the lab this will tell us about the potential Sjogren's that can cause dry mouth.  Continue using your Breztri and oxygen.  We will see him in follow-up in 3 months time call sooner should any new problems arise.

## 2021-07-24 LAB — SJOGRENS SYNDROME-A EXTRACTABLE NUCLEAR ANTIBODY: SSA (Ro) (ENA) Antibody, IgG: <0.2 AI (ref 0.0–0.9)

## 2021-07-24 LAB — SJOGRENS SYNDROME-B EXTRACTABLE NUCLEAR ANTIBODY: SSB (La) (ENA) Antibody, IgG: <0.2 AI (ref 0.0–0.9)

## 2021-07-27 ENCOUNTER — Telehealth: Payer: Self-pay | Admitting: Pulmonary Disease

## 2021-07-27 NOTE — Telephone Encounter (Signed)
Rx faxed to [facility].  Received successful fax confirmation.

## 2021-07-27 NOTE — Telephone Encounter (Signed)
Received Rx for [employer]. Rx has been placed in Dr. Domingo Dimes look at folder for signature.

## 2021-09-01 DIAGNOSIS — Z79899 Other long term (current) drug therapy: Secondary | ICD-10-CM | POA: Diagnosis not present

## 2021-09-03 ENCOUNTER — Encounter: Payer: Self-pay | Admitting: Family Medicine

## 2021-09-03 ENCOUNTER — Ambulatory Visit (INDEPENDENT_AMBULATORY_CARE_PROVIDER_SITE_OTHER): Payer: Medicare HMO | Admitting: Family Medicine

## 2021-09-03 ENCOUNTER — Other Ambulatory Visit: Payer: Self-pay

## 2021-09-03 DIAGNOSIS — B370 Candidal stomatitis: Secondary | ICD-10-CM

## 2021-09-03 MED ORDER — NYSTATIN 100000 UNIT/ML MT SUSP: OROMUCOSAL | 0 refills | Status: DC

## 2021-09-03 NOTE — Assessment & Plan Note (Signed)
White coating on tongue and globus sensation in throat in pt who uses a steroid inhaler and 02 with copd and RA ?Some improvement with fluconazole from pulmonary  ?Neg Sjogrens testing  ?She does gargle when using inhalers and has changed toothbrush  ?Today there is still slt coating on back of tongue  ?tx with nystatin solution  ?If not improvement then consider ENT referral  ? ?

## 2021-09-03 NOTE — Progress Notes (Signed)
Subjective:    Patient ID: Carrie Hendricks, female    DOB: [birth year], 71 y.o.   MRN: [id number]  This visit occurred during the SARS-CoV-2 public health emergency.  Safety protocols were in place, including screening questions prior to the visit, additional usage of staff PPE, and extensive cleaning of exam room while observing appropriate contact time as indicated for disinfecting solutions.   HPI Pt presents for a problem with her tongue   Wt Readings from Last 3 Encounters:  09/03/21 105 lb (47.6 kg)  07/22/21 103 lb 6.4 oz (46.9 kg)  03/10/21 99 lb (44.9 kg)   16.95 kg/m  Has a white coating on tongue  Feels bumpy   Pulmonary doctor treated her for thrush (from [place]) It helped a little  Tested for sjogren's and all was negative   Glands feels swollen   No ST Back of throat feels thick   Thinks she has developed allergies Pnd  Runny nose  (crusty from 02 at night)  Not congested    Tried claritin Saline nasal spray  Menthol cough drops   Patient Active Problem List   Diagnosis Date Noted   Ritta Slot 09/03/2021   Medicare annual wellness visit, subsequent 11/04/2020   Chronic respiratory failure with hypoxia ([place]) 04/28/2020   Healthcare maintenance 04/28/2020   Elevated hemoglobin ([place]) 03/08/2019   COPD, very severe ([place]) 08/15/2018   Sleep disorder 10/10/2017   Screening mammogram, encounter for 09/12/2017   Ulcerative colitis ([place]) 02/01/2017   Lumbar degenerative disc disease 01/20/2017   Scoliosis 06/28/2016   Welcome to Medicare preventive visit 06/10/2016   Estrogen deficiency 06/10/2016   Constipation 03/02/2016   Underweight 03/02/2016   Shortness of breath 05/16/2014   History of COPD 05/16/2014   Stress reaction 05/16/2014   Connective tissue disease ([place]) 03/05/2014   Pedal edema 10/15/2013   Pulmonary hypertension ([place]) 10/10/2011   Encounter for routine gynecological examination 07/11/2011   HEMORRHOIDS, INTERNAL, WITH BLEEDING  03/15/2010   Essential hypertension 11/16/2006   Rheumatoid arthritis ([place]) 11/16/2006   Osteoporosis 11/16/2006   Past Medical History:  Diagnosis Date   Arthritis    rheumatoid   Complication of anesthesia    Hypertension    Osteoporosis    PONV (postoperative nausea and vomiting)    Pulmonary hypertension (HCC)    Rheumatoid arthritis(714.0)    ? scleroderma   Past Surgical History:  Procedure Laterality Date   colonosc neg  11/08   COLONOSCOPY WITH PROPOFOL N/A 09/30/2016   Procedure: COLONOSCOPY WITH PROPOFOL;  Surgeon: Jonathon Bellows, MD;  Location: [facility] ENDOSCOPY;  Service: Endoscopy;  Laterality: N/A;   COLONOSCOPY WITH PROPOFOL N/A 12/08/2017   Procedure: COLONOSCOPY WITH PROPOFOL;  Surgeon: Jonathon Bellows, MD;  Location[facility] ENDOSCOPY;  Service: Gastroenterology;  Laterality: N/A;   Dexa  07/2005   OP hip- 3.0   ESOPHAGOGASTRODUODENOSCOPY (EGD) WITH PROPOFOL N/A 07/01/2016   Procedure: ESOPHAGOGASTRODUODENOSCOPY (EGD) WITH PROPOFOL;  Surgeon: Jonathon Bellows, MD;  Location: [facility] ENDOSCOPY;  Service: Endoscopy;  Laterality: N/A;   FLEXIBLE SIGMOIDOSCOPY N/A 07/01/2016   Procedure: FLEXIBLE SIGMOIDOSCOPY;  Surgeon: Jonathon Bellows, MD;  Location: [facility] ENDOSCOPY;  Service: Endoscopy;  Laterality: N/A;   LEEP  2005   Surgery- tendon release in hand  2006   TUBAL LIGATION     Social History   Tobacco Use   Smoking status: Former    Packs/day: 1.25    Years: 30.00    Pack years: 37.50    Types: Cigarettes    Quit  date: 2006    Years since quitting: 17.1   Smokeless tobacco: Never   Tobacco comments:    Quit in 8/07  Vaping Use   Vaping Use: Never used  Substance Use Topics   Alcohol use: No    Alcohol/week: 0.0 standard drinks   Drug use: No   Family History  Problem Relation Age of Onset   Heart disease Mother    Diabetes Mother    Hypertension Father    Macular degeneration Father    Dementia Father        early   Arthritis Father        RA   Alcohol abuse Father     Breast cancer Sister 44   No Known Allergies Current Outpatient Medications on File Prior to Visit  Medication Sig Dispense Refill   albuterol (VENTOLIN HFA) 108 (90 Base) MCG/ACT inhaler Inhale 2 puffs into the lungs every 6 (six) hours as needed for wheezing or shortness of breath. 54 g 0   alendronate (FOSAMAX) 70 MG tablet Take 1 tablet by mouth once a week.     amLODipine (NORVASC) 5 MG tablet Take 1 tablet (5 mg total) by mouth daily. 90 tablet 3   Aspirin 81 MG EC tablet Take 81 mg by mouth daily.     Budeson-Glycopyrrol-Formoterol (BREZTRI AEROSPHERE) 160-9-4.8 MCG/ACT AERO Inhale 2 puffs into the lungs in the morning and at bedtime. 10.7 g 11   Cholecalciferol (VITAMIN D3) 1.25 MG (50000 UT) TABS      etanercept (ENBREL) 50 MG/ML injection Inject 50 mg into the skin once a week.     hydrochlorothiazide (HYDRODIURIL) 25 MG tablet Take 1 tablet (25 mg total) by mouth daily. 90 tablet 3   hydroxychloroquine (PLAQUENIL) 200 MG tablet Take 200 mg by mouth daily.     Multiple Vitamin (MULTIVITAMIN) capsule Take 1 capsule by mouth daily.     sulfaSALAzine (AZULFIDINE) 500 MG tablet Take 1 tablet (500 mg total) by mouth 4 (four) times daily. (Patient not taking: Reported on 01/28/2021) 360 tablet 3   No current facility-administered medications on file prior to visit.     Review of Systems  Constitutional:  Negative for activity change, appetite change, fatigue, fever and unexpected weight change.  HENT:  Positive for postnasal drip and sneezing. Negative for congestion, dental problem, ear pain, facial swelling, mouth sores, rhinorrhea, sinus pressure, sore throat, trouble swallowing and voice change.   Eyes:  Negative for pain, redness and visual disturbance.  Respiratory:  Positive for shortness of breath. Negative for cough and wheezing.   Cardiovascular:  Negative for chest pain and palpitations.  Gastrointestinal:  Negative for abdominal pain, blood in stool, constipation and  diarrhea.  Endocrine: Negative for polydipsia and polyuria.  Genitourinary:  Negative for dysuria, frequency and urgency.  Musculoskeletal:  Negative for arthralgias, back pain and myalgias.  Skin:  Negative for pallor and rash.  Allergic/Immunologic: Negative for environmental allergies.  Neurological:  Negative for dizziness, syncope and headaches.  Hematological:  Negative for adenopathy. Does not bruise/bleed easily.  Psychiatric/Behavioral:  Negative for decreased concentration and dysphoric mood. The patient is not nervous/anxious.       Objective:   Physical Exam Constitutional:      General: She is not in acute distress.    Appearance: Normal appearance. She is not ill-appearing or diaphoretic.     Comments: Underweight  Baseline   HENT:     Head: Normocephalic and atraumatic.     Right Ear: Tympanic  membrane, ear canal and external ear normal.     Left Ear: Tympanic membrane, ear canal and external ear normal.     Nose: No congestion.     Comments: Boggy nares Wears 02 with [place]    Mouth/Throat:     Mouth: Mucous membranes are moist.     Pharynx: No oropharyngeal exudate or posterior oropharyngeal erythema.     Comments: Scant white coating on back of tongue Unable to scrape it  No tenderness  No lesions in mouth or throat  Scant clear pnd  Some cobblestoning Eyes:     General: No scleral icterus.       Right eye: No discharge.        Left eye: No discharge.     Conjunctiva/sclera: Conjunctivae normal.     Pupils: Pupils are equal, round, and reactive to light.  Cardiovascular:     Rate and Rhythm: Regular rhythm. Bradycardia present.  Pulmonary:     Effort: Pulmonary effort is normal.     Breath sounds: No wheezing, rhonchi or rales.     Comments: Diffusely distant bs  Skin:    Coloration: Skin is not jaundiced.     Findings: No erythema or rash.  Neurological:     Mental Status: She is alert.     Cranial Nerves: No cranial nerve deficit.  Psychiatric:         Mood and Affect: Mood normal.          Assessment & Plan:   Problem List Items Addressed This Visit       Digestive   Thrush    White coating on tongue and globus sensation in throat in pt who uses a steroid inhaler and 02 with copd and RA Some improvement with fluconazole from pulmonary  Neg Sjogrens testing  She does gargle when using inhalers and has changed toothbrush  Today there is still slt coating on back of tongue  tx with nystatin solution  If not improvement then consider ENT referral        Relevant Medications   nystatin (MYCOSTATIN) 100000 UNIT/ML suspension

## 2021-09-03 NOTE — Patient Instructions (Signed)
Try the nystatin oral solution for thrush as directed  ? ?If not better in 10 days please let us know  ? ?Get new toothbrush regularly also  ? ? ? ?If worse/ sore throat/trouble swallowing please let us know  ?

## 2021-09-12 ENCOUNTER — Encounter: Payer: Self-pay | Admitting: Family Medicine

## 2021-09-16 ENCOUNTER — Telehealth: Payer: Self-pay

## 2021-09-16 NOTE — Telephone Encounter (Signed)
If this did not help it may not be thrush. (In that case would recommend ENT evaluation) ? ?Did the fluconazole that she was originally given by pulmonary help much?  ?

## 2021-09-16 NOTE — Telephone Encounter (Signed)
Was seen on 09/02/21 for thrush, gave Nystatin solution which has not helped -still has 2 days left of the oral medication. Symptoms not worse but not better at all. White coating on tongue still and hoarse voice, and tongue feels "funny." Her glands feel swollen and off. Patient feels like these symptoms are coming from using Breztri inhaler even though she rinses her mouth out as she is suppose to. She has told her pulmonologist this concern but patient does not feel like she is been heard. Not sure what to do about this now. No fever. ?

## 2021-09-17 NOTE — Telephone Encounter (Signed)
Patient notified as instructed by telephone and verbalized understanding. Patient stated that she does not think that it is thrush either. Patient stated that she is doing some better today and will hold off on seeing an ENT at this time.  Patient stated that she will call back if she changes her mind. ?Patient stated that the fluconazole did help a little. Patient stated that she rinses her month really good after using her inhalers. Patient stated that she has an appointment scheduled next month with her pulmonologist and will talk with him about it .  ?

## 2021-09-17 NOTE — Telephone Encounter (Signed)
Aware, will watch for correspondence ? ?

## 2021-09-21 DIAGNOSIS — Z79899 Other long term (current) drug therapy: Secondary | ICD-10-CM | POA: Diagnosis not present

## 2021-09-21 DIAGNOSIS — M818 Other osteoporosis without current pathological fracture: Secondary | ICD-10-CM | POA: Diagnosis not present

## 2021-09-21 DIAGNOSIS — I7300 Raynaud's syndrome without gangrene: Secondary | ICD-10-CM | POA: Diagnosis not present

## 2021-09-21 DIAGNOSIS — Z981 Arthrodesis status: Secondary | ICD-10-CM | POA: Diagnosis not present

## 2021-09-21 DIAGNOSIS — M1812 Unilateral primary osteoarthritis of first carpometacarpal joint, left hand: Secondary | ICD-10-CM | POA: Diagnosis not present

## 2021-09-21 DIAGNOSIS — K51919 Ulcerative colitis, unspecified with unspecified complications: Secondary | ICD-10-CM | POA: Diagnosis not present

## 2021-09-21 DIAGNOSIS — M069 Rheumatoid arthritis, unspecified: Secondary | ICD-10-CM | POA: Diagnosis not present

## 2021-09-21 DIAGNOSIS — M0579 Rheumatoid arthritis with rheumatoid factor of multiple sites without organ or systems involvement: Secondary | ICD-10-CM | POA: Diagnosis not present

## 2021-09-21 DIAGNOSIS — M06841 Other specified rheumatoid arthritis, right hand: Secondary | ICD-10-CM | POA: Diagnosis not present

## 2021-09-21 DIAGNOSIS — M06842 Other specified rheumatoid arthritis, left hand: Secondary | ICD-10-CM | POA: Diagnosis not present

## 2021-09-21 DIAGNOSIS — M349 Systemic sclerosis, unspecified: Secondary | ICD-10-CM | POA: Diagnosis not present

## 2021-09-23 ENCOUNTER — Encounter: Payer: Self-pay | Admitting: Gastroenterology

## 2021-09-28 DIAGNOSIS — M810 Age-related osteoporosis without current pathological fracture: Secondary | ICD-10-CM | POA: Diagnosis not present

## 2021-10-04 ENCOUNTER — Telehealth: Payer: Medicare HMO

## 2021-10-28 ENCOUNTER — Ambulatory Visit: Payer: Medicare HMO | Admitting: Pulmonary Disease

## 2021-10-28 ENCOUNTER — Encounter: Payer: Self-pay | Admitting: Pulmonary Disease

## 2021-10-28 VITALS — BP 142/80 | HR 63 | Temp 97.6°F | Ht 66.0 in | Wt 104.0 lb

## 2021-10-28 DIAGNOSIS — J9611 Chronic respiratory failure with hypoxia: Secondary | ICD-10-CM

## 2021-10-28 DIAGNOSIS — J449 Chronic obstructive pulmonary disease, unspecified: Secondary | ICD-10-CM | POA: Diagnosis not present

## 2021-10-28 DIAGNOSIS — I272 Pulmonary hypertension, unspecified: Secondary | ICD-10-CM | POA: Diagnosis not present

## 2021-10-28 DIAGNOSIS — R64 Cachexia: Secondary | ICD-10-CM | POA: Diagnosis not present

## 2021-10-28 NOTE — Progress Notes (Signed)
Subjective:    Patient ID: Carrie Hendricks, female    DOB: 05/28/1951, 71 y.o.   MRN: [id number] Patient Care Team: Phil Dopp, [facility] as Pharmacist (Pharmacist) [name], MD as Consulting Physician (Gastroenterology)  Chief Complaint  Patient presents with   Follow-up    Occ SOB with exertion.     HPI Carrie Hendricks presents for follow-up.  We last saw her on 22 July 2021.  As noted she is a former smoker with very severe COPD and chronic hypoxic respiratory failure.  She has moderate pulmonary hypertension.  Pulmonary hypertension has improved with supplemental oxygen.  She is compliant with Breztri therapy and with supplemental oxygen.  She has not had recent exacerbations.   She has not had any fevers, chills or sweats.  No cough over her usual, sputum has not changed in character.  No hemoptysis.  Continues to have "good days and bad days" she is compliant with her oxygen therapy at 2 L/min continuous if using tanks or 3 L intermittent using POC.  She does well with Breztri and does not have to use rescue albuterol except very rarely.  Previously she had had some issues with thrush related to [facility] due to not rinsing her mouth afterwards.  She is now very meticulous with doing this after Breztri use and has not had any issues.  She has pretty significant rheumatoid arthritis and associated sicca syndrome.  She usually averages use of albuterol at 1 time per week. She continues to be abstinent of cigarettes.  She does not endorse any other symptomatology.  Her joint pain is well controlled.  She is on Plaquenil, she follows with rheumatology at [facility].   DATA: 04/04/17 CXR: Severe hyperinflation and hyperlucency without acute findings 05/18/17 PFTs: Very severe obstruction, FEV1 0.96 L (38%), hyperinflation-TLC 6.92 L (127%), RV 4.45 L (208%), DLCO 4.8 (28%), DLCO/VA 1.35 (37%) 07/18/2019 ONO: Had overnight oximetry which shows saturations as low as 78%, qualified for  nocturnal O2, qualified also for daytime O2 but refuses daytime O2 07/24/2019 2D echo: Severe pulmonary hypertension noted (76 mmHg), grade 1 diastolic dysfunction noted, LVEF 60 to 65% 11/13/2019 PFT: FEV1 0.70 L or 28% predicted, FVC 1.88 L or 58% predicted there is hyperinflation with TLC at 134% predicted and air trapping with RV at 215% predicted.  Diffusion capacity was severely reduced at 16%, consistent with very severe COPD 10/31/2019 chest x-ray: Hyperinflation and emphysematous lung disease without active disease 05/17/2020 alpha-1: Phenotype MM, normal.  Level 127 mg/dL 78/29/5621 2D echo: LVEF 60 to 65%.  Grade I DD, mild MR.  Right ventricular systolic pressure estimated 58.8 mmHg (previously 76 mmHg).  Review of Systems A 10 point review of systems was performed and it is as noted above otherwise negative.  Patient Active Problem List   Diagnosis Date Noted   [name] 09/03/2021   Medicare annual wellness visit, subsequent 11/04/2020   Chronic respiratory failure with hypoxia (HCC) 04/28/2020   Healthcare maintenance 04/28/2020   Elevated hemoglobin (HCC) 03/08/2019   COPD, very severe (HCC) 08/15/2018   Sleep disorder 10/10/2017   Screening mammogram, encounter for 09/12/2017   Ulcerative colitis (HCC) 02/01/2017   Lumbar degenerative disc disease 01/20/2017   Scoliosis 06/28/2016   Welcome to Medicare preventive visit 06/10/2016   Estrogen deficiency 06/10/2016   Constipation 03/02/2016   Underweight 03/02/2016   Shortness of breath 05/16/2014   History of COPD 05/16/2014   Stress reaction 05/16/2014   Connective tissue disease (HCC) 03/05/2014   Pedal  edema 10/15/2013   Pulmonary hypertension (HCC) 10/10/2011   Encounter for routine gynecological examination 07/11/2011   HEMORRHOIDS, INTERNAL, WITH BLEEDING 03/15/2010   Essential hypertension 11/16/2006   Rheumatoid arthritis (HCC) 11/16/2006   Osteoporosis 11/16/2006   Social History   Tobacco Use   Smoking  status: Former    Packs/day: 1.25    Years: 30.00    Pack years: 37.50    Types: Cigarettes    Quit date: 2006    Years since quitting: 17.3   Smokeless tobacco: Never   Tobacco comments:    Quit in 8/07  Substance Use Topics   Alcohol use: No    Alcohol/week: 0.0 standard drinks   No Known Allergies  Current Meds  Medication Sig   albuterol (VENTOLIN HFA) 108 (90 Base) MCG/ACT inhaler Inhale 2 puffs into the lungs every 6 (six) hours as needed for wheezing or shortness of breath.   amLODipine (NORVASC) 5 MG tablet Take 1 tablet (5 mg total) by mouth daily.   Aspirin 81 MG EC tablet Take 81 mg by mouth daily.   Budeson-Glycopyrrol-Formoterol (BREZTRI AEROSPHERE) 160-9-4.8 MCG/ACT AERO Inhale 2 puffs into the lungs in the morning and at bedtime.   Cholecalciferol (VITAMIN D3) 1.25 MG (50000 UT) TABS    etanercept (ENBREL) 50 MG/ML injection Inject 50 mg into the skin once a week.   hydrochlorothiazide (HYDRODIURIL) 25 MG tablet Take 1 tablet (25 mg total) by mouth daily.   hydroxychloroquine (PLAQUENIL) 200 MG tablet Take 200 mg by mouth daily.   Multiple Vitamin (MULTIVITAMIN) capsule Take 1 capsule by mouth daily.   Immunization History  Administered Date(s) Administered   Fluad Quad(high Dose 65+) 03/08/2019, 04/17/2020   Hep A / Hep B 11/03/2016, 12/06/2016   Hepatitis B, adult 05/09/2017   Influenza Split 04/03/2011   Influenza Whole 04/03/2010   Influenza, High Dose Seasonal PF 04/08/2016, 03/31/2017, 04/21/2018   Influenza,inj,Quad PF,6+ Mos 03/05/2014   Influenza-Unspecified 04/03/2013, 03/05/2014, 05/03/2015, 04/17/2020, 04/08/2021, 04/17/2021   PFIZER Comirnaty(Gray Top)Covid-19 Tri-Sucrose Vaccine 08/15/2019, 09/05/2019, 04/17/2020   PFIZER(Purple Top)SARS-COV-2 Vaccination 08/15/2019, 09/05/2019, 04/17/2020, 01/05/2021   Pneumococcal Conjugate-13 06/10/2016   Pneumococcal Polysaccharide-23 03/28/2007, 10/15/2013, 08/15/2018   Td 06/11/2010       Objective:    Physical Exam BP (!) 142/80 (BP Location: Left Arm, Cuff Size: Normal)   Pulse 63   Temp 97.6 F (36.4 C) (Temporal)   Ht 5\' 6"  (1.676 m)   Wt 104 lb (47.2 kg)   SpO2 92%   BMI 16.79 kg/m  Gen: Cachectic, chronically ill.  Use of accessories, chronic.  No overt tachypnea HEENT: NCAT, sclerae anicteric, dry mucous membranes, no thrush.  No oral lesions. Neck: No JVD, trachea midline, no crepitus Thorax: Increased AP diameter, Hoover's sign present Lungs: breath sounds  distant throughout without wheezes nor other adventitious sounds Cardiovascular: RRR, no murmurs, distant tones Abdomen: Soft, nontender, normal BS Ext: without clubbing, cyanosis.  No pedal edema noted today.  She does have stigmata of rheumatoid arthritis on her hands/wrists. Neuro: grossly intact Skin: Limited exam, no open lesions noted, she does have some ecchymoses/senile purpuras      Assessment & Plan:     ICD-10-CM   1. COPD, very severe (HCC)  J44.9    Continue Breztri and as needed albuterol Appears fairly well compensated    2. Chronic respiratory failure with hypoxia (HCC)  J96.11    Continue oxygen supplementation at 2 L/min Patient compliant with therapy Notes benefit of therapy    3.  Pulmonary hypertension (HCC)  I27.20    Cor pulmonale Continue oxygen Continue COPD management Moderate    4. 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  Gailen Shelter, MD Advanced Bronchoscopy PCCM [facility]    *This note was dictated using voice recognition software/Dragon.  Despite best efforts to proofread, errors can occur which can change the meaning. Any transcriptional errors that result from this process are unintentional and may not be fully corrected at the time  of dictation.

## 2021-10-28 NOTE — Patient Instructions (Signed)
Continue using Breztri 2 puffs twice a day. ? ?Continue oxygen. ? ? ?We will see you in follow-up in 4 months time call sooner should any new problems arise. ?

## 2021-11-10 DIAGNOSIS — K51219 Ulcerative (chronic) proctitis with unspecified complications: Secondary | ICD-10-CM | POA: Diagnosis not present

## 2021-11-10 DIAGNOSIS — M810 Age-related osteoporosis without current pathological fracture: Secondary | ICD-10-CM | POA: Diagnosis not present

## 2021-11-10 DIAGNOSIS — M069 Rheumatoid arthritis, unspecified: Secondary | ICD-10-CM | POA: Diagnosis not present

## 2021-11-10 DIAGNOSIS — M3489 Other systemic sclerosis: Secondary | ICD-10-CM | POA: Diagnosis not present

## 2021-11-16 ENCOUNTER — Ambulatory Visit (INDEPENDENT_AMBULATORY_CARE_PROVIDER_SITE_OTHER): Payer: Medicare HMO | Admitting: *Deleted

## 2021-11-16 DIAGNOSIS — Z0000 Encounter for general adult medical examination without abnormal findings: Secondary | ICD-10-CM | POA: Diagnosis not present

## 2021-11-16 NOTE — Patient Instructions (Signed)
Carrie Hendricks , ?Thank you for taking time to come for your Medicare Wellness Visit. I appreciate your ongoing commitment to your health goals. Please review the following plan we discussed and let me know if I can assist you in the future.  ? ?Screening recommendations/referrals: ?Colonoscopy:  Education provided ?Mammogram: up to date ?Bone Density: up to date ?Recommended yearly ophthalmology/optometry visit for glaucoma screening and checkup ?Recommended yearly dental visit for hygiene and checkup ? ?Vaccinations: ?Influenza vaccine: up to date ?Pneumococcal vaccine: up to date ?Tdap vaccine: Education provided ?Shingles vaccine: Education provided   ? ?Advanced directives: Education provided ? ?Conditions/risks identified:  ? ? ? ? ?Preventive Care 41 Years and Older, Female ?Preventive care refers to lifestyle choices and visits with your health care provider that can promote health and wellness. ?What does preventive care include? ?A yearly physical exam. This is also called an annual well check. ?Dental exams once or twice a year. ?Routine eye exams. Ask your health care provider how often you should have your eyes checked. ?Personal lifestyle choices, including: ?Daily care of your teeth and gums. ?Regular physical activity. ?Eating a healthy diet. ?Avoiding tobacco and drug use. ?Limiting alcohol use. ?Practicing safe sex. ?Taking low-dose aspirin every day. ?Taking vitamin and mineral supplements as recommended by your health care provider. ?What happens during an annual well check? ?The services and screenings done by your health care provider during your annual well check will depend on your age, overall health, lifestyle risk factors, and family history of disease. ?Counseling  ?Your health care provider may ask you questions about your: ?Alcohol use. ?Tobacco use. ?Drug use. ?Emotional well-being. ?Home and relationship well-being. ?Sexual activity. ?Eating habits. ?History of falls. ?Memory and  ability to understand (cognition). ?Work and work Statistician. ?Reproductive health. ?Screening  ?You may have the following tests or measurements: ?Height, weight, and BMI. ?Blood pressure. ?Lipid and cholesterol levels. These may be checked every 5 years, or more frequently if you are over 67 years old. ?Skin check. ?Lung cancer screening. You may have this screening every year starting at age 47 if you have a 30-pack-year history of smoking and currently smoke or have quit within the past 15 years. ?Fecal occult blood test (FOBT) of the stool. You may have this test every year starting at age 59. ?Flexible sigmoidoscopy or colonoscopy. You may have a sigmoidoscopy every 5 years or a colonoscopy every 10 years starting at age 8. ?Hepatitis C blood test. ?Hepatitis B blood test. ?Sexually transmitted disease (STD) testing. ?Diabetes screening. This is done by checking your blood sugar (glucose) after you have not eaten for a while (fasting). You may have this done every 1-3 years. ?Bone density scan. This is done to screen for osteoporosis. You may have this done starting at age 38. ?Mammogram. This may be done every 1-2 years. Talk to your health care provider about how often you should have regular mammograms. ?Talk with your health care provider about your test results, treatment options, and if necessary, the need for more tests. ?Vaccines  ?Your health care provider may recommend certain vaccines, such as: ?Influenza vaccine. This is recommended every year. ?Tetanus, diphtheria, and acellular pertussis (Tdap, Td) vaccine. You may need a Td booster every 10 years. ?Zoster vaccine. You may need this after age 46. ?Pneumococcal 13-valent conjugate (PCV13) vaccine. One dose is recommended after age 9. ?Pneumococcal polysaccharide (PPSV23) vaccine. One dose is recommended after age 35. ?Talk to your health care provider about which screenings and vaccines you  need and how often you need them. ?This information is  not intended to replace advice given to you by your health care provider. Make sure you discuss any questions you have with your health care provider. ?Document Released: 07/17/2015 Document Revised: 03/09/2016 Document Reviewed: 04/21/2015 ?Elsevier Interactive Patient Education ? 2017 [place]. ? ?Fall Prevention in the Home ?Falls can cause injuries. They can happen to people of all ages. There are many things you can do to make your home safe and to help prevent falls. ?What can I do on the outside of my home? ?Regularly fix the edges of walkways and driveways and fix any cracks. ?Remove anything that might make you trip as you walk through a door, such as a raised step or threshold. ?Trim any bushes or trees on the path to your home. ?Use bright outdoor lighting. ?Clear any walking paths of anything that might make someone trip, such as rocks or tools. ?Regularly check to see if handrails are loose or broken. Make sure that both sides of any steps have handrails. ?Any raised decks and porches should have guardrails on the edges. ?Have any leaves, snow, or ice cleared regularly. ?Use sand or salt on walking paths during winter. ?Clean up any spills in your garage right away. This includes oil or grease spills. ?What can I do in the bathroom? ?Use night lights. ?Install grab bars by the toilet and in the tub and shower. Do not use towel bars as grab bars. ?Use non-skid mats or decals in the tub or shower. ?If you need to sit down in the shower, use a plastic, non-slip stool. ?Keep the floor dry. Clean up any water that spills on the floor as soon as it happens. ?Remove soap buildup in the tub or shower regularly. ?Attach bath mats securely with double-sided non-slip rug tape. ?Do not have throw rugs and other things on the floor that can make you trip. ?What can I do in the bedroom? ?Use night lights. ?Make sure that you have a light by your bed that is easy to reach. ?Do not use any sheets or blankets that  are too big for your bed. They should not hang down onto the floor. ?Have a firm chair that has side arms. You can use this for support while you get dressed. ?Do not have throw rugs and other things on the floor that can make you trip. ?What can I do in the kitchen? ?Clean up any spills right away. ?Avoid walking on wet floors. ?Keep items that you use a lot in easy-to-reach places. ?If you need to reach something above you, use a strong step stool that has a grab bar. ?Keep electrical cords out of the way. ?Do not use floor polish or wax that makes floors slippery. If you must use wax, use non-skid floor wax. ?Do not have throw rugs and other things on the floor that can make you trip. ?What can I do with my stairs? ?Do not leave any items on the stairs. ?Make sure that there are handrails on both sides of the stairs and use them. Fix handrails that are broken or loose. Make sure that handrails are as long as the stairways. ?Check any carpeting to make sure that it is firmly attached to the stairs. Fix any carpet that is loose or worn. ?Avoid having throw rugs at the top or bottom of the stairs. If you do have throw rugs, attach them to the floor with carpet tape. ?Make sure that  you have a light switch at the top of the stairs and the bottom of the stairs. If you do not have them, ask someone to add them for you. ?What else can I do to help prevent falls? ?Wear shoes that: ?Do not have high heels. ?Have rubber bottoms. ?Are comfortable and fit you well. ?Are closed at the toe. Do not wear sandals. ?If you use a stepladder: ?Make sure that it is fully opened. Do not climb a closed stepladder. ?Make sure that both sides of the stepladder are locked into place. ?Ask someone to hold it for you, if possible. ?Clearly mark and make sure that you can see: ?Any grab bars or handrails. ?First and last steps. ?Where the edge of each step is. ?Use tools that help you move around (mobility aids) if they are needed. These  include: ?Canes. ?Walkers. ?Scooters. ?Crutches. ?Turn on the lights when you go into a dark area. Replace any light bulbs as soon as they burn out. ?Set up your furniture so you have a clear path. Avoid movin

## 2021-11-16 NOTE — Progress Notes (Cosign Needed)
? ?Subjective:  ? Carrie Hendricks is a 71 y.o. female who presents for Medicare Annual (Subsequent) preventive examination. ? ?I connected with  [name] on 11/16/21 by a telephone enabled telemedicine application and verified that I am speaking with the correct person using two identifiers. ?  ?I discussed the limitations of evaluation and management by telemedicine. The patient expressed understanding and agreed to proceed. ? ?Patient location: home ? ?Provider location: Tele-Health-home ? ? ? ?Review of Systems    ? ?Cardiac Risk Factors include: advanced age (>41mn, >>90women);hypertension ? ?   ?Objective:  ?  ?Today's Vitals  ? 11/16/21 1105  ?PainSc: 8   ? ?There is no height or weight on file to calculate BMI. ? ? ?  11/16/2021  ? 11:06 AM 08/31/2020  ?  3:51 PM 06/21/2019  ?  1:45 PM 06/14/2019  ?  2:35 PM 08/14/2018  ? 12:30 PM 12/08/2017  ?  8:21 AM 09/30/2016  ?  8:49 AM  ?Advanced Directives  ?Does Patient Have a Medical Advance Directive? No No No No No No No  ?Would patient like information on creating a medical advance directive? No - Patient declined No - Patient declined No - Patient declined No - Patient declined No - Patient declined Yes (MAU/Ambulatory/Procedural Areas - Information given) No - Patient declined  ? ? ?Current Medications (verified) ?Outpatient Encounter Medications as of 11/16/2021  ?Medication Sig  ? albuterol (VENTOLIN HFA) 108 (90 Base) MCG/ACT inhaler Inhale 2 puffs into the lungs every 6 (six) hours as needed for wheezing or shortness of breath.  ? amLODipine (NORVASC) 5 MG tablet Take 1 tablet (5 mg total) by mouth daily.  ? Aspirin 81 MG EC tablet Take 81 mg by mouth daily.  ? Budeson-Glycopyrrol-Formoterol (BREZTRI AEROSPHERE) 160-9-4.8 MCG/ACT AERO Inhale 2 puffs into the lungs in the morning and at bedtime.  ? Cholecalciferol (VITAMIN D3) 1.25 MG (50000 UT) TABS   ? etanercept (ENBREL) 50 MG/ML injection Inject 50 mg into the skin once a week.  ?  hydrochlorothiazide (HYDRODIURIL) 25 MG tablet Take 1 tablet (25 mg total) by mouth daily.  ? hydroxychloroquine (PLAQUENIL) 200 MG tablet Take 200 mg by mouth daily.  ? Multiple Vitamin (MULTIVITAMIN) capsule Take 1 capsule by mouth daily.  ? sulfaSALAzine (AZULFIDINE) 500 MG tablet Take 1 tablet (500 mg total) by mouth 4 (four) times daily. (Patient not taking: Reported on 01/28/2021)  ? ?No facility-administered encounter medications on file as of 11/16/2021.  ? ? ?Allergies (verified) ?Patient has no known allergies.  ? ?History: ?Past Medical History:  ?Diagnosis Date  ? Arthritis   ? rheumatoid  ? Complication of anesthesia   ? Hypertension   ? Osteoporosis   ? PONV (postoperative nausea and vomiting)   ? Pulmonary hypertension ([place]   ? Rheumatoid arthritis(714.0)   ? ? scleroderma  ? ?Past Surgical History:  ?Procedure Laterality Date  ? colonosc neg  11/08  ? COLONOSCOPY WITH PROPOFOL N/A 09/30/2016  ? Procedure: COLONOSCOPY WITH PROPOFOL;  Surgeon: KJonathon Bellows MD;  [facility];  Service: Endoscopy;  Laterality: N/A;  ? COLONOSCOPY WITH PROPOFOL N/A 12/08/2017  ? Procedure: COLONOSCOPY WITH PROPOFOL;  Surgeon: AJonathon Bellows MD;  [facility];  Service: Gastroenterology;  Laterality: N/A;  ? Dexa  07/2005  ? OP hip- 3.0  ? ESOPHAGOGASTRODUODENOSCOPY (EGD) WITH PROPOFOL N/A 07/01/2016  ? Procedure: ESOPHAGOGASTRODUODENOSCOPY (EGD) WITH PROPOFOL;  Surgeon: KJonathon Bellows MD;  Location: [facility] ENDOSCOPY;  Service: Endoscopy;  Laterality:  N/A;  ? FLEXIBLE SIGMOIDOSCOPY N/A 07/01/2016  ? Procedure: FLEXIBLE SIGMOIDOSCOPY;  Surgeon: Jonathon Bellows, MD;  [facility];  Service: Endoscopy;  Laterality: N/A;  ? LEEP  2005  ? Surgery- tendon release in hand  2006  ? TUBAL LIGATION    ? ?Family History  ?Problem Relation Age of Onset  ? Heart disease Mother   ? Diabetes Mother   ? Hypertension Father   ? Macular degeneration Father   ? Dementia Father   ?     early  ? Arthritis Father   ?     RA  ?  Alcohol abuse Father   ? Breast cancer Sister 74  ? ?Social History  ? ?Socioeconomic History  ? Marital status: Single  ?  Spouse name: Not on file  ? Number of children: 3  ? Years of education: Not on file  ? Highest education level: Not on file  ?Occupational History  ?  Employer: [employer]  ?Tobacco Use  ? Smoking status: Former  ?  Packs/day: 1.25  ?  Years: 30.00  ?  Pack years: 37.50  ?  Types: Cigarettes  ?  Quit date: 2006  ?  Years since quitting: 17.3  ? Smokeless tobacco: Never  ? Tobacco comments:  ?  Quit in 8/07  ?Vaping Use  ? Vaping Use: Never used  ?Substance and Sexual Activity  ? Alcohol use: No  ?  Alcohol/week: 0.0 standard drinks  ? Drug use: No  ? Sexual activity: Not Currently  ?Other Topics Concern  ? Not on file  ?Social History Narrative  ? 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   ? ?Social Determinants of Health  ? ?Financial Resource Strain: Low Risk   ? Difficulty of Paying Living Expenses: Not hard at all  ?Food Insecurity: No Food Insecurity  ? Worried About Charity fundraiser in the Last Year: Never true  ? Ran Out of Food in the Last Year: Never true  ?Transportation Needs: No Transportation Needs  ? Lack of Transportation (Medical): No  ? Lack of Transportation (Non-Medical): No  ?Physical Activity: Sufficiently Active  ? Days of Exercise per Week: 4 days  ? Minutes of Exercise per Session: 40 min  ?Stress: No Stress Concern Present  ? Feeling of Stress : Only a little  ?Social Connections: Unknown  ? Frequency of Communication with Friends and Family: More than three times a week  ? Frequency of Social Gatherings with Friends and Family: Twice a week  ? Attends Religious Services: More than 4 times per year  ? Active Member of Clubs or Organizations: Yes  ? Attends Archivist Meetings: More than 4 times per year  ? Marital Status: Not on file  ? ? ?Tobacco Counseling ?Counseling given: Not Answered ?Tobacco comments: Quit in 8/07 ? ? ?Clinical  Intake: ? ?Pre-visit preparation completed: Yes ? ?Pain : 0-10 ?Pain Score: 8  ?Pain Type: Chronic pain ?Pain Location:  (rhematoid athritis) ?Pain Onset: More than a month ago ?Pain Frequency: Constant ? ?  ? ?Nutritional Risks: None ?Diabetes: No ? ?How often do you need to have someone help you when you read instructions, pamphlets, or other written materials from your doctor or pharmacy?: 1 - Never ? ?Diabetic?  no ? ?Interpreter Needed?: No ? ?Information entered by :: Leroy Kennedy LPN ? ? ?Activities of Daily Living ? ?  11/16/2021  ? 11:11 AM  ?In your present state  of health, do you have any difficulty performing the following activities:  ?Hearing? 0  ?Vision? 0  ?Difficulty concentrating or making decisions? 0  ?Walking or climbing stairs? 1  ?Comment sob  ?Dressing or bathing? 0  ?Doing errands, shopping? 0  ?Preparing Food and eating ? N  ?Using the Toilet? N  ?In the past six months, have you accidently leaked urine? N  ?Do you have problems with loss of bowel control? N  ?Managing your Medications? N  ?Managing your Finances? N  ?Housekeeping or managing your Housekeeping? N  ? ? ?Patient Care Team: ?Debbora Dus, [facility] as Pharmacist (Pharmacist) ?Jonathon Bellows, MD as Consulting Physician (Gastroenterology) ? ?Indicate any recent Medical Services you may have received from other than [facility] providers in the past year (date may be approximate). ? ?   ?Assessment:  ? This is a routine wellness examination for [facility]. ? ?Hearing/Vision screen ?Hearing Screening - Comments:: No trouble hearing  ?Vision Screening - Comments:: Up to date ?Kerin Ransom ?[facility]   Dr. Edison Pace  ? ?Dietary issues and exercise activities discussed: ?Current Exercise Habits: Structured exercise class, Type of exercise: strength training/weights;stretching (bicycle push out with feet), Time (Minutes): 40, Frequency (Times/Week): 4, Weekly Exercise (Minutes/Week): 160, Intensity: Mild, Exercise limited by: respiratory conditions(s);Other -  see comments ? ? Goals Addressed   ? ?  ?  ?  ?  ? This Visit's Progress  ?  Patient Stated     ?  Would like to improve health ?  ? ?  ? ?Depression Screen ? ?  11/16/2021  ? 11:14 AM 11/26/2020  ?  5:55 PM 11/04/2020

## 2021-11-22 ENCOUNTER — Other Ambulatory Visit: Payer: Self-pay

## 2021-11-22 ENCOUNTER — Ambulatory Visit: Payer: Medicare HMO | Admitting: Gastroenterology

## 2021-11-22 ENCOUNTER — Encounter: Payer: Self-pay | Admitting: Gastroenterology

## 2021-11-22 VITALS — BP 150/77 | HR 92 | Temp 97.3°F | Wt 103.6 lb

## 2021-11-22 DIAGNOSIS — K5190 Ulcerative colitis, unspecified, without complications: Secondary | ICD-10-CM

## 2021-11-22 NOTE — Patient Instructions (Signed)
Charcoal tablets or capsules What is this medication? CHARCOAL (CHAR kole) is a dietary supplement. It is used to absorb gases in the stomach that cause stomach gas. Do not use this supplement to treat poisonings or overdose. The FDA has not approved this supplement for any medical use. This medicine may be used for other purposes; ask your health care provider or pharmacist if you have questions. COMMON BRAND NAME(S): Charcoal Plus DS, CharcoCap, CharcoCaps Anti-Gas What should I tell my care team before I take this medication? They need to know if you have any of these conditions: food or medicine poisoning have frequent heartburn or gas have recently traveled to another country stomach or intestinal disease an unusual or allergic reaction to charcoal, other medicines, foods, dyes, or preservatives pregnant or trying to get pregnant breast-feeding How should I use this medication? Take by mouth with a glass of water. Follow the directions on the package label or use as directed by a health care provider. Do not take this medicine more often than directed. Talk to your pediatrician regarding the use of this medicine in children. Special care may be needed. Overdosage: If you think you have taken too much of this medicine contact a poison control center or emergency room at once. NOTE: This medicine is only for you. Do not share this medicine with others. What if I miss a dose? If you miss a dose, take it as soon as you can. If it is almost time for your next dose, take only that dose. Do not take double or extra doses. What may interact with this medication? Do not take this medicine with any of the following medications: ipecac This medicine may also interact with the following medications: acarbose aripiprazole birth control pills carbamazepine dapsone digoxin olanzapine phenothiazines like chlorpromazine, mesoridazine, prochlorperazine, thioridazine phenytoin pindolol some  herbal medicines or dietary supplements theophylline ursodeoxycholic acid This list may not describe all possible interactions. Give your health care provider a list of all the medicines, herbs, non-prescription drugs, or dietary supplements you use. Also tell them if you smoke, drink alcohol, or use illegal drugs. Some items may interact with your medicine. What should I watch for while using this medication? Tell your doctor or healthcare professional if your symptoms do not start to get better or if they get worse. See your doctor if your symptoms last for 3 days. Do not use this medicine to treat a poisoning or overdose. Get emergency help. This medicine may bind to other medicines or dairy products in the stomach. Do not take any other medicines for at least 2 hours before or after taking this medicine. Do not eat or drink milk, cheese, or other diary for at least 2 hours before or after taking this medicine. Herbal or dietary supplements are not regulated like medicines. Rigid quality control standards are not required for dietary supplements. The purity and strength of these products can vary. The safety and effect of this dietary supplement for a certain disease or illness is not well known. This product is not intended to diagnose, treat, cure or prevent any disease. The Food and Drug Administration suggests the following to help consumers protect themselves: Always read product labels and follow directions. Natural does not mean a product is safe for humans to take. Look for products that include USP after the ingredient name. This means that the manufacturer followed the standards of the [place] Pharmacopoeia. Supplements made or sold by a nationally known food or drug company are  more likely to be made under tight controls. You can write to the company for more information about how the product was made. What side effects may I notice from receiving this medication? Side effects that you should  report to your doctor or health care professional as soon as possible: allergic reactions like skin rash, itching or hives, swelling of the face, lips, or tongue Side effects that usually do not require medical attention (report to your doctor or health care professional if they continue or are bothersome): constipation dark stools dark tongue diarrhea or vomiting This list may not describe all possible side effects. Call your doctor for medical advice about side effects. You may report side effects to FDA at 1-800-FDA-1088. Where should I keep my medication? Keep out of the reach of children. Store at room temperature between 15 and 30 degrees C (59 and 86 degrees F). Protect from heat and moisture. Keep tightly closed. Throw away any unused medicine after the expiration date. NOTE: This sheet is a summary. It may not cover all possible information. If you have questions about this medicine, talk to your doctor, pharmacist, or health care provider.  2023 Elsevier/Gold Standard (2019-10-31 00:00:00)

## 2021-11-22 NOTE — Progress Notes (Signed)
Jonathon Bellows MD, MRCP(U.K) [address]  [place]  [place], [place] [address]  Main: [phone]  Fax: [phone]   Primary Care Physician: No primary care provider on file.  Primary Gastroenterologist:  Dr. Jonathon Bellows   Chief Complaint  Patient presents with   Ulcerative Colitis    [name] is a 71 y.o. female  Summary of history :   Is here to follow-up for ulcerative colitis pan colitis since 06/2016 .   Was seen and evaluated by Dr. 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   EGD 06/2016: biopsies showed active chronic gastritis with H pylori , marked duodenitis , normal esophageal biopsies. H pylori stool antigen is negative 08/16/16    Colonoscopy 06/2016 : unable to perform colonoscopy as she had severe colitis on the left side of the colon - we had to abort the procedure at the 30 cm mark.Biopsies confirmed  Active colitis with chronicity    Colonoscopy :  3/3/0/18 and bx of the terminal ileum was normal but biopsies of the ascending colon , transverse , descending, sigmoid and rectum showed some areas of inflammation and areas with chronic inflammation changes.    Colonoscopy 12/2017 -single adenoma resected . Colon bx throughout the colon showed no active colitis.    07/2016: DEXA scan normal immune to hepatitis a and B.   Interval history 11/17/2020-11/22/2021     Uptodate with Hepatitis, Pneumococcal vaccine,  and flu shot   Has been on sulfasalazine 100 mg 4 times daily for some years   She is doing well.  1 bowel movement today.  No blood in the stools.  No cramping.  No nocturnal symptoms.  No abdominal pain.  No other acute issues. 09/21/2021: Hb 14.8 grams, LFT's normal , Cr 0.6    Current Outpatient Medications  Medication Sig Dispense Refill   albuterol (VENTOLIN HFA) 108 (90 Base) MCG/ACT inhaler Inhale 2 puffs into the lungs every 6 (six) hours as needed for wheezing or shortness of breath. 54 g 0    amLODipine (NORVASC) 5 MG tablet Take 1 tablet (5 mg total) by mouth daily. 90 tablet 3   Aspirin 81 MG EC tablet Take 81 mg by mouth daily.     Budeson-Glycopyrrol-Formoterol (BREZTRI AEROSPHERE) 160-9-4.8 MCG/ACT AERO Inhale 2 puffs into the lungs in the morning and at bedtime. 10.7 g 11   Cholecalciferol (VITAMIN D3) 1.25 MG (50000 UT) TABS      etanercept (ENBREL) 50 MG/ML injection Inject 50 mg into the skin once a week.     hydrochlorothiazide (HYDRODIURIL) 25 MG tablet Take 1 tablet (25 mg total) by mouth daily. 90 tablet 3   hydroxychloroquine (PLAQUENIL) 200 MG tablet Take 200 mg by mouth daily.     Multiple Vitamin (MULTIVITAMIN) capsule Take 1 capsule by mouth daily.     PROLIA 60 MG/ML SOSY injection Inject into the skin.     sulfaSALAzine (AZULFIDINE) 500 MG tablet Take 1 tablet (500 mg total) by mouth 4 (four) times daily. 360 tablet 3   No current facility-administered medications for this visit.    Allergies as of 11/22/2021   (No Known Allergies)    ROS:  General: Negative for anorexia, weight loss, fever, chills, fatigue, weakness. ENT: Negative for hoarseness, difficulty swallowing , nasal congestion. CV: Negative for chest pain, angina, palpitations, dyspnea on exertion, peripheral edema.  Respiratory: Negative for dyspnea at rest, dyspnea on exertion, cough, sputum, wheezing.  GI: See  history of present illness. GU:  Negative for dysuria, hematuria, urinary incontinence, urinary frequency, nocturnal urination.  Endo: Negative for unusual weight change.    Physical Examination:   BP (!) 150/77   Pulse 92   Temp (!) 97.3 F (36.3 C) (Oral)   Wt 103 lb 9.6 oz (47 kg)   BMI 16.72 kg/m   General: Well-nourished, well-developed in no acute distress.  Eyes: No icterus. Conjunctivae pink. Neuro: Alert and oriented x 3.  Grossly intact. Skin: Warm and dry, no jaundice.   Psych: Alert and cooperative, normal mood and affect.   Imaging Studies: No results  found.  Assessment and Plan:   Carrie Hendricks is a 71 y.o. y/o female   here to follow up for Ulcerative colitis: .  Clinically and biochemically in remission.  Presently on Fosamax, Enbrel prescribed by her rheumatologist.  Up-to-date with her vaccination.   Plan 1.  CBC,CMP every 4-5 months with PCP to monitor since on sulfasalazine and occasionally can cause agranulocytosis.    Dr Jonathon Bellows  MD,MRCP [facility]) Follow up in 8 months

## 2021-11-24 DIAGNOSIS — K51219 Ulcerative (chronic) proctitis with unspecified complications: Secondary | ICD-10-CM | POA: Diagnosis not present

## 2021-11-24 DIAGNOSIS — M069 Rheumatoid arthritis, unspecified: Secondary | ICD-10-CM | POA: Diagnosis not present

## 2021-11-24 DIAGNOSIS — M3489 Other systemic sclerosis: Secondary | ICD-10-CM | POA: Diagnosis not present

## 2021-11-24 DIAGNOSIS — M810 Age-related osteoporosis without current pathological fracture: Secondary | ICD-10-CM | POA: Diagnosis not present

## 2021-12-08 ENCOUNTER — Telehealth: Payer: Self-pay | Admitting: Pulmonary Disease

## 2021-12-08 ENCOUNTER — Other Ambulatory Visit: Payer: Self-pay | Admitting: Pulmonary Disease

## 2021-12-08 DIAGNOSIS — J449 Chronic obstructive pulmonary disease, unspecified: Secondary | ICD-10-CM

## 2021-12-08 NOTE — Addendum Note (Signed)
Addended by: Claudette Head A on: 12/08/2021 03:15 PM   Modules accepted: Orders

## 2021-12-08 NOTE — Telephone Encounter (Signed)
Spoke to patient. She stated that [facility] is no longer in network with [facility]. Preferred is [facility].  order placed to [facility]. Received verbal from Dr. Patsey Berthold.  Nothing further needed.

## 2021-12-08 NOTE — Telephone Encounter (Signed)
Noted.  Order placed to [facility] has bene canceled. Please refer to duplicate phone note dated as 12/08/2021

## 2021-12-23 DIAGNOSIS — J449 Chronic obstructive pulmonary disease, unspecified: Secondary | ICD-10-CM | POA: Diagnosis not present

## 2021-12-23 DIAGNOSIS — Z0000 Encounter for general adult medical examination without abnormal findings: Secondary | ICD-10-CM | POA: Diagnosis not present

## 2021-12-23 DIAGNOSIS — K51919 Ulcerative colitis, unspecified with unspecified complications: Secondary | ICD-10-CM | POA: Diagnosis not present

## 2021-12-23 DIAGNOSIS — Z1389 Encounter for screening for other disorder: Secondary | ICD-10-CM | POA: Diagnosis not present

## 2021-12-23 DIAGNOSIS — M069 Rheumatoid arthritis, unspecified: Secondary | ICD-10-CM | POA: Diagnosis not present

## 2021-12-23 DIAGNOSIS — J9610 Chronic respiratory failure, unspecified whether with hypoxia or hypercapnia: Secondary | ICD-10-CM | POA: Diagnosis not present

## 2022-01-11 ENCOUNTER — Telehealth: Payer: Self-pay

## 2022-01-11 NOTE — Progress Notes (Signed)
Chronic Care Management Pharmacy Assistant   Name: Carrie Hendricks  MRN: [id number] DOB: Dec 13, 1950  Reason for Encounter: CCM (COPD Disease State)   Recent office visits:  11/16/21 AWV 11/16/21 Loura Pardon, MD Adult Medical Exam No other info 09/03/21 Loura Pardon, MD Ritta Slot Start: Nystatin 100000 Unit FU 10 days  Recent consult visits:  11/24/21 Shamil Morayati (Endo) Osteoporosis No other info 11/22/21 Jonathon Bellows, MD Gertie Fey) Ulcerative colitis FU 8 months 11/10/21 Shamil Morayati (Endo) Osteoporosis No other info 10/28/21 Vernard Gambles, MD (Pulmonary) Stop (patient): FOSAMAX 70 MG Stop (patient) MYCOSTATIN 100000 UNIT FU 4 months 09/28/21 Coralie Carpen (Rheumatology) Osteoporosis No other information 09/28/21 Mee Hives (Endocrinology) Osteoporosis Procedures: Bone Density 09/21/21 Coralie Carpen, PA (Radiology) Rheumatoid arthritis Procedures: CHG X-RAY HAND and FOOT 09/01/21 Benay Pillow (Ophthalmology) Start: PreserVision 07/22/21 Vernard Gambles, MD (Pulmonary) Sicca Stop: DIFLUCAN 100 MG Sjogren's test was negative. FU 3 months  Hospital visits:  None in previous 6 months  Medications: Outpatient Encounter Medications as of 01/11/2022  Medication Sig   albuterol (VENTOLIN HFA) 108 (90 Base) MCG/ACT inhaler INHALE 2 PUFFS INTO THE LUNGS EVERY 6 HOURS AS NEEDED FOR WHEEZING OR SHORTNESS OF BREATH.   amLODipine (NORVASC) 5 MG tablet Take 1 tablet (5 mg total) by mouth daily.   Aspirin 81 MG EC tablet Take 81 mg by mouth daily.   Budeson-Glycopyrrol-Formoterol (BREZTRI AEROSPHERE) 160-9-4.8 MCG/ACT AERO Inhale 2 puffs into the lungs in the morning and at bedtime.   Cholecalciferol (VITAMIN D3) 1.25 MG (50000 UT) TABS    etanercept (ENBREL) 50 MG/ML injection Inject 50 mg into the skin once a week.   hydrochlorothiazide (HYDRODIURIL) 25 MG tablet Take 1 tablet (25 mg total) by mouth daily.   hydroxychloroquine (PLAQUENIL) 200 MG tablet Take 200 mg by mouth daily.    Multiple Vitamin (MULTIVITAMIN) capsule Take 1 capsule by mouth daily.   PROLIA 60 MG/ML SOSY injection Inject into the skin.   sulfaSALAzine (AZULFIDINE) 500 MG tablet Take 1 tablet (500 mg total) by mouth 4 (four) times daily.   No facility-administered encounter medications on file as of 01/11/2022.   Contacted patient to discuss COPD disease state Current COPD regimen:  Bretztri - Inhale 2 puffs twice daily Albuterol - 2 puffs every 6 hours for wheezing, shortness of breath  Any recent hospitalizations or ED visits since last visit with CPP? No  denies COPD symptoms, including Increased shortness of breath , Rescue medicine is not helping, Shortness of breath at rest, Symptoms worse with exercise, Symptoms worse at night, and Wheezing   What recent interventions/DTPs have been made by any provider to improve breathing since last visit:  Continue Breztri 2 puffs twice a day  Continue Albuterol as needed.    Have you had exacerbation/flare-up since last visit? No  What do you do when you are short of breath?  Rest  Current tobacco use? Patient quit smoking 27 years ago  Respiratory Devices/Equipment Do you have a nebulizer? Yes but patient no longer uses it per her provider Do you use a Peak Flow Meter? No Do you use a maintenance inhaler? Yes - Breztri How often do you forget to use your daily inhaler? Never Do you use a rescue inhaler? Yes - Albuiterol How often do you use your rescue inhaler?   less than 1x a month Do you use a spacer with your inhaler? Yes  Adherence Review: Does the patient have >5 day gap between last estimated fill date for maintenance inhaler medications?  Patient is approved for Breztri until 07/03/2022.   Pulmonology appointment with on 02/16/22  Patient stated she is no longer using Dr. [name] as her PCP; patient has transferred to Dr. Maretta Los for her care.   Care Gaps: Annual wellness visit in last year? Yes 11/16/21 Most Recent BP reading:  150/77 on 11/22/21  Star Rated Drugs Medication Name  Last Fill Date  Day supply No star rating drugs noted  Charlene Brooke, CPP notified  Marijean Niemann, [place] Pharmacy Assistant [phone]

## 2022-01-19 DIAGNOSIS — K51919 Ulcerative colitis, unspecified with unspecified complications: Secondary | ICD-10-CM | POA: Diagnosis not present

## 2022-01-19 DIAGNOSIS — M349 Systemic sclerosis, unspecified: Secondary | ICD-10-CM | POA: Diagnosis not present

## 2022-01-19 DIAGNOSIS — M0579 Rheumatoid arthritis with rheumatoid factor of multiple sites without organ or systems involvement: Secondary | ICD-10-CM | POA: Diagnosis not present

## 2022-01-19 DIAGNOSIS — I7300 Raynaud's syndrome without gangrene: Secondary | ICD-10-CM | POA: Diagnosis not present

## 2022-01-19 DIAGNOSIS — J431 Panlobular emphysema: Secondary | ICD-10-CM | POA: Diagnosis not present

## 2022-01-27 ENCOUNTER — Telehealth: Payer: Self-pay | Admitting: Gastroenterology

## 2022-01-27 NOTE — Telephone Encounter (Signed)
Patient requesting call back from Dr Vicente Males or his nurse, has some medical questions.

## 2022-01-28 NOTE — Telephone Encounter (Signed)
Patient called stating that she had been having some difficulty having bowel movements for the past week. She stated that she started to take Miralax once a day. She stated that today she finally had a decent bowel movement but that she would like a recommendation as what else she could try to get cleaned out since she feels that she is still constipated. Patient wanted to know if she could get prescribed a laxative so she could get cleaned out. Please advise.

## 2022-01-31 DIAGNOSIS — B028 Zoster with other complications: Secondary | ICD-10-CM | POA: Diagnosis not present

## 2022-02-01 ENCOUNTER — Telehealth: Payer: Self-pay | Admitting: Gastroenterology

## 2022-02-01 NOTE — Telephone Encounter (Signed)
Patient calling requesting medical advise.  Please return call.

## 2022-02-02 NOTE — Telephone Encounter (Signed)
Patient stated that she is having some abdominal bloating and constipation for the past 3 days. She stated that she had stopped taking Lizness because she was not feeling well, therefore, she stopped taking it. However, yesterday she went to see her primary care doctor and told her that she had shingles. She was given medication for it but still having symptoms. I asked her to restart taking Linzess again and see if that will help her with her symptoms. She stated that she would start taking Linzess again and if she didn't feel any better by Friday, to please give [place] a call so we could ask Dr. Vicente Males for another recommendation.

## 2022-02-16 ENCOUNTER — Ambulatory Visit: Payer: Medicare HMO | Admitting: Pulmonary Disease

## 2022-02-16 DIAGNOSIS — B028 Zoster with other complications: Secondary | ICD-10-CM | POA: Diagnosis not present

## 2022-02-18 ENCOUNTER — Telehealth: Payer: Self-pay | Admitting: Pulmonary Disease

## 2022-02-18 NOTE — Telephone Encounter (Signed)
Order placed to [facility] 12/08/2021. Carrie Hendricks, can you follow up on this? Thanks

## 2022-02-21 NOTE — Telephone Encounter (Signed)
Urgent message sent to [facility] to make sure Carrie Hendricks with [place] knows what is happening with the 02 transfer  patients from [place]

## 2022-02-23 DIAGNOSIS — B028 Zoster with other complications: Secondary | ICD-10-CM | POA: Diagnosis not present

## 2022-02-24 DIAGNOSIS — L570 Actinic keratosis: Secondary | ICD-10-CM | POA: Diagnosis not present

## 2022-02-24 DIAGNOSIS — D2272 Melanocytic nevi of left lower limb, including hip: Secondary | ICD-10-CM | POA: Diagnosis not present

## 2022-02-24 DIAGNOSIS — D2261 Melanocytic nevi of right upper limb, including shoulder: Secondary | ICD-10-CM | POA: Diagnosis not present

## 2022-02-24 DIAGNOSIS — X32XXXA Exposure to sunlight, initial encounter: Secondary | ICD-10-CM | POA: Diagnosis not present

## 2022-02-24 DIAGNOSIS — D2262 Melanocytic nevi of left upper limb, including shoulder: Secondary | ICD-10-CM | POA: Diagnosis not present

## 2022-02-24 DIAGNOSIS — Z85828 Personal history of other malignant neoplasm of skin: Secondary | ICD-10-CM | POA: Diagnosis not present

## 2022-03-02 DIAGNOSIS — Z0100 Encounter for examination of eyes and vision without abnormal findings: Secondary | ICD-10-CM | POA: Diagnosis not present

## 2022-03-02 DIAGNOSIS — H353132 Nonexudative age-related macular degeneration, bilateral, intermediate dry stage: Secondary | ICD-10-CM | POA: Diagnosis not present

## 2022-03-26 ENCOUNTER — Other Ambulatory Visit: Payer: Self-pay | Admitting: Gastroenterology

## 2022-03-30 ENCOUNTER — Telehealth: Payer: Self-pay | Admitting: Gastroenterology

## 2022-03-30 NOTE — Telephone Encounter (Signed)
Pt called needing prescription called in for  Sulsasazine 500 mg phar- [phone]

## 2022-04-01 NOTE — Telephone Encounter (Signed)
Dr. Vicente Males approved her prescription and he sent it in to [facility].

## 2022-04-06 ENCOUNTER — Other Ambulatory Visit: Payer: Self-pay | Admitting: Internal Medicine

## 2022-04-06 ENCOUNTER — Other Ambulatory Visit: Payer: Self-pay | Admitting: Family Medicine

## 2022-04-06 DIAGNOSIS — Z1231 Encounter for screening mammogram for malignant neoplasm of breast: Secondary | ICD-10-CM

## 2022-04-08 ENCOUNTER — Encounter: Payer: Self-pay | Admitting: Pulmonary Disease

## 2022-04-08 ENCOUNTER — Ambulatory Visit: Payer: Medicare HMO | Admitting: Pulmonary Disease

## 2022-04-08 ENCOUNTER — Telehealth: Payer: Self-pay

## 2022-04-08 VITALS — BP 128/80 | HR 76 | Temp 97.6°F | Ht 66.0 in | Wt 102.4 lb

## 2022-04-08 DIAGNOSIS — J449 Chronic obstructive pulmonary disease, unspecified: Secondary | ICD-10-CM

## 2022-04-08 DIAGNOSIS — M3500 Sicca syndrome, unspecified: Secondary | ICD-10-CM

## 2022-04-08 DIAGNOSIS — Z87891 Personal history of nicotine dependence: Secondary | ICD-10-CM | POA: Diagnosis not present

## 2022-04-08 DIAGNOSIS — I272 Pulmonary hypertension, unspecified: Secondary | ICD-10-CM | POA: Diagnosis not present

## 2022-04-08 DIAGNOSIS — R64 Cachexia: Secondary | ICD-10-CM | POA: Diagnosis not present

## 2022-04-08 DIAGNOSIS — M0569 Rheumatoid arthritis of multiple sites with involvement of other organs and systems: Secondary | ICD-10-CM

## 2022-04-08 DIAGNOSIS — J9611 Chronic respiratory failure with hypoxia: Secondary | ICD-10-CM | POA: Diagnosis not present

## 2022-04-08 DIAGNOSIS — B370 Candidal stomatitis: Secondary | ICD-10-CM

## 2022-04-08 MED ORDER — FLUCONAZOLE 100 MG PO TABS: 100.0000 mg | ORAL_TABLET | Freq: Every day | ORAL | 0 refills | Status: AC

## 2022-04-08 NOTE — Progress Notes (Signed)
Subjective:    Patient ID: Carrie Hendricks, female    DOB: 06-11-1951, 71 y.o.   MRN: [id number] Patient Care Team: Enid Baas, MD as PCP - General (Internal Medicine) Phil Dopp, [facility] as Pharmacist (Pharmacist) [name], MD as Consulting Physician (Gastroenterology) Salena Saner, MD as Consulting Physician (Pulmonary Disease)  Chief Complaint  Patient presents with   Follow-up    COPD. No SOB, wheezing or cough. 2L O2 constant.     HPI Carrie Hendricks presents for follow-up.  We last saw her on 28 October 2021.  As noted she is a 71 year old former smoker with very severe COPD and chronic hypoxic respiratory failure.  She has moderate pulmonary hypertension.  Pulmonary hypertension has improved with supplemental oxygen.  She is compliant with Breztri therapy and with supplemental oxygen.  She has not had recent exacerbations.  Her only issue is persistent issues with recurrent thrush with the [facility] even with very meticulous rinsing after use.  This interrupts her therapy because she is unable to use the [place] during the times of thrush.  She has not had any fevers, chills or sweats.  No cough over her usual, sputum has not changed in character.  No hemoptysis.  Continues to have "good days and bad days" she is compliant with her oxygen therapy at 2 L/min continuous or 3 L intermittent.  She is unable to use Breztri she has to recur to albuterol use sometimes 4-5 times a day.   Recall that she has pretty significant rheumatoid arthritis.  She also has issues with sicca syndrome.  I suspect that this is the driver for her recurrent thrush.  She continues to be abstinent of cigarettes.  She does not endorse any other symptomatology.  Her joint pain is well controlled.  She is on Plaquenil, she follows with rheumatology at [facility].   DATA: 04/04/17 CXR: Severe hyperinflation and hyperlucency without acute findings 05/18/17 PFTs: Very severe obstruction, FEV1 0.96 L  (38%), hyperinflation-TLC 6.92 L (127%), RV 4.45 L (208%), DLCO 4.8 (28%), DLCO/VA 1.35 (37%) 07/18/2019 ONO: Had overnight oximetry which shows saturations as low as 78%, qualified for nocturnal O2, qualified also for daytime O2 but refuses daytime O2 07/24/2019 2D echo: Severe pulmonary hypertension noted (76 mmHg), grade 1 diastolic dysfunction noted, LVEF 60 to 65% 11/13/2019 PFT: FEV1 0.70 L or 28% predicted, FVC 1.88 L or 58% predicted there is hyperinflation with TLC at 134% predicted and air trapping with RV at 215% predicted.  Diffusion capacity was severely reduced at 16%, consistent with very severe COPD 10/31/2019 chest x-ray: Hyperinflation and emphysematous lung disease without active disease 05/17/2020 alpha-1: Phenotype MM, normal.  Level 127 mg/dL 11/91/4782 2D echo: LVEF 60 to 65%.  Grade I DD, mild MR.  Right ventricular systolic pressure estimated 58.8 mmHg (previously 76 mmHg).  Review of Systems A 10 point review of systems was performed and it is as noted above otherwise negative.  Patient Active Problem List   Diagnosis Date Noted   Carrie Hendricks 09/03/2021   Medicare annual wellness visit, subsequent 11/04/2020   Chronic respiratory failure with hypoxia (HCC) 04/28/2020   Healthcare maintenance 04/28/2020   Elevated hemoglobin (HCC) 03/08/2019   COPD, very severe (HCC) 08/15/2018   Sleep disorder 10/10/2017   Screening mammogram, encounter for 09/12/2017   Ulcerative colitis (HCC) 02/01/2017   Lumbar degenerative disc disease 01/20/2017   Scoliosis 06/28/2016   Welcome to Medicare preventive visit 06/10/2016   Estrogen deficiency 06/10/2016   Constipation 03/02/2016  Underweight 03/02/2016   Shortness of breath 05/16/2014   History of COPD 05/16/2014   Stress reaction 05/16/2014   Connective tissue disease (HCC) 03/05/2014   Pedal edema 10/15/2013   Pulmonary hypertension (HCC) 10/10/2011   Encounter for routine gynecological examination 07/11/2011   HEMORRHOIDS,  INTERNAL, WITH BLEEDING 03/15/2010   Essential hypertension 11/16/2006   Rheumatoid arthritis (HCC) 11/16/2006   Osteoporosis 11/16/2006   No Known Allergies  Current Meds  Medication Sig   albuterol (VENTOLIN HFA) 108 (90 Base) MCG/ACT inhaler INHALE 2 PUFFS INTO THE LUNGS EVERY 6 HOURS AS NEEDED FOR WHEEZING OR SHORTNESS OF BREATH.   amLODipine (NORVASC) 5 MG tablet Take 1 tablet (5 mg total) by mouth daily.   Aspirin 81 MG EC tablet Take 81 mg by mouth daily.   Budeson-Glycopyrrol-Formoterol (BREZTRI AEROSPHERE) 160-9-4.8 MCG/ACT AERO Inhale 2 puffs into the lungs in the morning and at bedtime.   Cholecalciferol (VITAMIN D3) 1.25 MG (50000 UT) TABS    etanercept (ENBREL) 50 MG/ML injection Inject 50 mg into the skin once a week.   hydrochlorothiazide (HYDRODIURIL) 25 MG tablet Take 1 tablet (25 mg total) by mouth daily.   hydroxychloroquine (PLAQUENIL) 200 MG tablet Take 200 mg by mouth daily.   Multiple Vitamin (MULTIVITAMIN) capsule Take 1 capsule by mouth daily.   PROLIA 60 MG/ML SOSY injection Inject into the skin.   sulfaSALAzine (AZULFIDINE) 500 MG tablet TAKE 1 TABLET FOUR TIMES DAILY   Immunization History  Administered Date(s) Administered   Fluad Quad(high Dose 65+) 03/08/2019, 04/17/2020   Hep A / Hep B 11/03/2016, 12/06/2016   Hepatitis B, adult 05/09/2017   Influenza Split 04/03/2011   Influenza Whole 04/03/2010   Influenza, High Dose Seasonal PF 04/08/2016, 03/31/2017, 04/21/2018   Influenza,inj,Quad PF,6+ Mos 03/05/2014   Influenza-Unspecified 04/03/2013, 03/05/2014, 05/03/2015, 04/17/2020, 04/08/2021, 04/17/2021, 04/01/2022   PFIZER Comirnaty(Gray Top)Covid-19 Tri-Sucrose Vaccine 08/15/2019, 09/05/2019, 04/17/2020   PFIZER(Purple Top)SARS-COV-2 Vaccination 08/15/2019, 09/05/2019, 04/17/2020, 01/05/2021   Pneumococcal Conjugate-13 06/10/2016   Pneumococcal Polysaccharide-23 03/28/2007, 10/15/2013, 08/15/2018   Td 06/11/2010   Unspecified SARS-COV-2 Vaccination  04/01/2022       Objective:   Physical Exam BP 128/80 (BP Location: Left Arm, Cuff Size: Normal)   Pulse 76   Temp 97.6 F (36.4 C)   Ht 5\' 6"  (1.676 m)   Wt 102 lb 6.4 oz (46.4 kg)   SpO2 97%   BMI 16.53 kg/m  Gen: Cachectic, chronically ill.  Use of accessories, chronic.  No overt tachypnea HEENT: NCAT, sclerae anicteric,dry mucous membranes, there is mild thrush.  No oral lesions. Neck: No JVD, trachea midline, no crepitus Thorax: Increased AP diameter, Hoover's sign present Lungs: breath sounds  distant throughout without wheezes nor other adventitious sounds Cardiovascular: RRR, no murmurs, distant tones Abdomen: Soft, nontender, normal BS Ext: without clubbing, cyanosis.  No pedal edema noted today.  She does have stigmata of rheumatoid arthritis on her hands/wrists Neuro: grossly intact Skin: Limited exam, no open lesions noted, she does have some ecchymoses/senile purpuras     Assessment & Plan:     ICD-10-CM   1. COPD, very severe (HCC)  J44.9    Trial of Bevespi 2 puffs twice a day Patient unable to use Breztri due to recurrent thrush Continue as needed albuterol    2. Chronic respiratory failure with hypoxia (HCC)  J96.11    Continue oxygen supplementation Patient compliant    3. Thrush  B37.0    Fluconazole prescription to pharmacy    4. Sicca, unspecified type (  HCC)  M35.00    Suspect this is driver for recurrent thrush    5. Pulmonary hypertension (HCC)  I27.20    Moderate Due to Cor pulmonale Continue oxygen supplementation    6. 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 Former smoker  Z87.891    No evidence of relapse     Will see the patient in follow-up in 4 months time call sooner should any new problems arise.  Gailen Shelter, MD Advanced Bronchoscopy PCCM [facility]    *This note was dictated using voice recognition software/Dragon.  Despite  best efforts to proofread, errors can occur which can change the meaning. Any transcriptional errors that result from this process are unintentional and may not be fully corrected at the time of dictation.

## 2022-04-08 NOTE — Patient Instructions (Addendum)
We are switching your inhaler to Bevespi which is basically Breztri without the steroid in it to see if this helps with your mouth issues.  I have sent in a prescription for a tablet to help you with your thrush.  This [facility] in [place].  We will see you in follow-up in 4 months time call sooner should any new problems arise.

## 2022-04-08 NOTE — Telephone Encounter (Signed)
Application has been faxed to [employer] patient assistance for Bevespi/

## 2022-04-25 ENCOUNTER — Emergency Department: Payer: Medicare HMO

## 2022-04-25 ENCOUNTER — Emergency Department
Admission: EM | Admit: 2022-04-25 | Discharge: 2022-04-25 | Disposition: A | Discharge status: Home / Self Care | Payer: Medicare HMO | Attending: Emergency Medicine | Admitting: Emergency Medicine

## 2022-04-25 DIAGNOSIS — S52592A Other fractures of lower end of left radius, initial encounter for closed fracture: Secondary | ICD-10-CM | POA: Diagnosis not present

## 2022-04-25 DIAGNOSIS — W1839XA Other fall on same level, initial encounter: Secondary | ICD-10-CM | POA: Insufficient documentation

## 2022-04-25 DIAGNOSIS — S0990XA Unspecified injury of head, initial encounter: Secondary | ICD-10-CM | POA: Diagnosis not present

## 2022-04-25 DIAGNOSIS — S4992XA Unspecified injury of left shoulder and upper arm, initial encounter: Secondary | ICD-10-CM | POA: Diagnosis not present

## 2022-04-25 DIAGNOSIS — M47812 Spondylosis without myelopathy or radiculopathy, cervical region: Secondary | ICD-10-CM | POA: Diagnosis not present

## 2022-04-25 DIAGNOSIS — Y92009 Unspecified place in unspecified non-institutional (private) residence as the place of occurrence of the external cause: Secondary | ICD-10-CM | POA: Insufficient documentation

## 2022-04-25 DIAGNOSIS — S0083XA Contusion of other part of head, initial encounter: Secondary | ICD-10-CM | POA: Insufficient documentation

## 2022-04-25 DIAGNOSIS — S52572A Other intraarticular fracture of lower end of left radius, initial encounter for closed fracture: Secondary | ICD-10-CM | POA: Diagnosis not present

## 2022-04-25 DIAGNOSIS — S52532A Colles' fracture of left radius, initial encounter for closed fracture: Secondary | ICD-10-CM | POA: Insufficient documentation

## 2022-04-25 DIAGNOSIS — S6992XA Unspecified injury of left wrist, hand and finger(s), initial encounter: Secondary | ICD-10-CM | POA: Diagnosis present

## 2022-04-25 MED ORDER — LIDOCAINE HCL (PF) 1 % IJ SOLN
5.0000 mL | Freq: Once | INTRAMUSCULAR | Status: AC
  Administered 2022-04-25: 5 mL via INTRADERMAL
  Filled 2022-04-25: qty 5

## 2022-04-25 MED ORDER — TRAMADOL HCL 50 MG PO TABS: 50.0000 mg | ORAL_TABLET | Freq: Three times a day (TID) | ORAL | 0 refills | Status: DC | PRN

## 2022-04-25 MED ORDER — BUPIVACAINE HCL (PF) 0.5 % IJ SOLN
20.0000 mL | Freq: Once | INTRAMUSCULAR | Status: AC
  Administered 2022-04-25: 20 mL
  Filled 2022-04-25: qty 20

## 2022-04-25 MED ORDER — ACETAMINOPHEN 325 MG PO TABS
650.0000 mg | ORAL_TABLET | Freq: Once | ORAL | Status: AC
  Administered 2022-04-25: 650 mg via ORAL
  Filled 2022-04-25: qty 2

## 2022-04-25 NOTE — ED Triage Notes (Addendum)
Pt sts that she fell at home but did not loose consciousness. Pt denies any blood thinners. Pt has injury to left wrist with bruising and deformity. Pt has sling on left arm for support. PMS noted in pt left hand.

## 2022-04-25 NOTE — ED Provider Notes (Signed)
[facility] Provider Note    Event Date/Time   First MD Initiated Contact with Patient 04/25/22 1451     (approximate)   History   Fall   HPI  Carrie Hendricks is a 71 y.o. female with history of OA and RA, and remaining history as listed in the EMR presents to the emergency department for treatment and evaluation of left wrist pain after a mechanical, nonsyncopal fall at home prior to arrival.  She was playing with her grand dog and fell forward while chasing it.  She reached out with her left hand to break her fall and struck her forehead on the wall.  No loss of consciousness.  She does have pain in the left wrist with history of surgery on the same.  She has a contusion over the left eye as well.  She has been ambulatory since the fall and denies pain in her hips or lower extremities.      Physical Exam   Triage Vital Signs: ED Triage Vitals  Enc Vitals Group     BP 04/25/22 1345 (!) 143/91     Pulse Rate 04/25/22 1345 71     Resp 04/25/22 1345 18     Temp 04/25/22 1403 97.8 F (36.6 C)     Temp Source 04/25/22 1403 Oral     SpO2 04/25/22 1345 100 %     Weight 04/25/22 1355 101 lb (45.8 kg)     Height --      Head Circumference --      Peak Flow --      Pain Score 04/25/22 1355 10     Pain Loc --      Pain Edu? --      Excl. in [place]? --     Most recent vital signs: Vitals:   04/25/22 1345 04/25/22 1403  BP: (!) 143/91   Pulse: 71   Resp: 18   Temp:  97.8 F (36.6 C)  SpO2: 100%      General: Awake, no distress.  CV:  Good peripheral perfusion.  Resp:  Normal effort.  Abd:  No distention.  Other:  Deformity of the left wrist noted.  Ecchymosis to the volar aspect of the left wrist without open wounds.  Full range of motion of the fingers of the left hand with normal sensation.  Ecchymosis noted to the left forehead.  Mild, nonfocal tenderness over the lateral aspect of the right side of the neck.  No focal tenderness over the  cervical vertebrae or along the length of the spine.   ED Results / Procedures / Treatments   Labs (all labs ordered are listed, but only abnormal results are displayed) Labs Reviewed - No data to display   EKG  Not indicated   RADIOLOGY CT head and cervical spine are negative for acute concerns.  Image of the left wrist shows a comminuted fracture of the distal radius involving the joint space with angular dorsal displacement.  Image interpreted and viewed by me.  Radiology report consistent with the same.  PROCEDURES:  Critical Care performed: No  .Ortho Injury Treatment  Date/Time: 04/25/2022 6:44 PM  Performed by: Victorino Dike, FNP Authorized by: Victorino Dike, FNP   Consent:    Consent obtained:  Verbal   Consent given by:  Patient   Procedural risks discussed: unsuccessful reduction.Injury location: wrist Location details: left wrist Injury type: fracture Fracture type: distal radius Pre-procedure neurovascular assessment: neurovascularly intact Pre-procedure distal  perfusion: normal Pre-procedure neurological function: normal Pre-procedure range of motion: reduced Anesthesia: hematoma block  Anesthesia: Local anesthesia used: yes Local Anesthetic: bupivacaine 0.5% without epinephrine and lidocaine 1% without epinephrine Anesthetic total: 6 mL  Patient sedated: NoManipulation performed: yes Skin traction used: yes Skeletal traction used: yes Reduction successful: no Immobilization: splint Splint type: reverse sugartong. Splint Applied by: ED Tech Supplies used: cotton padding, elastic bandage and Ortho-Glass Post-procedure neurovascular assessment: post-procedure neurovascularly intact Post-procedure distal perfusion: normal Post-procedure neurological function: normal Post-procedure range of motion: normal      MEDICATIONS ORDERED IN ED: Medications  acetaminophen (TYLENOL) tablet 650 mg (650 mg Oral Given 04/25/22 1547)   bupivacaine(PF) (MARCAINE) 0.5 % injection 20 mL (20 mLs Infiltration Given by Other 04/25/22 1548)  lidocaine (PF) (XYLOCAINE) 1 % injection 5 mL (5 mLs Intradermal Given by Other 04/25/22 1548)     IMPRESSION / MDM / [place] / ED COURSE  I reviewed the triage vital signs and the nursing notes.                              Differential diagnosis includes, but is not limited to head injury, wrist fracture, wrist sprain.  Patient's presentation is most consistent with acute presentation with potential threat to life or bodily function.  71 year old female presenting to the emergency department for treatment and evaluation after mechanical, nonsyncopal fall at home.  See HPI for further details.  On exam, she has an obvious deformity of the left wrist without associated open wounds.  X-ray shows a comminuted Colles' fracture with intra-articular extension and with dorsal angulation.  Case discussed with orthopedics on-call who recommends attempt at reduction and OCL with outpatient follow-up.  Adequate anesthesia achieved with hematoma block.  Attempted fracture reduction with finger traps and traction against resistance with the assistance of nursing staff.  I was able to obtain partial reduction with manipulation but not complete.  ED attending, Dr. Cheri Fowler was asked to assist as well.  We were able to get the fracture reduced but then once traction was released to apply a splint fracture fragments slid back out of alignment.  We attempted to use traction with finger traps and weights without success.  Patient remains neurovascularly intact and was able to perform range of motion of the fingers of the left hand after failed attempt at reduction.  Plan will be to splint with OCL and provide her with strict ER return precautions and follow-up information.      FINAL CLINICAL IMPRESSION(S) / ED DIAGNOSES   Final diagnoses:  Closed Colles' fracture of left radius, initial encounter      Rx / DC Orders   ED Discharge Orders          Ordered    traMADol (ULTRAM) 50 MG tablet  Every 8 hours PRN        04/25/22 1645             Note:  This document was prepared using Dragon voice recognition software and may include unintentional dictation errors.   Victorino Dike, FNP 04/25/22 Randol Kern    Naaman Plummer, MD 04/25/22 [phone]

## 2022-04-27 ENCOUNTER — Encounter: Payer: Self-pay | Admitting: Orthopedic Surgery

## 2022-04-27 ENCOUNTER — Other Ambulatory Visit: Payer: Self-pay | Admitting: Orthopedic Surgery

## 2022-04-27 DIAGNOSIS — S52572A Other intraarticular fracture of lower end of left radius, initial encounter for closed fracture: Secondary | ICD-10-CM | POA: Diagnosis not present

## 2022-04-28 ENCOUNTER — Telehealth: Payer: Self-pay | Admitting: Urgent Care

## 2022-04-28 ENCOUNTER — Other Ambulatory Visit: Payer: Self-pay | Admitting: Surgery

## 2022-04-28 ENCOUNTER — Other Ambulatory Visit
Admission: RE | Admit: 2022-04-28 | Discharge: 2022-04-28 | Disposition: A | Discharge status: Home / Self Care | Payer: Medicare HMO | Attending: Urgent Care | Admitting: Urgent Care

## 2022-04-28 DIAGNOSIS — Z01812 Encounter for preprocedural laboratory examination: Secondary | ICD-10-CM | POA: Insufficient documentation

## 2022-04-28 DIAGNOSIS — T502X5A Adverse effect of carbonic-anhydrase inhibitors, benzothiadiazides and other diuretics, initial encounter: Secondary | ICD-10-CM

## 2022-04-28 DIAGNOSIS — Z79899 Other long term (current) drug therapy: Secondary | ICD-10-CM | POA: Insufficient documentation

## 2022-04-28 LAB — POTASSIUM: Potassium: 3 mmol/L — ABNORMAL LOW (ref 3.5–5.1)

## 2022-04-28 MED ORDER — POTASSIUM CHLORIDE CRYS ER 20 MEQ PO TBCR: 60.0000 meq | EXTENDED_RELEASE_TABLET | Freq: Once | ORAL | 0 refills | Status: DC

## 2022-04-28 NOTE — Progress Notes (Signed)
  [facility] Perioperative Services: Pre-Admission/Anesthesia Testing  Abnormal Lab Notification and Treatment Plan of Care   Date: 04/28/22  Name: Carrie Hendricks MRN:   [id number]  Re: Abnormal labs noted during PAT appointment   Notified:  Provider Name Provider Role Notification Mode  Hessie Knows, MD Orthopedics (Surgeon) Routed and/or faxed via Beckie Salts, MD Primary Care Provider Routed and/or faxed via [place] and Notes:  ABNORMAL LAB VALUE(S): Lab Results  Component Value Date   K 3.0 (L) 04/28/2022   [name] is scheduled for an OPEN REDUCTION INTERNAL FIXATION (ORIF) LEFT DISTAL RADIUS FRACTURE 04/29/2022. In review of her medication reconciliation, it is noted that the patient is taking prescribed diuretic medications (HCTZ 25 mg) daily. Please note, in efforts to promote a safe and effective anesthetic course, per current guidelines/standards set by the [facility] anesthesia team, the minimal acceptable K+ level for the patient to proceed with general anesthesia is 3.0 mmol/L. With that being said, if the patient drops any lower, her elective procedure will need to be postponed until K+ is better optimized. In efforts to prevent case cancellation, will make efforts to optimize pre-surgical K+ level so that patient can safely undergo the planned surgical intervention.   Impression and Plan:  Carrie Hendricks found to be HYPOkalemic at 3.0 mmol/L on preoperative labs. She is on thiazide diuretic therapy. She does not take oral potassium supplement. Called patient to discuss results and plans for correction of noted electrolyte derangement, however unable to reach. Left detailed LMOM regarding plans for correction as follows:  Meds ordered this encounter  Medications   potassium chloride SA (KLOR-CON M) 20 MEQ tablet    Sig: Take 3 tablets (60 mEq total) by mouth once for 1 dose. Follow up with PCP for lab recheck  postoperatively    Dispense:  3 tablet    Refill:  0   Encouraged patient to follow up with PCP about 2-3 weeks postoperatively to have labs rechecked to ensure that levels are remaining within normal range.   Will send copy of this note to surgeon and PCP to make them aware of K+ level and plans for correction. Discussed that PCP may elect to pursue a change in diuretic therapy to a K+ sparing type medication, or alternatively, they may consider adding a daily K+ supplement. Patient verbalized understanding. Order entered to recheck K+ on the day of her surgery to ensure optimization. Wished patient the best of luck with her upcoming surgery and subsequent recovery. She was encouraged to return call to the PAT clinic, or to her surgeon's office, should any questions or concerns arise between now and the time of her surgery.   Encounter Diagnoses  Name Primary?   Pre-operative laboratory examination Yes   Diuretic-induced hypokalemia    Carrie Loh, MSN, APRN, FNP-C, CEN [facility]  Peri-operative Services Nurse Practitioner Phone: [phone] 04/28/22 9:28 AM  NOTE: This note has been prepared using Dragon dictation software. Despite my best ability to proofread, there is always the potential that unintentional transcriptional errors may still occur from this process.

## 2022-04-29 ENCOUNTER — Encounter
Admission: RE | Admit: 2022-04-29 | Discharge: 2022-04-29 | Disposition: A | Discharge status: Home / Self Care | Payer: Medicare HMO | Source: Ambulatory Visit | Attending: Surgery | Admitting: Surgery

## 2022-04-29 VITALS — Ht 66.0 in | Wt 101.0 lb

## 2022-04-29 DIAGNOSIS — Z79899 Other long term (current) drug therapy: Secondary | ICD-10-CM

## 2022-04-29 DIAGNOSIS — Z01812 Encounter for preprocedural laboratory examination: Secondary | ICD-10-CM

## 2022-04-29 DIAGNOSIS — I10 Essential (primary) hypertension: Secondary | ICD-10-CM

## 2022-04-29 HISTORY — DX: Chronic obstructive pulmonary disease, unspecified: J44.9

## 2022-04-29 NOTE — Patient Instructions (Addendum)
Your procedure is scheduled ML:[id number] May 03, 2022. Report to Day Surgery inside [place] 2nd floor, stop by registration desk before getting on elevator.  To find out your arrival time please call [phone] between 1PM - 3PM on Monday May 02, 2022.  Remember: Instructions that are not followed completely may result in serious medical risk,  up to and including death, or upon the discretion of your surgeon and anesthesiologist your  surgery may need to be rescheduled.     _X__ 1. Do not eat food after midnight the night before your procedure.                 No chewing gum or hard candies. You may drink clear liquids up to 2 hours                 before you are scheduled to arrive for your surgery- DO not drink clear                 liquids within 2 hours of the start of your surgery.                 Clear Liquids include:  water, apple juice without pulp, clear Gatorade, G2 or                  Gatorade Zero (avoid Red/Purple/Blue), Black Coffee or Tea (Do not add                 anything to coffee or tea).  __X__2.   Complete the "Ensure Clear Pre-surgery Clear Carbohydrate Drink" provided to you, 2 hours before arrival. **If you are diabetic you will be provided with an alternative drink, Gatorade Zero or G2.  __X__3.  On the morning of surgery brush your teeth with toothpaste and water, you                may rinse your mouth with mouthwash if you wish.  Do not swallow any toothpaste of mouthwash.     _X__ 4.  No Alcohol for 24 hours before or after surgery.   _X__ 5.  Do Not Smoke or use e-cigarettes For 24 Hours Prior to Your Surgery.                 Do not use any chewable tobacco products for at least 6 hours prior to                 Surgery.  _X__  6.  Do not use any recreational drugs (marijuana, cocaine, heroin, ecstasy, MDMA or other)                For at least one week prior to your surgery.  Combination of these drugs with  anesthesia                May have life threatening results.  ____  7.  Bring all medications with you on the day of surgery if instructed.   __X__ 8.  Notify your doctor if there is any change in your medical condition      (cold, fever, infections).     Do not wear jewelry, make-up, hairpins, clips or nail polish. Do not wear lotions, powders, or perfumes. You may wear deodorant. Do not shave 48 hours prior to surgery. Men may shave face and neck. Do not bring valuables to the hospital.    [facility] is not responsible for any belongings or valuables.  Contacts,  dentures or bridgework may not be worn into surgery. Leave your suitcase in the car. After surgery it may be brought to your room. For patients admitted to the hospital, discharge time is determined by your treatment team.   Patients discharged the day of surgery will not be allowed to drive home.   Make arrangements for someone to be with you for the first 24 hours of your Same Day Discharge.   __X__ Take these medicines the morning of surgery with A SIP OF WATER:    1. amLODipine (NORVASC) 5 MG   2.   3.   4.  5.  6.  ____ Fleet Enema (as directed)   __X__ Use CHG Soap (or wipes) as directed  ____ Use Benzoyl Peroxide Gel as instructed  __X__ Use inhalers on the day of surgery  Budeson-Glycopyrrol-Formoterol (BREZTRI AEROSPHERE) 160-9-4.8 MCG/ACT AERO  albuterol (VENTOLIN HFA) 108 (90 Base) MCG/ACT inhaler  ____ Stop metformin 2 days prior to surgery    ____ Take 1/2 of usual insulin dose the night before surgery. No insulin the morning          of surgery.   ____ Call your PCP, cardiologist, or Pulmonologist if taking Coumadin/Plavix/aspirin and ask when to stop before your surgery.   __X__ One Week prior to surgery- Stop Anti-inflammatories such as Ibuprofen, Aleve, Advil, Motrin, meloxicam (MOBIC), diclofenac, etodolac, ketorolac, Toradol, Daypro, piroxicam, Goody's or BC powders. OK TO USE TYLENOL  IF NEEDED   __X__ Stop supplements until after surgery.    ____ Bring C-Pap to the hospital.    If you have any questions regarding your pre-procedure instructions,  Please call Pre-admit Testing at [phone]

## 2022-05-03 ENCOUNTER — Ambulatory Visit: Payer: Medicare HMO

## 2022-05-03 ENCOUNTER — Telehealth: Payer: Self-pay

## 2022-05-03 ENCOUNTER — Encounter: Payer: Self-pay | Admitting: Surgery

## 2022-05-03 ENCOUNTER — Other Ambulatory Visit: Payer: Self-pay

## 2022-05-03 ENCOUNTER — Ambulatory Visit: Payer: Medicare HMO | Admitting: Anesthesiology

## 2022-05-03 ENCOUNTER — Encounter
Admission: RE | Disposition: A | Discharge status: Home / Self Care | Payer: Self-pay | Source: Ambulatory Visit | Attending: Surgery

## 2022-05-03 ENCOUNTER — Ambulatory Visit
Admission: RE | Admit: 2022-05-03 | Discharge: 2022-05-03 | Disposition: A | Discharge status: Home / Self Care | Payer: Medicare HMO | Source: Ambulatory Visit | Attending: Surgery | Admitting: Surgery

## 2022-05-03 DIAGNOSIS — I272 Pulmonary hypertension, unspecified: Secondary | ICD-10-CM | POA: Insufficient documentation

## 2022-05-03 DIAGNOSIS — W19XXXA Unspecified fall, initial encounter: Secondary | ICD-10-CM | POA: Diagnosis not present

## 2022-05-03 DIAGNOSIS — Z79899 Other long term (current) drug therapy: Secondary | ICD-10-CM

## 2022-05-03 DIAGNOSIS — S52572A Other intraarticular fracture of lower end of left radius, initial encounter for closed fracture: Secondary | ICD-10-CM | POA: Diagnosis not present

## 2022-05-03 DIAGNOSIS — G8918 Other acute postprocedural pain: Secondary | ICD-10-CM | POA: Diagnosis not present

## 2022-05-03 DIAGNOSIS — I10 Essential (primary) hypertension: Secondary | ICD-10-CM | POA: Insufficient documentation

## 2022-05-03 DIAGNOSIS — S52552A Other extraarticular fracture of lower end of left radius, initial encounter for closed fracture: Secondary | ICD-10-CM | POA: Diagnosis not present

## 2022-05-03 DIAGNOSIS — Z01812 Encounter for preprocedural laboratory examination: Secondary | ICD-10-CM

## 2022-05-03 DIAGNOSIS — M069 Rheumatoid arthritis, unspecified: Secondary | ICD-10-CM | POA: Insufficient documentation

## 2022-05-03 DIAGNOSIS — J449 Chronic obstructive pulmonary disease, unspecified: Secondary | ICD-10-CM | POA: Insufficient documentation

## 2022-05-03 DIAGNOSIS — S52532A Colles' fracture of left radius, initial encounter for closed fracture: Secondary | ICD-10-CM | POA: Diagnosis not present

## 2022-05-03 DIAGNOSIS — T502X5A Adverse effect of carbonic-anhydrase inhibitors, benzothiadiazides and other diuretics, initial encounter: Secondary | ICD-10-CM

## 2022-05-03 DIAGNOSIS — Z87891 Personal history of nicotine dependence: Secondary | ICD-10-CM | POA: Diagnosis not present

## 2022-05-03 HISTORY — PX: OPEN REDUCTION INTERNAL FIXATION (ORIF) DISTAL RADIAL FRACTURE: SHX5989

## 2022-05-03 LAB — POCT I-STAT, CHEM 8
BUN: 8 mg/dL (ref 8–23)
Calcium, Ion: 1.18 mmol/L (ref 1.15–1.40)
Chloride: 99 mmol/L (ref 98–111)
Creatinine, Ser: 0.5 mg/dL (ref 0.44–1.00)
Glucose, Bld: 91 mg/dL (ref 70–99)
HCT: 40 % (ref 36.0–46.0)
Hemoglobin: 13.6 g/dL (ref 12.0–15.0)
Potassium: 4 mmol/L (ref 3.5–5.1)
Sodium: 136 mmol/L (ref 135–145)
TCO2: 28 mmol/L (ref 22–32)

## 2022-05-03 SURGERY — OPEN REDUCTION INTERNAL FIXATION (ORIF) DISTAL RADIUS FRACTURE
Anesthesia: General | Laterality: Left

## 2022-05-03 MED ORDER — PHENYLEPHRINE 80 MCG/ML (10ML) SYRINGE FOR IV PUSH (FOR BLOOD PRESSURE SUPPORT)
PREFILLED_SYRINGE | INTRAVENOUS | Status: DC | PRN
  Administered 2022-05-03 (×3): 160 ug via INTRAVENOUS

## 2022-05-03 MED ORDER — FAMOTIDINE 20 MG PO TABS: 20.0000 mg | ORAL_TABLET | Freq: Once | ORAL | Status: AC

## 2022-05-03 MED ORDER — CHLORHEXIDINE GLUCONATE 0.12 % MT SOLN: 15.0000 mL | Freq: Once | OROMUCOSAL | Status: AC

## 2022-05-03 MED ORDER — BUPIVACAINE HCL (PF) 0.5 % IJ SOLN
INTRAMUSCULAR | Status: DC | PRN
  Administered 2022-05-03: 15 mL via PERINEURAL

## 2022-05-03 MED ORDER — ONDANSETRON HCL 4 MG/2ML IJ SOLN
INTRAMUSCULAR | Status: DC | PRN
  Administered 2022-05-03 (×2): 4 mg via INTRAVENOUS

## 2022-05-03 MED ORDER — LACTATED RINGERS IV SOLN: INTRAVENOUS | Status: DC

## 2022-05-03 MED ORDER — CHLORHEXIDINE GLUCONATE 0.12 % MT SOLN
OROMUCOSAL | Status: AC
  Administered 2022-05-03: 15 mL via OROMUCOSAL
  Filled 2022-05-03: qty 15

## 2022-05-03 MED ORDER — FENTANYL CITRATE (PF) 100 MCG/2ML IJ SOLN
INTRAMUSCULAR | Status: DC | PRN
  Administered 2022-05-03: 25 ug via EPIDURAL
  Administered 2022-05-03: 50 ug via EPIDURAL
  Administered 2022-05-03: 25 ug via EPIDURAL

## 2022-05-03 MED ORDER — BUPIVACAINE HCL (PF) 0.5 % IJ SOLN
INTRAMUSCULAR | Status: AC
  Filled 2022-05-03: qty 20

## 2022-05-03 MED ORDER — LIDOCAINE HCL (PF) 1 % IJ SOLN
INTRAMUSCULAR | Status: AC
  Filled 2022-05-03: qty 5

## 2022-05-03 MED ORDER — SULFASALAZINE 500 MG PO TABS: 500.0000 mg | ORAL_TABLET | Freq: Three times a day (TID) | ORAL | Status: DC

## 2022-05-03 MED ORDER — BUPIVACAINE-EPINEPHRINE (PF) 0.5% -1:200000 IJ SOLN
INTRAMUSCULAR | Status: DC | PRN
  Administered 2022-05-03: 20 mL

## 2022-05-03 MED ORDER — FAMOTIDINE 20 MG PO TABS
ORAL_TABLET | ORAL | Status: AC
  Administered 2022-05-03: 20 mg via ORAL
  Filled 2022-05-03: qty 1

## 2022-05-03 MED ORDER — FENTANYL CITRATE PF 50 MCG/ML IJ SOSY
PREFILLED_SYRINGE | INTRAMUSCULAR | Status: AC
  Filled 2022-05-03: qty 1

## 2022-05-03 MED ORDER — EPHEDRINE SULFATE (PRESSORS) 50 MG/ML IJ SOLN
INTRAMUSCULAR | Status: DC | PRN
  Administered 2022-05-03: 5 mg via INTRAVENOUS

## 2022-05-03 MED ORDER — FENTANYL CITRATE (PF) 100 MCG/2ML IJ SOLN
INTRAMUSCULAR | Status: DC | PRN
  Administered 2022-05-03: 50 ug via INTRAVENOUS

## 2022-05-03 MED ORDER — PROPOFOL 1000 MG/100ML IV EMUL
INTRAVENOUS | Status: AC
  Filled 2022-05-03: qty 100

## 2022-05-03 MED ORDER — ORAL CARE MOUTH RINSE: 15.0000 mL | Freq: Once | OROMUCOSAL | Status: AC

## 2022-05-03 MED ORDER — BUPIVACAINE-EPINEPHRINE (PF) 0.5% -1:200000 IJ SOLN
INTRAMUSCULAR | Status: AC
  Filled 2022-05-03: qty 30

## 2022-05-03 MED ORDER — BUPIVACAINE LIPOSOME 1.3 % IJ SUSP
INTRAMUSCULAR | Status: AC
  Filled 2022-05-03: qty 10

## 2022-05-03 MED ORDER — CEFAZOLIN SODIUM-DEXTROSE 2-4 GM/100ML-% IV SOLN
2.0000 g | INTRAVENOUS | Status: AC
  Administered 2022-05-03: 2 g via INTRAVENOUS

## 2022-05-03 MED ORDER — 0.9 % SODIUM CHLORIDE (POUR BTL) OPTIME
TOPICAL | Status: DC | PRN
  Administered 2022-05-03: 500 mL

## 2022-05-03 MED ORDER — LIDOCAINE HCL (PF) 1 % IJ SOLN
INTRAMUSCULAR | Status: DC | PRN
  Administered 2022-05-03: 5 mL

## 2022-05-03 MED ORDER — PROPOFOL 500 MG/50ML IV EMUL
INTRAVENOUS | Status: DC | PRN
  Administered 2022-05-03: 75 ug/kg/min via INTRAVENOUS

## 2022-05-03 MED ORDER — PHENYLEPHRINE HCL-NACL 20-0.9 MG/250ML-% IV SOLN
INTRAVENOUS | Status: DC | PRN
  Administered 2022-05-03: 25 ug/min via INTRAVENOUS

## 2022-05-03 MED ORDER — DEXAMETHASONE SODIUM PHOSPHATE 10 MG/ML IJ SOLN
INTRAMUSCULAR | Status: DC | PRN
  Administered 2022-05-03: 10 mg via INTRAVENOUS

## 2022-05-03 MED ORDER — GLYCOPYRROLATE 0.2 MG/ML IJ SOLN
INTRAMUSCULAR | Status: DC | PRN
  Administered 2022-05-03: .2 mg via INTRAVENOUS

## 2022-05-03 MED ORDER — PROPOFOL 10 MG/ML IV BOLUS
INTRAVENOUS | Status: AC
  Filled 2022-05-03: qty 20

## 2022-05-03 MED ORDER — PROPOFOL 10 MG/ML IV BOLUS
INTRAVENOUS | Status: DC | PRN
  Administered 2022-05-03: 20 mg via INTRAVENOUS
  Administered 2022-05-03: 10 mg via INTRAVENOUS
  Administered 2022-05-03: 40 mg via INTRAVENOUS

## 2022-05-03 MED ORDER — FENTANYL CITRATE (PF) 100 MCG/2ML IJ SOLN
INTRAMUSCULAR | Status: AC
  Filled 2022-05-03: qty 2

## 2022-05-03 MED ORDER — CEFAZOLIN SODIUM-DEXTROSE 2-4 GM/100ML-% IV SOLN: 2.0000 g | INTRAVENOUS | Status: DC

## 2022-05-03 MED ORDER — ACETAMINOPHEN 10 MG/ML IV SOLN
INTRAVENOUS | Status: AC
  Filled 2022-05-03: qty 100

## 2022-05-03 MED ORDER — ACETAMINOPHEN 10 MG/ML IV SOLN
INTRAVENOUS | Status: DC | PRN
  Administered 2022-05-03: 1000 mg via INTRAVENOUS

## 2022-05-03 MED ORDER — CEFAZOLIN SODIUM-DEXTROSE 2-4 GM/100ML-% IV SOLN
INTRAVENOUS | Status: AC
  Filled 2022-05-03: qty 100

## 2022-05-03 SURGICAL SUPPLY — 63 items
APL PRP STRL LF DISP 70% ISPRP (MISCELLANEOUS) ×1
BIT DRILL 2.2 SS TIBIAL (BIT) IMPLANT
BNDG CMPR 5X4 CHSV STRCH STRL (GAUZE/BANDAGES/DRESSINGS) ×1
BNDG COHESIVE 4X5 TAN STRL LF (GAUZE/BANDAGES/DRESSINGS) ×1 IMPLANT
BNDG ELASTIC 4X5.8 VLCR STR LF (GAUZE/BANDAGES/DRESSINGS) ×1 IMPLANT
BNDG ESMARK 4X12 TAN STRL LF (GAUZE/BANDAGES/DRESSINGS) ×1 IMPLANT
CHLORAPREP W/TINT 26 (MISCELLANEOUS) ×2 IMPLANT
CORD BIP STRL DISP 12FT (MISCELLANEOUS) ×1 IMPLANT
CUFF TOURN SGL QUICK 18X4 (TOURNIQUET CUFF) IMPLANT
DRAPE FLUOR MINI C-ARM 54X84 (DRAPES) ×1 IMPLANT
DRAPE ORTHO SPLIT 77X108 STRL (DRAPES) ×1
DRAPE SURG 17X11 SM STRL (DRAPES) ×1 IMPLANT
DRAPE SURG ORHT 6 SPLT 77X108 (DRAPES) ×1 IMPLANT
DRAPE U-SHAPE 47X51 STRL (DRAPES) ×1 IMPLANT
ELECT CAUTERY BLADE 6.4 (BLADE) ×1 IMPLANT
ELECT REM PT RETURN 9FT ADLT (ELECTROSURGICAL) ×1
ELECTRODE REM PT RTRN 9FT ADLT (ELECTROSURGICAL) ×1 IMPLANT
FORCEPS JEWEL BIP 4-3/4 STR (INSTRUMENTS) ×1 IMPLANT
GAUZE SPONGE 4X4 12PLY STRL (GAUZE/BANDAGES/DRESSINGS) ×1 IMPLANT
GAUZE XEROFORM 1X8 LF (GAUZE/BANDAGES/DRESSINGS) ×1 IMPLANT
GLOVE BIO SURGEON STRL SZ8 (GLOVE) ×1 IMPLANT
GLOVE SURG UNDER LTX SZ8 (GLOVE) ×1 IMPLANT
GOWN STRL REUS W/ TWL LRG LVL3 (GOWN DISPOSABLE) ×1 IMPLANT
GOWN STRL REUS W/ TWL XL LVL3 (GOWN DISPOSABLE) ×1 IMPLANT
GOWN STRL REUS W/TWL LRG LVL3 (GOWN DISPOSABLE) ×1
GOWN STRL REUS W/TWL XL LVL3 (GOWN DISPOSABLE) ×1
K-WIRE 1.6 (WIRE) ×2
K-WIRE FX5X1.6XNS BN SS (WIRE) ×2
KIT TURNOVER KIT A (KITS) ×1 IMPLANT
KWIRE FX5X1.6XNS BN SS (WIRE) IMPLANT
MANIFOLD NEPTUNE II (INSTRUMENTS) ×1 IMPLANT
NDL FILTER BLUNT 18X1 1/2 (NEEDLE) ×1 IMPLANT
NEEDLE FILTER BLUNT 18X1 1/2 (NEEDLE) ×1 IMPLANT
NS IRRIG 500ML POUR BTL (IV SOLUTION) ×1 IMPLANT
PACK EXTREMITY ARMC (MISCELLANEOUS) ×1 IMPLANT
PAD CAST CTTN 4X4 STRL (SOFTGOODS) IMPLANT
PADDING CAST BLEND 4X4 STRL (MISCELLANEOUS) ×2 IMPLANT
PADDING CAST COTTON 4X4 STRL (SOFTGOODS) ×2
PEG LOCKING SMOOTH 2.2X16 (Screw) IMPLANT
PEG LOCKING SMOOTH 2.2X18 (Peg) IMPLANT
PEG LOCKING SMOOTH 2.2X20 (Screw) IMPLANT
PLATE NARROW DVR LEFT (Plate) IMPLANT
SCREW  LP NL 2.7X15MM (Screw) ×1 IMPLANT
SCREW LOCK 14X2.7X 3 LD TPR (Screw) IMPLANT
SCREW LOCKING 2.7X14 (Screw) ×1 IMPLANT
SCREW LOCKING 2.7X15MM (Screw) IMPLANT
SCREW LP NL 2.7X15MM (Screw) IMPLANT
SCREW NONLOCK 2.7X18MM (Screw) IMPLANT
SLING ARM S TX990203 (SOFTGOODS) IMPLANT
SPLINT CAST 1 STEP 3X12 (MISCELLANEOUS) IMPLANT
SPLINT CAST 1 STEP 4X15 (MISCELLANEOUS) IMPLANT
STAPLER SKIN PROX 35W (STAPLE) ×1 IMPLANT
STOCKINETTE IMPERVIOUS 9X36 MD (GAUZE/BANDAGES/DRESSINGS) ×1 IMPLANT
STRIP CLOSURE SKIN 1/4X4 (GAUZE/BANDAGES/DRESSINGS) IMPLANT
SUT PROLENE 4 0 PS 2 18 (SUTURE) ×1 IMPLANT
SUT VIC AB 2-0 SH 27 (SUTURE) ×1
SUT VIC AB 2-0 SH 27XBRD (SUTURE) ×1 IMPLANT
SUT VIC AB 3-0 SH 27 (SUTURE) ×1
SUT VIC AB 3-0 SH 27X BRD (SUTURE) ×1 IMPLANT
SWABSTK COMLB BENZOIN TINCTURE (MISCELLANEOUS) IMPLANT
SYR 10ML LL (SYRINGE) ×1 IMPLANT
TRAP FLUID SMOKE EVACUATOR (MISCELLANEOUS) ×1 IMPLANT
WATER STERILE IRR 500ML POUR (IV SOLUTION) ×1 IMPLANT

## 2022-05-03 NOTE — Transfer of Care (Signed)
Immediate Anesthesia Transfer of Care Note  Patient: Carrie Hendricks  Procedure(s) Performed: OPEN REDUCTION INTERNAL FIXATION (ORIF) DISTAL RADIUS FRACTURE (Left)  Patient Location: PACU  Anesthesia Type:MAC combined with regional for post-op pain  Level of Consciousness: awake, drowsy and patient cooperative  Airway & Oxygen Therapy: Patient Spontanous Breathing  Post-op Assessment: Report given to RN and Post -op Vital signs reviewed and stable  Post vital signs: Reviewed and stable  Last Vitals:  Vitals Value Taken Time  BP 110/63 05/03/22 1728  Temp    Pulse 74 05/03/22 1730  Resp 15 05/03/22 1730  SpO2 100 % 05/03/22 1730  Vitals shown include unvalidated device data.  Last Pain:  Vitals:   05/03/22 1528  TempSrc:   PainSc: 5          Complications: No notable events documented.

## 2022-05-03 NOTE — Anesthesia Procedure Notes (Signed)
Anesthesia Regional Block: Axillary brachial plexus block   Pre-Anesthetic Checklist: , timeout performed,  Correct Patient, Correct Site, Correct Laterality,  Correct Procedure, Correct Position, site marked,  Risks and benefits discussed,  Surgical consent,  Pre-op evaluation,  At surgeon's request and post-op pain management  Laterality: Upper and Left  Prep: chloraprep       Needles:  Injection technique: Single-shot  Needle Type: Stimiplex     Needle Length: 9cm  Needle Gauge: 22     Additional Needles:   Procedures:,,,, ultrasound used (permanent image in chart),,    Narrative:  Start time: 05/03/2022 3:30 PM End time: 05/03/2022 3:36 PM Injection made incrementally with aspirations every 5 mL.  Performed by: Personally  Anesthesiologist: Iran Ouch, MD  Additional Notes: Patient consented for risk and benefits of nerve block including but not limited to nerve damage, failed block, bleeding and infection.  Patient voiced understanding.  Functioning IV was confirmed and monitors were applied.  Timeout done prior to procedure and prior to any sedation being given to the patient.  Patient confirmed procedure site prior to any sedation given to the patient. Sterile prep,hand hygiene and sterile gloves were used.  Minimal sedation used for procedure.  No paresthesia endorsed by patient during the procedure.  Negative aspiration and negative test dose prior to incremental administration of local anesthetic. The patient tolerated the procedure well with no immediate complications.

## 2022-05-03 NOTE — Op Note (Signed)
05/03/2022  5:31 PM  Patient:   Carrie Hendricks  Pre-Op Diagnosis:   Closed acute extra-articular distal radius fracture, left wrist.  Post-Op Diagnosis:   Same.  Procedure:   Open reduction and internal fixation of left distal radius fracture.  Surgeon:   Pascal Lux, MD  Assistant:   Reymundo Poll, PA-S  Anesthesia:   IV sedation with axillary block placed by anesthesiologist  Findings:   As above.  Complications:   None  EBL:   5 cc  Fluids:   500 cc crystalloid  TT:   65 minutes at 250 mmHg  Drains:   None  Closure:   3-0 Vicryl subcuticular sutures  Implants:   Biomet DVR Cross-locked narrow precontoured distal radius mini-locking plate.  Brief Clinical Note:   The patient is a 71 year old female who sustained the above-noted injury last week. An attempt was made to reduce the fracture in the emergency room with little success. She presents at this time for definitive management of her injury.  Procedure:   The patient underwent the placement of an axillary block in the preoperative holding area by the anesthesiologist before she was brought into the operating room and lain in the supine position. After adequate IV sedation was obtained, the patient's left hand and upper extremity were prepped with ChloraPrep solution before being draped sterilely. Preoperative antibiotics were administered. A timeout was performed to verify the appropriate surgical site before the limb was exsanguinated with an Esmarch and the tourniquet inflated to 250 mmHg.   An approximately 7-8 cm incision was made over the volar aspect of the distal radius beginning at the volar flexion crease and extending proximally along the flexor carpi radialis tendon. The incision was carried down through the subcutaneous tissues to expose the superficial retinaculum. This was split the length of the incision directly over the flexor carpi radialis tendon. The FCR sheath was opened and the tendon  retracted ulnarly to protect the median nerve. The floor of the FCR sheath was opened to expose the pronator quadratus muscle. This was released along the radial insertion and the muscle was retracted ulnarly to expose the distal radius. The fracture was identified and soft tissues elevated off the distal metaphyseal region for several centimeters.   The appropriate sized plate was selected and positioned on the distal radius after reducing the fracture. A guidewire was placed through the distal hole and its position verified using FluoroScan imaging in AP and lateral projections. After several attempts, the pin was positioned parallel to the distal articular surface and approximately 3-4 mm proximal to the articular surface. Distally, a nonlocking cortical screw was placed in the ulnar-most hole of the more distal row. The other holes were filled with locking pegs of the appropriate lengths. The plate was carefully lowered onto the volar metaphyseal surface, reducing the fracture in the process. Again the position of the plate was verified using FluoroScan imaging in AP and lateral projections and found to be excellent. The plate was secured using one nonlocking bicortical screw and 2 locking cortical screws proximally. The adequacy of hardware position and fracture reduction was verified using FluoroScan imaging in AP, lateral, and several additional oblique projections to be sure that the hardware did not enter the joint nor did it penetrate dorsally.   The wound was copiously irrigated with sterile saline solution before the pronator quadratus was reapproximated using 2-0 Vicryl interrupted sutures. The subcutaneous tissues were closed using 2-0 Vicryl interrupted sutures before the subcuticular layer was  closed using 3-0 Vicryl inverted interrupted sutures. Benzoin and Steri-Strips are applied to the skin. A mixture of 10 cc of 0.5% plain Sensorcaine and 10 cc of Exparel was injected in and around the  incision site to help with postoperative analgesia before a sterile bulky dressing was applied to the wound. The patient was placed into a volar splint maintaining the wrist in neutral position before the patient was awakened and returned to the recovery room in satisfactory condition after tolerating the procedure well.

## 2022-05-03 NOTE — H&P (Signed)
History of Present Illness: Carrie Hendricks is a 71 y.o. female who presents today for evaluation of a fall on her left wrist on 04/25/2022. Patient fell and injured her left wrist. She is right-hand dominant. She was seen in the emergency department where she had displaced distal radial intra-articular fracture with impaction. She underwent closed reduction and splinting in the emergency department and did see some mild improvement in placement. Patient denies any numbness or tingling. Pain is mild she has not take any medications for pain. She has a history of rheumatoid arthritis.  Past Medical History: Connective tissue disease overlap syndrome (CMS-HCC)  Positive rheumatoid factor. Positive anti CCP antibodies. Erosions. Nodules. Status post goal. Status post Enbrel. Methotrexate. Positive Scl 70 antibodies. Raynaud's. Elevated pulmonary pressures.  Hypertension  Osteoporosis ([place]) 03/05/2014 (Fosamax)  Scoliosis   Past Surgical History: COLONOSCOPY 05/18/2007   Past Family History: Lung disease Mother  Heart failure Mother   Medications: albuterol 90 mcg/actuation inhaler Inhale 2 inhalations into the lungs as needed  amLODIPine (NORVASC) 5 MG tablet TAKE 1 TABLET ONE TIME DAILY 90 tablet 10  budesonide-glycopyrrolate-formoterol (BREZTRI AEROSPHERE) 160-9-4.8 mcg/actuation inhaler Inhale into the lungs  cholecalciferol, vitamin D3, 1,250 mcg (50,000 unit) Tab  etanercept (ENBREL SURECLICK) 50 mg/mL (1 mL) pen injector Inject 1 mL (50 mg total) subcutaneously once a week 12 mL 3  hydroCHLOROthiazide (HYDRODIURIL) 25 MG tablet TAKE 1 TABLET ONE TIME DAILY 90 tablet 1  hydroxychloroquine (PLAQUENIL) 200 mg tablet Take 1 tablet (200 mg total) by mouth once daily 90 tablet 1  multivitamin capsule Take by mouth.  multivitamin with minerals, EYE, (PRESERVISION AREDS-2) soft gel capsule Take 1 capsule by mouth 2 (two) times daily with meals (Patient not taking: Reported on 04/27/2022)   OXYGEN-AIR DELIVERY SYSTEMS MISC Use Is on oxygen at night mostly.  PROLIA 60 mg/mL inj syringe Inject subcutaneously (Patient not taking: Reported on 04/27/2022)  sulfaSALAzine (AZULFIDINE) 500 mg tablet   Allergies: No Known Allergies   Review of Systems:  A comprehensive 14 point ROS was performed, reviewed by me today, and the pertinent orthopaedic findings are documented in the HPI.  Physical Exam: BP 124/72  Ht 165.1 cm (5' 5" )  Wt 45.4 kg (100 lb)  BMI 16.64 kg/m  General:  Well developed, well nourished, no apparent distress, normal affect, normal gait with no antalgic component.   HEENT: Head normocephalic, atraumatic, PERRL.   Abdomen: Soft, non tender, non distended, Bowel sounds present.  Heart: Examination of the heart reveals regular, rate, and rhythm. There is no murmur noted on ascultation. There is a normal apical pulse.  Lungs: Lungs are clear to auscultation. There is no wheeze, rhonchi, or crackles. There is normal expansion of bilateral chest walls.   Left upper extremity: Examination of the left wrist shows patient is in a well fitted sugar-tong splint. Sugar-tong is extending past the digits, this was modified maintaining good compression over the fracture. Distal portions of the splint were reduced/cut back to the MCPs to allow for digit range of motion. Sensation is intact distally. Mild bruising and swelling. No skin breakdown noted. 2+ cap refill 2+ radial pulse  LEFT WRIST - 2 VIEW:  No significant change in the comminuted intra-articular distal  left radial fracture within without traction, with marked impaction,  dorsal displacement, and dorsal angulation as before.   Impression: 1. Closed intra-articular fracture of distal end of left radius.  Plan:  The treatment options, including both surgical and nonsurgical choices, have been discussed in  detail with the patient and her family. The patient would like to proceed with surgical  intervention to include an open reduction and internal fixation of the displaced left distal radius fracture. The risks (including bleeding, infection, nerve and/or blood vessel injury, persistent or recurrent pain, loosening or failure of the components, leg length inequality, dislocation, need for further surgery, blood clots, strokes, heart attacks or arrhythmias, pneumonia, etc.) and benefits of the surgical procedure were discussed. The patient states her understanding and agrees to proceed. A formal written consent will be obtained by the nursing staff.    H&P reviewed and patient re-examined. No changes.

## 2022-05-03 NOTE — Discharge Instructions (Addendum)
Orthopedic discharge instructions: Keep splint dry and intact. Keep hand elevated above heart level. Apply ice to affected area frequently. Take ES Tylenol when needed.  Return for follow-up in 10-14 days or as scheduled.AMBULATORY SURGERY  DISCHARGE INSTRUCTIONS   The drugs that you were given will stay in your system until tomorrow so for the next 24 hours you should not:  Drive an automobile Make any legal decisions Drink any alcoholic beverage   You may resume regular meals tomorrow.  Today it is better to start with liquids and gradually work up to solid foods.  You may eat anything you prefer, but it is better to start with liquids, then soup and crackers, and gradually work up to solid foods.   Please notify your doctor immediately if you have any unusual bleeding, trouble breathing, redness and pain at the surgery site, drainage, fever, or pain not relieved by medication.    Additional Instructions: Surgery is complete. Please report to the surgery info. desk to speak with the surgeon.  If you are not in the hospital, the surgeon will call you.        Please contact your physician with any problems or Same Day Surgery at [phone], Monday through Friday 6 am to 4 pm, or [place] at [facility] number at [phone].

## 2022-05-03 NOTE — Anesthesia Procedure Notes (Addendum)
Procedure Name: General with mask airway Date/Time: 05/03/2022 4:09 PM  Performed by: Kelton Pillar, CRNAPre-anesthesia Checklist: Patient identified, Emergency Drugs available, Suction available and Patient being monitored Patient Re-evaluated:Patient Re-evaluated prior to induction Oxygen Delivery Method: Simple face mask Induction Type: IV induction Placement Confirmation: positive ETCO2, CO2 detector and breath sounds checked- equal and bilateral Dental Injury: Teeth and Oropharynx as per pre-operative assessment

## 2022-05-03 NOTE — Anesthesia Preprocedure Evaluation (Addendum)
Anesthesia Evaluation  Patient identified by MRN, date of birth, ID band Patient awake    Reviewed: Allergy & Precautions, NPO status , Patient's Chart, lab work & pertinent test results, reviewed documented beta blocker date and time   History of Anesthesia Complications (+) PONV and history of anesthetic complications  Airway Mallampati: II  TM Distance: >3 FB     Dental no notable dental hx. (+) Dental Advidsory Given   Pulmonary shortness of breath and with exertion, COPD,  COPD inhaler, neg recent URI, former smoker,           Cardiovascular Exercise Tolerance: Poor hypertension, Pt. on medications pulmonary hypertension(-) angina+ DOE  (-) CAD, (-) Past MI, (-) Cardiac Stents and (-) CABG (-) dysrhythmias (-) Valvular Problems/Murmurs Rhythm:Regular Rate:Normal - Systolic murmurs ECHO: 1. Left ventricular ejection fraction, by estimation, is 60 to 65%. The  left ventricle has normal function. The left ventricle has no regional  wall motion abnormalities. Left ventricular diastolic parameters are  consistent with Grade I diastolic  dysfunction (impaired relaxation).  2. Right ventricular systolic function is normal. The right ventricular  size is normal. There is moderately elevated pulmonary artery systolic  pressure. The estimated right ventricular systolic pressure is 15.7 mmHg.  3. The mitral valve is normal in structure. Mild mitral valve  regurgitation.  4. Tricuspid valve regurgitation is moderate.  5. The inferior vena cava is dilated in size with <50% respiratory  variability, suggesting right atrial pressure of 15 mmHg.   Neuro/Psych PSYCHIATRIC DISORDERS Anxiety negative neurological ROS     GI/Hepatic negative GI ROS, Neg liver ROS,   Endo/Other  negative endocrine ROS  Renal/GU negative Renal ROS     Musculoskeletal  (+) Arthritis ,   Abdominal Normal abdominal exam  (+)   Peds   Hematology   Anesthesia Other Findings Past Medical History: No date: Arthritis     Comment:  rheumatoid No date: Complication of anesthesia No date: Hypertension No date: Osteoporosis No date: PONV (postoperative nausea and vomiting) No date: Pulmonary hypertension (HCC) No date: Rheumatoid arthritis(714.0)     Comment:  ? scleroderma   Reproductive/Obstetrics negative OB ROS                           Anesthesia Physical  Anesthesia Plan  ASA: III  Anesthesia Plan: General   Post-op Pain Management: Regional block*   Induction: Intravenous  PONV Risk Score and Plan: 4 or greater and Propofol infusion  Airway Management Planned: Nasal Cannula and Natural Airway  Additional Equipment:   Intra-op Plan:   Post-operative Plan:   Informed Consent: I have reviewed the patients History and Physical, chart, labs and discussed the procedure including the risks, benefits and alternatives for the proposed anesthesia with the patient or authorized representative who has indicated his/her understanding and acceptance.     Dental advisory given  Plan Discussed with: CRNA and Anesthesiologist  Anesthesia Plan Comments:        Anesthesia Quick Evaluation

## 2022-05-03 NOTE — Telephone Encounter (Signed)
     Patient  visit on 10/23  at [facility] you been able to follow up with your primary care physician? yes  The patient was or was not able to obtain any needed medicine or equipment. yes  Are there diet recommendations that you are having difficulty following? na  Patient expresses understanding of discharge instructions and education provided has no other needs at this time.  yes    [place], Care Management  [phone] [address], [place], [place] [address] Phone: [phone] Email: [email or web address]

## 2022-05-04 ENCOUNTER — Encounter: Payer: Self-pay | Admitting: Surgery

## 2022-05-04 NOTE — Anesthesia Postprocedure Evaluation (Signed)
Anesthesia Post Note  Patient: Carrie Hendricks  Procedure(s) Performed: OPEN REDUCTION INTERNAL FIXATION (ORIF) DISTAL RADIUS FRACTURE (Left)  Patient location during evaluation: PACU Anesthesia Type: General Level of consciousness: awake and alert Pain management: pain level controlled Vital Signs Assessment: post-procedure vital signs reviewed and stable Respiratory status: spontaneous breathing, nonlabored ventilation and respiratory function stable Cardiovascular status: blood pressure returned to baseline and stable Postop Assessment: no apparent nausea or vomiting Anesthetic complications: no   No notable events documented.   Last Vitals:  Vitals:   05/03/22 1804 05/03/22 1806  BP: 138/76   Pulse: 71   Resp: 18   Temp: (!) 36.1 C   SpO2:  93%    Last Pain:  Vitals:   05/03/22 1806  TempSrc:   PainSc: 0-No pain                 Iran Ouch

## 2022-05-06 ENCOUNTER — Encounter: Payer: Self-pay | Admitting: Surgery

## 2022-05-11 ENCOUNTER — Telehealth: Payer: Self-pay | Admitting: Pulmonary Disease

## 2022-05-11 NOTE — Telephone Encounter (Signed)
Okay to restart [employer].  2 puffs twice a day.  Make sure she rinses her mouth well particularly with a little baking soda in the water.  She was having a lot of problems with thrush with the [employer].

## 2022-05-11 NOTE — Telephone Encounter (Signed)
Patient is aware of below message and voiced her understanding.  Nothing further needed.   

## 2022-05-11 NOTE — Telephone Encounter (Addendum)
Called and spoke to patient.  She is requesting to switch back to [place]. She does not feel that Bevespi is not effective. She does not need refills as she has a year supply.   Dr. Patsey Berthold, please advise. Thanks

## 2022-05-13 DIAGNOSIS — S52572D Other intraarticular fracture of lower end of left radius, subsequent encounter for closed fracture with routine healing: Secondary | ICD-10-CM | POA: Diagnosis not present

## 2022-05-14 ENCOUNTER — Encounter: Payer: Self-pay | Admitting: Pulmonary Disease

## 2022-05-16 NOTE — Telephone Encounter (Signed)
I have made Dr. Patsey Berthold aware of this verbally.  Nothing further needed.

## 2022-05-16 NOTE — Telephone Encounter (Signed)
Routing to Dr. Patsey Berthold as an Juluis Rainier

## 2022-05-16 NOTE — Telephone Encounter (Signed)
I do not know if she has ever tried Trelegy, we could try to see if this is less of a problem for her since it is only once a day.  We could give her a sample to see how she does with that, if she cannot tolerate it then she can go back to the [place].

## 2022-05-23 DIAGNOSIS — M349 Systemic sclerosis, unspecified: Secondary | ICD-10-CM | POA: Diagnosis not present

## 2022-05-23 DIAGNOSIS — M818 Other osteoporosis without current pathological fracture: Secondary | ICD-10-CM | POA: Diagnosis not present

## 2022-05-23 DIAGNOSIS — I7300 Raynaud's syndrome without gangrene: Secondary | ICD-10-CM | POA: Diagnosis not present

## 2022-05-23 DIAGNOSIS — M0579 Rheumatoid arthritis with rheumatoid factor of multiple sites without organ or systems involvement: Secondary | ICD-10-CM | POA: Diagnosis not present

## 2022-05-23 DIAGNOSIS — Z79899 Other long term (current) drug therapy: Secondary | ICD-10-CM | POA: Diagnosis not present

## 2022-05-23 DIAGNOSIS — K51919 Ulcerative colitis, unspecified with unspecified complications: Secondary | ICD-10-CM | POA: Diagnosis not present

## 2022-05-23 DIAGNOSIS — J431 Panlobular emphysema: Secondary | ICD-10-CM | POA: Diagnosis not present

## 2022-06-13 DIAGNOSIS — S52532D Colles' fracture of left radius, subsequent encounter for closed fracture with routine healing: Secondary | ICD-10-CM | POA: Diagnosis not present

## 2022-06-15 ENCOUNTER — Telehealth: Payer: Self-pay | Admitting: Pulmonary Disease

## 2022-06-15 NOTE — Telephone Encounter (Signed)
PT called saying she got a msg/ Not sure who or how it was del. Pls call to consult. [phone]

## 2022-06-15 NOTE — Telephone Encounter (Signed)
I spoke with the patient. She had received a call to call back and schedule an appt with Dr. Patsey Berthold. I scheduled her an appt for 08/08/22 at 10:00am. Nothing further is needed.

## 2022-07-19 ENCOUNTER — Ambulatory Visit
Admission: RE | Admit: 2022-07-19 | Discharge: 2022-07-19 | Disposition: A | Discharge status: Home / Self Care | Payer: Medicare HMO | Source: Ambulatory Visit | Attending: Internal Medicine | Admitting: Internal Medicine

## 2022-07-19 DIAGNOSIS — Z1231 Encounter for screening mammogram for malignant neoplasm of breast: Secondary | ICD-10-CM

## 2022-08-08 ENCOUNTER — Encounter: Payer: Self-pay | Admitting: Pulmonary Disease

## 2022-08-08 ENCOUNTER — Ambulatory Visit: Payer: Medicare HMO | Admitting: Pulmonary Disease

## 2022-08-08 VITALS — BP 138/78 | HR 81 | Ht 66.0 in | Wt 105.8 lb

## 2022-08-08 DIAGNOSIS — J449 Chronic obstructive pulmonary disease, unspecified: Secondary | ICD-10-CM

## 2022-08-08 DIAGNOSIS — M0569 Rheumatoid arthritis of multiple sites with involvement of other organs and systems: Secondary | ICD-10-CM | POA: Diagnosis not present

## 2022-08-08 DIAGNOSIS — I272 Pulmonary hypertension, unspecified: Secondary | ICD-10-CM | POA: Diagnosis not present

## 2022-08-08 DIAGNOSIS — J9611 Chronic respiratory failure with hypoxia: Secondary | ICD-10-CM | POA: Diagnosis not present

## 2022-08-08 DIAGNOSIS — R64 Cachexia: Secondary | ICD-10-CM

## 2022-08-08 NOTE — Patient Instructions (Signed)
Your lungs sounded good today.  Continue your medications as you are doing.  We will see you in follow-up in 4 months time call sooner should any new problems arise.

## 2022-08-08 NOTE — Progress Notes (Addendum)
Subjective:    Patient ID: Carrie Hendricks, female    DOB: August 19, 1950, 72 y.o.   MRN: [id number] Patient Care Team: Enid Baas, MD as PCP - General (Internal Medicine) Phil Dopp, [facility] as Pharmacist (Pharmacist) [name], MD as Consulting Physician (Gastroenterology)  Chief Complaint  Patient presents with   Follow-up    SOB with exertion. No wheezing or cough.    HPI Carrie Hendricks presents for follow-up.  We last saw her on 08 April 2022.  As noted she is a 72 year old former smoker with very severe COPD and chronic hypoxic respiratory failure.  She has moderate pulmonary hypertension.  Pulmonary hypertension has improved with supplemental oxygen.  She is compliant with Bevespi therapy and with supplemental oxygen.  She has not had recent exacerbations.  At her prior visit we had to switch her from [place] to [place] due to issues with recurrent thrush with the ICS component.   She has not had any fevers, chills or sweats.  No cough over her usual, sputum has not changed in character.  No hemoptysis.  Continues to have "good days and bad days" she is compliant with her oxygen therapy at 2 L/min continuous or 2 L intermittent.  She does well with Bevespi and does not have to use rescue albuterol except very rarely.   Recall that she has pretty significant rheumatoid arthritis.  She just had repair of a closed Colles' fracture on the left radius.  She tolerated the surgery without difficulty.  She continues to be abstinent of cigarettes.  She does not endorse any other symptomatology.  Her joint pain is well controlled.  She is on Plaquenil, she follows with rheumatology at [facility].   DATA: 04/04/17 CXR: Severe hyperinflation and hyperlucency without acute findings 05/18/17 PFTs: Very severe obstruction, FEV1 0.96 L (38%), hyperinflation-TLC 6.92 L (127%), RV 4.45 L (208%), DLCO 4.8 (28%), DLCO/VA 1.35 (37%) 07/18/2019 ONO: Had overnight oximetry which shows saturations  as low as 78%, qualified for nocturnal O2, qualified also for daytime O2 but refuses daytime O2 07/24/2019 2D echo: Severe pulmonary hypertension noted (76 mmHg), grade 1 diastolic dysfunction noted, LVEF 60 to 65% 11/13/2019 PFT: FEV1 0.70 L or 28% predicted, FVC 1.88 L or 58% predicted there is hyperinflation with TLC at 134% predicted and air trapping with RV at 215% predicted.  Diffusion capacity was severely reduced at 16%, consistent with very severe COPD 10/31/2019 chest x-ray: Hyperinflation and emphysematous lung disease without active disease 05/17/2020 alpha-1: Phenotype MM, normal.  Level 127 mg/dL 78/29/5621 2D echo: LVEF 60 to 65%.  Grade I DD, mild MR.  Right ventricular systolic pressure estimated 58.8 mmHg (previously 76 mmHg).  Review of Systems A 10 point review of systems was performed and it is as noted above otherwise negative.  Patient Active Problem List   Diagnosis Date Noted   [name] 09/03/2021   Medicare annual wellness visit, subsequent 11/04/2020   Chronic respiratory failure with hypoxia (HCC) 04/28/2020   Healthcare maintenance 04/28/2020   Elevated hemoglobin (HCC) 03/08/2019   COPD, very severe (HCC) 08/15/2018   Sleep disorder 10/10/2017   Screening mammogram, encounter for 09/12/2017   Ulcerative colitis (HCC) 02/01/2017   Lumbar degenerative disc disease 01/20/2017   Scoliosis 06/28/2016   Welcome to Medicare preventive visit 06/10/2016   Estrogen deficiency 06/10/2016   Constipation 03/02/2016   Underweight 03/02/2016   Shortness of breath 05/16/2014   History of COPD 05/16/2014   Stress reaction 05/16/2014   Connective tissue disease (HCC) 03/05/2014  Pedal edema 10/15/2013   Pulmonary hypertension (HCC) 10/10/2011   Encounter for routine gynecological examination 07/11/2011   HEMORRHOIDS, INTERNAL, WITH BLEEDING 03/15/2010   Essential hypertension 11/16/2006   Rheumatoid arthritis (HCC) 11/16/2006   Osteoporosis 11/16/2006   Social  History   Tobacco Use   Smoking status: Former    Packs/day: 1.25    Years: 30.00    Total pack years: 37.50    Types: Cigarettes    Quit date: 2006    Years since quitting: 18.1   Smokeless tobacco: Never   Tobacco comments:    Quit in 8/07  Substance Use Topics   Alcohol use: No    Alcohol/week: 0.0 standard drinks of alcohol   No Known Allergies  Current Meds  Medication Sig   albuterol (VENTOLIN HFA) 108 (90 Base) MCG/ACT inhaler INHALE 2 PUFFS INTO THE LUNGS EVERY 6 HOURS AS NEEDED FOR WHEEZING OR SHORTNESS OF BREATH.   amLODipine (NORVASC) 5 MG tablet Take 1 tablet (5 mg total) by mouth daily.   Aspirin 81 MG EC tablet Take 81 mg by mouth daily.   B Complex Vitamins (VITAMIN-B COMPLEX) TABS Take 1 tablet by mouth daily.   Cholecalciferol (VITAMIN D3) 1.25 MG (50000 UT) TABS    etanercept (ENBREL) 50 MG/ML injection Inject 50 mg into the skin once a week.   Glycopyrrolate-Formoterol (BEVESPI AEROSPHERE) 9-4.8 MCG/ACT AERO Inhale 2 puffs into the lungs 2 (two) times daily.   hydrochlorothiazide (HYDRODIURIL) 25 MG tablet Take 1 tablet (25 mg total) by mouth daily.   hydroxychloroquine (PLAQUENIL) 200 MG tablet Take 200 mg by mouth daily.   Multiple Vitamin (MULTIVITAMIN) capsule Take 1 capsule by mouth daily.   OXYGEN Inhale 2 L into the lungs as needed.   sulfaSALAzine (AZULFIDINE) 500 MG tablet Take 1 tablet (500 mg total) by mouth 3 (three) times daily.   Immunization History  Administered Date(s) Administered   Fluad Quad(high Dose 65+) 03/08/2019, 04/17/2020   Hep A / Hep B 11/03/2016, 12/06/2016   Hepatitis B, adult 05/09/2017   Influenza Split 04/03/2011   Influenza Whole 04/03/2010   Influenza, High Dose Seasonal PF 04/08/2016, 03/31/2017, 04/21/2018   Influenza,inj,Quad PF,6+ Mos 03/05/2014   Influenza-Unspecified 04/03/2013, 03/05/2014, 05/03/2015, 04/17/2020, 04/08/2021, 04/17/2021, 04/01/2022   PFIZER Comirnaty(Gray Top)Covid-19 Tri-Sucrose Vaccine  08/15/2019, 09/05/2019, 04/17/2020   PFIZER(Purple Top)SARS-COV-2 Vaccination 08/15/2019, 09/05/2019, 04/17/2020, 01/05/2021   Pneumococcal Conjugate-13 06/10/2016   Pneumococcal Polysaccharide-23 03/28/2007, 10/15/2013, 08/15/2018   Td 06/11/2010   Unspecified SARS-COV-2 Vaccination 04/01/2022       Objective:   Physical Exam BP 138/78 (BP Location: Left Arm, Cuff Size: Normal)   Pulse 81   Ht 5\' 6"  (1.676 m)   Wt 105 lb 12.8 oz (48 kg)   SpO2 95%   BMI 17.08 kg/m   SpO2: 95 % O2 Device: Nasal cannula O2 Flow Rate (L/min): 2 L/min  Gen: Thin, well-developed, fully ambulatory.  Chronic use of accessories.  No overt tachypnea.  Comfortable with nasal cannula O2 HEENT: NCAT, sclerae anicteric,dry mucous membranes, no thrush.  No oral lesions. Neck: No JVD, trachea midline, no crepitus Thorax: Increased AP diameter, Hoover's sign present Lungs: breath sounds  distant throughout without wheezes nor other adventitious sounds Cardiovascular: RRR, no murmurs, distant tones Abdomen: Soft, nontender, normal BS Ext: without clubbing, cyanosis.  No pedal edema noted today.  She does have stigmata of rheumatoid arthritis on her hands/wrists Neuro: grossly intact Skin: Limited exam, no open lesions noted, she does have some ecchymoses/senile purpuras.  Healing  scar on left forearm      Assessment & Plan:     ICD-10-CM   1. COPD, very severe (HCC)  J44.9    Continue Bevespi Patient does not tolerate ICS due to thrush Continue as send albuterol    2. Chronic respiratory failure with hypoxia (HCC)  J96.11    Compliant with oxygen at 2 L/min Continue supplementation    3. Pulmonary hypertension (HCC)  I27.20    Issue adds complexity to her management Continue oxygen supplementation Due to Cor pulmonale/RA    4. Rheumatoid arthritis of multiple sites with involvement of other organs and systems [facility])  M05.69    This issue adds complexity to her management Follows with  rheumatology On Enbrel/Plaquenil    5. 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  She is to continue her medications as above.  Continue oxygen supplementation.  Will see her in follow-up in 4 months time she is to contact us prior to that time should any new difficulties arise.  Gailen Shelter, MD Advanced Bronchoscopy PCCM [facility]    *This note was dictated using voice recognition software/Dragon.  Despite best efforts to proofread, errors can occur which can change the meaning. Any transcriptional errors that result from this process are unintentional and may not be fully corrected at the time of dictation.

## 2022-08-31 ENCOUNTER — Other Ambulatory Visit
Admission: RE | Admit: 2022-08-31 | Discharge: 2022-08-31 | Disposition: A | Discharge status: Home / Self Care | Payer: Medicare HMO | Source: Ambulatory Visit | Attending: Pulmonary Disease | Admitting: Pulmonary Disease

## 2022-08-31 DIAGNOSIS — I272 Pulmonary hypertension, unspecified: Secondary | ICD-10-CM | POA: Diagnosis present

## 2022-08-31 LAB — D-DIMER, QUANTITATIVE: D-Dimer, Quant: <0.27 ug/mL-FEU (ref 0.00–0.50)

## 2022-09-02 ENCOUNTER — Other Ambulatory Visit: Payer: Self-pay

## 2022-09-02 DIAGNOSIS — J849 Interstitial pulmonary disease, unspecified: Secondary | ICD-10-CM

## 2022-09-05 ENCOUNTER — Ambulatory Visit
Admission: RE | Admit: 2022-09-05 | Discharge: 2022-09-05 | Disposition: A | Discharge status: Home / Self Care | Payer: Medicare HMO | Source: Ambulatory Visit | Attending: Pulmonary Disease | Admitting: Pulmonary Disease

## 2022-09-05 DIAGNOSIS — J849 Interstitial pulmonary disease, unspecified: Secondary | ICD-10-CM | POA: Insufficient documentation

## 2022-10-02 IMAGING — MR MR CERVICAL SPINE W/O CM
5 series · 35 of 48 positions shown · non-contrast
Comparison: Report from cervical spine radiographs 12/23/2020
(images unavailable).

CLINICAL DATA: Neck pain Z18.8 (ZAO-7Z-CM). DDD (degenerative disc
disease), cervical MEF.MF (ZAO-7Z-CM). Pain and numbness of upper
extremity M79.603, NW4.4 (ZAO-7Z-CM). Additional history provided by
scanning technologist: Patient reports neck pain which radiates down
right arm with numbness in hand for 2 months, possible weight
lifting injury.

EXAM:
MRI CERVICAL SPINE WITHOUT CONTRAST
TECHNIQUE: Multiplanar, multisequence MR imaging of the cervical spine was
performed. No intravenous contrast was administered.

[Series 5: T2 · sagittal · 3.0mm · 0.62mm/px · 6 of 15 slices shown (1 of 2)]
[im 1/15]
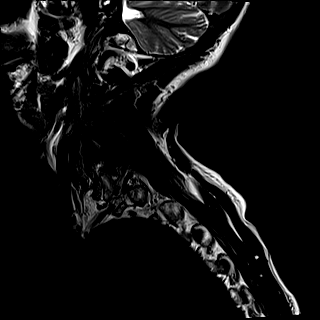
[im 3/15]
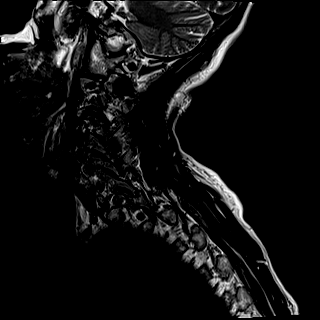
[im 6/15]
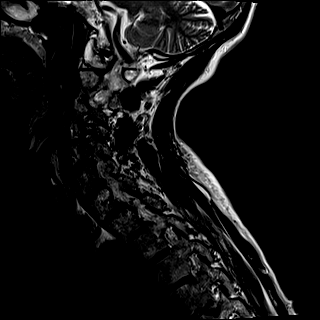
[im 9/15]
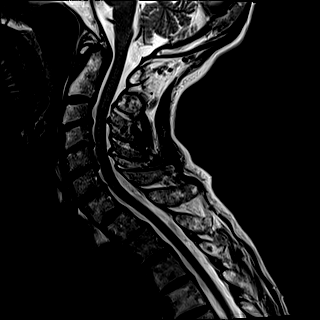
[im 12/15]
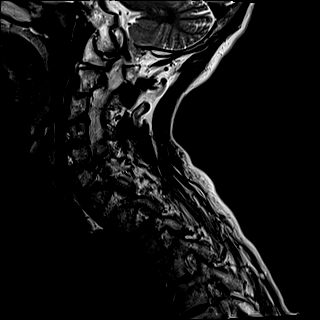
[im 15/15]
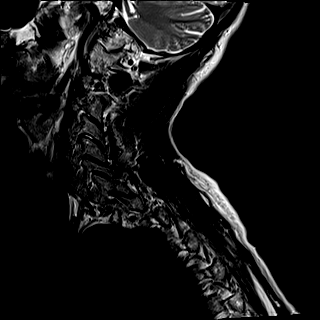

[Series 6: FLAIR · sagittal · 3.0mm · 0.78mm/px · 7 of 15 slices shown]
[im 1/15]
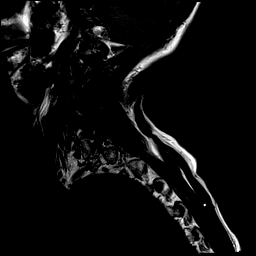
[im 3/15]
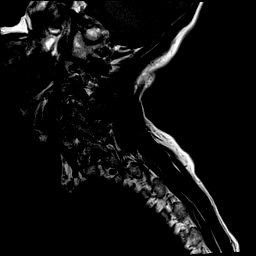
[im 5/15]
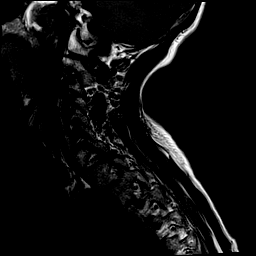
[im 8/15]
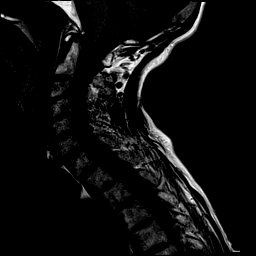
[im 10/15]
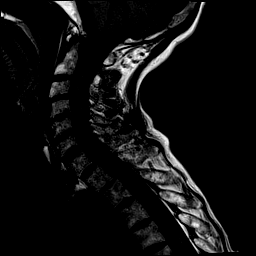
[im 12/15]
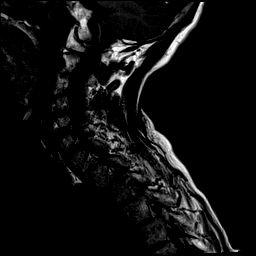
[im 15/15]
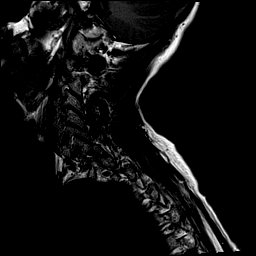

[Series 7: STIR · sagittal · 3.0mm · 0.62mm/px · 7 of 15 slices shown]
[im 1/15]
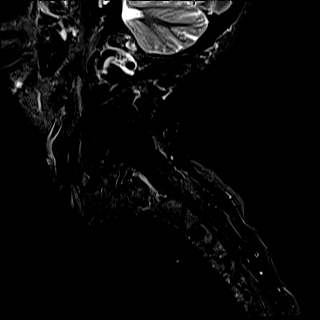
[im 3/15]
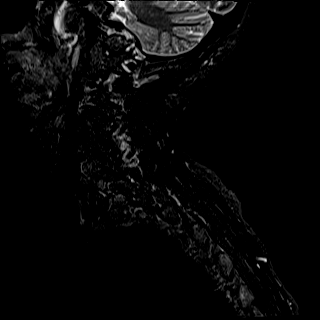
[im 5/15]
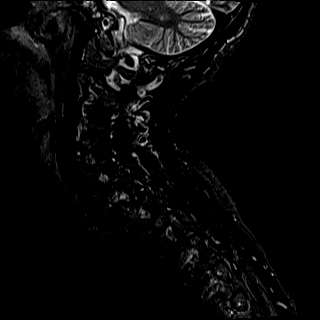
[im 8/15]
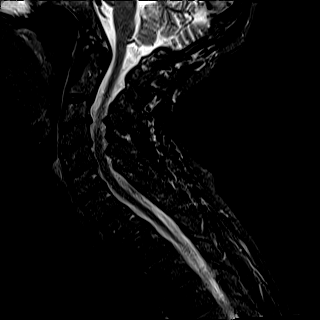
[im 10/15]
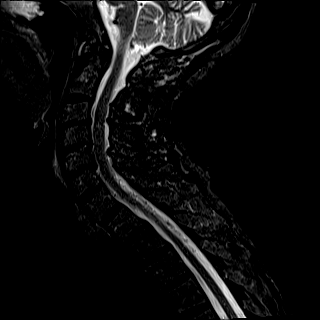
[im 12/15]
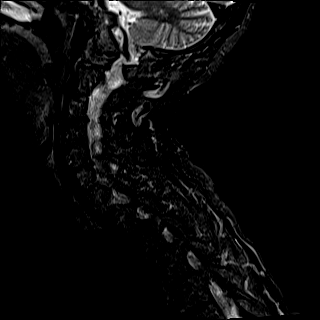
[im 15/15]
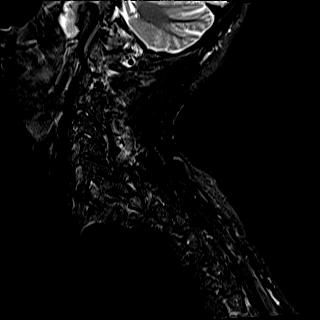

[Series 8: T2 · axial · 3.0mm · 0.70mm/px · z∈[-92,+5]mm · 8 of 29 slices shown (2 of 2)]
[im 1/29]
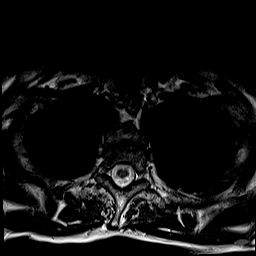
[im 5/29]
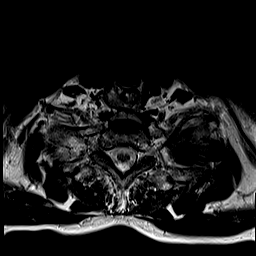
[im 9/29]
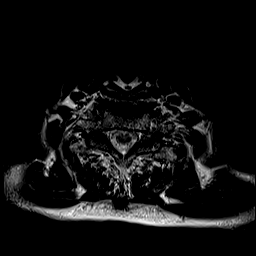
[im 13/29]
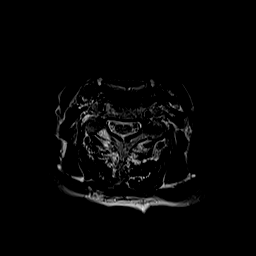
[im 16/29]
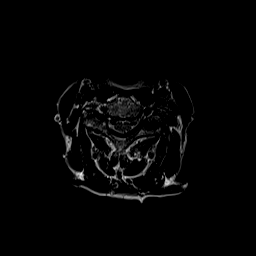
[im 20/29]
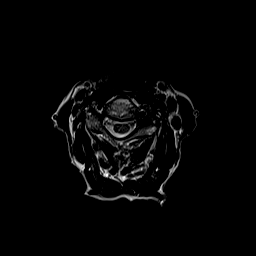
[im 24/29]
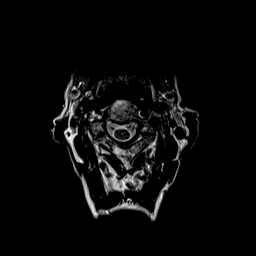
[im 29/29]
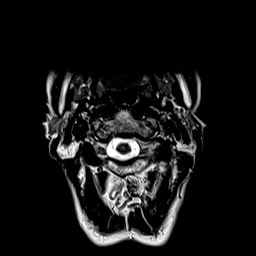

[Series 9: ax mpgr · axial · 3.0mm · 0.35mm/px · z∈[-92,-12]mm · 7 of 29 slices shown]
[im 1/29]
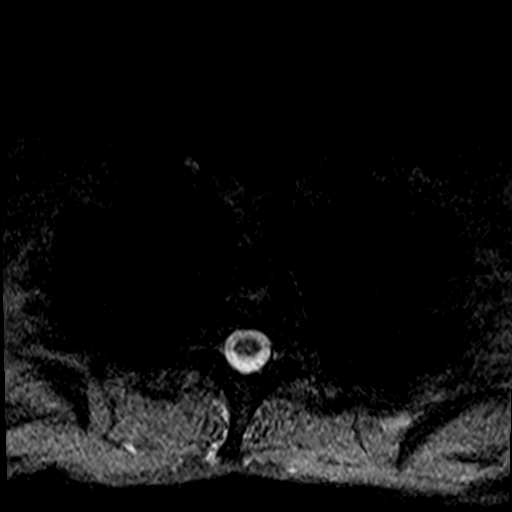
[im 5/29]
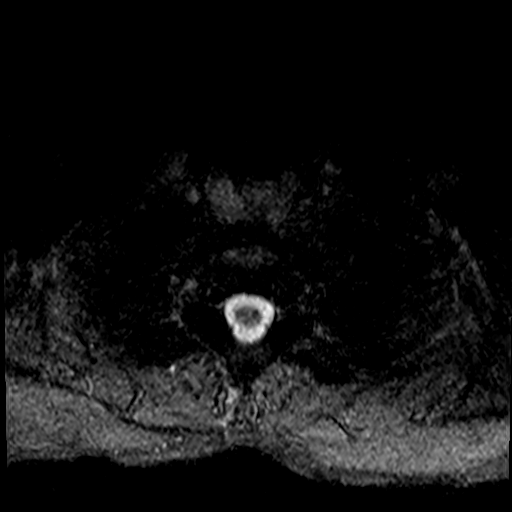
[im 9/29]
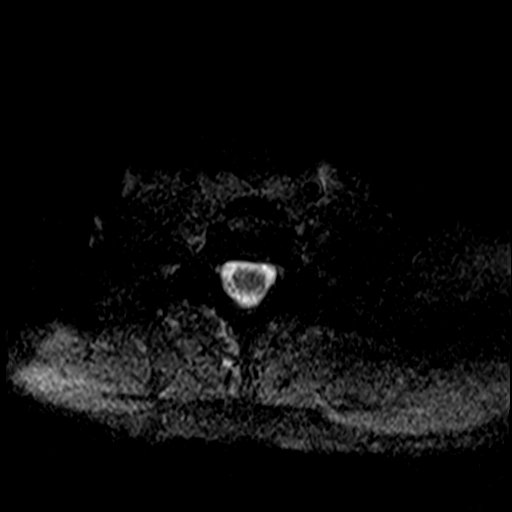
[im 13/29]
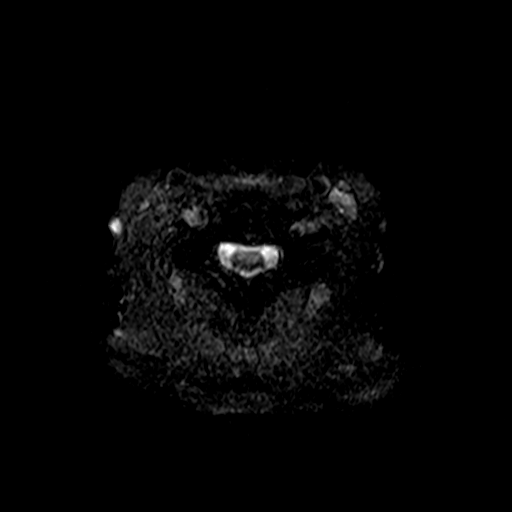
[im 16/29]
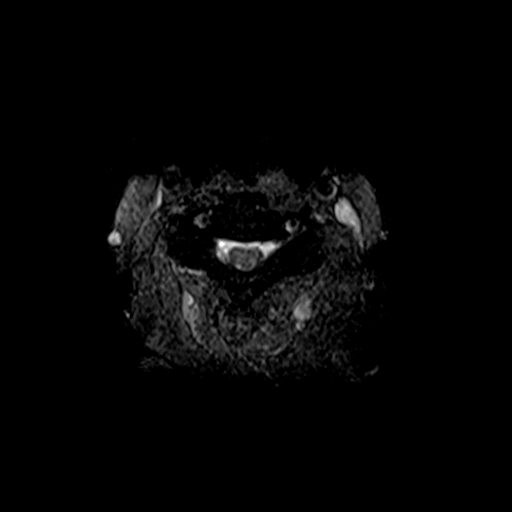
[im 20/29]
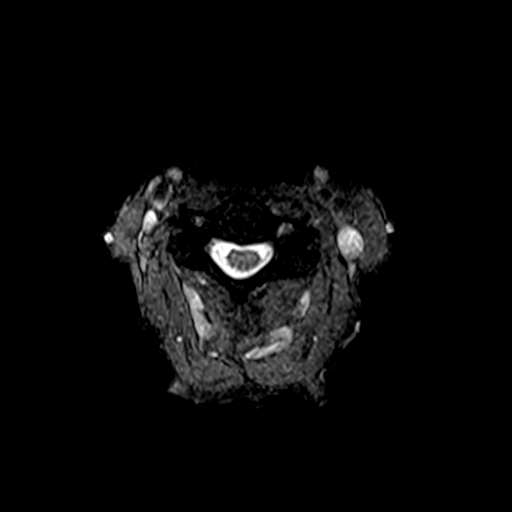
[im 24/29]
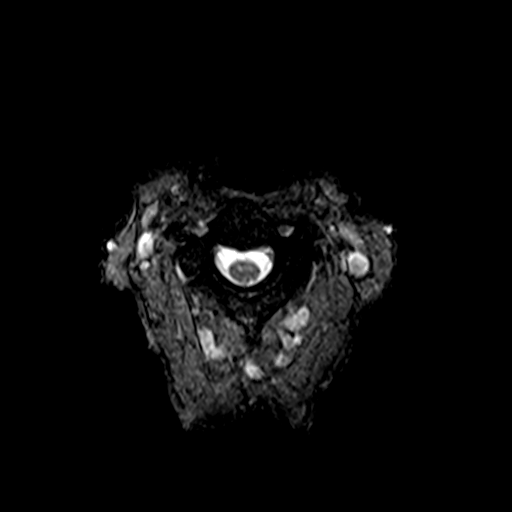

[35 of 48 positions shown; findings below may reference images not displayed]

FINDINGS: Alignment: Trace C2-C3, C4-C5 and C5-C6 grade 1 retrolisthesis.
Trace C7-T1 and T1-T2 grade 1 anterolisthesis.

Vertebrae: Vertebral body height is maintained. 10 mm focus of STIR
hyperintensity within the C2 spinous process (series 7, image 7). 7
mm STIR hyperintense focus within the right C6 inferior articular
process (series 7, image 3). Corresponding T1 hypointensity at these
sites. Elsewhere, no significant marrow edema or focal suspicious
osseous lesion is identified.

Cord: No spinal cord signal abnormality is identified.

Posterior Fossa, vertebral arteries, paraspinal tissues: No
abnormality identified within included portions of the posterior
fossa. Flow voids preserved within the imaged cervical vertebral
arteries. Paraspinal soft tissues within normal limits.

Disc levels:

Multilevel disc degeneration. Most notably, moderate disc
degeneration is present at C3-C4, C4-C5, C5-C6, C6-C7 and T1-T2.

C2-C3: Trace grade 1 retrolisthesis. Shallow disc bulge. No
significant spinal canal or foraminal stenosis.

C3-C4: Disc bulge with endplate spurring and bilateral disc
osteophyte ridge/uncinate hypertrophy. Mild facet arthrosis and
ligamentum flavum hypertrophy. Mild relative spinal canal narrowing
without spinal cord mass effect. Mild left neural foraminal
narrowing.

C4-C5: Posterior disc osteophyte complex with bilateral disc
osteophyte ridge/uncinate hypertrophy. Minimal facet arthrosis and
ligamentum flavum hypertrophy. Mild spinal canal stenosis with
contact upon the ventral spinal cord. Mild relative right neural
foraminal narrowing.

C5-C6: Trace grade 1 retrolisthesis. Posterior disc osteophyte
complex with bilateral disc osteophyte ridge/uncinate hypertrophy.
Facet arthrosis and ligamentum flavum hypertrophy. Mild spinal canal
stenosis with contact upon the dorsal and ventral spinal cord.
Bilateral neural foraminal narrowing (moderate/severe right,
moderate left).

C6-C7: Disc bulge with endplate spurring and bilateral disc
osteophyte ridge/uncinate hypertrophy. No significant spinal canal
stenosis. Mild relative bilateral neural foraminal narrowing.

C7-T1: Trace grade 1 anterolisthesis. Slight disc uncovering.
Minimal facet arthrosis. No significant spinal canal or foraminal
stenosis.

T1-T2: Trace grade 1 anterolisthesis. Slight disc uncovering and
disc bulge. No significant spinal canal or foraminal stenosis.

Impression #1 will be called to the ordering clinician or
representative by the Radiologist Assistant, and communication
documented in the PACS or [REDACTED].
IMPRESSION: 10 mm focus of STIR hyperintense signal abnormality within the C2
spinous process. 7 mm focus of STIR hyperintense signal abnormality
within the right C6 inferior articular process. While these lesions
may reflect atypical hemangiomas, alternative etiologies such as
osseous metastatic disease cannot be excluded. Consider short-term
2-4 month MRI follow-up to ensure stability, or consider further
evaluation with a nuclear medicine bone scan.

Cervical spondylosis, as outlined. No more than mild spinal canal
stenosis. Multilevel neural foraminal narrowing, as detailed and
greatest bilaterally at C5-C6 (moderate/severe right, moderate
left).

Disc degeneration is greatest at C3-C4, C4-C5, C5-C6, C6-C7 and
T1-T2 (moderate in severity at these levels).

## 2022-10-18 IMAGING — NM NM BONE WHOLE BODY
2 series · 10 of 10 positions shown · non-contrast
Comparison: None.

CLINICAL DATA: Neck and shoulder pain, low back pain

EXAM:
NUCLEAR MEDICINE WHOLE BODY BONE SCAN
TECHNIQUE: Whole body anterior and posterior images were obtained approximately
3 hours after intravenous injection of radiopharmaceutical.
RADIOPHARMACEUTICALS:  21.6 mCi Dechnetium-CCm MDP IV

[Series 1000: statics · 2.40mm/px · 4 acquisitions, 8 frames shown]
[im 1/4]
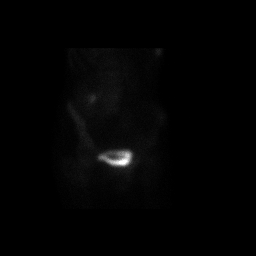
[im 1/4]
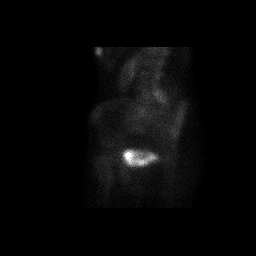
[im 2/4]
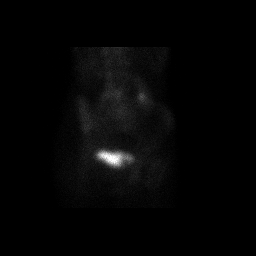
[im 2/4]
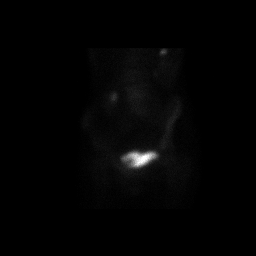
[im 3/4]
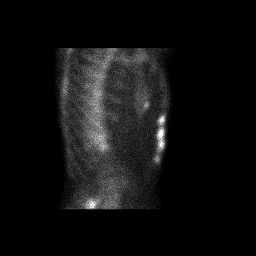
[im 3/4]
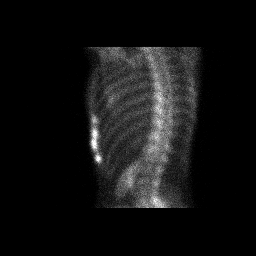
[im 4/4]
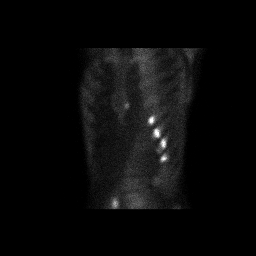
[im 4/4]
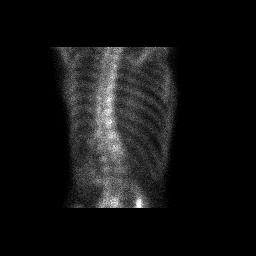

[Series 1000: 3 hr wholebody · 2.40mm/px · 2 of 2 frames shown]
[frame 1/2]
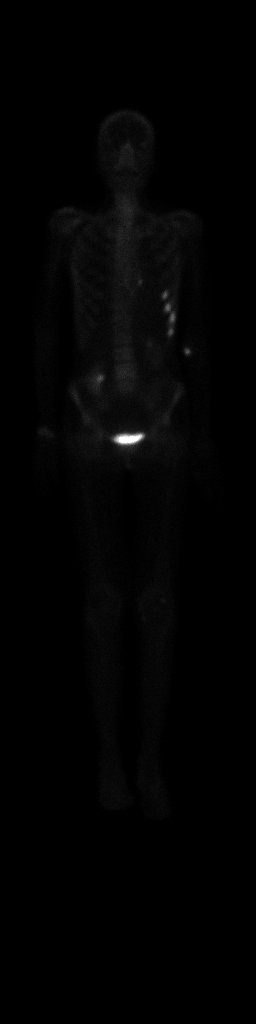
[frame 2/2]
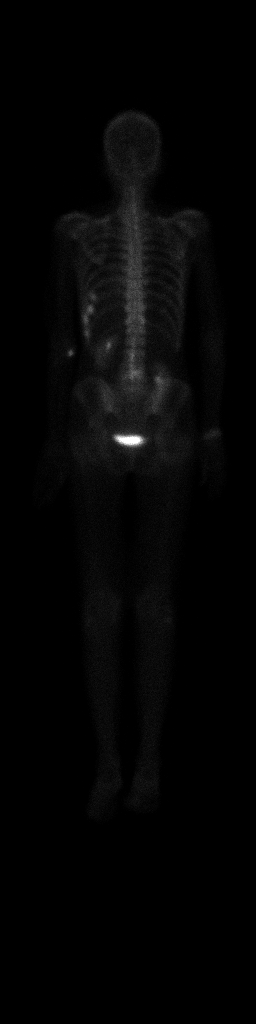

[10 of 10 positions shown; findings below may reference images not displayed]

FINDINGS: Linear arrangement of radiotracer uptake foci in the anterior left
fifth, sixth, seventh, and eighth ribs, as well as at the inferior
left aspect of the sternal body at the sternochondral junction.

Mild degenerative uptake of the cervical and lumbar spine,
shoulders, and right wrist.

Incidental note of a low lying, pelvic right kidney. Otherwise
expected urinary tract uptake and excretion.
IMPRESSION: 1. Linear arrangement of radiotracer uptake foci in the anterior
left fifth through eighth ribs, as well as at the inferior left
aspect of the sternal body at the sternochondral junction. Findings
are consistent with rib fractures. Correlate for history of recent
trauma and point tenderness.

2. Mild degenerative uptake of the cervical and lumbar spine,
shoulders, and right wrist.

3.  Incidental note of a low lying, pelvic right kidney.

## 2022-10-21 ENCOUNTER — Encounter: Payer: Self-pay | Admitting: Pulmonary Disease

## 2022-11-01 ENCOUNTER — Ambulatory Visit: Payer: Medicare HMO | Admitting: Gastroenterology

## 2022-11-01 ENCOUNTER — Encounter: Payer: Self-pay | Admitting: Gastroenterology

## 2022-11-01 VITALS — BP 134/76 | HR 83 | Temp 98.2°F | Ht 66.0 in | Wt 106.2 lb

## 2022-11-01 DIAGNOSIS — K5190 Ulcerative colitis, unspecified, without complications: Secondary | ICD-10-CM | POA: Diagnosis not present

## 2022-11-01 MED ORDER — SULFASALAZINE 500 MG PO TABS: 500.0000 mg | ORAL_TABLET | Freq: Three times a day (TID) | ORAL | 3 refills | Status: DC

## 2022-11-01 NOTE — Progress Notes (Signed)
[name] MD, MRCP(U.K) [address]  [address]  [place], [place] [address]  Main: [phone]  Fax: [phone]   Primary Care Physician: Enid Baas, MD  Primary Gastroenterologist:  Dr. [name]   Chief Complaint  Patient presents with   Follow-up    [name] is a 72 y.o. female Summary of history :   Is here to follow-up for ulcerative colitis pan colitis since 06/2016 .   Was seen and evaluated by Dr. 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   EGD 06/2016: biopsies showed active chronic gastritis with H pylori , H pylori stool antigen is negative 08/16/16    Colonoscopy 06/2016 : unable to perform colonoscopy as she had severe colitis on the left side of the colon - we had to abort the procedure at the 30 cm mark.Biopsies confirmed  Active colitis with chronicity    Colonoscopy :  3/3/0/18 and bx of the terminal ileum was normal but biopsies of the ascending colon , transverse , descending, sigmoid and rectum showed some areas of inflammation and areas with chronic inflammation changes.    Colonoscopy 12/2017 -single adenoma resected . Colon bx throughout the colon showed no active colitis.    07/2016: DEXA scan normal immune to hepatitis a and B.   Interval history 11/22/2021-11/01/2022  She is doing well from the GI point of view had 1 bowel movement a day.  No blood in the stool.  At times she does have constipation and takes prune juice which works well for her.  She follows up with rheumatology for her rheumatological issues.  Taking calcium and vitamin D.  She says she is on Fosamax.  Requires a refill of her sulfasalazine 500 mg 4 times daily..  She recently had an episode of shingles.  She has had a fall and cracked her rib. 09/21/2022: Hb 15.4 , LFT's normal in 08/2022     Uptodate with Hepatitis, Pneumococcal vaccine,  and flu shot       Current Outpatient Medications  Medication Sig Dispense Refill    albuterol (VENTOLIN HFA) 108 (90 Base) MCG/ACT inhaler INHALE 2 PUFFS INTO THE LUNGS EVERY 6 HOURS AS NEEDED FOR WHEEZING OR SHORTNESS OF BREATH. 54 g 0   amLODipine (NORVASC) 5 MG tablet Take 1 tablet (5 mg total) by mouth daily. 90 tablet 3   Aspirin 81 MG EC tablet Take 81 mg by mouth daily.     B Complex Vitamins (VITAMIN-B COMPLEX) TABS Take 1 tablet by mouth daily.     Cholecalciferol (VITAMIN D3) 1.25 MG (50000 UT) TABS      etanercept (ENBREL) 50 MG/ML injection Inject 50 mg into the skin once a week.     Glycopyrrolate-Formoterol (BEVESPI AEROSPHERE) 9-4.8 MCG/ACT AERO Inhale 2 puffs into the lungs 2 (two) times daily.     hydrochlorothiazide (HYDRODIURIL) 25 MG tablet Take 1 tablet (25 mg total) by mouth daily. 90 tablet 3   hydroxychloroquine (PLAQUENIL) 200 MG tablet Take 200 mg by mouth daily.     Multiple Vitamin (MULTIVITAMIN) capsule Take 1 capsule by mouth daily.     OXYGEN Inhale 2 L into the lungs as needed.     sulfaSALAzine (AZULFIDINE) 500 MG tablet Take 1 tablet (500 mg total) by mouth 3 (three) times daily.     No current facility-administered medications for this visit.    Allergies as of 11/01/2022   (No Known Allergies)  ROS:  General: Negative for anorexia, weight loss, fever, chills, fatigue, weakness. ENT: Negative for hoarseness, difficulty swallowing , nasal congestion. CV: Negative for chest pain, angina, palpitations, dyspnea on exertion, peripheral edema.  Respiratory: Negative for dyspnea at rest, dyspnea on exertion, cough, sputum, wheezing.  GI: See history of present illness. GU:  Negative for dysuria, hematuria, urinary incontinence, urinary frequency, nocturnal urination.  Endo: Negative for unusual weight change.    Physical Examination:   Ht 5\' 6"  (1.676 m)   BMI 17.08 kg/m   General: Well-nourished, well-developed in no acute distress.  Eyes: No icterus. Conjunctivae pink. Mouth: Oropharyngeal mucosa moist and pink , no lesions  erythema or exudate..  Skin: Warm and dry, no jaundice.   Psych: Alert and cooperative, normal mood and affect.   Imaging Studies: No results found.  Assessment and Plan:   Carrie Hendricks is a 72 y.o. y/o female   here to follow up for Ulcerative colitis: Clinically and biochemically in remission.  Presently on Fosamax, Enbrel prescribed by her rheumatologist.  Up-to-date with her vaccination.   Plan 1.  CBC,CMP every 4-5 months with PCP to monitor since on sulfasalazine and occasionally can cause agranulocytosis.  2.  Refill of sulfasalazine provided    Dr [name]  MD,MRCP [facility]) Follow up in 1 year or sooner if symptoms recur

## 2022-11-04 ENCOUNTER — Telehealth: Payer: Self-pay | Admitting: Gastroenterology

## 2022-11-04 NOTE — Telephone Encounter (Signed)
Pt left vm to get prespription refill for sulsasalazine 500 mg  pharmacy [facility] please return call

## 2022-11-08 ENCOUNTER — Encounter: Payer: Self-pay | Admitting: Gastroenterology

## 2022-11-08 ENCOUNTER — Other Ambulatory Visit: Payer: Self-pay

## 2022-11-08 MED ORDER — SULFASALAZINE 500 MG PO TABS: 500.0000 mg | ORAL_TABLET | Freq: Three times a day (TID) | ORAL | 3 refills | Status: DC

## 2022-11-09 NOTE — Telephone Encounter (Signed)
Patient was contacted and was informed that her medication was sent to the correct location ([facility]) and that they shipped it yesterday.

## 2023-07-18 ENCOUNTER — Other Ambulatory Visit: Payer: Self-pay | Admitting: Internal Medicine

## 2023-07-18 DIAGNOSIS — Z1231 Encounter for screening mammogram for malignant neoplasm of breast: Secondary | ICD-10-CM

## 2023-08-11 ENCOUNTER — Telehealth: Payer: Self-pay | Admitting: Gastroenterology

## 2023-08-11 NOTE — Telephone Encounter (Signed)
 The patient called in to schedule a follow up appointment.

## 2023-09-05 ENCOUNTER — Other Ambulatory Visit: Payer: Self-pay | Admitting: Internal Medicine

## 2023-09-05 DIAGNOSIS — R091 Pleurisy: Secondary | ICD-10-CM

## 2023-09-06 ENCOUNTER — Other Ambulatory Visit: Payer: Self-pay | Admitting: Internal Medicine

## 2023-09-06 DIAGNOSIS — R1319 Other dysphagia: Secondary | ICD-10-CM

## 2023-09-06 DIAGNOSIS — R091 Pleurisy: Secondary | ICD-10-CM

## 2023-09-11 ENCOUNTER — Ambulatory Visit
Admission: RE | Admit: 2023-09-11 | Discharge: 2023-09-11 | Disposition: A | Discharge status: Home / Self Care | Source: Ambulatory Visit | Attending: Internal Medicine | Admitting: Internal Medicine

## 2023-09-11 DIAGNOSIS — R091 Pleurisy: Secondary | ICD-10-CM

## 2023-09-11 DIAGNOSIS — R1319 Other dysphagia: Secondary | ICD-10-CM

## 2023-09-15 ENCOUNTER — Ambulatory Visit
Admission: RE | Admit: 2023-09-15 | Discharge: 2023-09-15 | Disposition: A | Discharge status: Home / Self Care | Source: Ambulatory Visit | Attending: Internal Medicine | Admitting: Internal Medicine

## 2023-09-15 ENCOUNTER — Other Ambulatory Visit: Payer: Self-pay | Admitting: Internal Medicine

## 2023-09-15 DIAGNOSIS — C786 Secondary malignant neoplasm of retroperitoneum and peritoneum: Secondary | ICD-10-CM

## 2023-09-15 MED ORDER — IOHEXOL 300 MG/ML  SOLN
80.0000 mL | Freq: Once | INTRAMUSCULAR | Status: AC | PRN
  Administered 2023-09-15: 80 mL via INTRAVENOUS

## 2023-09-21 ENCOUNTER — Ambulatory Visit: Payer: Medicare HMO | Admitting: Gastroenterology

## 2023-09-21 VITALS — BP 149/85 | HR 90 | Temp 98.7°F | Wt 106.0 lb

## 2023-09-21 DIAGNOSIS — K5190 Ulcerative colitis, unspecified, without complications: Secondary | ICD-10-CM

## 2023-09-21 DIAGNOSIS — R1310 Dysphagia, unspecified: Secondary | ICD-10-CM | POA: Diagnosis not present

## 2023-09-21 DIAGNOSIS — R935 Abnormal findings on diagnostic imaging of other abdominal regions, including retroperitoneum: Secondary | ICD-10-CM

## 2023-09-21 MED ORDER — SULFASALAZINE 500 MG PO TABS: 500.0000 mg | ORAL_TABLET | Freq: Three times a day (TID) | ORAL | 3 refills | Status: AC

## 2023-09-21 NOTE — Progress Notes (Unsigned)
 [name] MD, MRCP(U.K) [address]  [address]  [place], [place] [address]  Main: [phone]  Fax: [phone]   Primary Care Physician: Enid Baas, MD  Primary Gastroenterologist:  Carrie Hendricks   Chief Complaint  Patient presents with   Ulcerative Colitis    [name] is a 73 y.o. female Summary of history :   Is here to follow-up for ulcerative colitis pan colitis since 06/2016 .   Was seen and evaluated by Dr. Smith Hendricks and was determined to have secondary polycythemia due to COPD.   EGD 06/2016: biopsies showed active chronic gastritis with H pylori , H pylori stool antigen is negative 08/16/16    Colonoscopy 06/2016 : unable to perform colonoscopy as she had severe colitis on the left side of the colon - we had to abort the procedure at the 30 cm mark.Biopsies confirmed  Active colitis with chronicity    Colonoscopy :  3/3/0/18 and bx of the terminal ileum was normal but biopsies of the ascending colon , transverse , descending, sigmoid and rectum showed some areas of inflammation and areas with chronic inflammation changes.    Colonoscopy 12/2017 -single adenoma resected . Colon bx throughout the colon showed no active colitis.    07/2016: DEXA scan normal immune to hepatitis a and B.   Interval history 11/01/2022-09/21/2023   09/15/2023: CT abdomen : No acute abnormalities  08/10/2023: Hb 15.6 , WCC 4.5, cr 0.7 , LFT's normal .    She has interstitial lung disease on oxygen long-term can walk only about 5-6 steps before she gets short of breath accompanied by her daughters no GI symptoms from her IBD is rather constipated at times doing well on sulfasalazine no rectal bleeding no nocturnal symptoms she has had some issues with swallowing of tablets but she also complains of a dry mouth recent barium swallow on 09/11/2023 showed that the tablet remained at the level of the distal esophagus for 5 minutes concerning for distal esophageal stricture and  recommend endoscopy.  Current Outpatient Medications  Medication Sig Dispense Refill   albuterol (VENTOLIN HFA) 108 (90 Base) MCG/ACT inhaler INHALE 2 PUFFS INTO THE LUNGS EVERY 6 HOURS AS NEEDED FOR WHEEZING OR SHORTNESS OF BREATH. 54 g 0   amLODipine (NORVASC) 5 MG tablet Take 1 tablet (5 mg total) by mouth daily. 90 tablet 3   Aspirin 81 MG EC tablet Take 81 mg by mouth daily.     B Complex Vitamins (VITAMIN-B COMPLEX) TABS Take 1 tablet by mouth daily.     Cholecalciferol (VITAMIN D3) 1.25 MG (50000 UT) TABS      etanercept (ENBREL) 50 MG/ML injection Inject 50 mg into the skin once a week.     Glycopyrrolate-Formoterol (BEVESPI AEROSPHERE) 9-4.8 MCG/ACT AERO Inhale 2 puffs into the lungs 2 (two) times daily.     hydrochlorothiazide (HYDRODIURIL) 25 MG tablet Take 1 tablet (25 mg total) by mouth daily. 90 tablet 3   hydroxychloroquine (PLAQUENIL) 200 MG tablet Take 200 mg by mouth daily.     Multiple Vitamin (MULTIVITAMIN) capsule Take 1 capsule by mouth daily.     OXYGEN Inhale 2 L into the lungs as needed.     sulfaSALAzine (AZULFIDINE) 500 MG tablet Take 1 tablet (500 mg total) by mouth 3 (three) times daily. 270 tablet 3   No current facility-administered medications for this visit.    Allergies as of 09/21/2023   (No Known Allergies)      ROS:  General: Negative for anorexia, weight loss, fever, chills, fatigue, weakness. ENT: Negative for hoarseness, difficulty swallowing , nasal congestion. CV: Negative for chest pain, angina, palpitations, dyspnea on exertion, peripheral edema.  Respiratory: Negative for dyspnea at rest, dyspnea on exertion, cough, sputum, wheezing.  GI: See history of present illness. GU:  Negative for dysuria, hematuria, urinary incontinence, urinary frequency, nocturnal urination.  Endo: Negative for unusual weight change.    Physical Examination:   BP (!) 160/93   Pulse 90   Temp 98.7 F (37.1 C) (Oral)   Wt 106 lb (48.1 kg)   BMI 17.11  kg/m   General: Appears very thin cachectic Eyes: No icterus. Conjunctivae pink. Neuro: Alert and oriented x 3.  Grossly intact. Skin: Warm and dry, no jaundice.   Psych: Alert and cooperative, normal Hendricks and affect.   Imaging Studies: CT ABDOMEN PELVIS W CONTRAST Result Date: 09/15/2023 CLINICAL DATA:  Peritoneal carcinomatosis.  * Tracking Code: BO * EXAM: CT ABDOMEN AND PELVIS WITH CONTRAST TECHNIQUE: Multidetector CT imaging of the abdomen and pelvis was performed using the standard protocol following bolus administration of intravenous contrast. RADIATION DOSE REDUCTION: This exam was performed according to the departmental dose-optimization program which includes automated exposure control, adjustment of the mA and/or kV according to patient size and/or use of iterative reconstruction technique. CONTRAST:  80mL OMNIPAQUE IOHEXOL 300 MG/ML  SOLN COMPARISON:  None Available. FINDINGS: Lower chest: Severe centrilobular emphysema the lung bases. Hepatobiliary: Large benign 2.2 cm cyst in lateral segment RIGHT hepatic lobe. Several smaller lateral hepatic segment cysts are present. There is no intrahepatic duct dilatation. There is mild duct dilatation porta hepatis involving the LEFT and RIGHT central ducts. The common bile duct is not appear dilated through the pancreatic head. Gallbladder is not identified. Pancreas: Pancreas is normal. No ductal dilatation. No pancreatic inflammation. Spleen: Normal spleen Adrenals/urinary tract: Adrenal gland normal. Kidneys normal. Ureters normal. The bladder is not evaluated due to high-density barium within the LEFT colon and rectosigmoid colon. Stomach/Bowel: Stomach limited view of the bowel is unremarkable. There is dense barium LEFT colon and the rectosigmoid colon which obscures the entire pelvis within part of the LEFT abdomen. Vascular/Lymphatic: Abdominal aorta is normal caliber. No periportal or retroperitoneal adenopathy. No pelvic adenopathy.  Reproductive: Not evaluable Other: No evidence of peritoneal carcinomatosis. Musculoskeletal: No aggressive osseous lesion. IMPRESSION: 1. No evidence of peritoneal carcinomatosis. 2. Exam limited to due to high-density barium in the LEFT colon and rectosigmoid colon. 3. Central bile ducts dilated may relate to prior cholecystectomy. 4. Pancreas appears normal. Electronically Signed   By: Genevive Bi M.D.   On: 09/15/2023 19:15   CT CHEST WO CONTRAST Addendum Date: 09/15/2023 ADDENDUM REPORT: 09/15/2023 14:03 ADDENDUM: The following was discussed in the body of the report but not included in the impression section: T6 compression fracture is new in the interval age indeterminate. Electronically Signed   By: Leanna Battles M.D.   On: 09/15/2023 14:03   Result Date: 09/15/2023 CLINICAL DATA:  Left-sided lung discomfort with worsening shortness of breath on exertion. Rheumatoid arthritis. EXAM: CT CHEST WITHOUT CONTRAST TECHNIQUE: Multidetector CT imaging of the chest was performed following the standard protocol without IV contrast. RADIATION DOSE REDUCTION: This exam was performed according to the departmental dose-optimization program which includes automated exposure control, adjustment of the mA and/or kV according to patient size and/or use of iterative reconstruction technique. COMPARISON:  09/05/2022. FINDINGS: Cardiovascular: Atherosclerotic calcification of the aorta, aortic valve and coronary arteries. Heart size normal.  Trace pericardial effusion. Mediastinum/Nodes: Posterior right thyroid nodule measures 2.4 cm. No pathologically enlarged mediastinal or axillary lymph nodes. Hilar regions are difficult to definitively evaluate without IV contrast. Air in the esophagus can be seen with dysmotility. Lungs/Pleura: Severe centrilobular emphysema. Scattered pulmonary parenchymal scarring and calcified granulomas. No pleural fluid. Airway is unremarkable. Upper Abdomen: Probable hepatic cysts. Soft  tissue thickening and nodularity in the left lateral abdomen (2/171-178) Visualized portions of the liver, adrenal glands, kidneys, spleen, pancreas, stomach and bowel are otherwise grossly unremarkable. No upper abdominal adenopathy. Musculoskeletal: Degenerative changes in the spine. Pectus deformity. T6 compression fracture is new in the interval but age indeterminate. IMPRESSION: 1. No acute findings. 2. Soft tissue nodularity and thickening in the lateral left abdomen, worrisome for peritoneal carcinomatosis. Recommend CT abdomen pelvis with contrast in further evaluation. These results will be called to the ordering clinician or representative by the Radiologist Assistant, and communication documented in the PACS or [employer]. 3. Trace pericardial effusion. 4. 2.4 cm right thyroid nodule. Recommend thyroid ultrasound. (Ref: J Am Coll Radiol. 2015 Feb;12(2): 143-50). 5. Aortic atherosclerosis (ICD10-I70.0). Coronary artery calcification. 6.  Emphysema (ICD10-J43.9). Electronically Signed: By: Leanna Battles M.D. On: 09/15/2023 13:36   DG ESOPHAGUS W DOUBLE CM (HD) Result Date: 09/11/2023 CLINICAL DATA:  Provided history: Pleurisy. Esophageal dysphagia. Additional history: The patient reports dysphagia with solid foods, weight loss, history of COPD and GERD. EXAM: ESOPHAGUS/BARIUM SWALLOW/TABLET STUDY TECHNIQUE: A combined double and single contrast examination was performed using effervescent crystals, high-density barium, and thin liquid barium. Additionally, the patient swallowed a 13 mm barium tablet under fluoroscopy. The exam was performed by Anders Grant NP and was supervised and interpreted by Dr. Jackey Loge FLUOROSCOPY: Radiation Exposure Index (as provided by the fluoroscopic device): 4.80 mGy Kerma COMPARISON:  Same day chest CT 09/11/2023.  Chest CT 09/05/2022. FINDINGS: A swallowed 13 mm barium tablet was delayed at the distal esophagus. The tablet remained at this level for  approximately 5 minutes despite the patient taking additional swallows of water and barium contrast. Findings raise concern for a distal esophageal stricture. Questionable tiny ventral diverticulum of the cervical esophagus (possible Killian-Jolly diverticulum) (series 7, image 22). Esophagus otherwise normal in caliber and smooth in contour. Esophageal dysmotility with decreased peristalsis. No gastroesophageal reflux was observed. Impression #1 will be called to the ordering clinician or representative by the Radiologist Assistant, and communication documented in the PACS or [employer]. IMPRESSION: 1. A swallowed 13 mm barium tablet was delayed at the distal esophagus. The tablet remained at this level for approximately 5 minutes, despite the patient taking additional swallows of water and barium contrast. Findings raise concern for a distal esophageal stricture, and endoscopy should be considered for further evaluation. 2. Questionable tiny ventral diverticulum of the cervical esophagus (possible Killian-Sigg diverticulum). 3. Esophageal dysmotility with decreased peristalsis. Electronically Signed   By: Jackey Loge D.O.   On: 09/11/2023 11:39    Assessment and Plan:   SELIA WAREING is a 73 y.o. y/o female here to follow up for Ulcerative colitis: Clinically and biochemically in remission.  Presently on Fosamax, Enbrel prescribed by her rheumatologist.  Up-to-date with her vaccination.  She has a diagnosis of interstitial lung disease on long-term oxygen very poor exercise tolerance appears very thin cachectic with sarcopenia.  Recent onset of dysphagia to pills barium swallow does not show any mass CT scan of the chest also does not show any mediastinal abnormalities possible stricture versus dysmotility of the esophagus and  although not impossible small chunks of neoplasm is always possible at the GE junction.  Discussed the risks versus benefits of any endoscopy intervention would be  very high from the anesthesia point of view as she looks very frail and has poor respiratory function with exercise tolerance of 5-10 steps.  Discussed the options of not doing anything versus endoscopy with high risk associated.  She decided she will go home discuss with her family and Dr. Nemiah Commander and let us know about her decision   Plan 1.  CBC,CMP every 4-5 months with PCP to monitor since on sulfasalazine and occasionally can cause agranulocytosis.  2.  Refill of sulfasalazine provided    Dr [name]  MD,MRCP [facility]) Follow up in 8 months or sooner if has new symptoms

## 2023-12-01 ENCOUNTER — Emergency Department
Admission: EM | Admit: 2023-12-01 | Discharge: 2023-12-01 | Disposition: A | Discharge status: Home / Self Care | Attending: Emergency Medicine | Admitting: Emergency Medicine

## 2023-12-01 ENCOUNTER — Other Ambulatory Visit: Payer: Self-pay

## 2023-12-01 ENCOUNTER — Encounter: Payer: Self-pay | Admitting: Emergency Medicine

## 2023-12-01 DIAGNOSIS — J449 Chronic obstructive pulmonary disease, unspecified: Secondary | ICD-10-CM | POA: Insufficient documentation

## 2023-12-01 DIAGNOSIS — K642 Third degree hemorrhoids: Secondary | ICD-10-CM | POA: Diagnosis not present

## 2023-12-01 DIAGNOSIS — K649 Unspecified hemorrhoids: Secondary | ICD-10-CM | POA: Diagnosis present

## 2023-12-01 NOTE — ED Provider Notes (Signed)
 [facility] Provider Note    Event Date/Time   First MD Initiated Contact with Patient 12/01/23 [phone]     (approximate)   History   Hemorrhoids   HPI  Carrie Hendricks is a 73 y.o. female with history of osteoporosis, rheumatoid arthritis, pulmonary hypertension, connective tissue disease, COPD and as listed in EMR presents to the emergency department for treatment and evaluation of hemorrhoid after having a large bowel movement this morning.  She noted some streaks of blood on the toilet tissue when wiping.  She denies bright red blood in the toilet bowl.     Physical Exam   Triage Vital Signs: ED Triage Vitals  Encounter Vitals Group     BP 12/01/23 0808 (!) 159/104     Systolic BP Percentile --      Diastolic BP Percentile --      Pulse Rate 12/01/23 0808 96     Resp 12/01/23 0808 18     Temp 12/01/23 0808 98.1 F (36.7 C)     Temp Source 12/01/23 0808 Oral     SpO2 12/01/23 0808 92 %     Weight 12/01/23 0809 104 lb (47.2 kg)     Height 12/01/23 0809 5\' 7"  (1.702 m)     Head Circumference --      Peak Flow --      Pain Score 12/01/23 0809 0     Pain Loc --      Pain Education --      Exclude from Growth Chart --     Most recent vital signs: Vitals:   12/01/23 0808  BP: (!) 159/104  Pulse: 96  Resp: 18  Temp: 98.1 F (36.7 C)  SpO2: 92%    General: Awake, no distress.  CV:  Good peripheral perfusion.  Resp:  Normal effort.  Abd:  No distention.  Other:  Small, soft hemorrhoid without evidence of thrombosis.   ED Results / Procedures / Treatments   Labs (all labs ordered are listed, but only abnormal results are displayed) Labs Reviewed - No data to display   EKG     RADIOLOGY  Image and radiology report reviewed and interpreted by me. Radiology report consistent with the same.    PROCEDURES:  Critical Care performed: No  Procedures   MEDICATIONS ORDERED IN ED:  Medications - No data to  display   IMPRESSION / MDM / ASSESSMENT AND PLAN / ED COURSE   I have reviewed the triage note.  Differential diagnosis includes, but is not limited to, hemorrhoid, thrombosed hemorrhoid, rectal prolapse  Patient's presentation is most consistent with acute illness / injury with system symptoms.  73 year old female presenting to the emergency department for treatment and evaluation of possible hemorrhoid.  She was straining to have a bowel movement this morning and when she started to wipe she felt a bulge and noticed streaks of blood on the toilet tissue.  On exam, she does have a soft nonthrombosed hemorrhoid.  She was instructed to use over-the-counter witch hazel pads or hemorrhoid cream.  She was encouraged to see her primary care provider if symptoms change or worsen.       FINAL CLINICAL IMPRESSION(S) / ED DIAGNOSES   Final diagnoses:  Grade III hemorrhoids     Rx / DC Orders   ED Discharge Orders     None        Note:  This document was prepared using Dragon voice recognition software and may include  unintentional dictation errors.   Sherryle Don, FNP 12/01/23 1523    Shane Darling, MD 12/01/23 [phone]

## 2023-12-01 NOTE — ED Triage Notes (Signed)
 Patient to ED via POV for hemorrhoids. Pt reports having a large BM this AM and one popped out. Wears 2L [employer] at baseline.

## 2024-01-09 ENCOUNTER — Observation Stay
Admission: EM | Admit: 2024-01-09 | Discharge: 2024-01-10 | Disposition: A | Discharge status: Home / Self Care | Attending: Osteopathic Medicine | Admitting: Osteopathic Medicine

## 2024-01-09 DIAGNOSIS — R1319 Other dysphagia: Secondary | ICD-10-CM

## 2024-01-09 DIAGNOSIS — I10 Essential (primary) hypertension: Secondary | ICD-10-CM | POA: Diagnosis not present

## 2024-01-09 DIAGNOSIS — K51919 Ulcerative colitis, unspecified with unspecified complications: Secondary | ICD-10-CM | POA: Diagnosis not present

## 2024-01-09 DIAGNOSIS — Z79899 Other long term (current) drug therapy: Secondary | ICD-10-CM | POA: Diagnosis not present

## 2024-01-09 DIAGNOSIS — R634 Abnormal weight loss: Secondary | ICD-10-CM

## 2024-01-09 DIAGNOSIS — Z87891 Personal history of nicotine dependence: Secondary | ICD-10-CM | POA: Insufficient documentation

## 2024-01-09 DIAGNOSIS — G8929 Other chronic pain: Secondary | ICD-10-CM | POA: Insufficient documentation

## 2024-01-09 DIAGNOSIS — K5900 Constipation, unspecified: Secondary | ICD-10-CM | POA: Diagnosis not present

## 2024-01-09 DIAGNOSIS — R636 Underweight: Secondary | ICD-10-CM | POA: Insufficient documentation

## 2024-01-09 DIAGNOSIS — R1310 Dysphagia, unspecified: Secondary | ICD-10-CM | POA: Insufficient documentation

## 2024-01-09 DIAGNOSIS — K5190 Ulcerative colitis, unspecified, without complications: Secondary | ICD-10-CM | POA: Diagnosis present

## 2024-01-09 DIAGNOSIS — J449 Chronic obstructive pulmonary disease, unspecified: Secondary | ICD-10-CM | POA: Insufficient documentation

## 2024-01-09 DIAGNOSIS — J9611 Chronic respiratory failure with hypoxia: Secondary | ICD-10-CM | POA: Diagnosis not present

## 2024-01-09 DIAGNOSIS — R627 Adult failure to thrive: Secondary | ICD-10-CM | POA: Diagnosis not present

## 2024-01-09 DIAGNOSIS — Z7901 Long term (current) use of anticoagulants: Secondary | ICD-10-CM | POA: Insufficient documentation

## 2024-01-09 DIAGNOSIS — M069 Rheumatoid arthritis, unspecified: Secondary | ICD-10-CM | POA: Diagnosis not present

## 2024-01-09 DIAGNOSIS — I272 Pulmonary hypertension, unspecified: Secondary | ICD-10-CM | POA: Diagnosis not present

## 2024-01-09 DIAGNOSIS — R109 Unspecified abdominal pain: Secondary | ICD-10-CM | POA: Diagnosis not present

## 2024-01-09 DIAGNOSIS — R638 Other symptoms and signs concerning food and fluid intake: Secondary | ICD-10-CM

## 2024-01-09 LAB — CBC
HCT: 41.8 % (ref 36.0–46.0)
Hemoglobin: 14 g/dL (ref 12.0–15.0)
MCH: 34.7 pg — ABNORMAL HIGH (ref 26.0–34.0)
MCHC: 33.5 g/dL (ref 30.0–36.0)
MCV: 103.7 fL — ABNORMAL HIGH (ref 80.0–100.0)
Platelets: 242 K/uL (ref 150–400)
RBC: 4.03 MIL/uL (ref 3.87–5.11)
RDW: 11.7 % (ref 11.5–15.5)
WBC: 4.8 K/uL (ref 4.0–10.5)
nRBC: 0 % (ref 0.0–0.2)

## 2024-01-09 LAB — COMPREHENSIVE METABOLIC PANEL WITH GFR
ALT: 24 U/L (ref 0–44)
AST: 35 U/L (ref 15–41)
Albumin: 4.1 g/dL (ref 3.5–5.0)
Alkaline Phosphatase: 47 U/L (ref 38–126)
Anion gap: 11 (ref 5–15)
BUN: 11 mg/dL (ref 8–23)
CO2: 31 mmol/L (ref 22–32)
Calcium: 9.9 mg/dL (ref 8.9–10.3)
Chloride: 96 mmol/L — ABNORMAL LOW (ref 98–111)
Creatinine, Ser: 0.52 mg/dL (ref 0.44–1.00)
GFR, Estimated: >60 mL/min (ref >60)
Glucose, Bld: 103 mg/dL — ABNORMAL HIGH (ref 70–99)
Potassium: 4.1 mmol/L (ref 3.5–5.1)
Sodium: 138 mmol/L (ref 135–145)
Total Bilirubin: 1.3 mg/dL — ABNORMAL HIGH (ref 0.0–1.2)
Total Protein: 6.1 g/dL — ABNORMAL LOW (ref 6.5–8.1)

## 2024-01-09 LAB — URINALYSIS, ROUTINE W REFLEX MICROSCOPIC
Bilirubin Urine: NEGATIVE
Glucose, UA: NEGATIVE mg/dL
Hgb urine dipstick: NEGATIVE
Ketones, ur: 5 mg/dL — AB
Leukocytes,Ua: NEGATIVE
Nitrite: NEGATIVE
Protein, ur: NEGATIVE mg/dL
Specific Gravity, Urine: 1.021 (ref 1.005–1.030)
pH: 6 (ref 5.0–8.0)

## 2024-01-09 LAB — LIPASE, BLOOD: Lipase: 31 U/L (ref 11–51)

## 2024-01-09 MED ORDER — SULFASALAZINE 500 MG PO TBEC
500.0000 mg | DELAYED_RELEASE_TABLET | Freq: Three times a day (TID) | ORAL | Status: DC
  Administered 2024-01-10 (×2): 500 mg via ORAL
  Filled 2024-01-09 (×5): qty 1

## 2024-01-09 MED ORDER — ACETAMINOPHEN 325 MG PO TABS: 650.0000 mg | ORAL_TABLET | Freq: Four times a day (QID) | ORAL | Status: DC | PRN

## 2024-01-09 MED ORDER — ENOXAPARIN SODIUM 40 MG/0.4ML IJ SOSY: 40.0000 mg | PREFILLED_SYRINGE | INTRAMUSCULAR | Status: DC

## 2024-01-09 MED ORDER — ONDANSETRON HCL 4 MG PO TABS: 4.0000 mg | ORAL_TABLET | Freq: Four times a day (QID) | ORAL | Status: DC | PRN

## 2024-01-09 MED ORDER — ACETAMINOPHEN 650 MG RE SUPP
650.0000 mg | Freq: Four times a day (QID) | RECTAL | Status: DC | PRN
Start: 2024-01-09 — End: 2024-01-10

## 2024-01-09 MED ORDER — ONDANSETRON HCL 4 MG/2ML IJ SOLN: 4.0000 mg | Freq: Four times a day (QID) | INTRAMUSCULAR | Status: DC | PRN

## 2024-01-09 MED ORDER — ALBUTEROL SULFATE (2.5 MG/3ML) 0.083% IN NEBU: 2.5000 mg | INHALATION_SOLUTION | RESPIRATORY_TRACT | Status: DC | PRN

## 2024-01-09 MED ORDER — HYDROCHLOROTHIAZIDE 25 MG PO TABS: 25.0000 mg | ORAL_TABLET | Freq: Every day | ORAL | Status: DC

## 2024-01-09 MED ORDER — ENSURE PLUS HIGH PROTEIN PO LIQD
237.0000 mL | Freq: Three times a day (TID) | ORAL | Status: DC
  Administered 2024-01-09 – 2024-01-10 (×3): 237 mL via ORAL

## 2024-01-09 MED ORDER — PANTOPRAZOLE SODIUM 20 MG PO TBEC
20.0000 mg | DELAYED_RELEASE_TABLET | Freq: Every day | ORAL | Status: DC
  Administered 2024-01-09 – 2024-01-10 (×2): 20 mg via ORAL
  Filled 2024-01-09 (×3): qty 1

## 2024-01-09 MED ORDER — LACTATED RINGERS IV BOLUS
1000.0000 mL | Freq: Once | INTRAVENOUS | Status: AC
  Administered 2024-01-09: 1000 mL via INTRAVENOUS

## 2024-01-09 MED ORDER — HYDROCHLOROTHIAZIDE 25 MG PO TABS
25.0000 mg | ORAL_TABLET | Freq: Every day | ORAL | Status: DC
  Administered 2024-01-10: 25 mg via ORAL
  Filled 2024-01-09: qty 1

## 2024-01-09 MED ORDER — ASPIRIN 81 MG PO TBEC
81.0000 mg | DELAYED_RELEASE_TABLET | Freq: Every day | ORAL | Status: DC
  Administered 2024-01-10: 81 mg via ORAL
  Filled 2024-01-09: qty 1

## 2024-01-09 MED ORDER — SODIUM CHLORIDE 0.9 % IV SOLN: Freq: Once | INTRAVENOUS | Status: AC

## 2024-01-09 MED ORDER — DOCUSATE SODIUM 100 MG PO CAPS
100.0000 mg | ORAL_CAPSULE | Freq: Every day | ORAL | Status: DC
  Filled 2024-01-09: qty 1

## 2024-01-09 MED ORDER — HYDROXYCHLOROQUINE SULFATE 200 MG PO TABS
200.0000 mg | ORAL_TABLET | Freq: Every day | ORAL | Status: DC
  Administered 2024-01-10: 200 mg via ORAL
  Filled 2024-01-09: qty 1

## 2024-01-09 NOTE — H&P (Signed)
 History and Physical  Patient: Carrie Hendricks FMW:[id number] DOB: 1951/04/20 DOA: 01/09/2024 DOS: the patient was seen and examined on 01/09/2024 Patient coming from: Home  Chief Complaint:  First Nurse Note: Patient sent to ED by Dr Sherial for dehydration due to worsening dysphagia. Sent for IV fluids, admission and further GI evaluation. Also having worsening abd pain x4 months. Wears 2L at baseline. Hx of COPD and pulmonary HTN.  Refer to first nurse note. Daughters present at time of triage. Pt has a hx of ulcerative colitis. Reports abdominal pain (Optional):1} HPI: Patient with PMH of severe COPD, on 2-3 LPM, pulmonary hypertension, chronic respiratory failure, HTN, ulcerative colitis, rheumatoid arthritis, underweight presented to the hospital with complaints of difficulty swallowing, swelling and numbness in her legs and progressively worsening fatigue.  With regards to her difficulty swallowing patient reports that she started having this symptoms in February 2025.  Went to see PCP.   09/11/2023 underwent a barium swallow that showed evidence of possible stricture as barium tablet took 5 minutes to pass through the GE junction.  Also had a CT chest which was negative for any acute finding but showed a 2.4 cm thyroid  nodule and concern for peritoneal carcinomatosis. 09/15/2023 CT abdomen pelvis with contrast was performed which was negative for any evidence of primary malignancy or peritoneal carcinomatosis.  There was a benign-appearing liver cyst. 09/21/2023 patient was seen by Dr. Therisa.  Suspected possible stricture versus dysmotility.  Did discuss risk versus benefit of endoscopic intervention and patient preferred conservative approach.   09/29/2023 visit with PCP, family wanted to proceed with endoscopic procedure and referral were sent to [facility] and [facility].  Eventually was scheduled to undergo EGD with [facility] on 7/18.   7/3 preop evaluation with anesthesia, was recommended at the patient's  procedure to be rescheduled as inpatient versus outpatient given her severe COPD and currently scheduled for procedure somewhere in October. Went to PCP again and was referred to hospital for further evaluation of failure to thrive, dehydration and possible need for GI evaluation.  Her GI symptoms primarily starts after swallowing food as her stomach gets full with food and then she starts having abdominal pain and this leads to food aversion. Family has been using pured diet.  Patient drinks 1 Ensure a day. Per daughter for last 2 days patient has been crying at home saying she cannot wait until October for her procedure.  Of note she has severe COPD and pulmonary hypertension.  Has been chronically on steroids and possibly recently taken off of dexamethasone  in June 2025. Does not remember when was the last time she had her oxygen  level checked on ambulation. She has been dealing with thrush intermittently due to oral and inhaled steroids, and as of only 6 weeks ago she finally feels like she has been thrush free.  She has not taken any medicine for thrush.  Daughters are worried that she had a spot seen on her CT scan that needs more workup.  And for which she also needs EGD colonoscopy.  She also reports chronic fatigue which is progressively worsening with reduced exercise capacity.  She is afraid of ambulating due to shortness of breath and currently walks to the restroom and in the house only.  Has not had any home health or pulmonary rehab recently.  She has swelling of her legs for last 4 weeks as well as some numbness of her legs for last 4 weeks. she denies any calf tenderness.  Last B12 in our  system was 1 year ago value was more than 700.  She has ulcerative colitis and is compliant with all her medication.  She denies having any diarrhea and rather actually has some constipation.  Daughter reports that patient may have lost 2 pounds since February 2025. In chart review, her  weight in 2024 have been around 45 kg, went up to 48 kg in January 2025 and currently at 47 kg.  Assessment and Plan: Failure to thrive with dysphagia. Most likely esophageal dysmotility.  Possible esophageal stricture.  Differential also include esophageal candidiasis, decreased appetite after steroid removal, progression of ILD and pulmonary hypertension, mood disorder with somatic symptoms, IBS C, occult malignancy. History as above. Despite ongoing dysphagia since last 4 months with severe abdominal pain, and ongoing use of diuretic, patient is able to maintain electrolytes, serum creatinine without any evidence of acidemia and does not appear toxic. Peritoneal carcinomatosis have been ruled out based on the CT scan performed in March already. At present she remains at a higher risk for complication from interventions and anesthesia.  And indication for an inpatient procedure are not that strong. Will observe the patient overnight, allow her dysphagia to diet as well as ensure and monitor reoccurrence of the symptoms and recheck electrolytes. Will also consult dietitian. Add PPI. If there is any concern for ongoing malnutrition we will consult GI at that point otherwise would recommend to continue pursuing outpatient EGD colonoscopy as already scheduled. Family and patient were okay with this plan.  Fatigue. Severe COPD.  ILD Chronic respiratory failure on 2 through 3 LPM oxygen . Pulmonary hypertension Patient reports primarily exertional fatigue. Would recommend to check her ambulatory sats and monitor response. Will also recommend to check her echocardiogram to see the degree of pulmonary hypertension. Informed that ILD and COPD are catabolic condition and also progressive conditions and therefore anticipate further worsening down the road. Has been on chronic steroids and has been dealing with chronic thrush steroids have been tapered off in June and thrush was just treated around May  both of which can also cause dysphagia due to possibility of esophageal candidiasis, decreased appetite.  Lower extremity swelling with numbness. Currently no focal deficit at the time of my evaluation. Numbness likely secondary to swelling rather than neurological deficit. Will check B12 level. Continue multivitamins. No indication for imaging for now. Would recommend to discontinue Norvasc . Continue HCTZ. Discontinue IV fluid initiated in ED. Check echocardiogram.  Concern for mood disorder. May benefit from addition of medicine like Remeron for both appetite stimulation, treatment for mood disorder. For now monitor.  Regular arthritis. On antiviral and hydroxychloroquine . Will continue the same.  Ulcerative colitis. Current and active symptoms.  Last colonoscopy was also negative for any active colitis. Recently seen in the ED for hemorrhoids Continue sulfasalazine .  Continue aspirin .   Advance Care Planning:   Code Status: Limited: Do not attempt resuscitation (DNR) -DNR-LIMITED -Do Not Intubate/DNI discussed with patient and family. Consults: None  Prior to Admission medications   Medication Sig Start Date End Date Taking? Authorizing Provider  albuterol  (VENTOLIN  HFA) 108 (90 Base) MCG/ACT inhaler INHALE 2 PUFFS INTO THE LUNGS EVERY 6 HOURS AS NEEDED FOR WHEEZING OR SHORTNESS OF BREATH. 12/08/21  Yes Tamea Dedra CROME, MD  amLODipine  (NORVASC ) 5 MG tablet Take 1 tablet (5 mg total) by mouth daily. 11/04/20  Yes Tower, Laine LABOR, MD  Aspirin  81 MG EC tablet Take 81 mg by mouth daily.   Yes [provider]  etanercept (ENBREL) 50  MG/ML injection Inject 50 mg into the skin once a week.   Yes [provider]  Glycopyrrolate -Formoterol (BEVESPI AEROSPHERE) 9-4.8 MCG/ACT AERO Inhale 2 puffs into the lungs 2 (two) times daily.   Yes [provider]  hydrochlorothiazide  (HYDRODIURIL ) 25 MG tablet Take 1 tablet (25 mg total) by mouth daily. 11/04/20  Yes Tower,  Laine LABOR, MD  hydroxychloroquine  (PLAQUENIL ) 200 MG tablet Take 200 mg by mouth daily.   Yes [provider]  Multiple Vitamin (MULTIVITAMIN) capsule Take 1 capsule by mouth daily.   Yes [provider]  sulfaSALAzine  (AZULFIDINE ) 500 MG tablet Take 1 tablet (500 mg total) by mouth 3 (three) times daily. 09/21/23  Yes Therisa Bi, MD  B Complex Vitamins (VITAMIN-B COMPLEX) TABS Take 1 tablet by mouth daily. Patient not taking: Reported on 01/09/2024 07/13/22   [provider]  Cholecalciferol (VITAMIN D3) 1.25 MG (50000 UT) TABS  11/22/20   [provider]  OXYGEN  Inhale 2 L into the lungs as needed.    [provider]    Past Medical History:  Diagnosis Date   Arthritis    rheumatoid   Complication of anesthesia    COPD (chronic obstructive pulmonary disease) (HCC)    Hypertension    Osteoporosis    PONV (postoperative nausea and vomiting)    Pulmonary hypertension (HCC)    Rheumatoid arthritis(714.0)    ? scleroderma   Past Surgical History:  Procedure Laterality Date   colonosc neg  11/08   COLONOSCOPY WITH PROPOFOL  N/A 09/30/2016   Procedure: COLONOSCOPY WITH PROPOFOL ;  Surgeon: Bi Therisa, MD;  Location: [facility] ENDOSCOPY;  Service: Endoscopy;  Laterality: N/A;   COLONOSCOPY WITH PROPOFOL  N/A 12/08/2017   Procedure: COLONOSCOPY WITH PROPOFOL ;  Surgeon: Therisa Bi, MD;  Location: [facility] ENDOSCOPY;  Service: Gastroenterology;  Laterality: N/A;   Dexa  07/2005   OP hip- 3.0   ESOPHAGOGASTRODUODENOSCOPY (EGD) WITH PROPOFOL  N/A 07/01/2016   Procedure: ESOPHAGOGASTRODUODENOSCOPY (EGD) WITH PROPOFOL ;  Surgeon: Bi Therisa, MD;  Location: [facility] ENDOSCOPY;  Service: Endoscopy;  Laterality: N/A;   FLEXIBLE SIGMOIDOSCOPY N/A 07/01/2016   Procedure: FLEXIBLE SIGMOIDOSCOPY;  Surgeon: Bi Therisa, MD;  Location: [facility] ENDOSCOPY;  Service: Endoscopy;  Laterality: N/A;   LEEP  2005   OPEN REDUCTION INTERNAL FIXATION (ORIF) DISTAL RADIAL FRACTURE Left 05/03/2022    Procedure: OPEN REDUCTION INTERNAL FIXATION (ORIF) DISTAL RADIUS FRACTURE;  Surgeon: Edie Norleen PARAS, MD;  Location: [facility] ORS;  Service: Orthopedics;  Laterality: Left;   Surgery- tendon release in hand  2006   TUBAL LIGATION     Social History:  reports that she quit smoking about 19 years ago. Her smoking use included cigarettes. She started smoking about 49 years ago. She has a 37.5 pack-year smoking history. She has never used smokeless tobacco. She reports that she does not drink alcohol and does not use drugs. No Known Allergies Family History  Problem Relation Age of Onset   Heart disease Mother    Diabetes Mother    Hypertension Father    Macular degeneration Father    Dementia Father        early   Arthritis Father        RA   Alcohol abuse Father    Breast cancer Sister 2   Physical Exam: Vitals:   01/09/24 1020 01/09/24 1200 01/09/24 1230 01/09/24 1300  BP:  119/64 133/70 128/67  Pulse:  74 69 73  Resp:    17  Temp:      TempSrc:  SpO2:  100% 100% 100%  Weight: 47.2 kg     Height: 5' 7 (1.702 m)      General: Appear in mild distress; no visible Abnormal Neck Mass Or lumps, Conjunctiva normal Cardiovascular: S1 and S2 Present, no Murmur, Respiratory: good respiratory effort, Bilateral Air entry present and CTA, no Crackles, no wheezes Abdomen: Bowel Sound present, Non tender  Extremities: bilateral  Pedal edema Neurology: alert and oriented to time, place, and person Gait not checked due to patient safety concerns   Data Reviewed: I have reviewed ED notes, Vitals, Lab results and outpatient records. Since last encounter, pertinent lab results CBC and BMP   . I have ordered test including CBC and BMP  . I have discussed pt's care plan and test results with EDP  .   Family Communication: Both daughters at bedside  Author: Yetta Blanch, MD 01/09/2024 5:17 PM For on call review [email or web address].

## 2024-01-09 NOTE — ED Triage Notes (Signed)
 First Nurse Note: Patient sent to ED by Dr Sherial for dehydration due to worsening dysphagia. Sent for IV fluids, admission and further GI evaluation. Also having worsening abd pain x4 months. Wears 2L at baseline. Hx of COPD and pulmonary HTN.

## 2024-01-09 NOTE — ED Notes (Signed)
 Pt ambulated to the toilet in the room with minimal assistance from staff. Pt was visibly SOB upon returned to the bed and it took several minutes for the pt to get back to their baseline.

## 2024-01-09 NOTE — ED Notes (Signed)
 Pt placed in room by this tech and tech megan, fall bundle in place, family at bedside

## 2024-01-09 NOTE — ED Notes (Signed)
 Patient ambulated to restroom at this time with 1 assist

## 2024-01-09 NOTE — ED Triage Notes (Signed)
 Refer to first nurse note. Daughters present at time of triage. Pt has a hx of ulcerative colitis. Reports abdominal pain

## 2024-01-09 NOTE — ED Provider Notes (Signed)
 [facility] Provider Note    Event Date/Time   First MD Initiated Contact with Patient 01/09/24 1136     (approximate)   History   Failure To Thrive   HPI Carrie Hendricks is a 73 y.o. female whose medical history includes ulcerative colitis and COPD on supplemental oxygen  of 2 to 3 L at all times.  She presents to the emergency department with her 2 adult daughters for evaluation of failure to thrive.  She has been dealing with worsening abdominal pain, inability to tolerate oral intake, increased fatigue, increased generalized weakness, and generalized failure to thrive for the last several months.  Her primary care doctor is Dr. Sherial at [place] [facility].  She has had extensive outpatient workup that does not identify a specific area of concern although reportedly there was an unexplained or unidentified lesion on abdominal imaging that was of some concern.  She was waiting 4 months for outpatient endoscopy and colonoscopy at [facility] which was supposed to be on July 18.  Recently she had a preprocedural evaluation with anesthesia and they canceled the appointment, telling her that her lung disease is too severe to for her to be done as an outpatient.  They said that her next available appointment would not be for at least 3 or 4 months as an inpatient.  She and her daughters talked again with Dr. Sherial and she recommended that the patient come to the hospital for dehydration, IV fluids, and admission for additional workup.  The patient says she does not have trouble swallowing, she just cannot pass much food or liquid and that she only drinks about 2 cups of water a day.  She has been losing weight over an extended period of time.  She becomes so fatigued just getting out of bed that she can barely make it to the bathroom.  No recent falls.  No fever.  No chest pain.  She is always short of breath but is worse with fatigue.     Physical Exam   Triage Vital  Signs: ED Triage Vitals  Encounter Vitals Group     BP 01/09/24 1018 (!) 140/78     Girls Systolic BP Percentile --      Girls Diastolic BP Percentile --      Boys Systolic BP Percentile --      Boys Diastolic BP Percentile --      Pulse Rate 01/09/24 1018 81     Resp 01/09/24 1018 18     Temp 01/09/24 1018 98 F (36.7 C)     Temp Source 01/09/24 1018 Oral     SpO2 01/09/24 1018 97 %     Weight 01/09/24 1020 47.2 kg (104 lb)     Height 01/09/24 1020 1.702 m (5' 7)     Head Circumference --      Peak Flow --      Pain Score 01/09/24 1018 8     Pain Loc --      Pain Education --      Exclude from Growth Chart --     Most recent vital signs: Vitals:   01/09/24 1230 01/09/24 1300  BP: 133/70 128/67  Pulse: 69 73  Resp:  17  Temp:    SpO2: 100% 100%    General: Awake, alert.  Appears chronically ill but nontoxic. CV:  Good peripheral perfusion.  Regular rate and rhythm.  Normal heart sounds. Resp:  Normal effort. Speaking easily and comfortably, no accessory  muscle usage nor intercostal retractions.  Lungs clear to auscultation with decreased air movement throughout but with no wheezing, rales, nor rhonchi.  She is on 3 L supplemental oxygen  right now by nasal cannula and is comfortable. Abd:  No distention.  Has the appearance of recent weight loss.  Abdomen is soft.  She does not react to palpation of the abdomen but says that it hurts everywhere.  No palpable masses.  No guarding.   ED Results / Procedures / Treatments   Labs (all labs ordered are listed, but only abnormal results are displayed) Labs Reviewed  COMPREHENSIVE METABOLIC PANEL WITH GFR - Abnormal; Notable for the following components:      Result Value   Chloride 96 (*)    Glucose, Bld 103 (*)    Total Protein 6.1 (*)    Total Bilirubin 1.3 (*)    All other components within normal limits  CBC - Abnormal; Notable for the following components:   MCV 103.7 (*)    MCH 34.7 (*)    All other components  within normal limits  URINALYSIS, ROUTINE W REFLEX MICROSCOPIC - Abnormal; Notable for the following components:   Color, Urine AMBER (*)    APPearance CLEAR (*)    Ketones, ur 5 (*)    All other components within normal limits  LIPASE, BLOOD     PROCEDURES:  Critical Care performed: No  Procedures    IMPRESSION / MDM / ASSESSMENT AND PLAN / ED COURSE  I reviewed the triage vital signs and the nursing notes.                              Differential diagnosis includes, but is not limited to, failure to thrive, neoplasm, electrolyte abnormality, gastric motility issues, slow transit, obstructive process.  Patient's presentation is most consistent with acute presentation with potential threat to life or bodily function.  Labs/studies ordered: CMP, urinalysis, lipase, CBC  Interventions/Medications given:  Medications  lactated ringers  bolus 1,000 mL (0 mLs Intravenous Stopped 01/09/24 1348)  0.9 %  sodium chloride  infusion ( Intravenous New Bag/Given 01/09/24 1502)    (Note:  hospital course my include additional interventions and/or labs/studies not listed above.)   The patient has been suffering for an extended period of time but the symptoms do not seem to have gotten acutely worse.  Her vital signs are stable on her baseline supplemental oxygen .  Her lab work is surprisingly normal with no evidence of anemia, no leukocytosis, normal electrolytes, normal renal function for her size and age, and reassuring urinalysis.  I reviewed her medical record including the notes from Dr. Sherial and her CT scan from March.  He  Based on her abdominal exam today, I do not think she would benefit from a repeat CT scan given that she has no distention or suggestion of an SBO or ileus at this time although it would not be out of line to obtain a new scan.  However, I agree that given her extensive outpatient workup, the best thing for her would be to obtain an endoscopy and colonoscopy which  they have been trying to do but have as yet been unsuccessful.  I had a frank and honest conversation with the patient and her family about inpatient criteria, outpatient workup, etc.  They understand but are very concerned about her immediate health.  I consulted with the hospitalist team and I consulted in person with Dr. Yetta Blanch.  With the hospitalist service.  He very reasonably also commented on the patient's reassuring lab work and the chronic but worsening and worrisome nature of the patient's symptoms.  I encouraged him to see the patient and speak with the family to determine how we may be best able to assist them.  He is currently evaluating the patient in person in the emergency department.     Clinical Course as of 01/09/24 1548  Tue Jan 09, 2024  1531 Transferring ED care to Dr. Willo to follow-up with Dr. Tobie from the hospitalist service if he has any additional questions or concerns. [CF]    Clinical Course User Index [CF] Gordan Huxley, MD     FINAL CLINICAL IMPRESSION(S) / ED DIAGNOSES   Final diagnoses:  Failure to thrive in adult  Chronic abdominal pain  Ulcerative colitis with complication, unspecified location (HCC)  Unintentional weight loss  Unable to eat     Rx / DC Orders   ED Discharge Orders     None        Note:  This document was prepared using Dragon voice recognition software and may include unintentional dictation errors.   Gordan Huxley, MD 01/09/24 [id number]

## 2024-01-09 NOTE — Hospital Course (Signed)
 Patient with PMH of severe COPD, on 2-3 LPM, pulmonary hypertension, chronic respiratory failure, HTN, ulcerative colitis, rheumatoid arthritis, underweight presented to the hospital with complaints of difficulty swallowing, swelling and numbness in her legs and progressively worsening fatigue.  With regards to her difficulty swallowing patient reports that she started having this symptoms in February 2025.  Went to see PCP.   09/11/2023 underwent a barium swallow that showed evidence of possible stricture as barium tablet took 5 minutes to pass through the GE junction.  Also had a CT chest which was negative for any acute finding but showed a 2.4 cm thyroid  nodule and concern for peritoneal carcinomatosis. 09/15/2023 CT abdomen pelvis with contrast was performed which was negative for any evidence of primary malignancy or peritoneal carcinomatosis.  There was a benign-appearing liver cyst. 09/21/2023 patient was seen by Dr. Therisa.  Suspected possible stricture versus dysmotility.  Did discuss risk versus benefit of endoscopic intervention and patient preferred conservative approach.   09/29/2023 visit with PCP, family wanted to proceed with endoscopic procedure and referral were sent to [facility] and [facility].  Eventually was scheduled to undergo EGD with [facility] on 7/18.   7/3 preop evaluation with anesthesia, was recommended at the patient's procedure to be rescheduled as inpatient versus outpatient given her severe COPD and currently scheduled for procedure somewhere in October. Went to PCP again and was referred to hospital for further evaluation of failure to thrive, dehydration and possible need for GI evaluation.  Her GI symptoms primarily starts after swallowing food as her stomach gets full with food and then she starts having abdominal pain and this leads to food aversion. Family has been using pured diet.  Patient drinks 1 Ensure a day. Per daughter for last 2 days patient has been crying at home saying she  cannot wait until October for her procedure.  Of note she has severe COPD and pulmonary hypertension.  Has been chronically on steroids and possibly recently taken off of dexamethasone  in June 2025. Does not remember when was the last time she had her oxygen  level checked on ambulation. She has been dealing with thrush intermittently due to oral and inhaled steroids, and as of only 6 weeks ago she finally feels like she has been thrush free.  She has not taken any medicine for thrush.  Daughters are worried that she had a spot seen on her CT scan that needs more workup.  And for which she also needs EGD colonoscopy.  She also reports chronic fatigue which is progressively worsening with reduced exercise capacity.  She is afraid of ambulating due to shortness of breath and currently walks to the restroom and in the house only.  Has not had any home health or pulmonary rehab recently.  She has swelling of her legs for last 4 weeks as well as some numbness of her legs for last 4 weeks. she denies any calf tenderness.  Last B12 in our system was 1 year ago value was more than 700.  She has ulcerative colitis and is compliant with all her medication.  She denies having any diarrhea and rather actually has some constipation.  Daughter reports that patient may have lost 2 pounds since February 2025. In chart review, her weight in 2024 have been around 45 kg, went up to 48 kg in January 2025 and currently at 47 kg.  Assessment and Plan: Failure to thrive with dysphagia. Most likely esophageal dysmotility.  Possible esophageal stricture.  Differential also include esophageal candidiasis, decreased  appetite after steroid removal, progression of ILD and pulmonary hypertension, mood disorder with somatic symptoms, IBS C, occult malignancy. History as above. Despite ongoing dysphagia since last 4 months with severe abdominal pain, and ongoing use of diuretic, patient is able to maintain electrolytes, serum  creatinine without any evidence of acidemia and does not appear toxic. Peritoneal carcinomatosis have been ruled out based on the CT scan performed in March already. At present she remains at a higher risk for complication from interventions and anesthesia.  And indication for an inpatient procedure are not that strong. Will observe the patient overnight, allow her dysphagia to diet as well as ensure and monitor reoccurrence of the symptoms and recheck electrolytes. Will also consult dietitian. Add PPI. If there is any concern for ongoing malnutrition we will consult GI at that point otherwise would recommend to continue pursuing outpatient EGD colonoscopy as already scheduled. Family and patient were okay with this plan.  Fatigue. Severe COPD.  ILD Chronic respiratory failure on 2 through 3 LPM oxygen . Pulmonary hypertension Patient reports primarily exertional fatigue. Would recommend to check her ambulatory sats and monitor response. Will also recommend to check her echocardiogram to see the degree of pulmonary hypertension. Informed that ILD and COPD are catabolic condition and also progressive conditions and therefore anticipate further worsening down the road. Has been on chronic steroids and has been dealing with chronic thrush steroids have been tapered off in June and thrush was just treated around May both of which can also cause dysphagia due to possibility of esophageal candidiasis, decreased appetite.  Lower extremity swelling with numbness. Currently no focal deficit at the time of my evaluation. Numbness likely secondary to swelling rather than neurological deficit. Will check B12 level. Continue multivitamins. No indication for imaging for now. Would recommend to discontinue Norvasc . Continue HCTZ. Discontinue IV fluid initiated in ED. Check echocardiogram.  Concern for mood disorder. May benefit from addition of medicine like Remeron for both appetite stimulation,  treatment for mood disorder. For now monitor.  Regular arthritis. On antiviral and hydroxychloroquine . Will continue the same.  Ulcerative colitis. Current and active symptoms.  Last colonoscopy was also negative for any active colitis. Recently seen in the ED for hemorrhoids Continue sulfasalazine .  Continue aspirin .

## 2024-01-10 ENCOUNTER — Observation Stay
Admit: 2024-01-10 | Discharge: 2024-01-10 | Disposition: A | Discharge status: Home / Self Care | Attending: Internal Medicine | Admitting: Internal Medicine

## 2024-01-10 ENCOUNTER — Other Ambulatory Visit: Payer: Self-pay

## 2024-01-10 DIAGNOSIS — K224 Dyskinesia of esophagus: Secondary | ICD-10-CM

## 2024-01-10 DIAGNOSIS — K5900 Constipation, unspecified: Secondary | ICD-10-CM

## 2024-01-10 DIAGNOSIS — R1310 Dysphagia, unspecified: Secondary | ICD-10-CM | POA: Diagnosis not present

## 2024-01-10 DIAGNOSIS — R1319 Other dysphagia: Secondary | ICD-10-CM

## 2024-01-10 DIAGNOSIS — R140 Abdominal distension (gaseous): Secondary | ICD-10-CM

## 2024-01-10 DIAGNOSIS — R109 Unspecified abdominal pain: Secondary | ICD-10-CM

## 2024-01-10 DIAGNOSIS — R627 Adult failure to thrive: Secondary | ICD-10-CM

## 2024-01-10 LAB — BASIC METABOLIC PANEL WITH GFR
Anion gap: 6 (ref 5–15)
BUN: 8 mg/dL (ref 8–23)
CO2: 30 mmol/L (ref 22–32)
Calcium: 9 mg/dL (ref 8.9–10.3)
Chloride: 102 mmol/L (ref 98–111)
Creatinine, Ser: 0.43 mg/dL — ABNORMAL LOW (ref 0.44–1.00)
GFR, Estimated: >60 mL/min (ref >60)
Glucose, Bld: 78 mg/dL (ref 70–99)
Potassium: 3.2 mmol/L — ABNORMAL LOW (ref 3.5–5.1)
Sodium: 138 mmol/L (ref 135–145)

## 2024-01-10 LAB — ECHOCARDIOGRAM COMPLETE
Height: 67 in
Weight: 1664 [oz_av]

## 2024-01-10 LAB — CBC
HCT: 34.3 % — ABNORMAL LOW (ref 36.0–46.0)
Hemoglobin: 11.7 g/dL — ABNORMAL LOW (ref 12.0–15.0)
MCH: 35.1 pg — ABNORMAL HIGH (ref 26.0–34.0)
MCHC: 34.1 g/dL (ref 30.0–36.0)
MCV: 103 fL — ABNORMAL HIGH (ref 80.0–100.0)
Platelets: 182 K/uL (ref 150–400)
RBC: 3.33 MIL/uL — ABNORMAL LOW (ref 3.87–5.11)
RDW: 11.4 % — ABNORMAL LOW (ref 11.5–15.5)
WBC: 3.8 K/uL — ABNORMAL LOW (ref 4.0–10.5)
nRBC: 0 % (ref 0.0–0.2)

## 2024-01-10 LAB — VITAMIN B12: Vitamin B-12: 707 pg/mL (ref 180–914)

## 2024-01-10 LAB — T4, FREE: Free T4: 0.95 ng/dL (ref 0.61–1.12)

## 2024-01-10 LAB — TSH: TSH: 4.859 u[IU]/mL — ABNORMAL HIGH (ref 0.350–4.500)

## 2024-01-10 MED ORDER — PANTOPRAZOLE SODIUM 40 MG PO TBEC
40.0000 mg | DELAYED_RELEASE_TABLET | Freq: Every day | ORAL | 0 refills | Status: AC
Start: 2024-01-11 — End: ?
  Filled 2024-01-10: qty 30, 30d supply, fill #0

## 2024-01-10 MED ORDER — ONDANSETRON HCL 4 MG PO TABS
4.0000 mg | ORAL_TABLET | Freq: Four times a day (QID) | ORAL | 0 refills | Status: AC | PRN
  Filled 2024-01-10: qty 20, 5d supply, fill #0

## 2024-01-10 MED ORDER — FLUCONAZOLE 100 MG PO TABS
100.0000 mg | ORAL_TABLET | Freq: Every day | ORAL | 0 refills | Status: AC
  Filled 2024-01-10: qty 10, 10d supply, fill #0

## 2024-01-10 NOTE — Care Management Obs Status (Signed)
 MEDICARE OBSERVATION STATUS NOTIFICATION   Patient Details  Name: STARLEE CORRALEJO MRN: [id number] Date of Birth: [birth year]   Medicare Observation Status Notification Given:  Chaney BRANDY CHRISTIANE LELON, CMA 01/10/2024, 3:10 PM

## 2024-01-10 NOTE — Care Management Obs Status (Signed)
 MEDICARE OBSERVATION STATUS NOTIFICATION   Patient Details  Name: Carrie Hendricks MRN: [id number] Date of Birth: 01/26/1951   Medicare Observation Status Notification Given:       Damarion Mendizabal W, CMA 01/10/2024, 11:20 AM

## 2024-01-10 NOTE — Plan of Care (Signed)

## 2024-01-10 NOTE — Consult Note (Signed)
 Carrie Copping, MD [facility]  [address], [address] [place], [place] [address] Phone: [phone] Fax : [phone]  Consultation  Referring Provider:     Dr. Marsa Primary Care Physician:  Sherial Bail, MD Primary Gastroenterologist:  Dr. Therisa         Reason for Consultation:     Dysphagia and failure to thrive  Date of Admission:  01/09/2024 Date of Consultation:  01/10/2024         HPI:   Carrie Hendricks is a 73 y.o. female who has been seen in the past by Dr. Therisa for dysphagia.  At that time the patient had been recommended not to undergo an upper endoscopy due to being a high risk for any endoscopic procedures.  Subsequently the patient was seen at [facility] who also agree that the patient would be a high risk for endoscopic procedure and set the patient up to be admitted to the hospital prior to having the procedure at [facility].  The patient's procedure was set up for October.  The patient had a upper GI evaluation with a barium swallow with the 13 mm pill progressing slowly which was written as concerning for a distal esophageal stricture.  There was also seen that there was esophageal dysmotility with tertiary contractions.  The patient was sent by her PCP to come to the hospital because of dehydration although the patient's labs showed her creatinine and BUN to be not consistent with dehydration.  The patient also has chronic constipation with bloating and abdominal distention.  The patient takes MiraLAX as needed for this.  She also reports that she has a history of Candida infections in her mouth. I am now being asked to see the patient for possible endoscopy for her dysphagia.  Past Medical History:  Diagnosis Date   Arthritis    rheumatoid   Complication of anesthesia    COPD (chronic obstructive pulmonary disease) (HCC)    Hypertension    Osteoporosis    PONV (postoperative nausea and vomiting)    Pulmonary hypertension (HCC)    Rheumatoid arthritis(714.0)    ?  scleroderma    Past Surgical History:  Procedure Laterality Date   colonosc neg  11/08   COLONOSCOPY WITH PROPOFOL  N/A 09/30/2016   Procedure: COLONOSCOPY WITH PROPOFOL ;  Surgeon: Ruel Therisa, MD;  Location: [facility] ENDOSCOPY;  Service: Endoscopy;  Laterality: N/A;   COLONOSCOPY WITH PROPOFOL  N/A 12/08/2017   Procedure: COLONOSCOPY WITH PROPOFOL ;  Surgeon: Therisa Ruel, MD;  Location: [facility] ENDOSCOPY;  Service: Gastroenterology;  Laterality: N/A;   Dexa  07/2005   OP hip- 3.0   ESOPHAGOGASTRODUODENOSCOPY (EGD) WITH PROPOFOL  N/A 07/01/2016   Procedure: ESOPHAGOGASTRODUODENOSCOPY (EGD) WITH PROPOFOL ;  Surgeon: Ruel Therisa, MD;  Location: [facility] ENDOSCOPY;  Service: Endoscopy;  Laterality: N/A;   FLEXIBLE SIGMOIDOSCOPY N/A 07/01/2016   Procedure: FLEXIBLE SIGMOIDOSCOPY;  Surgeon: Ruel Therisa, MD;  Location: [facility] ENDOSCOPY;  Service: Endoscopy;  Laterality: N/A;   LEEP  2005   OPEN REDUCTION INTERNAL FIXATION (ORIF) DISTAL RADIAL FRACTURE Left 05/03/2022   Procedure: OPEN REDUCTION INTERNAL FIXATION (ORIF) DISTAL RADIUS FRACTURE;  Surgeon: Edie Norleen PARAS, MD;  Location: [facility] ORS;  Service: Orthopedics;  Laterality: Left;   Surgery- tendon release in hand  2006   TUBAL LIGATION      Prior to Admission medications   Medication Sig Start Date End Date Taking? Authorizing Provider  albuterol  (VENTOLIN  HFA) 108 (90 Base) MCG/ACT inhaler INHALE 2 PUFFS INTO THE LUNGS EVERY 6 HOURS AS NEEDED FOR  WHEEZING OR SHORTNESS OF BREATH. 12/08/21  Yes Tamea Dedra CROME, MD  amLODipine  (NORVASC ) 5 MG tablet Take 1 tablet (5 mg total) by mouth daily. 11/04/20  Yes Tower, Laine LABOR, MD  Aspirin  81 MG EC tablet Take 81 mg by mouth daily.   Yes [provider]  etanercept (ENBREL) 50 MG/ML injection Inject 50 mg into the skin once a week.   Yes [provider]  Glycopyrrolate -Formoterol (BEVESPI AEROSPHERE) 9-4.8 MCG/ACT AERO Inhale 2 puffs into the lungs 2 (two) times daily.   Yes [provider]   hydrochlorothiazide  (HYDRODIURIL ) 25 MG tablet Take 1 tablet (25 mg total) by mouth daily. 11/04/20  Yes Tower, Laine LABOR, MD  hydroxychloroquine  (PLAQUENIL ) 200 MG tablet Take 200 mg by mouth daily.   Yes [provider]  Multiple Vitamin (MULTIVITAMIN) capsule Take 1 capsule by mouth daily.   Yes [provider]  sulfaSALAzine  (AZULFIDINE ) 500 MG tablet Take 1 tablet (500 mg total) by mouth 3 (three) times daily. 09/21/23  Yes Therisa Bi, MD  B Complex Vitamins (VITAMIN-B COMPLEX) TABS Take 1 tablet by mouth daily. Patient not taking: Reported on 01/09/2024 07/13/22   [provider]  Cholecalciferol (VITAMIN D3) 1.25 MG (50000 UT) TABS  11/22/20   [provider]  OXYGEN  Inhale 2 L into the lungs as needed.    [provider]    Family History  Problem Relation Age of Onset   Heart disease Mother    Diabetes Mother    Hypertension Father    Macular degeneration Father    Dementia Father        early   Arthritis Father        RA   Alcohol abuse Father    Breast cancer Sister 84     Social History   Tobacco Use   Smoking status: Former    Current packs/day: 0.00    Average packs/day: 1.3 packs/day for 30.0 years (37.5 ttl pk-yrs)    Types: Cigarettes    Start date: 54    Quit date: 2006    Years since quitting: 19.5   Smokeless tobacco: Never   Tobacco comments:    Quit in 8/07  Vaping Use   Vaping status: Never Used  Substance Use Topics   Alcohol use: No    Alcohol/week: 0.0 standard drinks of alcohol   Drug use: No    Allergies as of 01/09/2024   (No Known Allergies)    Review of Systems:    All systems reviewed and negative except where noted in HPI.   Physical Exam:  Vital signs in last 24 hours: Temp:  [97.6 F (36.4 C)-98.1 F (36.7 C)] 97.6 F (36.4 C) (07/09 0834) Pulse Rate:  [65-80] 72 (07/09 0834) Resp:  [16] 16 (07/09 0834) BP: (113-131)/(66-83) 131/78 (07/09 0834) SpO2:  [95 %-100 %] 100 % (07/09  0834) Last BM Date : 01/09/24 General:   Pleasant, cooperative in NAD Head:  Normocephalic and atraumatic. Eyes:   No icterus.   Conjunctiva pink. PERRLA. Ears:  Normal auditory acuity. Neck:  Supple; no masses or thyroidomegaly Rectal:  Not performed. Neurologic:  Alert and oriented x3;  grossly normal neurologically. Skin:  Intact without significant lesions or rashes. Psych:  Alert and cooperative. Normal affect.  LAB RESULTS: Recent Labs    01/09/24 1022 01/10/24 0356  WBC 4.8 3.8*  HGB 14.0 11.7*  HCT 41.8 34.3*  PLT 242 182   BMET Recent Labs    01/09/24 1022  01/10/24 0356  NA 138 138  K 4.1 3.2*  CL 96* 102  CO2 31 30  GLUCOSE 103* 78  BUN 11 8  CREATININE 0.52 0.43*  CALCIUM 9.9 9.0   LFT Recent Labs    01/09/24 1022  PROT 6.1*  ALBUMIN 4.1  AST 35  ALT 24  ALKPHOS 47  BILITOT 1.3*   PT/INR No results for input(s): LABPROT, INR in the last 72 hours.  STUDIES: No results found.    Impression / Plan:   Assessment: Principal Problem:   Failure to thrive in adult Active Problems:   Essential hypertension   Rheumatoid arthritis (HCC)   Pulmonary hypertension (HCC)   Constipation   Underweight   Ulcerative colitis (HCC)   COPD, very severe (HCC)   Chronic respiratory failure with hypoxia (HCC)   Carrie Hendricks is a 73 y.o. y/o female with a barium swallow that showed esophageal dysmotility with tertiary contractions and concerning for distal esophageal stricture.  The patient states that she is losing weight because she has no energy from not eating and therefore is too tired to get up and make food.  The patient was admitted with dehydration but none of the labs supported the patient's diagnosis of dehydration.  The patient has been seen by Dr. Therisa who recommended the patient not undergo any endoscopic procedures due to her chronic lung disease and subsequently was seen at [facility] who also agree that outpatient endoscopy for this patient  even at their center would not be wise and that she should be admitted to [facility] for an inpatient procedure.  Plan:  I agree that this patient is high risk for any endoscopic procedures especially at our hospital and should be seen and scoped at a tertiary care center.  The patient is concerned about Candida as a cause of her trouble swallowing and I have told them that an empirical course of Diflucan  for 10 days could be prescribed.  The patient has also been told that she should take her MiraLAX daily because the constipation can be contributing to her bloating and abdominal discomfort.  She has also been told to take boost or Ensure to supplement her calories so that she has the energy to make food and to continue on a soft diet.  The patient and her family have been explained the plan and agree with it.  Thank you for involving me in the care of this patient.      LOS: 0 days   Carrie Copping, MD, MD. NOLIA 01/10/2024, 2:00 PM,  Pager [phone] 7am-5pm  Check AMION for 5pm -7am coverage and on weekends   Note: This dictation was prepared with Dragon dictation along with smaller phrase technology. Any transcriptional errors that result from this process are unintentional.

## 2024-01-10 NOTE — Discharge Summary (Signed)
 Physician Discharge Summary   Patient: Carrie Hendricks MRN: [id number]  DOB: 12-Feb-1951   Admit:     Date of Admission: 01/09/2024 Admitted from: home   Discharge: Date of discharge: 01/10/24 Disposition: Home Condition at discharge: good  CODE STATUS: DNR     Discharge Physician: Laneta Blunt, DO [facility]     PCP: Sherial Bail, MD  Recommendations for Outpatient Follow-up:  Follow up with PCP Sherial Bail, MD in 1-2 weeks Follow up as directed w/ GI at [facility] - advised call sooner to see about expediting     Discharge Instructions     Increase activity slowly   Complete by: As directed          Discharge Diagnoses: Principal Problem:   Failure to thrive in adult Active Problems:   Essential hypertension   Rheumatoid arthritis (HCC)   Pulmonary hypertension (HCC)   Constipation   Underweight   Ulcerative colitis (HCC)   COPD, very severe (HCC)   Chronic respiratory failure with hypoxia (HCC)   Esophageal dysphagia     Hospital course / significant events: Patient with PMH of severe COPD, on 2-3 LPM, pulmonary hypertension, chronic respiratory failure, HTN, ulcerative colitis, rheumatoid arthritis, underweight presented to the hospital with complaints of difficulty swallowing, swelling and numbness in her legs and progressively worsening fatigue. Sent to ED from PCP concern for FTT. Pt was admitted w/ FTT and dysphagia. Etiology likely multifactorial - likely esophageal dysmotility, esophageal stricture, DDx also include esophageal candidiasis, decreased appetite after steroid discontinuation, progression of ILD and pulmonary hypertension, mood disorder with somatic symptoms, IBS C, occult malignancy. Appreciate dietary and GI consult - this patient is high risk for any endoscopic procedures especially at our hospital, defer procedures for now and advised f/u at [facility] for procedure w/ tertiary care      Consultants:   Gastroenterology.   Procedures/Surgeries: none      ASSESSMENT & PLAN:   Dysphagia  Etiology likely multifactorial - likely esophageal dysmotility, esophageal stricture, DDx also include esophageal candidiasis, decreased appetite after steroid discontinuation, progression of ILD and pulmonary hypertension, mood disorder with somatic symptoms, IBS C, occult malignancy.  Follow w/ [facility] Empiric tx diflucan  for possible esophageal candida  Boost for supplementation PPI initiated  Continue Miralax   Fatigue. Severe COPD.  ILD Chronic respiratory failure on 2 through 3 LPM oxygen . Pulmonary hypertension Continue home meds Follow outpatient   LE discomfort Dc amlodipine    Ulcerative colitis. Current and active symptoms.  Last colonoscopy was also negative for any active colitis. Recently seen in the ED for hemorrhoids Continue sulfasalazine .  Continue aspirin .   Concern for mood disorder. May benefit from addition of medicine like Remeron for both appetite stimulation, treatment for mood disorder. For now monitor.   underweight based on BMI: Body mass index is 16.29 kg/m.SABRA Significantly low or high BMI is associated with higher medical risk.  Underweight - under 18  overweight - 25 to 29 obese - 30 or more Class 1 obesity: BMI of 30.0 to 34 Class 2 obesity: BMI of 35.0 to 39 Class 3 obesity: BMI of 40.0 to 49 Super Morbid Obesity: BMI 50-59 Super-super Morbid Obesity: BMI 60+ Healthy nutrition and physical activity advised as adjunct to other disease management and risk reduction treatments            Discharge Instructions  Allergies as of 01/10/2024   No Known Allergies      Medication List     STOP  taking these medications    amLODipine  5 MG tablet Commonly known as: NORVASC        TAKE these medications    albuterol  108 (90 Base) MCG/ACT inhaler Commonly known as: VENTOLIN  HFA INHALE 2 PUFFS INTO THE LUNGS EVERY 6 HOURS AS NEEDED FOR  WHEEZING OR SHORTNESS OF BREATH.   Aspirin  81 MG EC tablet Take 81 mg by mouth daily.   Bevespi Aerosphere 9-4.8 MCG/ACT Aero Generic drug: Glycopyrrolate -Formoterol Inhale 2 puffs into the lungs 2 (two) times daily.   etanercept 50 MG/ML injection Commonly known as: ENBREL Inject 50 mg into the skin once a week.   fluconazole  100 MG tablet Commonly known as: Diflucan  Take 1 tablet (100 mg total) by mouth daily for 10 days.   hydrochlorothiazide  25 MG tablet Commonly known as: HYDRODIURIL  Take 1 tablet (25 mg total) by mouth daily.   hydroxychloroquine  200 MG tablet Commonly known as: PLAQUENIL  Take 200 mg by mouth daily.   multivitamin capsule Take 1 capsule by mouth daily.   ondansetron  4 MG tablet Commonly known as: ZOFRAN  Take 1 tablet (4 mg total) by mouth every 6 (six) hours as needed for nausea.   OXYGEN  Inhale 2 L into the lungs as needed.   pantoprazole  40 MG tablet Commonly known as: PROTONIX  Take 1 tablet (40 mg total) by mouth daily. Start taking on: January 11, 2024   sulfaSALAzine  500 MG tablet Commonly known as: AZULFIDINE  Take 1 tablet (500 mg total) by mouth 3 (three) times daily.   Vitamin D3 1.25 MG (50000 UT) Tabs   Vitamin-B Complex Tabs Take 1 tablet by mouth daily.          No Known Allergies   Subjective: pt reports no abd pain at this time, persistent dysphagia but tolerating diet    Discharge Exam: BP 131/78 (BP Location: Left Arm)   Pulse 72   Temp 97.6 F (36.4 C)   Resp 16   Ht 5' 7 (1.702 m)   Wt 47.2 kg   SpO2 100%   BMI 16.29 kg/m  General: Pt is alert, awake, not in acute distress, thin/frail Cardiovascular: RRR Respiratory: CTA bilaterally Abdominal: Soft Extremities: no edema, no cyanosis     The results of significant diagnostics from this hospitalization (including imaging, microbiology, ancillary and laboratory) are listed below for reference.     Microbiology: No results found for this or any  previous visit (from the past 240 hours).   Labs: BNP (last 3 results) No results for input(s): BNP in the last 8760 hours. Basic Metabolic Panel: Recent Labs  Lab 01/09/24 1022 01/10/24 0356  NA 138 138  K 4.1 3.2*  CL 96* 102  CO2 31 30  GLUCOSE 103* 78  BUN 11 8  CREATININE 0.52 0.43*  CALCIUM 9.9 9.0   Liver Function Tests: Recent Labs  Lab 01/09/24 1022  AST 35  ALT 24  ALKPHOS 47  BILITOT 1.3*  PROT 6.1*  ALBUMIN 4.1   Recent Labs  Lab 01/09/24 1022  LIPASE 31   No results for input(s): AMMONIA in the last 168 hours. CBC: Recent Labs  Lab 01/09/24 1022 01/10/24 0356  WBC 4.8 3.8*  HGB 14.0 11.7*  HCT 41.8 34.3*  MCV 103.7* 103.0*  PLT 242 182   Cardiac Enzymes: No results for input(s): CKTOTAL, CKMB, CKMBINDEX, TROPONINI in the last 168 hours. BNP: Invalid input(s): POCBNP CBG: No results for input(s): GLUCAP in the last 168 hours. D-Dimer No results for input(s): DDIMER in the  last 72 hours. Hgb A1c No results for input(s): HGBA1C in the last 72 hours. Lipid Profile No results for input(s): CHOL, HDL, LDLCALC, TRIG, CHOLHDL, LDLDIRECT in the last 72 hours. Thyroid  function studies Recent Labs    01/10/24 0356  TSH 4.859*   Anemia work up Recent Labs    01/10/24 0356  VITAMINB12 707   Urinalysis    Component Value Date/Time   COLORURINE AMBER (A) 01/09/2024 1210   APPEARANCEUR CLEAR (A) 01/09/2024 1210   LABSPEC 1.021 01/09/2024 1210   PHURINE 6.0 01/09/2024 1210   GLUCOSEU NEGATIVE 01/09/2024 1210   HGBUR NEGATIVE 01/09/2024 1210   BILIRUBINUR NEGATIVE 01/09/2024 1210   BILIRUBINUR negative 06/08/2016 1408   KETONESUR 5 (A) 01/09/2024 1210   PROTEINUR NEGATIVE 01/09/2024 1210   UROBILINOGEN negative 06/08/2016 1408   NITRITE NEGATIVE 01/09/2024 1210   LEUKOCYTESUR NEGATIVE 01/09/2024 1210   Sepsis Labs Recent Labs  Lab 01/09/24 1022 01/10/24 0356  WBC 4.8 3.8*   Microbiology No  results found for this or any previous visit (from the past 240 hours). Imaging No results found.    Time coordinating discharge: over 30 minutes  SIGNED:  Brexton Sofia DO [facility]

## 2024-01-10 NOTE — Evaluation (Signed)
 Physical Therapy Evaluation Patient Details Name: Carrie Hendricks MRN: [id number] DOB: September 14, 1950 Today's Date: 01/10/2024  History of Present Illness  Pt is a 73 y.o. female whose medical history includes ulcerative colitis and COPD on supplemental oxygen  of 2 to 3 L at all times presented to the emergency department for evaluation of failure to thrive. MD assessment includes failure to thrive with dysphagia, LE swelling with numbness, fatigue, pulmonary HTN, regular arthritis, and mood disorder.  Clinical Impression  Pt was pleasant and motivated to participate during the session and put forth good effort throughout. Pt remained steady throughout ambulation and no LOBs occurred. Graded ambulation was performed during today's session and pt's SPO2% readings throughout session were as follows: 94% in supine, 94% in seated EOB, 90% after ambulating 15 feet, and 84% after ambulating 60 feet. After second bout of ambulation, pt's SPO2% rebounded to WNL after 1 minute of sitting with cues for PLB. Pt will benefit from continued PT services upon discharge to safely address deficits listed in patient problem list for decreased caregiver assistance and eventual return to PLOF.          If plan is discharge home, recommend the following: A little help with walking and/or transfers;A little help with bathing/dressing/bathroom;Assistance with cooking/housework;Assist for transportation   Can travel by private vehicle        Equipment Recommendations None recommended by PT  Recommendations for Other Services       Functional Status Assessment Patient has had a recent decline in their functional status and demonstrates the ability to make significant improvements in function in a reasonable and predictable amount of time.     Precautions / Restrictions Precautions Precautions: None Restrictions Weight Bearing Restrictions Per Provider Order: No      Mobility  Bed Mobility Overal bed mobility:  Independent             General bed mobility comments: Pt was independent with coming to sitting EOB from supine without requiring physical assistance or use of bedrails    Transfers Overall transfer level: Needs assistance Equipment used: Rolling walker (2 wheels) Transfers: Sit to/from Stand Sit to Stand: Contact guard assist           General transfer comment: Pt required CGA for STS from EOB with RW, but remained steady throughout and no LOBs occured    Ambulation/Gait Ambulation/Gait assistance: Contact guard assist Gait Distance (Feet): 15 Feet x 1, 60 feet x 1 Assistive device: Rolling walker (2 wheels) Gait Pattern/deviations: Step-through pattern, Decreased step length - right, Decreased step length - left Gait velocity: decreased     General Gait Details: Pt reliant on RW to maintain balance during ambulation. Pt demonstrated minimal step length bilaterally, but remained steady throughout and no LOBs occured.  Stairs            Wheelchair Mobility     Tilt Bed    Modified Rankin (Stroke Patients Only)       Balance Overall balance assessment: Needs assistance Sitting-balance support: No upper extremity supported, Feet supported Sitting balance-Leahy Scale: Normal     Standing balance support: Bilateral upper extremity supported, During functional activity, min support from RW during ambulation Standing balance-Leahy Scale: Fair Standing balance comment: Pt self-selected use of RW during session, but remained steady throughout and no LOBs occurred.                              Pertinent  Vitals/Pain Pain Assessment Pain Assessment: 0-10 Pain Score: 7  Pain Location: Bilat hands and low back Pain Descriptors / Indicators: Constant, Pressure Pain Intervention(s): Monitored during session    Home Living Family/patient expects to be discharged to:: Private residence Living Arrangements: Alone Available Help at Discharge:  Family;Available PRN/intermittently Type of Home: House Home Access: Level entry       Home Layout: One level Home Equipment: Shower seat - built in;Cane - single point      Prior Function Prior Level of Function : Independent/Modified Independent             Mobility Comments: Pt reported being independent with limited community distances without use of AD. Pt reported 0 falls in the past 6 months. ADLs Comments: Pt reported being independent with ADLs without use of AD.     Extremity/Trunk Assessment   Upper Extremity Assessment Upper Extremity Assessment: Overall WFL for tasks assessed    Lower Extremity Assessment Lower Extremity Assessment: Overall WFL for tasks assessed       Communication   Communication Communication: No apparent difficulties    Cognition Arousal: Alert Behavior During Therapy: WFL for tasks assessed/performed   PT - Cognitive impairments: No apparent impairments                         Following commands: Intact       Cueing Cueing Techniques: Verbal cues, Gestural cues     General Comments General comments (skin integrity, edema, etc.): sp02 dropped to 84% on 3L with ADLs, however pt recoverd to 93% with seated rest break ~5min    Exercises     Assessment/Plan    PT Assessment Patient needs continued PT services  PT Problem List Decreased strength;Decreased mobility;Decreased range of motion;Decreased activity tolerance;Cardiopulmonary status limiting activity;Pain       PT Treatment Interventions DME instruction;Therapeutic exercise;Gait training;Balance training;Functional mobility training;Therapeutic activities;Patient/family education    PT Goals (Current goals can be found in the Care Plan section)  Acute Rehab PT Goals Patient Stated Goal: to be able to go visit family members PT Goal Formulation: With patient Time For Goal Achievement: 01/23/24 Potential to Achieve Goals: Fair    Frequency Min  2X/week     Co-evaluation               AM-PAC PT 6 Clicks Mobility  Outcome Measure Help needed turning from your back to your side while in a flat bed without using bedrails?: None Help needed moving from lying on your back to sitting on the side of a flat bed without using bedrails?: None Help needed moving to and from a bed to a chair (including a wheelchair)?: A Little Help needed standing up from a chair using your arms (e.g., wheelchair or bedside chair)?: A Little Help needed to walk in hospital room?: A Little Help needed climbing 3-5 steps with a railing? : A Lot 6 Click Score: 19    End of Session Equipment Utilized During Treatment: Gait belt;Oxygen  Activity Tolerance: Patient limited by fatigue Patient left: in chair;with call bell/phone within reach;with chair alarm set;with family/visitor present Nurse Communication: Mobility status PT Visit Diagnosis: Other abnormalities of gait and mobility (R26.89);Difficulty in walking, not elsewhere classified (R26.2);Pain Pain - Right/Left:  (bilat hands and low back)    Time: 8965-8895 PT Time Calculation (min) (ACUTE ONLY): 30 min   Charges:                 Leontine  Ledell Codrington, SPT 01/10/24, 1:58 PM

## 2024-01-10 NOTE — Evaluation (Signed)
 Occupational Therapy Evaluation Patient Details Name: Carrie Hendricks MRN: [id number] DOB: 12/06/1950 Today's Date: 01/10/2024   History of Present Illness   Pt is a 73 y.o. female whose medical history includes ulcerative colitis and COPD on supplemental oxygen  of 2 to 3 L at all times presented to the emergency department for evaluation of failure to thrive. MD assessment includes failure to thrive with dysphagia, fatigue, pulmonary HTN, regular arthritis, and mood disorder.     Clinical Impressions Pt was seen for OT evaluation this date. PTA, pt resides in a one level home with level entry alone. Pt was a limited community ambulatory without AD and IND with ADLs/IADLs at baseline. Daughters are supportive and available.  Pt presents to acute OT demonstrating little to no functional decline. She is weak and fatigues easily, however is currently able to perform her ADLs at supervision level with no LOB ambulating within the room without AD. OT assisted with portable 02 tank management for energy conservation purposes, but pt typically manages this on her own at home. She has a concentrator in her room she uses at night and is otherwise using her portable tanks through the home. She was on 3L on entry with desat to 84% after toileting and standing grooming tasks, but improved to 93% with 1 min of seated rest. Pt does not have a need for acute OT services and likely will not need any OT follow up upon DC.    If plan is discharge home, recommend the following:         Functional Status Assessment   Patient has not had a recent decline in their functional status     Equipment Recommendations   None recommended by OT     Recommendations for Other Services         Precautions/Restrictions   Precautions Precautions: None Recall of Precautions/Restrictions: Intact Restrictions Weight Bearing Restrictions Per Provider Order: No     Mobility Bed Mobility                General bed mobility comments: left seated EOB with daughter's present at end of session    Transfers Overall transfer level: Needs assistance Equipment used: None Transfers: Sit to/from Stand Sit to Stand: Supervision           General transfer comment: supervision for all mobility and transfers during session from bathroom to bed, assist for management of portable 02 tank      Balance Overall balance assessment: No apparent balance deficits (not formally assessed)   Sitting balance-Leahy Scale: Normal                                     ADL either performed or assessed with clinical judgement   ADL Overall ADL's : Needs assistance/impaired     Grooming: Oral care;Standing;Supervision/safety                   Toilet Transfer: Supervision/safety;Regular Archivist- Clothing Manipulation and Hygiene: Supervision/safety         General ADL Comments: pt found in bathroom with daughter, supervision level for all tasks and performed standing grooming at sink without LOB     Vision         Perception         Praxis         Pertinent Vitals/Pain Pain Assessment Pain Assessment: Faces Faces Pain Scale: No  hurt Pain Intervention(s): Monitored during session     Extremity/Trunk Assessment Upper Extremity Assessment Upper Extremity Assessment: Overall WFL for tasks assessed   Lower Extremity Assessment Lower Extremity Assessment: Overall WFL for tasks assessed       Communication Communication Communication: No apparent difficulties   Cognition Arousal: Alert Behavior During Therapy: WFL for tasks assessed/performed                                 Following commands: Intact       Cueing  General Comments   Cueing Techniques: Verbal cues;Gestural cues  sp02 dropped to 84% on 3L with ADLs, however pt recoverd to 93% with seated rest break ~67min   Exercises Other Exercises Other Exercises: Edu on  role of OT and ways to incorporate protein into diet via liquids to maximize energy levels and strength, also edu on compensatory strategies and ways to use RW to manage 02 and use less energy. Pt and daughters verbalized understanding.   Shoulder Instructions      Home Living Family/patient expects to be discharged to:: Private residence Living Arrangements: Alone Available Help at Discharge: Family;Available PRN/intermittently Type of Home: House Home Access: Level entry     Home Layout: One level     Bathroom Shower/Tub: Producer, television/film/video: Standard     Home Equipment: Shower seat - built in;Cane - single point          Prior Functioning/Environment Prior Level of Function : Independent/Modified Independent             Mobility Comments: Pt reported being independent with limited community distances without use of AD. Pt reported 0 falls in the past 6 months. ADLs Comments: Pt reported being independent with ADLs without use of AD.    OT Problem List: Decreased activity tolerance   OT Treatment/Interventions:        OT Goals(Current goals can be found in the care plan section)       OT Frequency:       Co-evaluation              AM-PAC OT 6 Clicks Daily Activity     Outcome Measure Help from another person eating meals?: None Help from another person taking care of personal grooming?: None Help from another person toileting, which includes using toliet, bedpan, or urinal?: None Help from another person bathing (including washing, rinsing, drying)?: None Help from another person to put on and taking off regular upper body clothing?: None Help from another person to put on and taking off regular lower body clothing?: None 6 Click Score: 24   End of Session Equipment Utilized During Treatment: Oxygen  Nurse Communication: Mobility status  Activity Tolerance: Patient tolerated treatment well Patient left: in bed;with call bell/phone  within reach;with family/visitor present  OT Visit Diagnosis: Other abnormalities of gait and mobility (R26.89)                Time: 8848-8785 OT Time Calculation (min): 23 min Charges:  OT General Charges $OT Visit: 1 Visit OT Evaluation $OT Eval Low Complexity: 1 Low OT Treatments $Self Care/Home Management : 8-22 mins  Mayer Vondrak, OTR/L 01/10/24, 1:14 PM  [place] Hy E Roni Scow 01/10/2024, 1:12 PM

## 2024-03-05 ENCOUNTER — Other Ambulatory Visit: Payer: Self-pay
